# Patient Record
Sex: Female | Born: 1955 | Race: White | Hispanic: No | Smoking: Former smoker
Health system: Southern US, Community
[De-identification: ages and names within clinical notes are randomized; demographics above are authoritative.]

## PROBLEM LIST (undated history)

## (undated) DIAGNOSIS — Z8701 Personal history of pneumonia (recurrent): Secondary | ICD-10-CM

## (undated) DIAGNOSIS — I10 Essential (primary) hypertension: Secondary | ICD-10-CM

## (undated) DIAGNOSIS — K219 Gastro-esophageal reflux disease without esophagitis: Secondary | ICD-10-CM

## (undated) DIAGNOSIS — F419 Anxiety disorder, unspecified: Secondary | ICD-10-CM

## (undated) DIAGNOSIS — M1990 Unspecified osteoarthritis, unspecified site: Secondary | ICD-10-CM

## (undated) DIAGNOSIS — J449 Chronic obstructive pulmonary disease, unspecified: Secondary | ICD-10-CM

## (undated) DIAGNOSIS — R519 Headache, unspecified: Secondary | ICD-10-CM

## (undated) DIAGNOSIS — C3411 Malignant neoplasm of upper lobe, right bronchus or lung: Secondary | ICD-10-CM

## (undated) DIAGNOSIS — J42 Unspecified chronic bronchitis: Secondary | ICD-10-CM

## (undated) DIAGNOSIS — R51 Headache: Secondary | ICD-10-CM

## (undated) DIAGNOSIS — J189 Pneumonia, unspecified organism: Secondary | ICD-10-CM

## (undated) HISTORY — DX: Essential (primary) hypertension: I10

## (undated) HISTORY — PX: HERNIA REPAIR: SHX51

## (undated) HISTORY — DX: Personal history of pneumonia (recurrent): Z87.01

## (undated) HISTORY — DX: Malignant neoplasm of upper lobe, right bronchus or lung: C34.11

## (undated) HISTORY — PX: APPENDECTOMY: SHX54

## (undated) HISTORY — DX: Pneumonia, unspecified organism: J18.9

## (undated) HISTORY — PX: TUBAL LIGATION: SHX77

## (undated) HISTORY — PX: CARPAL TUNNEL RELEASE: SHX101

## (undated) HISTORY — PX: ABDOMINAL HYSTERECTOMY: SHX81

---

## 2016-07-26 ENCOUNTER — Ambulatory Visit (INDEPENDENT_AMBULATORY_CARE_PROVIDER_SITE_OTHER): Payer: Medicaid Other | Admitting: Pulmonary Disease

## 2016-07-26 ENCOUNTER — Other Ambulatory Visit: Payer: Self-pay

## 2016-07-26 ENCOUNTER — Encounter: Payer: Self-pay | Admitting: Pulmonary Disease

## 2016-07-26 ENCOUNTER — Encounter (INDEPENDENT_AMBULATORY_CARE_PROVIDER_SITE_OTHER): Payer: Self-pay

## 2016-07-26 VITALS — BP 112/72 | HR 91 | Ht 60.0 in | Wt 114.0 lb

## 2016-07-26 DIAGNOSIS — R222 Localized swelling, mass and lump, trunk: Secondary | ICD-10-CM

## 2016-07-26 DIAGNOSIS — R918 Other nonspecific abnormal finding of lung field: Secondary | ICD-10-CM

## 2016-07-26 DIAGNOSIS — J432 Centrilobular emphysema: Secondary | ICD-10-CM

## 2016-07-26 DIAGNOSIS — Z720 Tobacco use: Secondary | ICD-10-CM

## 2016-07-26 NOTE — Assessment & Plan Note (Signed)
Large 7cm hilar mass with 2 cm peritracheal node and 2 cm subcarinal node. Plan: Plan: It is good to meet you today  We will schedule you for a PET scan once you have completed your antibiotic treatment. Continue your Levaquin until gone. Continue using your Pro Air Inhaler for shortness of breath or wheezing as needed up to every 6 hours. Continue to work on quitting smoking. Follow up with Dr. Halford Chessman in  4 weeks with Dr. Halford Chessman or Aveleen Nevers Please contact office for sooner follow up if symptoms do not improve or worsen or seek emergency care

## 2016-07-26 NOTE — Patient Instructions (Addendum)
It is good to meet you today  We will schedule you for a PET scan once you have completed your antibiotic treatment. Continue your Levaquin until gone. Continue using your Pro Air Inhaler for shortness of breath or wheezing as needed up to every 6 hours. Continue to work on quitting smoking. Follow up with Dr. Halford Chessman in  4 weeks with Dr. Halford Chessman or Tamyrah Burbage Please contact office for sooner follow up if symptoms do not improve or worsen or seek emergency care

## 2016-07-26 NOTE — Progress Notes (Signed)
Subjective:    Patient ID: Emily Pope, female    DOB: 1956/02/26, 60 y.o.   MRN: [id number]   Pt. Is a 60 year old thin female current smoker with a 63 pack year history. She is currently smoking 1 PPD.   HPI: Pt. Presents to the office today for consultation for a large 7 cm Hilar pulmonary mass, a 2 cm peri tracheal node, and a 2 cm sub carinal node noted on CT scan 07/22/2016. She was also diagnosed 07/20/16 with post obstructive pneumonia.She was diagnosed with COPD by her PCP about 1.5 years ago.She stated that she had a spontaneous pneumothorax in her 62's. She has  had frequent bouts of bronchitis that have been  treated with steroids and antibiotics. She has been maintained on  Dynegy with occasional use of Breo or Advair during  acute flares.. She became ill last Tuesday 07/20/16, and went to see her PCP. She sent her for a CXR on Wednesday, which indicated pneumonia. She was also sent for a CT Chest which indicated the large 7 cm pulmonary mass. She was started on Levaquin 07/20/2016, and will continue to take this for 10 days. She has a cough that is productive for creamy yellow secretions. She states she has a low grade fever. She states she does have chest pain on inhalation. She can sleep on her left side only. She has not experienced hemoptysis. We will schedule her for a PET scan and refer her for EBUS with Dr. Lamonte Sakai.    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 She does not have insurance as of Wednesday and is very concerned about this in regard to her future  care needs.       Review of Systems  Constitutional: Positive for fever and unexpected weight change.  HENT: Positive for congestion, postnasal drip, rhinorrhea and sore throat. Negative for dental problem, ear pain, nosebleeds, sinus pressure, sneezing and trouble swallowing.   Eyes: Negative.  Negative for redness and itching.  Respiratory: Positive for cough, chest tightness, shortness of breath and  wheezing.   Cardiovascular: Negative.  Negative for palpitations and leg swelling.  Gastrointestinal: Negative.  Negative for nausea and vomiting.  Endocrine: Negative.   Genitourinary: Negative.  Negative for dysuria.  Musculoskeletal: Positive for arthralgias, back pain and neck pain. Negative for joint swelling.  Skin: Negative.  Negative for rash.  Allergic/Immunologic: Positive for environmental allergies.  Neurological: Negative.  Negative for headaches.  Hematological: Negative.  Does not bruise/bleed easily.  Psychiatric/Behavioral: Negative.  Negative for dysphoric mood. The patient is not nervous/anxious.    Family History of Brain cancer with metastasis to the lung. ( Mother) + Smoker Family History of Lung Cancer in  Brother + smoker. Family History of diabetes ( Grandparents)  Social History   Social History  . Marital status: Single    Spouse name: N/A  . Number of children: N/A  . Years of education: N/A   Social History Main Topics  . Smoking status: Former Smoker    Packs/day: 2.00    Years: 43.00    Quit date: 07/20/2016  . Smokeless tobacco: None  . Alcohol use None  . Drug use: Unknown  . Sexual activity: Not Asked   Other Topics Concern  . None   Social History Narrative  . None       Objective:   Physical Exam  General- No distress,  A&Ox3, thin, anxious ENT: No sinus tenderness, TM clear,  pale nasal mucosa, no oral exudate,no post nasal drip, no LAN Cardiac: S1, S2, regular rate and rhythm, no murmur Chest: No wheeze/ rales/ dullness; no accessory muscle use, no nasal flaring, no sternal retractions Abd.: Soft Non-tender Ext: No clubbing cyanosis, edema Neuro:  normal strength Skin: No rashes, warm and dry Psych: normal mood and behavior        Assessment & Plan:   Large 7cm hilar mass with 2 cm peritracheal node and 2 cm subcarinal node. Plan: Plan: It is good to meet you today  We will schedule you for a PET scan once you have  completed your antibiotic treatment. Continue your Levaquin until gone. Continue using your Pro Air Inhaler for shortness of breath or wheezing as needed up to every 6 hours. Continue to work on quitting smoking. Follow up with Dr. Halford Chessman in  4 weeks with Dr. Halford Chessman or Sarah Please contact office for sooner follow up if symptoms do not improve or worsen or seek emergency care     Attending note: 60 yo female smoker developed productive cough and right sided pleuritic chest pain.  She had CXR and CT chest which showed Rt upper lung mass and infiltrate with paratracheal and subcarinal LAN.  She has few more days of Abx left.    Thin.  Poor dentition.  MP 2.  No LAN.  HR regular.  Scattered crackles Rt upper lung region, no wheeze.  Abd soft.  No edema.  Normal strength.  CN intact.  Assessment/plan:  Rt upper lung mass with post-obstructive pneumonia and mediastinal LAN. - complete Abx - arrange for PET scan and then determine best approach for biopsy >> likely EBUS with ENB  Emphysema. - will need PFTs when pneumonia improved - continue prn albuterol for now  Tobacco abuse. - she quit smoking on 07/20/16 >> encouraged her to maintain smoking cessation  Chesley Mires, MD [place] 07/26/2016, 4:51 PM Pager:  [phone] After 3pm call: [phone]

## 2016-08-04 ENCOUNTER — Encounter (HOSPITAL_COMMUNITY)
Admission: RE | Admit: 2016-08-04 | Discharge: 2016-08-04 | Disposition: A | Discharge status: Home / Self Care | Payer: Medicaid Other | Source: Ambulatory Visit | Attending: Pulmonary Disease | Admitting: Pulmonary Disease

## 2016-08-04 ENCOUNTER — Encounter (HOSPITAL_COMMUNITY): Payer: Self-pay | Admitting: Radiology

## 2016-08-04 DIAGNOSIS — R222 Localized swelling, mass and lump, trunk: Secondary | ICD-10-CM | POA: Insufficient documentation

## 2016-08-04 DIAGNOSIS — R918 Other nonspecific abnormal finding of lung field: Secondary | ICD-10-CM

## 2016-08-04 LAB — GLUCOSE, CAPILLARY: GLUCOSE-CAPILLARY: 101 mg/dL — AB (ref 65–99)

## 2016-08-04 MED ORDER — FLUDEOXYGLUCOSE F - 18 (FDG) INJECTION
6.0500 | Freq: Once | INTRAVENOUS | Status: AC | PRN
  Administered 2016-08-04: 6.05 via INTRAVENOUS

## 2016-08-06 ENCOUNTER — Telehealth: Payer: Self-pay | Admitting: Pulmonary Disease

## 2016-08-06 DIAGNOSIS — R918 Other nonspecific abnormal finding of lung field: Secondary | ICD-10-CM

## 2016-08-06 DIAGNOSIS — R590 Localized enlarged lymph nodes: Secondary | ICD-10-CM

## 2016-08-06 NOTE — Telephone Encounter (Signed)
PET scan 02/12/97 >> hypermetabolic mass 7.9 cm RUL with narrowing of Rt upper lobe bronchus, several RLL nodules up to 7 mm, 5 mm LLL nodule, 1.2 cm Rt paratracheal LAN SUV 4.2, 1.2 cm subcarinal LAN SUV 5.2, Lt hilar LAN 0.8 cm SUV 4.8, moderate centrilobular emphysema, post-obstructive pneumonia   Attempted to contact pt at listed number >> busy signal.  Will try to call again later.

## 2016-08-09 NOTE — Telephone Encounter (Signed)
Need to set up bronchoscopy with fluoro + EBUS. Discussed case w Dr Halford Chessman.

## 2016-08-09 NOTE — Telephone Encounter (Signed)
Bronch with ebus and floro scheduled 08/23/16'@7'$ :30am '@wlh'$  is this ok?

## 2016-08-09 NOTE — Telephone Encounter (Signed)
Spoke with pt about PET scan results.   Case reviewed with Dr. Lamonte Sakai >> will route message to Dr. Lamonte Sakai to arrange for EBUS.

## 2016-08-10 NOTE — Telephone Encounter (Signed)
Pt is aware of appt and she will be called by short stay with further instructions Joellen Jersey

## 2016-08-10 NOTE — Telephone Encounter (Signed)
Yes this is OK. I have placed on my calendar. Please let pt know and close phone note. Thanks./

## 2016-08-12 ENCOUNTER — Encounter (HOSPITAL_COMMUNITY): Payer: Self-pay | Admitting: *Deleted

## 2016-08-23 ENCOUNTER — Ambulatory Visit (HOSPITAL_COMMUNITY)
Admission: RE | Admit: 2016-08-23 | Discharge: 2016-08-23 | Disposition: A | Discharge status: Home / Self Care | Payer: Medicaid Other | Source: Ambulatory Visit | Attending: Emergency Medicine | Admitting: Emergency Medicine

## 2016-08-23 ENCOUNTER — Ambulatory Visit (HOSPITAL_COMMUNITY): Payer: Medicaid Other | Admitting: Certified Registered Nurse Anesthetist

## 2016-08-23 ENCOUNTER — Encounter (HOSPITAL_COMMUNITY): Payer: Self-pay

## 2016-08-23 ENCOUNTER — Encounter (HOSPITAL_COMMUNITY)
Admission: RE | Disposition: A | Discharge status: Home / Self Care | Payer: Self-pay | Source: Ambulatory Visit | Attending: Emergency Medicine

## 2016-08-23 DIAGNOSIS — I2510 Atherosclerotic heart disease of native coronary artery without angina pectoris: Secondary | ICD-10-CM | POA: Insufficient documentation

## 2016-08-23 DIAGNOSIS — R599 Enlarged lymph nodes, unspecified: Secondary | ICD-10-CM

## 2016-08-23 DIAGNOSIS — Z87891 Personal history of nicotine dependence: Secondary | ICD-10-CM | POA: Insufficient documentation

## 2016-08-23 DIAGNOSIS — J189 Pneumonia, unspecified organism: Secondary | ICD-10-CM | POA: Insufficient documentation

## 2016-08-23 DIAGNOSIS — I10 Essential (primary) hypertension: Secondary | ICD-10-CM | POA: Insufficient documentation

## 2016-08-23 DIAGNOSIS — I700 Atherosclerosis of aorta: Secondary | ICD-10-CM | POA: Insufficient documentation

## 2016-08-23 DIAGNOSIS — J449 Chronic obstructive pulmonary disease, unspecified: Secondary | ICD-10-CM | POA: Diagnosis not present

## 2016-08-23 DIAGNOSIS — C3411 Malignant neoplasm of upper lobe, right bronchus or lung: Secondary | ICD-10-CM

## 2016-08-23 DIAGNOSIS — R590 Localized enlarged lymph nodes: Secondary | ICD-10-CM | POA: Insufficient documentation

## 2016-08-23 DIAGNOSIS — R918 Other nonspecific abnormal finding of lung field: Secondary | ICD-10-CM

## 2016-08-23 DIAGNOSIS — M1990 Unspecified osteoarthritis, unspecified site: Secondary | ICD-10-CM | POA: Insufficient documentation

## 2016-08-23 HISTORY — DX: Headache: R51

## 2016-08-23 HISTORY — DX: Headache, unspecified: R51.9

## 2016-08-23 HISTORY — DX: Chronic obstructive pulmonary disease, unspecified: J44.9

## 2016-08-23 HISTORY — DX: Gastro-esophageal reflux disease without esophagitis: K21.9

## 2016-08-23 HISTORY — DX: Unspecified chronic bronchitis: J42

## 2016-08-23 HISTORY — DX: Unspecified osteoarthritis, unspecified site: M19.90

## 2016-08-23 HISTORY — DX: Anxiety disorder, unspecified: F41.9

## 2016-08-23 HISTORY — DX: Malignant neoplasm of upper lobe, right bronchus or lung: C34.11

## 2016-08-23 HISTORY — PX: ENDOBRONCHIAL ULTRASOUND: SHX5096

## 2016-08-23 LAB — POCT I-STAT 4, (NA,K, GLUC, HGB,HCT)
Glucose, Bld: 82 mg/dL (ref 65–99)
HEMATOCRIT: 36 % (ref 36.0–46.0)
HEMOGLOBIN: 12.2 g/dL (ref 12.0–15.0)
POTASSIUM: 4.2 mmol/L (ref 3.5–5.1)
SODIUM: 143 mmol/L (ref 135–145)

## 2016-08-23 SURGERY — ENDOBRONCHIAL ULTRASOUND (EBUS)
Anesthesia: General | Laterality: Bilateral

## 2016-08-23 MED ORDER — PROPOFOL 10 MG/ML IV BOLUS
INTRAVENOUS | Status: AC
  Filled 2016-08-23: qty 20

## 2016-08-23 MED ORDER — FENTANYL CITRATE (PF) 100 MCG/2ML IJ SOLN
INTRAMUSCULAR | Status: DC | PRN
  Administered 2016-08-23: 25 ug via INTRAVENOUS
  Administered 2016-08-23: 50 ug via INTRAVENOUS
  Administered 2016-08-23: 25 ug via INTRAVENOUS

## 2016-08-23 MED ORDER — MIDAZOLAM HCL 2 MG/2ML IJ SOLN
INTRAMUSCULAR | Status: AC
  Filled 2016-08-23: qty 2

## 2016-08-23 MED ORDER — PHENYLEPHRINE HCL 10 MG/ML IJ SOLN
INTRAMUSCULAR | Status: DC | PRN
  Administered 2016-08-23 (×2): 80 ug via INTRAVENOUS
  Administered 2016-08-23: 40 ug via INTRAVENOUS
  Administered 2016-08-23 (×3): 80 ug via INTRAVENOUS
  Administered 2016-08-23: 120 ug via INTRAVENOUS
  Administered 2016-08-23: 80 ug via INTRAVENOUS

## 2016-08-23 MED ORDER — LIDOCAINE HCL (CARDIAC) 20 MG/ML IV SOLN
INTRAVENOUS | Status: DC | PRN
  Administered 2016-08-23: 50 mg via INTRAVENOUS

## 2016-08-23 MED ORDER — EPHEDRINE SULFATE 50 MG/ML IJ SOLN
INTRAMUSCULAR | Status: AC
  Filled 2016-08-23: qty 1

## 2016-08-23 MED ORDER — FENTANYL CITRATE (PF) 100 MCG/2ML IJ SOLN
INTRAMUSCULAR | Status: AC
  Filled 2016-08-23: qty 2

## 2016-08-23 MED ORDER — EPHEDRINE SULFATE 50 MG/ML IJ SOLN
INTRAMUSCULAR | Status: DC | PRN
  Administered 2016-08-23: 5 mg via INTRAVENOUS
  Administered 2016-08-23: 10 mg via INTRAVENOUS

## 2016-08-23 MED ORDER — LACTATED RINGERS IV SOLN
INTRAVENOUS | Status: DC | PRN
  Administered 2016-08-23 (×2): via INTRAVENOUS

## 2016-08-23 MED ORDER — SODIUM CHLORIDE 0.9 % IJ SOLN
INTRAMUSCULAR | Status: AC
  Filled 2016-08-23: qty 10

## 2016-08-23 MED ORDER — SUCCINYLCHOLINE CHLORIDE 20 MG/ML IJ SOLN
INTRAMUSCULAR | Status: DC | PRN
  Administered 2016-08-23: 100 mg via INTRAVENOUS

## 2016-08-23 MED ORDER — PROPOFOL 10 MG/ML IV BOLUS
INTRAVENOUS | Status: DC | PRN
  Administered 2016-08-23: 100 mg via INTRAVENOUS
  Administered 2016-08-23: 30 mg via INTRAVENOUS

## 2016-08-23 MED ORDER — LIDOCAINE HCL (CARDIAC) 20 MG/ML IV SOLN
INTRAVENOUS | Status: AC
  Filled 2016-08-23: qty 5

## 2016-08-23 MED ORDER — ONDANSETRON HCL 4 MG/2ML IJ SOLN
INTRAMUSCULAR | Status: AC
  Filled 2016-08-23: qty 2

## 2016-08-23 MED ORDER — DEXAMETHASONE SODIUM PHOSPHATE 10 MG/ML IJ SOLN
INTRAMUSCULAR | Status: DC | PRN
  Administered 2016-08-23: 10 mg via INTRAVENOUS

## 2016-08-23 MED ORDER — ONDANSETRON HCL 4 MG/2ML IJ SOLN
INTRAMUSCULAR | Status: DC | PRN
Start: 2016-08-23 — End: 2016-08-23
  Administered 2016-08-23: 4 mg via INTRAVENOUS

## 2016-08-23 MED ORDER — MIDAZOLAM HCL 5 MG/5ML IJ SOLN
INTRAMUSCULAR | Status: DC | PRN
  Administered 2016-08-23: 2 mg via INTRAVENOUS

## 2016-08-23 NOTE — Op Note (Signed)
[facility] Cardiopulmonary Patient Name: Emily Pope Procedure Date: 08/23/2016 MRN: [id number] Attending MD: Collene Gobble , MD Date of Birth: 1956/07/11 CSN: [id number] Age: 60 Admit Type: Outpatient Ethnicity: Not Hispanic or Latino Procedure:            Bronchoscopy Indications:          Right upper lobe mass, Bilateral hilar lymphadenopathy,                        Mediastinal adenopathy Providers:            Collene Gobble, MD, Cleda Daub, RN, Elspeth Cho                        Tech., Technician, Christell Faith, CRNA Referring MD:          Medicines:            General Anesthesia Complications:        No immediate complications Estimated Blood Loss: Estimated blood loss was minimal. Procedure:      Pre-Anesthesia Assessment:      - A History and Physical has been performed. Patient meds and allergies       have been reviewed. The risks and benefits of the procedure and the       sedation options and risks were discussed with the patient. All       questions were answered and informed consent was obtained. Patient       identification and proposed procedure were verified prior to the       procedure by the physician in the pre-procedure area. Mental Status       Examination: alert and oriented. Airway Examination: normal       oropharyngeal airway. Respiratory Examination: rhonchi. CV Examination:       RRR, no murmurs, no S3 or S4. ASA Grade Assessment: II - A patient with       mild systemic disease. After reviewing the risks and benefits, the       patient was deemed in satisfactory condition to undergo the procedure.       The anesthesia plan was to use deep sedation / analgesia. Immediately       prior to administration of medications, the patient was re-assessed for       adequacy to receive sedatives. The heart rate, respiratory rate, oxygen       saturations, blood pressure, adequacy of pulmonary ventilation, and       response to care were  monitored throughout the procedure. The physical       status of the patient was re-assessed after the procedure.      After obtaining informed consent, the bronchoscope was passed under       direct vision. Throughout the procedure, the patient's blood pressure,       pulse, and oxygen saturations were monitored continuously. the [id number]       ( [id number]) scope was introduced through the mouth, via the endotracheal       tube (the patient was intubated for the procedure) and advanced to the       tracheobronchial tree. The procedure was accomplished without       difficulty. The patient tolerated the procedure well. Findings:      The trachea is of normal caliber. The carina is sharp. The entire       tracheobronchial tree was examined to at least  the first subsegmental       level. Bronchial mucosa and anatomy are normal; there are no       endobronchial lesions and no secretions, except in the posterior segment       of the right upper lobe.      Right Lung Abnormalities: A partially obstructing (about 80% obstructed)       mass was found proximally, at the orifice in the posterior segment of       the right upper lobe (B2). The mass was medium-sized and exophytic and       friable. The lesion was not traversed.      Brushings of the mass were obtained in the posterior segment of the       right upper lobe with a cytology brush and sent for routine cytology.       Three samples were obtained.      Endobronchial biopsies of a mass were performed in the posterior segment       of the right upper lobe using a forceps and sent for histopathology       examination. Five samples were obtained.      Lymph Nodes: Endobronchial ultrasound was used to systematically examine       the right lower paratracheal region (level 4R), left lower paratracheal       region (level 4L), subcarinal mediastinum (level 7) and left interlobar       region (level 11L) in order to assist with fine needle  aspiration. An       endobronchial ultrasound-guided transbronchial needle aspiration was       performed using a Wang 21 gauge needle and sent for routine cytology.       Specimens were obtained from the 4R (lower paratracheal), 4L (lower       paratracheal), 7 (subcarinal) and 11L (interlobar) lymph nodes. Five       passes per node were performed. Rapid On-Site Evaluation (ROSE):       Preliminary cytology was suggestive of benign-appearing lymphoid tissue       (final results are pending) in the left lower paratracheal region (level       4L) and left interlobar region (level 11L). The patient's condition was       unchanged after the intervention. Estimated blood loss: minimal. Impression:      - Right upper lobe mass      - Bilateral hilar lymphadenopathy      - Mediastinal adenopathy      - An exophytic and friable mass was found in the posterior segment of       the right upper lobe (B2). 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      - Endobronchial ultrasound was performed.      - A transbronchial needle aspiration was performed.      - Rapid On-Site Evaluation (ROSE): Preliminary cytology was suggestive       of benign-appearing lymphoid tissue in node level 4L and node level 11L       (final results are pending).      - The patient's condition was unchanged after the intervention. Moderate Sedation:      General Anesthesia Recommendation:      - Await cytology results.      - Follow up in clinic as previously scheduled. Procedure Code(s):      --- Professional ---      [phone], Bronchoscopy, rigid or flexible, including fluoroscopic guidance,  when performed; with endobronchial ultrasound (EBUS) guided       transtracheal and/or transbronchial sampling (eg,       aspiration[s]/biopsy[ies]), 3 or more mediastinal and/or hilar lymph       node stations or structures      91916, Bronchoscopy, rigid or flexible, including fluoroscopic guidance,       when performed; with bronchial or  endobronchial biopsy(s), single or       multiple sites      31623, Bronchoscopy, rigid or flexible, including fluoroscopic guidance,       when performed; with brushing or protected brushings      31654, Bronchoscopy, rigid or flexible, including fluoroscopic guidance,       when performed; with transendoscopic endobronchial ultrasound (EBUS)       during bronchoscopic diagnostic or therapeutic intervention(s) for       peripheral lesion(s) (List separately in addition to code for primary       procedure[s]) Diagnosis Code(s):      --- Professional ---      R91.8, Other nonspecific abnormal finding of lung field      R59.9, Enlarged lymph nodes, unspecified      C34.11, Malignant neoplasm of upper lobe, right bronchus or lung CPT copyright 2016 American Medical Association. All rights reserved. The codes documented in this report are preliminary and upon coder review may  be revised to meet current compliance requirements. Collene Gobble, MD Collene Gobble, MD 08/23/2016 9:35:26 AM Number of Addenda: 0 Scope In: Scope Out:

## 2016-08-23 NOTE — Progress Notes (Signed)
Video Bronchoscopy done post EBUS procedure  Intervention Bronchial brushing done Intervention Bronchial  Biopsy done

## 2016-08-23 NOTE — Anesthesia Postprocedure Evaluation (Signed)
Anesthesia Post Note  Patient: Emily Pope  Procedure(s) Performed: Procedure(s) (LRB): ENDOBRONCHIAL ULTRASOUND (Bilateral)  Patient location during evaluation: PACU Anesthesia Type: General Pain management: pain level controlled Respiratory status: spontaneous breathing Cardiovascular status: stable Anesthetic complications: no    Last Vitals:  Vitals:   08/23/16 0950 08/23/16 1030  BP: 138/76 126/75  Pulse:    Resp:    Temp:      Last Pain:  Vitals:   08/23/16 0936  TempSrc: Oral                 EDWARDS,Aravind Chrismer

## 2016-08-23 NOTE — H&P (View-Only) (Signed)
Subjective:    Patient ID: Elspeth Blucher, female    DOB: December 12, 1956, 60 y.o.   MRN: [id number]   Pt. Is a 60 year old thin female current smoker with a 63 pack year history. She is currently smoking 1 PPD.   HPI: Pt. Presents to the office today for consultation for a large 7 cm Hilar pulmonary mass, a 2 cm peri tracheal node, and a 2 cm sub carinal node noted on CT scan 07/22/2016. She was also diagnosed 07/20/16 with post obstructive pneumonia.She was diagnosed with COPD by her PCP about 1.5 years ago.She stated that she had a spontaneous pneumothorax in her 60's. She has  had frequent bouts of bronchitis that have been  treated with steroids and antibiotics. She has been maintained on  Dynegy with occasional use of Breo or Advair during  acute flares.. She became ill last Tuesday 07/20/16, and went to see her PCP. She sent her for a CXR on Wednesday, which indicated pneumonia. She was also sent for a CT Chest which indicated the large 7 cm pulmonary mass. She was started on Levaquin 07/20/2016, and will continue to take this for 10 days. She has a cough that is productive for creamy yellow secretions. She states she has a low grade fever. She states she does have chest pain on inhalation. She can sleep on her left side only. She has not experienced hemoptysis. We will schedule her for a PET scan and refer her for EBUS with Dr. Lamonte Sakai.    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 She does not have insurance as of Wednesday and is very concerned about this in regard to her future  care needs.       Review of Systems  Constitutional: Positive for fever and unexpected weight change.  HENT: Positive for congestion, postnasal drip, rhinorrhea and sore throat. Negative for dental problem, ear pain, nosebleeds, sinus pressure, sneezing and trouble swallowing.   Eyes: Negative.  Negative for redness and itching.  Respiratory: Positive for cough, chest tightness, shortness of breath and  wheezing.   Cardiovascular: Negative.  Negative for palpitations and leg swelling.  Gastrointestinal: Negative.  Negative for nausea and vomiting.  Endocrine: Negative.   Genitourinary: Negative.  Negative for dysuria.  Musculoskeletal: Positive for arthralgias, back pain and neck pain. Negative for joint swelling.  Skin: Negative.  Negative for rash.  Allergic/Immunologic: Positive for environmental allergies.  Neurological: Negative.  Negative for headaches.  Hematological: Negative.  Does not bruise/bleed easily.  Psychiatric/Behavioral: Negative.  Negative for dysphoric mood. The patient is not nervous/anxious.    Family History of Brain cancer with metastasis to the lung. ( Mother) + Smoker Family History of Lung Cancer in  Brother + smoker. Family History of diabetes ( Grandparents)  Social History   Social History  . Marital status: Single    Spouse name: N/A  . Number of children: N/A  . Years of education: N/A   Social History Main Topics  . Smoking status: Former Smoker    Packs/day: 2.00    Years: 43.00    Quit date: 07/20/2016  . Smokeless tobacco: None  . Alcohol use None  . Drug use: Unknown  . Sexual activity: Not Asked   Other Topics Concern  . None   Social History Narrative  . None       Objective:   Physical Exam  General- No distress,  A&Ox3, thin, anxious ENT: No sinus tenderness, TM clear,  pale nasal mucosa, no oral exudate,no post nasal drip, no LAN Cardiac: S1, S2, regular rate and rhythm, no murmur Chest: No wheeze/ rales/ dullness; no accessory muscle use, no nasal flaring, no sternal retractions Abd.: Soft Non-tender Ext: No clubbing cyanosis, edema Neuro:  normal strength Skin: No rashes, warm and dry Psych: normal mood and behavior        Assessment & Plan:   Large 7cm hilar mass with 2 cm peritracheal node and 2 cm subcarinal node. Plan: Plan: It is good to meet you today  We will schedule you for a PET scan once you have  completed your antibiotic treatment. Continue your Levaquin until gone. Continue using your Pro Air Inhaler for shortness of breath or wheezing as needed up to every 6 hours. Continue to work on quitting smoking. Follow up with Dr. Halford Chessman in  4 weeks with Dr. Halford Chessman or Sarah Please contact office for sooner follow up if symptoms do not improve or worsen or seek emergency care     Attending note: 60 yo female smoker developed productive cough and right sided pleuritic chest pain.  She had CXR and CT chest which showed Rt upper lung mass and infiltrate with paratracheal and subcarinal LAN.  She has few more days of Abx left.    Thin.  Poor dentition.  MP 2.  No LAN.  HR regular.  Scattered crackles Rt upper lung region, no wheeze.  Abd soft.  No edema.  Normal strength.  CN intact.  Assessment/plan:  Rt upper lung mass with post-obstructive pneumonia and mediastinal LAN. - complete Abx - arrange for PET scan and then determine best approach for biopsy >> likely EBUS with ENB  Emphysema. - will need PFTs when pneumonia improved - continue prn albuterol for now  Tobacco abuse. - she quit smoking on 07/20/16 >> encouraged her to maintain smoking cessation  Chesley Mires, MD [place] 07/26/2016, 4:51 PM Pager:  [phone] After 3pm call: [phone]

## 2016-08-23 NOTE — Anesthesia Preprocedure Evaluation (Addendum)
Anesthesia Evaluation  Patient identified by MRN, date of birth, ID band Patient awake    Reviewed: Allergy & Precautions, NPO status , Patient's Chart, lab work & pertinent test results  Airway Mallampati: II  TM Distance: >3 FB     Dental   Pulmonary pneumonia, COPD, former smoker,  History noted. CE    breath sounds clear to auscultation       Cardiovascular hypertension,  Rhythm:Regular Rate:Normal     Neuro/Psych    GI/Hepatic Neg liver ROS, GERD  ,  Endo/Other  negative endocrine ROS  Renal/GU negative Renal ROS     Musculoskeletal  (+) Arthritis ,   Abdominal   Peds  Hematology   Anesthesia Other Findings   Reproductive/Obstetrics                            Anesthesia Physical Anesthesia Plan  ASA: III  Anesthesia Plan: General   Post-op Pain Management:    Induction: Intravenous  Airway Management Planned: Oral ETT  Additional Equipment:   Intra-op Plan:   Post-operative Plan: Possible Post-op intubation/ventilation  Informed Consent:   Dental advisory given  Plan Discussed with: Anesthesiologist and CRNA  Anesthesia Plan Comments:         Anesthesia Quick Evaluation

## 2016-08-23 NOTE — Interval H&P Note (Signed)
PCCM INterval Note  61 yo smoker with newly identified RUL apical-posterior mass with associated post-obstructive PNA, R and L hilar and mediastinal adenopathy. She presents today for tissue dx. Recommendation made to perform EBUS to allow nodal sampling and staging information. Pt understands the procedure benefits and risks. No new issues or meds since her OV w Dr Halford Chessman.   Vitals:   08/23/16 0644  BP: (!) 150/82  Pulse: 75  Resp: 15  Temp: 97.8 F (36.6 C)  TempSrc: Oral  SpO2: 100%  Weight: 51.7 kg (114 lb)  Height: 5' (1.524 m)   Gen: Pleasant, well-nourished, in no distress,  normal affect  ENT: No lesions,  mouth clear,  oropharynx clear, no postnasal drip, poor dentition  Neck: No JVD, no TMG, no carotid bruits, no stridor  Lungs: No use of accessory muscles, focal B upper lobe insp squeaks, no wheeze  Cardiovascular: RRR, heart sounds normal, no murmur or gallops, no peripheral edema  Musculoskeletal: No deformities, no cyanosis or clubbing  Neuro: alert, non focal  Skin: Warm, no lesions or rashes   PET scan 08/04/16:  IMPRESSION: 1. 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 Mass is confluent with the right superior hilum and right lower tracheal wall, suggestive of a T4 primary tumor. 2. Postobstructive pneumonia in the right upper lobe. 3. Hypermetabolic ipsilateral and contralateral mediastinal and contralateral hilar lymphadenopathy, suggestive of N3 nodal disease. 4. Bilateral lower lobe pulmonary nodules, including a 5 mm contralateral lower lobe pulmonary nodule, cannot exclude pulmonary metastases. 5. No osseous or extrathoracic metastatic disease. 6. Aortic atherosclerosis.  One vessel coronary atherosclerosis.  Plans:  Will proceed with FOB + EBUS for RUL and nodal bx's  Baltazar Apo, MD, PhD 08/23/2016,  7:42 AM [facility] [phone] or if no answer [phone]

## 2016-08-23 NOTE — Transfer of Care (Signed)
Immediate Anesthesia Transfer of Care Note  Patient: Emily Pope  Procedure(s) Performed: Procedure(s): ENDOBRONCHIAL ULTRASOUND (Bilateral)  Patient Location: PACU  Anesthesia Type:General  Level of Consciousness:  sedated, patient cooperative and responds to stimulation  Airway & Oxygen Therapy:Patient Spontanous Breathing and Patient connected to face mask oxgen  Post-op Assessment:  Report given to PACU RN and Post -op Vital signs reviewed and stable  Post vital signs:  Reviewed and stable  Last Vitals:  Vitals:   08/23/16 0644  BP: (!) 150/82  Pulse: 75  Resp: 15  Temp: 36.6 C    Complications: No apparent anesthesia complications

## 2016-08-23 NOTE — Anesthesia Procedure Notes (Signed)
Procedure Name: Intubation Date/Time: 08/23/2016 7:54 AM Performed by: West Pugh Pre-anesthesia Checklist: Patient identified, Emergency Drugs available, Suction available, Patient being monitored and Timeout performed Patient Re-evaluated:Patient Re-evaluated prior to inductionOxygen Delivery Method: Circle system utilized Preoxygenation: Pre-oxygenation with 100% oxygen Intubation Type: IV induction Ventilation: Mask ventilation without difficulty Laryngoscope Size: Mac and 4 Grade View: Grade II Tube type: Oral Tube size: 8.0 mm Number of attempts: 2 Airway Equipment and Method: Stylet Placement Confirmation: ETT inserted through vocal cords under direct vision,  positive ETCO2,  CO2 detector and breath sounds checked- equal and bilateral Secured at: 22 cm Tube secured with: Tape Dental Injury: Teeth and Oropharynx as per pre-operative assessment  Comments: Attempt x 2. 8.5 would not pass, thus switched to 8.0 and passed successfully.

## 2016-08-23 NOTE — Discharge Instructions (Signed)
Flexible Bronchoscopy, Care After °These instructions give you information on caring for yourself after your procedure. Your doctor may also give you more specific instructions. Call your doctor if you have any problems or questions after your procedure. °HOME CARE °· Do not eat or drink anything for 2 hours after your procedure. If you try to eat or drink before the medicine wears off, food or drink could go into your lungs. You could also burn yourself. °· After 2 hours have passed and when you can cough and gag normally, you may eat soft food and drink liquids slowly. °· The day after the test, you may eat your normal diet. °· You may do your normal activities. °· Keep all doctor visits. °GET HELP RIGHT AWAY IF: °· You get more and more short of breath. °· You get light-headed. °· You feel like you are going to pass out (faint). °· You have chest pain. °· You have new problems that worry you. °· You cough up more than a little blood. °· You cough up more blood than before. °MAKE SURE YOU: °· Understand these instructions. °· Will watch your condition. °· Will get help right away if you are not doing well or get worse. °  °This information is not intended to replace advice given to you by your health care provider. Make sure you discuss any questions you have with your health care provider. ° °Please call our office for any questions or concerns. [phone].  °  °Document Released: 10/10/2009 Document Revised: 12/18/2013 Document Reviewed: 08/17/2013 °Elsevier Interactive Patient Education ©2016 [employer] ° °

## 2016-08-25 ENCOUNTER — Encounter (HOSPITAL_COMMUNITY): Payer: Self-pay | Admitting: Emergency Medicine

## 2016-08-26 ENCOUNTER — Encounter: Payer: Self-pay | Admitting: Acute Care

## 2016-08-26 ENCOUNTER — Telehealth: Payer: Self-pay | Admitting: *Deleted

## 2016-08-26 ENCOUNTER — Encounter: Payer: Self-pay | Admitting: *Deleted

## 2016-08-26 ENCOUNTER — Ambulatory Visit (INDEPENDENT_AMBULATORY_CARE_PROVIDER_SITE_OTHER): Payer: Medicaid Other | Admitting: Acute Care

## 2016-08-26 VITALS — BP 122/70 | HR 83 | Ht 60.0 in | Wt 117.6 lb

## 2016-08-26 DIAGNOSIS — R918 Other nonspecific abnormal finding of lung field: Secondary | ICD-10-CM | POA: Diagnosis not present

## 2016-08-26 DIAGNOSIS — Z9889 Other specified postprocedural states: Secondary | ICD-10-CM | POA: Diagnosis not present

## 2016-08-26 DIAGNOSIS — F17210 Nicotine dependence, cigarettes, uncomplicated: Secondary | ICD-10-CM

## 2016-08-26 NOTE — Assessment & Plan Note (Signed)
Recovering from procedure well. No respiratory distress No hemoptysis VSS Plan: Instructed to go to ED if hemoptysis, chest pain, respiratory distress.] Follow up with Dr. Halford Chessman in 3 months. Please contact office for sooner follow up if symptoms do not improve or worsen or seek emergency care

## 2016-08-26 NOTE — Progress Notes (Signed)
History of Present Illness Emily Pope is a 60 y.o. female former 29 pack year smoker ( Quit 4 weeks ago)  with COPD and large 7 cm Hilar mass,  2 cm peri tracheal node, and 2 cm sub carinal node discovered upon diagnosis of post obstructive pneumonia and subsequent CT Chest. She is followed by Dr. Halford Chessman, and Dr. Lamonte Sakai ( [facility]).    08/26/2016 Follow up [facility]  for Pulmonary Mass/Bilateral hilar lymphadenopathy, Mediastinal adenopathy . Pt. Presents to the office today with her significant other for follow up of EBUS / biopsy of central right upper lobe lung mass. Monday 08/23/16. She is doing well. She is recovering well from her procedure. She has rare pale pink secretions, but they are clearing. She had some pain immediately after the procedure, but this is now better. She states that her breathing is fine. Rarely to never has to use her rescue inhaler, and does  not require  maintenance medication for her COPD. She denies fever, chest pain, orthopnea or hemoptysis. We did discuss if she develops bleeding in her lungs she needs to go to the emergency room. She is very anxious and has been since work up for her post obstructive pneumonia started. Both she and her significant other have quit smoking ( 4 weeks ago). Emily Pope is using nicotine replacement therapy ( mints) to help remain smoke free. 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 She is aware that EBUS sampling was negative, but that initial samplings were concerning for squamous cell malignancy.  Tests  PET Scan: 08/04/2016  IMPRESSION: 1. 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 Mass is confluent with the right superior hilum and  right lower tracheal wall, suggestive of a T4 primary tumor. 2. Postobstructive pneumonia in the right upper lobe. 3. Hypermetabolic ipsilateral and contralateral mediastinal and contralateral hilar lymphadenopathy, suggestive of N3 nodal disease. 4. Bilateral lower lobe pulmonary nodules, including a 5 mm contralateral lower lobe pulmonary nodule, cannot exclude pulmonary metastases. 5. No osseous or extrathoracic metastatic disease. 6. Aortic atherosclerosis.  One vessel coronary atherosclerosis.  Past medical hx Past Medical History:  Diagnosis Date  . Anxiety    uses it for menopause   . Arthritis   . Chronic bronchitis ([place])   . COPD (chronic obstructive pulmonary disease) ([place])   . GERD (gastroesophageal reflux disease)   . Headache    sinus  . Hypertension   . Pneumonia    07/22/16-had pneumonia in right lung and found mass in lung     Past surgical hx, Family hx, Social hx all reviewed.  Current Outpatient Prescriptions on File Prior to Visit  Medication Sig  . albuterol (PROVENTIL HFA;VENTOLIN HFA) 108 (90 Base) MCG/ACT inhaler Inhale 2 puffs into the lungs every 6 (six) hours as needed for wheezing or shortness of breath.  Marland Kitchen buPROPion (WELLBUTRIN XL) 150 MG 24 hr tablet Take 1 tablet (150 mg total) by mouth daily.  . diphenhydrAMINE (BENADRYL) 25 MG tablet Take 12.5 mg by mouth daily as needed for allergies.  Marland Kitchen lisinopril-hydrochlorothiazide (PRINZIDE,ZESTORETIC) 20-12.5 MG tablet Take 1 tablet by mouth daily.  Marland Kitchen loratadine (CLARITIN) 10 MG tablet Take 10 mg by mouth daily.  . naproxen (NAPROSYN) 500 MG tablet Take 1 tablet (500 mg total) by mouth 2 (  two) times daily with a meal.  . traMADol (ULTRAM) 50 MG tablet Take 50 mg by mouth every 6 (six) hours as needed for moderate pain.    No current facility-administered medications on file prior to visit.      Allergies  Allergen Reactions  . Codeine Anaphylaxis  . Penicillins Anaphylaxis  . Sulfa Antibiotics  Nausea Only  . Lincomycin Rash    Also Clindamycin as it has lincomycin in it    Review Of Systems:  Constitutional:   No  weight loss, night sweats,  Fevers, chills, fatigue, or  lassitude.  HEENT:   No headaches,  Difficulty swallowing,  Tooth/dental problems, or  Sore throat,                No sneezing, itching, ear ache, nasal congestion, post nasal drip,   CV:  No chest pain,  Orthopnea, PND, swelling in lower extremities, anasarca, dizziness, palpitations, syncope.   GI  No heartburn, indigestion, abdominal pain, nausea, vomiting, diarrhea, change in bowel habits, loss of appetite, bloody stools.   Resp: No shortness of breath with exertion or at rest.  No excess mucus, no productive cough,  No non-productive cough,  No coughing up of blood.  No change in color of mucus.  No wheezing.  No chest wall deformity  Skin: no rash or lesions.  GU: no dysuria, change in color of urine, no urgency or frequency.  No flank pain, no hematuria   MS:  No joint pain or swelling.  No decreased range of motion.  + back pain.  Psych:  No change in mood or affect. No depression + anxiety.  No memory loss.   Vital Signs BP 122/70 (BP Location: Left Arm, Cuff Size: Normal)   Pulse 83   Ht 5' (1.524 m)   Wt 117 lb 9.6 oz (53.3 kg)   LMP 08/04/1985 (Approximate) Comment: hysterectomy @ 60 years old  SpO2 97%   BMI 22.97 kg/m    Physical Exam:  General- No distress,  A&Ox3, thin and anxious. 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  Per Bronch may be stage 1 PET scan suspicious for stage 3  Plan: [place] Referral for further evaluation and treatment. Pt has transportation issues and treatment at [facility], if possible, would be helpful to patient. Follow up with Dr. Halford Chessman in 3 months or sooner if needed. Please contact office for sooner follow up if symptoms do not improve or worsen or seek emergency care    Status post bronchoscopy with biopsy Recovering from procedure well. No respiratory distress No hemoptysis VSS Plan: Instructed to go to ED if hemoptysis, chest pain, respiratory distress.] Follow up with Dr. Halford Chessman in 3 months. Please contact office for sooner follow up if symptoms do not improve or worsen or seek emergency care     Magdalen Spatz, NP 08/26/2016  11:24 AM

## 2016-08-26 NOTE — Progress Notes (Signed)
Reviewed and agree with assessment/plan.  Chesley Mires, MD [facility] Pulmonary/Critical Care 08/26/2016, 11:33 AM Pager:  [phone]

## 2016-08-26 NOTE — Assessment & Plan Note (Addendum)
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  Per Bronch may be stage 1 PET scan suspicious for stage 3  Plan: [place] Referral for further evaluation and treatment. Pt has transportation issues and treatment at [facility], if possible, would be helpful to patient. Follow up with Dr. Halford Chessman in 3 months or sooner if needed. Please contact office for sooner follow up if symptoms do not improve or worsen or seek emergency care

## 2016-08-26 NOTE — Telephone Encounter (Signed)
Oncology Nurse Navigator Documentation  Oncology Nurse Navigator Flowsheets 08/26/2016  Navigator Encounter Type Introductory phone call/I received referral on Ms. Sine today.  I called but was unable to reach.    Abnormal Finding Date 08/04/2016  Confirmed Diagnosis Date 08/23/2016  Treatment Phase Pre-Tx/Tx Discussion  Barriers/Navigation Needs Coordination of Care  Interventions Coordination of Care  Coordination of Care Appts  Acuity Level 1  Time Spent with Patient 15

## 2016-08-26 NOTE — Patient Instructions (Addendum)
It is good to see your today. Congratulations on quitting smoking. We will refer you to the [facility] for further evaluation and treatment  of your lung mass. Continue using you rescue inhaler as needed. Continue using your nicotine replacement therapy to help you remain smoke free. Continue using your Claritin 10 mg once daily. Generic is ok. Follow up with Dr. Lamonte Sakai in 3 months. Please contact office for sooner follow up if symptoms do not improve or worsen or seek emergency care

## 2016-08-26 NOTE — Telephone Encounter (Signed)
Oncology Nurse Navigator Documentation  Oncology Nurse Navigator Flowsheets 08/26/2016  Navigator Encounter Type Introductory phone call;Telephone/I called and spoke with patient.  I gave her an appt for [place] next week on 09/02/16 arrive at 2:30.  She verbalized understanding of appt time and place.   Telephone Outgoing Call  Treatment Phase Pre-Tx/Tx Discussion  Barriers/Navigation Needs Coordination of Care  Interventions Coordination of Care  Coordination of Care Appts  Acuity Level 1  Acuity Level 1 Initial guidance, education and coordination as needed  Time Spent with Patient 15

## 2016-08-27 ENCOUNTER — Telehealth: Payer: Self-pay | Admitting: *Deleted

## 2016-08-27 NOTE — Telephone Encounter (Signed)
Oncology Nurse Navigator Documentation  Oncology Nurse Navigator Flowsheets 08/27/2016  Navigator Encounter Type Telephone/I called Ms. Uppal and asked if she could come to clinic on 09/02/16 30 minutes earlier than her scheduled appt.  She was ok with that.    Telephone Outgoing Call  Treatment Phase Pre-Tx/Tx Discussion  Barriers/Navigation Needs Coordination of Care  Interventions Coordination of Care  Coordination of Care Appts  Acuity Level 1  Acuity Level 1 Minimal follow up required  Time Spent with Patient 15

## 2016-08-31 ENCOUNTER — Telehealth: Payer: Self-pay | Admitting: *Deleted

## 2016-08-31 NOTE — Telephone Encounter (Signed)
Mailed clinic letter to pt.  

## 2016-09-01 ENCOUNTER — Telehealth: Payer: Self-pay | Admitting: *Deleted

## 2016-09-01 NOTE — Telephone Encounter (Signed)
Called and confirmed 09/02/16 clinic appt.

## 2016-09-02 ENCOUNTER — Ambulatory Visit
Admission: RE | Admit: 2016-09-02 | Discharge: 2016-09-02 | Disposition: A | Discharge status: Home / Self Care | Payer: Self-pay | Source: Ambulatory Visit | Attending: Radiation Oncology | Admitting: Radiation Oncology

## 2016-09-02 ENCOUNTER — Encounter: Payer: Self-pay | Admitting: *Deleted

## 2016-09-02 ENCOUNTER — Ambulatory Visit (HOSPITAL_BASED_OUTPATIENT_CLINIC_OR_DEPARTMENT_OTHER): Payer: Medicaid Other | Admitting: Internal Medicine

## 2016-09-02 ENCOUNTER — Ambulatory Visit: Payer: Self-pay | Admitting: Physical Therapy

## 2016-09-02 ENCOUNTER — Other Ambulatory Visit (HOSPITAL_BASED_OUTPATIENT_CLINIC_OR_DEPARTMENT_OTHER): Payer: Medicaid Other

## 2016-09-02 ENCOUNTER — Other Ambulatory Visit: Payer: Self-pay

## 2016-09-02 VITALS — BP 106/73 | HR 89 | Temp 97.5°F | Resp 16 | Ht 60.0 in | Wt 115.7 lb

## 2016-09-02 DIAGNOSIS — C3411 Malignant neoplasm of upper lobe, right bronchus or lung: Secondary | ICD-10-CM

## 2016-09-02 DIAGNOSIS — R918 Other nonspecific abnormal finding of lung field: Secondary | ICD-10-CM

## 2016-09-02 LAB — CBC WITH DIFFERENTIAL/PLATELET
BASO%: 1 % (ref 0.0–2.0)
Basophils Absolute: 0.1 10*3/uL (ref 0.0–0.1)
EOS ABS: 0.7 10*3/uL — AB (ref 0.0–0.5)
EOS%: 7.6 % — ABNORMAL HIGH (ref 0.0–7.0)
HEMATOCRIT: 38.4 % (ref 34.8–46.6)
HEMOGLOBIN: 12.9 g/dL (ref 11.6–15.9)
LYMPH#: 2.5 10*3/uL (ref 0.9–3.3)
LYMPH%: 29 % (ref 14.0–49.7)
MCH: 31.8 pg (ref 25.1–34.0)
MCHC: 33.7 g/dL (ref 31.5–36.0)
MCV: 94.6 fL (ref 79.5–101.0)
MONO#: 0.7 10*3/uL (ref 0.1–0.9)
MONO%: 7.7 % (ref 0.0–14.0)
NEUT%: 54.7 % (ref 38.4–76.8)
NEUTROS ABS: 4.7 10*3/uL (ref 1.5–6.5)
PLATELETS: 476 10*3/uL — AB (ref 145–400)
RBC: 4.06 10*6/uL (ref 3.70–5.45)
RDW: 13.6 % (ref 11.2–14.5)
WBC: 8.6 10*3/uL (ref 3.9–10.3)

## 2016-09-02 LAB — COMPREHENSIVE METABOLIC PANEL
ALBUMIN: 3.6 g/dL (ref 3.5–5.0)
ALK PHOS: 159 U/L — AB (ref 40–150)
ANION GAP: 10 meq/L (ref 3–11)
AST: 12 U/L (ref 5–34)
BUN: 15.6 mg/dL (ref 7.0–26.0)
CALCIUM: 9.6 mg/dL (ref 8.4–10.4)
CO2: 27 meq/L (ref 22–29)
CREATININE: 0.8 mg/dL (ref 0.6–1.1)
Chloride: 103 mEq/L (ref 98–109)
EGFR: 79 mL/min/{1.73_m2} — AB (ref >90)
Glucose: 92 mg/dl (ref 70–140)
Potassium: 4.2 mEq/L (ref 3.5–5.1)
Sodium: 140 mEq/L (ref 136–145)
TOTAL PROTEIN: 8 g/dL (ref 6.4–8.3)

## 2016-09-02 NOTE — Progress Notes (Signed)
Radiation Oncology         [phone] ________________________________ Thoracic Oncology Multidisciplinary Clinic Initial outpatient Consultation  Name: Emily Pope MRN: [id number]  Date: 09/02/2016  DOB: 1956-12-02  ZW:CHENI Barbette Reichmann, MD  Chesley Mires, MD   REFERRING PHYSICIAN: Chesley Mires, MD  DIAGNOSIS: The encounter diagnosis was Primary cancer of right upper lobe of lung ([place]).    ICD-9-CM ICD-10-CM   1. Primary cancer of right upper lobe of lung (HCC) 162.3 C34.11    60 y.o. 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  HISTORY OF PRESENT ILLNESS: Emily Pope is a 60 y.o. female seen at the request of Dr. Lamonte Sakai for a new lung cancer. The patient presented with symptoms concerning for pneumonia and was found on CXR to have a CXR concerning for a right lung opacity. A PET scan on 8/9 revealed a 7.9 x 5.6 cm central right upper lobe mass with SUV Of 18.4 which encases the right upper lobe bronchus and abuts the right mediastinal pleura continuous within the hilum and right lower tracheal wall. At least 5 pulmonary nodules in the RLL measured up to 7 mm but were below PET resolution. A LLL nodule measuring 5 mm was also below resolution. Hypermetabolic change in the right paratracheal node measuring 1.2 cm with an SUV of 4.2, a 1.2 cm subcarianal node with an SUV of 5.2, an 39m AP window node with SUV of 4.6, and left hilar 858mnode with SUV of 4.8 were noted. A endobronchial biopsy on 08/23/16 revealed poorly differentiated carcinoma consistent with adenocarcinoma was found. Her cytology was negative. She comes today to discuss the role of radiotherapy with Dr. MaTammi Klippeln the course of her treatment.      PREVIOUS RADIATION THERAPY: No  PAST MEDICAL HISTORY:  Past Medical History:  Diagnosis Date  . Anxiety    uses it for menopause   . Arthritis   . Chronic bronchitis ([place]  . COPD (chronic obstructive pulmonary disease)  ([place]  . GERD (gastroesophageal reflux disease)   . Headache    sinus  . Hypertension   . Pneumonia    07/22/16-had pneumonia in right lung and found mass in lung      PAST SURGICAL HISTORY: Past Surgical History:  Procedure Laterality Date  . ABDOMINAL HYSTERECTOMY    . APPENDECTOMY     when had hysterectomy  . ENDOBRONCHIAL ULTRASOUND Bilateral 08/23/2016   Procedure: ENDOBRONCHIAL ULTRASOUND;  Surgeon: RoCollene GobbleMD;  Location: [facility];  Service: Cardiopulmonary;  Laterality: Bilateral;  . HERNIA REPAIR     left inguinal hernia age [age]. TUBAL LIGATION      FAMILY HISTORY: No family history on file.  SOCIAL HISTORY:  Social History   Social History  . Marital status: Divorced    Spouse name: N/A  . Number of children: N/A  . Years of education: N/A   Occupational History  . Not on file.   Social History Main Topics  . Smoking status: Former Smoker    Packs/day: 2.00    Years: 43.00    Quit date: 07/20/2016  . Smokeless tobacco: Never Used  . Alcohol use No  . Drug use: No  . Sexual activity: Not on file   Other Topics Concern  . Not on file   Social History Narrative  . No narrative on file  [place]  [place]. She is accompanied by Jeneen Rinks, her long term boyfriend.  ALLERGIES: Codeine; Penicillins; Sulfa antibiotics; and Lincomycin  MEDICATIONS:  Current Outpatient Prescriptions  Medication Sig Dispense Refill  . albuterol (PROVENTIL HFA;VENTOLIN HFA) 108 (90 Base) MCG/ACT inhaler Inhale 2 puffs into the lungs every 6 (six) hours as needed for wheezing or shortness of breath. 1 Inhaler 6  . buPROPion (WELLBUTRIN XL) 150 MG 24 hr tablet Take 1 tablet (150 mg total) by mouth daily. 30 tablet 0  . diphenhydrAMINE (BENADRYL) 25 MG tablet Take 12.5 mg by mouth daily as needed for allergies.    Marland Kitchen lisinopril-hydrochlorothiazide (PRINZIDE,ZESTORETIC) 20-12.5 MG tablet Take 1 tablet by mouth  daily. 30 tablet 0  . loratadine (CLARITIN) 10 MG tablet Take 10 mg by mouth daily.    . naproxen (NAPROSYN) 500 MG tablet Take 1 tablet (500 mg total) by mouth 2 (two) times daily with a meal. 60 tablet 0  . traMADol (ULTRAM) 50 MG tablet Take 50 mg by mouth every 6 (six) hours as needed for moderate pain.  40 tablet 0   No current facility-administered medications for this encounter.     REVIEW OF SYSTEMS:  On review of systems, the patient reports that she is doing well overall. She denies any chest pain, shortness of breath, cough, fevers, chills, night sweats, unintended weight changes. She denies any bowel or bladder disturbances, and denies abdominal pain, nausea or vomiting. She denies any new musculoskeletal or joint aches or pains. A complete review of systems is obtained and is otherwise negative.     PHYSICAL EXAM:  Vitals with BMI 09/02/2016  Height '5\' 0"'$   Weight 115 lbs 11 oz  BMI 53.6  Systolic 644  Diastolic 73  Pulse 89  Respirations 16    Pain Scale 0/10 In general this is a well appearing Caucasian female in no acute distress. She's alert and oriented x4 and appropriate throughout the examination. Cardiopulmonary assessment is negative for acute distress and she exhibits normal effort.    KPS = 90  100 - Normal; no complaints; no evidence of disease. 90   - Able to carry on normal activity; minor signs or symptoms of disease. 80   - Normal activity with effort; some signs or symptoms of disease. 85   - Cares for self; unable to carry on normal activity or to do active work. 60   - Requires occasional assistance, but is able to care for most of his personal needs. 50   - Requires considerable assistance and frequent medical care. 49   - Disabled; requires special care and assistance. 17   - Severely disabled; hospital admission is indicated although death not imminent. 22   - Very sick; hospital admission necessary; active supportive treatment necessary. 10   -  Moribund; fatal processes progressing rapidly. 0     - Dead  Karnofsky DA, Abelmann [place], Craver LS and Burchenal [facility] [phone]) The use of the nitrogen mustards in the palliative treatment of carcinoma: with particular reference to bronchogenic carcinoma Cancer 1 634-56  LABORATORY DATA:  Lab Results  Component Value Date   HGB 12.2 08/23/2016   HCT 36.0 08/23/2016   Lab Results  Component Value Date   NA 143 08/23/2016   K 4.2 08/23/2016   No results found for: ALT, AST, GGT, ALKPHOS, BILITOT   RADIOGRAPHY: Nm Pet Image Initial (pi) Skull Base To Thigh  Result Date: 08/04/2016 CLINICAL DATA:  Initial treatment strategy for right hilar mass. A right upper  lobe opacity with volume loss was seen on recent chest radiograph. EXAM: NUCLEAR MEDICINE PET SKULL BASE TO THIGH TECHNIQUE: 6.1 mCi F-18 FDG was injected intravenously. Full-ring PET imaging was performed from the skull base to thigh after the radiotracer. CT data was obtained and used for attenuation correction and anatomic localization. FASTING BLOOD GLUCOSE:  Value: 103 mg/dl COMPARISON:  07/21/2016 chest radiograph. No prior chest CT is available at the time this dictation. FINDINGS: NECK No hypermetabolic lymph nodes in the neck. Symmetric hypermetabolism in the glottis without CT abnormality, probably physiologic. Symmetric hypermetabolism within the parotid glands and submandibular glands without appreciable mass on CT images, likely physiologic or inflammatory. CHEST There is an intensely hypermetabolic 7.9 x 5.6 cm central right upper lobe lung mass with prominent amorphous internal calcifications and max SUV 18.4 (series 8/image 22), which encases and prominently narrows the right upper lobe bronchus, and which abuts the right major and minor fissures. The mass abuts the right mediastinal pleura and appears continuous with the right superior hilum and right lower tracheal wall. There is volume loss in the right upper lobe. Patchy  consolidation with associated hypermetabolism throughout the periphery of the right upper lobe is most consistent with postobstructive pneumonia. At least 5 scattered pulmonary nodules in the right lower lobe measuring up to 7 mm (series 8/image 51), below PET resolution. Left lower lobe 5 mm pulmonary nodule (series 8/image 40), below PET resolution. Moderate centrilobular emphysema. Hypermetabolic right paratracheal enlarged 1.2 cm node with max SUV 4.2 (series 4/image 51). Enlarged hypermetabolic 1.2 cm subcarinal node with max SUV 5.2 (series 4/image 58). Hypermetabolic 0.8 cm AP window node with max SUV 4.6 (series 4/image 53). Hypermetabolic left hilar 0.8 cm node with max SUV 4.8 (series 4/image 59). No hypermetabolic axillary nodes. Left anterior descending coronary atherosclerosis. Atherosclerotic nonaneurysmal thoracic aorta. No pleural effusions. ABDOMEN/PELVIS No abnormal hypermetabolic activity within the liver, pancreas, adrenal glands, or spleen. No hypermetabolic lymph nodes in the abdomen or pelvis. Atherosclerotic nonaneurysmal abdominal aorta. Small gastric diverticulum at the posterior gastric fundus. Hysterectomy. SKELETON No focal hypermetabolic activity to suggest skeletal metastasis. IMPRESSION: 1. 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 Mass is confluent with the right superior hilum and right lower tracheal wall, suggestive of a T4 primary tumor. 2. Postobstructive pneumonia in the right upper lobe. 3. Hypermetabolic ipsilateral and contralateral mediastinal and contralateral hilar lymphadenopathy, suggestive of N3 nodal disease. 4. Bilateral lower lobe pulmonary nodules, including a 5 mm contralateral lower lobe pulmonary nodule, cannot exclude pulmonary metastases. 5. No osseous or extrathoracic metastatic disease. 6. Aortic atherosclerosis.   One vessel coronary atherosclerosis. Electronically Signed   By: Ilona Sorrel M.D.   On: 08/04/2016 16:20      IMPRESSION/PLAN: 1. 60 y.o. female with a Stage IIIB, T4, N3, M0 NSCLC, adenocarcinoma of the right upper lobe. Dr. Tammi Klippel spoke with the patient and family about the findings and work-up thus far.  We discussed the natural history of Stage IIIB adenocarcnoma of the right upper lobe and general treatment, highlighting the role of radiotherapy in the management.  We discussed the available radiation techniques, and focused on the details of logistics and delivery. He outlines 33 fractions of treatment over 6 1/2 weeks. Dr. Tammi Klippel recommends concurrent chemotherapy and the patient is interested in being treated closer to home with Dr. Whitney Muse at [facility]. We reviewed the anticipated acute and late sequelae associated  with radiation in this setting.  The patient was encouraged to ask questions that I answered to the best of my ability.  The patient would like to proceed with radiation and will be scheduled for CT simulation in [place] on Wednesday, 09/08/2016.   The above documentation reflects my direct findings during this shared patient visit. Please see the separate note by Dr. Tammi Klippel on this date for the remainder of the patient's plan of care.   Carola Rhine, PAC  This document serves as a record of services personally performed by Tyler Pita, MD and Shona Simpson, PAC. It was created on his behalf by Arlyce Harman, a trained medical scribe. The creation of this record is based on the scribe's personal observations and the provider's statements to them. This document has been checked and approved by the attending provider.

## 2016-09-02 NOTE — Progress Notes (Signed)
Oncology Nurse Navigator Documentation  Oncology Nurse Navigator Flowsheets 09/02/2016  Navigator Encounter Type Clinic/MDC/spoke with patient and friend at [facility].  Gave and explained information on next steps.   Abnormal Finding Date 06/18/2016  Patient Visit Type MedOnc  Treatment Phase Pre-Tx/Tx Discussion  Barriers/Navigation Needs Education  Education Other  Interventions Education Method  Education Method Verbal;Written  Acuity Level 2  Acuity Level 2 Educational needs  Time Spent with Patient 30

## 2016-09-02 NOTE — Progress Notes (Signed)
[facility] Clinical Social Work  Clinical Social Work met with patient/family at [employer] appointment to offer support and assess for psychosocial needs.  Patient was accompanied by her spouse.  Patient shared she has decided to be treated at [facility] for chemoradiation treatment due to transportation concerns.    Clinical Social Work briefly discussed Clinical Social Work role and encouraged patient to reach out to [facility] LCSW, [employer] with any questions or concerns.    Polo Riley, MSW, LCSW, OSW-C Clinical Social Worker [facility] [phone]

## 2016-09-02 NOTE — Progress Notes (Signed)
[facility] [phone]   [phone] Multidisciplinary thoracic oncology clinic  CONSULT NOTE  REFERRING PHYSICIAN: Dr. Baltazar Apo.  REASON FOR CONSULTATION:  60 years old white female recently diagnosed with lung cancer.  HPI Emily Pope is a 60 y.o. female was past medical history significant for COPD, hypertension as well as seasonal allergy and long history of smoking. The patient has been complaining of frequent episodes of bronchitis. She was seen by her primary care physician in July 2017 and treated with steroids and antibiotics with no improvement. Chest x-ray on 07/21/2016 showed opacity within the right upper lobe with volume loss questionable for endobronchial lesion. The patient had CT scan of the chest with contrast on 07/22/2016 and it showed partially calcified 7.0 x 5.2 x 5.3 cm right hilar mass encases the right mainstem bronchus with associated mediastinal and mild right hilar adenopathy with an index paratracheal mediastinal lymph node measuring 2.0 x 1.4 cm and subcarinal node measuring 1.9 x 1.3 cm. There was also right upper lobe consolidation questionable for post obstructive pneumonitis. The scan also showed 0.8 cm pleural-based right lower lobe nodule. She was referred to Dr. Halford Chessman and on 08/04/2016 she had a PET scan which showed the intensely hypermetabolic 7.9 x 5.6 cm central right upper lobe lung mass with prominent amorphous internal calcification and maximum SUV of 18.4. The mass encases and prominently now with the right upper lobe bronchus and abuts the right major and minor fissures in addition to the right mediastinal pleura and continuous with the right superior hilum and right lower tracheal wall. There are at least 5 scattered pulmonary nodules in the right lower lobe measuring up to 7 mm but below the PET resolution. There was also left lower lobe 5 mm pulmonary nodule below the PET resolution. The PET scan also  showed hypermetabolic right paratracheal enlarging 1.2 cm node with maximum SUV of 4.2 as well as enlargement hypermetabolic 1.2 cm subcarinal lymph node and hypermetabolic 0.8 cm AP window node as well as hypermetabolic left hilar 0.8 cm node. There was no osseous or extrathoracic metastatic disease. The patient underwent bronchoscopy under the care of Dr. Lamonte Sakai with biopsy of the right upper lobe lung mass. The final pathology (Accession: [id number]) showed poorly differentiated non-small cell carcinoma which morphologically favored squamous cell carcinoma but the immunohistochemical stains were positive for TTF-1 and negative for cytokeratin 5/6 consistent with poorly differentiated adenocarcinoma. There was insufficient tissue material for additional studies. Dr. Lamonte Sakai kindly referred the patient to the multidisciplinary thoracic oncology clinic today for further evaluation and recommendation regarding treatment of her condition. When seen today she is very anxious about her diagnosis and feeling tired. She denied having any significant chest pain but she continues to have shortness breath with exertion and cough productive of clear sputum. She denied having any hemoptysis. She complains of sinus headache but no significant nausea, vomiting, diarrhea or constipation. She denied having any significant weight loss or night sweats. She has no visual changes. Family history significant for mother and brother with lung cancer. Father had hypertension and diabetes mellitus. The patient is single and was accompanied by her boyfriend Leane Para. She has one son. 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 She lives in [place]. She has a history of smoking more than one pack per day for around 43 years and quit 10 months ago. She drinks alcohol occasionally and no history of drug abuse.  HPI  Past Medical History:  Diagnosis Date  . Anxiety    uses it for menopause   . Arthritis   . Chronic bronchitis  ([place])   . COPD (chronic obstructive pulmonary disease) ([place])   . GERD (gastroesophageal reflux disease)   . Headache    sinus  . Hypertension   . Pneumonia    07/22/16-had pneumonia in right lung and found mass in lung    Past Surgical History:  Procedure Laterality Date  . ABDOMINAL HYSTERECTOMY    . APPENDECTOMY     when had hysterectomy  . ENDOBRONCHIAL ULTRASOUND Bilateral 08/23/2016   Procedure: ENDOBRONCHIAL ULTRASOUND;  Surgeon: Collene Gobble, MD;  Location: [facility];  Service: Cardiopulmonary;  Laterality: Bilateral;  . HERNIA REPAIR     left inguinal hernia age 69  . TUBAL LIGATION      No family history on file.  Social History Social History  Substance Use Topics  . Smoking status: Former Smoker    Packs/day: 2.00    Years: 43.00    Quit date: 07/20/2016  . Smokeless tobacco: Never Used  . Alcohol use No    Allergies  Allergen Reactions  . Codeine Anaphylaxis  . Penicillins Anaphylaxis  . Sulfa Antibiotics Nausea Only  . Lincomycin Rash    Also Clindamycin as it has lincomycin in it    Current Outpatient Prescriptions  Medication Sig Dispense Refill  . albuterol (PROVENTIL HFA;VENTOLIN HFA) 108 (90 Base) MCG/ACT inhaler Inhale 2 puffs into the lungs every 6 (six) hours as needed for wheezing or shortness of breath. 1 Inhaler 6  . buPROPion (WELLBUTRIN XL) 150 MG 24 hr tablet Take 1 tablet (150 mg total) by mouth daily. 30 tablet 0  . diphenhydrAMINE (BENADRYL) 25 MG tablet Take 12.5 mg by mouth daily as needed for allergies.    Marland Kitchen lisinopril-hydrochlorothiazide (PRINZIDE,ZESTORETIC) 20-12.5 MG tablet Take 1 tablet by mouth daily. 30 tablet 0  . loratadine (CLARITIN) 10 MG tablet Take 10 mg by mouth daily.    . naproxen (NAPROSYN) 500 MG tablet Take 1 tablet (500 mg total) by mouth 2 (two) times daily with a meal. 60 tablet 0  . traMADol (ULTRAM) 50 MG tablet Take 50 mg by mouth every 6 (six) hours as needed for moderate pain.  40 tablet 0   No current  facility-administered medications for this visit.     Review of Systems  Constitutional: positive for fatigue Eyes: negative Ears, nose, mouth, throat, and face: negative Respiratory: positive for cough, dyspnea on exertion and sputum Cardiovascular: negative Gastrointestinal: negative Genitourinary:negative Integument/breast: negative Hematologic/lymphatic: negative Musculoskeletal:negative Neurological: negative Behavioral/Psych: positive for anxiety Endocrine: negative Allergic/Immunologic: negative  Physical Exam  WEX:HBZJI, healthy, no distress, well nourished, well developed and anxious SKIN: skin color, texture, turgor are normal, no rashes or significant lesions HEAD: Normocephalic, No masses, lesions, tenderness or abnormalities EYES: normal, PERRLA, Conjunctiva are pink and non-injected EARS: External ears normal OROPHARYNX:no exudate, no erythema and lips, buccal mucosa, and tongue normal  NECK: supple, no adenopathy, no JVD LYMPH:  no palpable lymphadenopathy, no hepatosplenomegaly BREAST:not examined LUNGS: clear to auscultation , and palpation HEART: regular rate & rhythm, no murmurs and no gallops ABDOMEN:abdomen soft, non-tender, normal bowel sounds and no masses or organomegaly BACK: Back symmetric, no curvature., No CVA tenderness EXTREMITIES:no joint deformities, effusion, or inflammation, no edema, no skin discoloration  NEURO: alert & oriented x 3 with fluent speech, no focal motor/sensory deficits  PERFORMANCE STATUS: ECOG 1  LABORATORY DATA: Lab Results  Component Value Date  WBC 8.6 09/02/2016   HGB 12.9 09/02/2016   HCT 38.4 09/02/2016   MCV 94.6 09/02/2016   PLT 476 (H) 09/02/2016      Chemistry      Component Value Date/Time   NA 140 09/02/2016 1403   K 4.2 09/02/2016 1403   CO2 27 09/02/2016 1403   BUN 15.6 09/02/2016 1403   CREATININE 0.8 09/02/2016 1403      Component Value Date/Time   CALCIUM 9.6 09/02/2016 1403   ALKPHOS  159 (H) 09/02/2016 1403   AST 12 09/02/2016 1403   ALT <9 09/02/2016 1403   BILITOT <0.30 09/02/2016 1403       RADIOGRAPHIC STUDIES: Nm Pet Image Initial (pi) Skull Base To Thigh  Result Date: 08/04/2016 CLINICAL DATA:  Initial treatment strategy for right hilar mass. A right upper lobe opacity with volume loss was seen on recent chest radiograph. EXAM: NUCLEAR MEDICINE PET SKULL BASE TO THIGH TECHNIQUE: 6.1 mCi F-18 FDG was injected intravenously. Full-ring PET imaging was performed from the skull base to thigh after the radiotracer. CT data was obtained and used for attenuation correction and anatomic localization. FASTING BLOOD GLUCOSE:  Value: 103 mg/dl COMPARISON:  07/21/2016 chest radiograph. No prior chest CT is available at the time this dictation. FINDINGS: NECK No hypermetabolic lymph nodes in the neck. Symmetric hypermetabolism in the glottis without CT abnormality, probably physiologic. Symmetric hypermetabolism within the parotid glands and submandibular glands without appreciable mass on CT images, likely physiologic or inflammatory. CHEST There is an intensely hypermetabolic 7.9 x 5.6 cm central right upper lobe lung mass with prominent amorphous internal calcifications and max SUV 18.4 (series 8/image 22), which encases and prominently narrows the right upper lobe bronchus, and which abuts the right major and minor fissures. The mass abuts the right mediastinal pleura and appears continuous with the right superior hilum and right lower tracheal wall. There is volume loss in the right upper lobe. Patchy consolidation with associated hypermetabolism throughout the periphery of the right upper lobe is most consistent with postobstructive pneumonia. At least 5 scattered pulmonary nodules in the right lower lobe measuring up to 7 mm (series 8/image 51), below PET resolution. Left lower lobe 5 mm pulmonary nodule (series 8/image 40), below PET resolution. Moderate centrilobular emphysema.  Hypermetabolic right paratracheal enlarged 1.2 cm node with max SUV 4.2 (series 4/image 51). Enlarged hypermetabolic 1.2 cm subcarinal node with max SUV 5.2 (series 4/image 58). Hypermetabolic 0.8 cm AP window node with max SUV 4.6 (series 4/image 53). Hypermetabolic left hilar 0.8 cm node with max SUV 4.8 (series 4/image 59). No hypermetabolic axillary nodes. Left anterior descending coronary atherosclerosis. Atherosclerotic nonaneurysmal thoracic aorta. No pleural effusions. ABDOMEN/PELVIS No abnormal hypermetabolic activity within the liver, pancreas, adrenal glands, or spleen. No hypermetabolic lymph nodes in the abdomen or pelvis. Atherosclerotic nonaneurysmal abdominal aorta. Small gastric diverticulum at the posterior gastric fundus. Hysterectomy. SKELETON No focal hypermetabolic activity to suggest skeletal metastasis. IMPRESSION: 1. 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 Mass is confluent with the right superior hilum and right lower tracheal wall, suggestive of a T4 primary tumor. 2. Postobstructive pneumonia in the right upper lobe. 3. Hypermetabolic ipsilateral and contralateral mediastinal and contralateral hilar lymphadenopathy, suggestive of N3 nodal disease. 4. Bilateral lower lobe pulmonary nodules, including a 5 mm contralateral lower lobe pulmonary nodule, cannot exclude pulmonary metastases. 5. No osseous or extrathoracic metastatic disease. 6. Aortic atherosclerosis.  One vessel coronary atherosclerosis. Electronically Signed   By: Ilona Sorrel M.D.   On: 08/04/2016 16:20    ASSESSMENT: This is a very pleasant 60 years old white female with stage IIIB (T3, N3, M0) non-small cell lung cancer, poorly differentiated adenocarcinoma diagnosed in August 2017 and presented with right upper lobe lung mass in addition to mediastinal and bilateral hilar  lymphadenopathy.   PLAN: I had a lengthy discussion with the patient and her boyfriend today about her current disease stage, prognosis and treatment options. I recommended for the patient to complete the staging workup by ordering a MRI of the brain to rule out brain metastasis and this will be done at [facility] closer to home. There is insufficient material for molecular studies but the patient may benefit from spending blood test for molecular studies if needed by Dr. Whitney Muse. I also discussed with the patient her treatment options. I recommended for the patient a course of concurrent chemoradiation with weekly carboplatin for AUC of 2 and paclitaxel 45 MG/M2. I discussed with the patient adverse effect of the chemotherapy including but not limited to alopecia, myelosuppression, nausea and vomiting, peripheral neuropathy, liver or renal dysfunction. She is interested in treatment but she would like to take it close to home. I will arrange for the patient to see Dr. Whitney Muse at [facility] for consideration of the chemotherapy closer to home. The patient will be seen later today by Dr. Tammi Klippel for discussion of the radiotherapy option. She was seen during the multidisciplinary thoracic oncology clinic today by medical oncology, radiation oncology, thoracic navigator, social worker and physical therapist. She was advised to call immediately if she has any concerning symptoms until she sees Dr. Whitney Muse. The patient voices understanding of current disease status and treatment options and is in agreement with the current care plan.  All questions were answered. The patient knows to call the clinic with any problems, questions or concerns. We can certainly see the patient much sooner if necessary.  Thank you so much for allowing me to participate in the care of Emily Pope. I will continue to follow up the patient with you and assist in her care.  I spent 55 minutes counseling  the patient face to face. The total time spent in the appointment was 80 minutes.  Disclaimer: This note was dictated with voice recognition software. Similar sounding words can inadvertently be transcribed and may not be corrected upon review.   Treyvone Chelf K. September 02, 2016, 3:02 PM

## 2016-09-03 ENCOUNTER — Telehealth: Payer: Self-pay | Admitting: *Deleted

## 2016-09-03 ENCOUNTER — Encounter: Payer: Self-pay | Admitting: *Deleted

## 2016-09-03 NOTE — Telephone Encounter (Signed)
Oncology Nurse Navigator Documentation  Oncology Nurse Navigator Flowsheets 09/03/2016  Navigator Encounter Type Telephone/I called central scheduling to schedule MRI Brain.  I then called patient with appt time and place.  She verbalized understanding of appt.   Telephone Outgoing Call  Treatment Phase Pre-Tx/Tx Discussion  Barriers/Navigation Needs Coordination of Care  Interventions Coordination of Care  Acuity Level 2  Acuity Level 2 Assistance expediting appointments  Time Spent with Patient 30

## 2016-09-04 ENCOUNTER — Encounter: Payer: Self-pay | Admitting: Internal Medicine

## 2016-09-07 ENCOUNTER — Encounter: Payer: Self-pay | Admitting: *Deleted

## 2016-09-07 NOTE — Progress Notes (Signed)
Oncology Nurse Navigator Documentation  Oncology Nurse Navigator Flowsheets 09/07/2016  Navigator Encounter Type Other/started oncology hx documentation.   Treatment Phase Pre-Tx/Tx Discussion  Barriers/Navigation Needs Coordination of Care  Interventions Coordination of Care  Acuity Level 2  Acuity Level 2 Other  Time Spent with Patient 30

## 2016-09-08 ENCOUNTER — Encounter (HOSPITAL_COMMUNITY): Payer: Medicaid Other | Attending: Hematology & Oncology | Admitting: Hematology

## 2016-09-08 ENCOUNTER — Encounter (HOSPITAL_COMMUNITY): Payer: Self-pay | Admitting: Hematology & Oncology

## 2016-09-08 VITALS — BP 143/75 | HR 85 | Temp 98.1°F | Resp 16 | Ht 60.25 in | Wt 118.2 lb

## 2016-09-08 DIAGNOSIS — Z9221 Personal history of antineoplastic chemotherapy: Secondary | ICD-10-CM | POA: Insufficient documentation

## 2016-09-08 DIAGNOSIS — J42 Unspecified chronic bronchitis: Secondary | ICD-10-CM | POA: Diagnosis not present

## 2016-09-08 DIAGNOSIS — C3411 Malignant neoplasm of upper lobe, right bronchus or lung: Secondary | ICD-10-CM | POA: Insufficient documentation

## 2016-09-08 DIAGNOSIS — F419 Anxiety disorder, unspecified: Secondary | ICD-10-CM | POA: Diagnosis not present

## 2016-09-08 NOTE — Patient Instructions (Addendum)
[place] at [facility] Discharge Instructions  RECOMMENDATIONS MADE BY THE CONSULTANT AND ANY TEST RESULTS WILL BE SENT TO YOUR REFERRING PHYSICIAN.  You saw Dr. Whitney Muse today. Anderson Malta, Nurse Navigator, will call you tomorrow. You have brain MRI on Friday. You will be scheduled for PICC line after MRI. Weekly carbo/taxol for [redacted] weeks along with radiation in [place]. You will be scheduled for chemo teaching.  Thank you for choosing [place] at [facility] to provide your oncology and hematology care.  To afford each patient quality time with our provider, please arrive at least 15 minutes before your scheduled appointment time.   Beginning January 23rd 2017 lab work for the [facility] will be done in the  Main lab at [facility] on 1st floor. If you have a lab appointment with the [facility] please come in thru the  Main Entrance and check in at the main information desk  You need to re-schedule your appointment should you arrive 10 or more minutes late.  We strive to give you quality time with our providers, and arriving late affects you and other patients whose appointments are after yours.  Also, if you no show three or more times for appointments you may be dismissed from the clinic at the providers discretion.     Again, thank you for choosing [facility].  Our hope is that these requests will decrease the amount of time that you wait before being seen by our physicians.       _____________________________________________________________  Should you have questions after your visit to [facility], please contact our office at [phone] between the hours of 8:30 a.m. and 4:30 p.m.  Voicemails left after 4:30 p.m. will not be returned until the following business day.  For prescription refill requests, have your pharmacy contact our office.         Resources For Cancer Patients and their  Caregivers ? American Cancer Society: Can assist with transportation, wigs, general needs, runs Look Good Feel Better.        4753712028 ? Cancer Care: Provides financial assistance, online support groups, medication/co-pay assistance.  1-800-813-HOPE 930 288 8383) ? [place] Assists [place] cancer patients and their families through emotional , educational and financial support.  9037463894 ? [place] DSS Where to apply for food stamps, Medicaid and utility assistance. 475-689-5272 ? [facility]: Transportation to medical appointments. (657) 599-6394 ? Social Security Administration: May apply for disability if have a Stage IV cancer. 3046315192 918-169-7244 ? [employer], Disability and Transit Services: Assists with nutrition, care and transit needs. [place] Support Programs: '@10RELATIVEDAYS'$ @ > Cancer Support Group  2nd Tuesday of the month 1pm-2pm, Journey Room  > Creative Journey  3rd Tuesday of the month 1130am-1pm, Journey Room  > Look Good Feel Better  1st Wednesday of the month 10am-12 noon, Journey Room (Call [place] to register [phone])

## 2016-09-08 NOTE — Progress Notes (Signed)
HEMATOLOGY/ONCOLOGY CONSULTATION NOTE  Date of Service: 09/08/2016  Patient Care Team: [name], MD as PCP - General (Internal Medicine)  CHIEF COMPLAINTS/PURPOSE OF CONSULTATION:  Lung cancer  HISTORY OF PRESENTING ILLNESS:   Emily Pope is a wonderful 60 y.o. female who has been referred to [place] by Dr .[name], MD/Dr [facility]  for evaluation and management of newly diagnosed lung cancer.  Patient has a history of COPD, hypertension, anxiety presented with symptoms of chest discomfort which she thought was because of frequent episodes of bronchitis. She was seen by [facility] care physician in July 2017 and was empirically treated with steroids and antibiotics with no improvement in her symptoms. She subsequently had a chest x-ray on 07/21/2016 to rule out pneumonia and was noted to have an opacity in her right upper lobe with volume loss concerning for a mass.  Patient had a CT scan of the chest with contrast on 07/22/2016 that showed a large right hilar mass encasing the right mainstem bronchus with associated mediastinal and hilar lymphadenopathy. There was also some concerns of postobstructive pneumonitis and the patient notes that she was treated with antibiotics for 10 days. Patient had a PET/CT scan on 08/05/19 which showed a large 7.9 x 5.6 cm central right upper lobe lung mass with an SUV of 18.4. It was noted to be a T4 mass based on imaging findings and N3 nodal status based on involvement of contralateral mediastinal and hilar lymph nodes (Stage IIIB disease).  Patient had a bronchoscopic biopsy with Dr. Lamonte Sakai on 08/23/2016 which showed poorly differentiated non-small cell lung cancer. TTF-1 positive and cytokeratin 5/6 negative consistent with poorly differentiated adenocarcinoma. Inadequate tissue for additional molecular studies and PDL 1 testing.  Patient has been ordered for an MRI of the brain which is scheduled for Friday.  She has been seen by Dr. Tammi Klippel from  radiation oncology to consider concurrent chemoradiation in the absence of brain metastases.  Patient notes some anxiety but notes otherwise that her breathing has been stable.  No weight loss. Currently no fevers no chills no night sweats. No hemoptysis. Has been trying to stay physically active.  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 She reports that she has applied for Medicaid and Social Security disability.   Had a mammogram 2 years ago which showed a "fatty tumor". Colonoscopy more than 10 years ago .  Patient was a heavy smoker smoked 2 packs per day quit in July 2017.    MEDICAL HISTORY :  Past Medical History:  Diagnosis Date  . Anxiety    uses it for menopause   . Arthritis   . Chronic bronchitis ([place])   . COPD (chronic obstructive pulmonary disease) ([place])   . GERD (gastroesophageal reflux disease)   . Headache    sinus  . Hypertension   . Pneumonia    07/22/16-had pneumonia in right lung and found mass in lung    SURGICAL HISTORY: Past Surgical History:  Procedure Laterality Date  . ABDOMINAL HYSTERECTOMY    . APPENDECTOMY     when had hysterectomy  . ENDOBRONCHIAL ULTRASOUND Bilateral 08/23/2016   Procedure: ENDOBRONCHIAL ULTRASOUND;  Surgeon: Collene Gobble, MD;  Location: [facility] ENDOSCOPY;  Service: Cardiopulmonary;  Laterality: Bilateral;  . HERNIA REPAIR     left inguinal hernia age 38  . TUBAL LIGATION      SOCIAL HISTORY: Social History   Social History  . Marital  status: Divorced    Spouse name: N/A  . Number of children: N/A  . Years of education: N/A   Occupational History  . Not on file.   Social History Main Topics  . Smoking status: Former Smoker    Packs/day: 2.00    Years: 43.00    Quit date: 07/20/2016  . Smokeless tobacco: Never Used  . Alcohol use No  . Drug use: No  . Sexual activity: Not on file   Other Topics Concern  . Not on file   Social History Narrative  . No  narrative on file  Patient lives with her significant other Earney Mallet [facility]) -  they have been together for 24 hours . Son lives in [place] works as an Therapist, sports at [employer]. Other family lives in [place].  FAMILY HISTORY:  Family History  Problem Relation Age of Onset  . Cancer Mother     lung  . Hypertension Mother   . Hypertension Father   . Diabetes Father   . Hypertension Sister   . Cancer Brother     lung  . Hypertension Sister     ALLERGIES:  is allergic to codeine; penicillins; sulfa antibiotics; and lincomycin.  MEDICATIONS:  Current Outpatient Prescriptions  Medication Sig Dispense Refill  . albuterol (PROVENTIL HFA;VENTOLIN HFA) 108 (90 Base) MCG/ACT inhaler Inhale 2 puffs into the lungs every 6 (six) hours as needed for wheezing or shortness of breath. 1 Inhaler 6  . buPROPion (WELLBUTRIN XL) 150 MG 24 hr tablet Take 150 mg by mouth daily.    . diphenhydrAMINE (BENADRYL) 25 MG tablet Take 12.5 mg by mouth daily as needed for allergies.    Marland Kitchen lisinopril-hydrochlorothiazide (PRINZIDE,ZESTORETIC) 20-12.5 MG tablet Take 1 tablet by mouth daily. 30 tablet 0  . loratadine (CLARITIN) 10 MG tablet Take 10 mg by mouth daily.    . naproxen (NAPROSYN) 500 MG tablet Take 1 tablet (500 mg total) by mouth 2 (two) times daily with a meal. 60 tablet 0  . traMADol (ULTRAM) 50 MG tablet Take 50 mg by mouth every 6 (six) hours as needed for moderate pain.  40 tablet 0   No current facility-administered medications for this visit.     REVIEW OF SYSTEMS:    10 Point review of Systems was done is negative except as noted above.  PHYSICAL EXAMINATION: ECOG PERFORMANCE STATUS: 1 - Symptomatic but completely ambulatory  . Vitals:   09/08/16 1635  BP: (!) 143/75  Pulse: 85  Resp: 16  Temp: 98.1 F (36.7 C)   Filed Weights   09/08/16 1635  Weight: 118 lb 3.2 oz (53.6 kg)   .Body mass index is 22.89 kg/m.  GENERAL:alert, in no acute  distress and comfortable SKIN: skin color, texture, turgor are normal, no rashes or significant lesions EYES: normal, conjunctiva are pink and non-injected, sclera clear OROPHARYNX:no exudate, no erythema and lips, buccal mucosa, and tongue normal  NECK: supple, no JVD, thyroid normal size, non-tender, without nodularity LYMPH:  no palpable lymphadenopathy in the cervical, axillary or inguinal LUNGS: clear to auscultation with normal respiratory effort HEART: regular rate & rhythm,  no murmurs and no lower extremity edema ABDOMEN: abdomen soft, non-tender, normoactive bowel sounds , no palpable hepatosplenomegaly  Musculoskeletal: no cyanosis of digits and no clubbing  PSYCH: alert & oriented x 3 with fluent speech NEURO: no focal motor/sensory deficits  LABORATORY DATA:  I have reviewed the data as listed   CBC Latest Ref Rng &  Units 09/02/2016 08/23/2016  WBC 3.9 - 10.3 10e3/uL 8.6 -  Hemoglobin 11.6 - 15.9 g/dL 12.9 12.2  Hematocrit 34.8 - 46.6 % 38.4 36.0  Platelets 145 - 400 10e3/uL 476(H) -    CMP Latest Ref Rng & Units 09/02/2016 08/23/2016  Glucose 70 - 140 mg/dl 92 82  BUN 7.0 - 26.0 mg/dL 15.6 -  Creatinine 0.6 - 1.1 mg/dL 0.8 -  Sodium 136 - 145 mEq/L 140 143  Potassium 3.5 - 5.1 mEq/L 4.2 4.2  CO2 22 - 29 mEq/L 27 -  Calcium 8.4 - 10.4 mg/dL 9.6 -  Total Protein 6.4 - 8.3 g/dL 8.0 -  Total Bilirubin 0.20 - 1.20 mg/dL <0.30 -  Alkaline Phos 40 - 150 U/L 159(H) -  AST 5 - 34 U/L 12 -  ALT 0 - 55 U/L <9 -      RADIOGRAPHIC STUDIES: I have personally reviewed the radiological images as listed and agreed with the findings in the report.  PET/CT 08/04/2016: IMPRESSION: 1. 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 Mass is confluent with the right superior hilum and right lower tracheal wall, suggestive of a T4 primary tumor. 2.  Postobstructive pneumonia in the right upper lobe. 3. Hypermetabolic ipsilateral and contralateral mediastinal and contralateral hilar lymphadenopathy, suggestive of N3 nodal disease. 4. Bilateral lower lobe pulmonary nodules, including a 5 mm contralateral lower lobe pulmonary nodule, cannot exclude pulmonary metastases. 5. No osseous or extrathoracic metastatic disease. 6. Aortic atherosclerosis.  One vessel coronary atherosclerosis.   Electronically Signed   By: Ilona Sorrel M.D.   On: 08/04/2016 16:20   ASSESSMENT & PLAN:   60 year old Caucasian female   #1 newly diagnosed poorly differentiated lung adenocarcinoma. At least Stage IIIB (cT4,cN3,Mx).  ECOG performance status 1  #2 history of COPD not oxygen dependent. Was minimally symptomatic from this with no significant activity restriction at baseline. #3 ex-smoker 2 packs per day for 40 years Plan -MRI brain scheduled for Friday to complete staging workup. -if MRI brain shows no evidence of metastatic disease would treat this with concurrent chemoradiation as stage IIIB disease with weekly carboplatin/taxol while on  Radiation therapy (chemotherapy orders placed to start on 09/15/2016) -Patient has been seen by Dr. Ledon Snare for radiation oncology evaluation and has had CT simulation done already. - would consider 2 cycle of q3weekly carbo-taxol with G-CSF after chemoradiation completed. -If the patient is noted to have metastatic disease in the brain would likely need to be addressed first.  -if the patient has metastatic disease in the brain or subsequently elsewhere would need to consider repeat biopsy to get adequate tissue for EGFR mutation, ALK gene rearrangement, Ros 1 mutation and Braf mutation testing as well as PDL1 to determine candidacy for targeted therapies in the metastatic setting. -Alternatively in metastatic setting could consider Guardant 360 testing but would not be able to get PDL1 data. -Dr Whitney Muse will  be determine port vs picc access based on MRI brain results -patient given contact information to SW to get help with SS disability and medicaid applications.  #4 Anxiety -Patient reports that she is doing okay with her bupropion currently and does not want any other new medications at this time.  #5 neoplasm related pain- patient notes the pain is reasonably controlled at this time and does not require any other pain medications.  #6 COPD -Uses albuterol inhaler as needed -Continue follow-up  with primary care physician for management of other medical comorbidities.  Case was discussed with Dr Whitney Muse and patient met her as well. Dr Whitney Muse will continue to follow the patient at this time.   All of the patients questions were answered with apparent satisfaction. The patient knows to call the clinic with any problems, questions or concerns.  I spent 60 minutes counseling the patient face to face. The total time spent in the appointment was 60 minutes and more than 50% was on counseling and direct patient cares.    Sullivan Lone MD [place] AAHIVMS [facility] Hematology/Oncology [facility]  (Office):       [phone] (Work cell):  [phone] (Fax):           [phone]  09/08/2016 4:47 PM

## 2016-09-08 NOTE — Progress Notes (Signed)
START ON PATHWAY REGIMEN - Non-Small Cell Lung  HAF790: Carboplatin AUC=2 + Paclitaxel 45 mg/m2 Weekly During Radiation   Administer weekly:     Paclitaxel (Taxol(R)) 45 mg/m2 in 250 mL NS IV over 1 hour followed by Dose Mod: None     Carboplatin (Paraplatin(R)) AUC=2 in 100 mL NS IV over 30 minutes Dose Mod: None Additional Orders: * All AUC calculations intended to be used in [employer] formula  **Always confirm dose/schedule in your pharmacy ordering system**    Patient Characteristics: Stage III - Unresectable, PS = 0, 1 AJCC M Stage: X AJCC N Stage: X AJCC T Stage: X Current Disease Status: No Distant Metastases or Local Recurrence AJCC Stage Grouping: IIIB Performance Status: PS = 0, 1  Intent of Therapy: Curative Intent, Discussed with Patient

## 2016-09-09 ENCOUNTER — Telehealth (HOSPITAL_COMMUNITY): Payer: Self-pay | Admitting: Emergency Medicine

## 2016-09-09 NOTE — Telephone Encounter (Signed)
Called pt with appt for Dr Arnoldo Morale for her port, 06/14/2016 at 10 am.  Then we need her to call Caryl Pina who is the financial person for the hospital and she needs to help her with some paperwork since she will be self-pay until her medicare goes through.  I give her [facility] number, [phone].  Pt verbalized understanding.

## 2016-09-09 NOTE — Telephone Encounter (Signed)
Called to introduce myself to the pt.  Answered any questions she had.  Chemotherapy teaching 09-15-2016 at 11:30 am.  Pt told to come to the cancer center and we would take her over.  Pt would like to start chemo/radiation on a Monday if she could.  We will do this if at all possible for her.

## 2016-09-10 ENCOUNTER — Other Ambulatory Visit (HOSPITAL_COMMUNITY): Payer: Self-pay | Admitting: Hematology & Oncology

## 2016-09-10 ENCOUNTER — Ambulatory Visit (HOSPITAL_COMMUNITY)
Admission: RE | Admit: 2016-09-10 | Discharge: 2016-09-10 | Disposition: A | Discharge status: Home / Self Care | Payer: Medicaid Other | Source: Ambulatory Visit | Attending: Internal Medicine | Admitting: Internal Medicine

## 2016-09-10 ENCOUNTER — Encounter (HOSPITAL_COMMUNITY): Payer: Self-pay | Admitting: Emergency Medicine

## 2016-09-10 ENCOUNTER — Telehealth (HOSPITAL_COMMUNITY): Payer: Self-pay | Admitting: Emergency Medicine

## 2016-09-10 ENCOUNTER — Other Ambulatory Visit (HOSPITAL_COMMUNITY): Payer: Self-pay | Admitting: Emergency Medicine

## 2016-09-10 DIAGNOSIS — J329 Chronic sinusitis, unspecified: Secondary | ICD-10-CM | POA: Insufficient documentation

## 2016-09-10 DIAGNOSIS — I739 Peripheral vascular disease, unspecified: Secondary | ICD-10-CM | POA: Diagnosis not present

## 2016-09-10 DIAGNOSIS — C3411 Malignant neoplasm of upper lobe, right bronchus or lung: Secondary | ICD-10-CM | POA: Diagnosis present

## 2016-09-10 MED ORDER — SODIUM CHLORIDE 0.9% FLUSH
INTRAVENOUS | Status: AC
  Filled 2016-09-10: qty 200

## 2016-09-10 MED ORDER — PROCHLORPERAZINE MALEATE 10 MG PO TABS: 10.0000 mg | ORAL_TABLET | Freq: Four times a day (QID) | ORAL | 1 refills | Status: DC | PRN

## 2016-09-10 MED ORDER — ONDANSETRON HCL 8 MG PO TABS: 8.0000 mg | ORAL_TABLET | Freq: Two times a day (BID) | ORAL | 1 refills | Status: DC | PRN

## 2016-09-10 MED ORDER — LIDOCAINE-PRILOCAINE 2.5-2.5 % EX CREA: 1.0000 "application " | TOPICAL_CREAM | CUTANEOUS | 3 refills | Status: DC | PRN

## 2016-09-10 MED ORDER — SODIUM CHLORIDE 0.9% FLUSH
INTRAVENOUS | Status: AC
  Filled 2016-09-10: qty 30

## 2016-09-10 MED ORDER — DEXAMETHASONE 4 MG PO TABS: 8.0000 mg | ORAL_TABLET | Freq: Every day | ORAL | 1 refills | Status: DC

## 2016-09-10 MED ORDER — GADOBENATE DIMEGLUMINE 529 MG/ML IV SOLN: 20.0000 mL | Freq: Once | INTRAVENOUS | Status: DC | PRN

## 2016-09-10 MED ORDER — GADOBENATE DIMEGLUMINE 529 MG/ML IV SOLN
10.0000 mL | Freq: Once | INTRAVENOUS | Status: AC | PRN
  Administered 2016-09-10: 10 mL via INTRAVENOUS

## 2016-09-10 NOTE — Telephone Encounter (Signed)
Called pt to tell her that her MRI was negative

## 2016-09-10 NOTE — Patient Instructions (Addendum)
[place]   CHEMOTHERAPY INSTRUCTIONS  Premeds: Aloxi - high powered nausea/vomiting prevention medication used for chemotherapy patients. Benadryl: help prevent any reactions to the chemotherapy.  Pepcid: premed antihistamine.  Dexamethasone - steroid - given to reduce the risk of you having an allergic type reaction to the chemotherapy. Dex can cause you to feel energized, nervous/anxious/jittery, make you have trouble sleeping, and/or make you feel hot/flushed in the face/neck and/or look pink/red in the face/neck. These side effects will pass as the Dex wears off. (takes 20 minutes to infuse)    SELF IMAGE NEEDS AND REFERRALS MADE: Information on look good feel better given   EDUCATIONAL MATERIALS GIVEN AND REVIEWED: Chemotherapy and you book, nutrition book given, information on taxol and carboplatin   SELF CARE ACTIVITIES WHILE ON CHEMOTHERAPY: Increase your fluid intake 48 hours prior to treatment and drink at least 2 quarts (64 oz of water/decaff beverages) per day after treatment. No alcohol intake. No aspirin or other medications unless approved by your oncologist. Eat foods that are light and easy to digest. Eat foods at cold or room temperature (as long as you aren't on the drug Oxaliplatin). No fried, fatty, or spicy foods immediately before or after treatment. Have teeth cleaned professionally before starting treatment. Keep dentures and partial plates clean. Use soft toothbrush and do not use mouthwashes that contain alcohol. Biotene is a good mouthwash that is available at most pharmacies or may be ordered by calling [phone]. Use warm salt water gargles (1 teaspoon salt per 1 quart warm water) before and after meals and at bedtime. Or you may rinse with 2 tablespoons of three-percent hydrogen peroxide mixed in eight ounces of water. Always use sunscreen that has not expired and with SPF (Sun Protection Factor) of 50 or higher. Wear hats to protect your head  from the sun. Remember to use sunscreen on your hands, ears, face, & feet. Use your nausea medication as directed to prevent nausea. Use your stool softener or laxative as directed to prevent constipation. Use your anti-diarrheal medication as directed to stop diarrhea.   Please wash your hands for at least 30 seconds using warm soapy water. Handwashing is the #1 way to prevent the spread of germs. Stay away from sick people or people who are getting over a cold. If you develop respiratory systems such as green/yellow mucus production or productive cough or persistent cough let us know and we will see if you need an antibiotic. It is a good idea to keep a pair of gloves on when going into grocery stores/[employer] to decrease your risk of coming into contact with germs on the carts, etc. Carry alcohol hand gel with you at all times and use it frequently if out in public. All foods need to be cooked thoroughly. No raw foods. No medium or undercooked meats, eggs. If your food is cooked medium well, it does not need to be hot pink or saturated with bloody liquid at all. Vegetables and fruits need to be washed/rinsed under the faucet with a dish detergent before being consumed. You can eat raw fruits and vegetables unless we tell you otherwise but it would be best if you cooked them or bought frozen. Do not eat off of salad bars or hot bars unless you really trust the cleanliness of the restaurant. If you need dental work, please let Dr. Whitney Muse know before you go for your appointment so that we can coordinate the best possible time for you in regards  to your chemo regimen. You need to also let your dentist know that you are actively taking chemo. We may need to do labs prior to your dental appointment. We also want your bowels moving at least every other day. If this is not happening, we need to know so that we can get you on a bowel regimen to help you go. If you are going to have sex, a condom must be used to protect  the person that isn't taking chemotherapy. Chemo can decrease your libido (sex drive).   MEDICATIONS:   Zofran/Ondansetron '8mg'$  tablet. Take 1 tablet every 8 hours as needed for nausea/vomiting. (#1 nausea med to take, this can constipate)  Compazine/Prochlorperazine '10mg'$  tablet. Take 1 tablet every 6 hours as needed for nausea/vomiting. (#2 nausea med to take, this can make you sleepy)   EMLA cream. Apply a quarter size amount to port site 1 hour prior to chemo. Do not rub in. Cover with plastic wrap.   Over-the-Counter Meds:  Miralax 1 capful in 8 oz of fluid daily. May increase to two times a day if needed. This is a stool softener. If this doesn't work proceed you can add:  Senokot S  - start with 1 tablet two times a day and increase to 4 tablets two times a day if needed. (total of 8 tablets in a 24 hour period). This is a stimulant laxative.   Call us if this does not help your bowels move.   Imodium '2mg'$  capsule. Take 2 capsules after the 1st loose stool and then 1 capsule every 2 hours until you go a total of 12 hours without having a loose stool. Call the [facility] if loose stools continue. If diarrhea occurs @ bedtime, take 2 capsules @ bedtime. Then take 2 capsules every 4 hours until morning. Call [facility].     SYMPTOMS TO REPORT AS SOON AS POSSIBLE AFTER TREATMENT:  FEVER GREATER THAN 100.5 F  CHILLS WITH OR WITHOUT FEVER  NAUSEA AND VOMITING THAT IS NOT CONTROLLED WITH YOUR NAUSEA MEDICATION  UNUSUAL SHORTNESS OF BREATH  UNUSUAL BRUISING OR BLEEDING  TENDERNESS IN MOUTH AND THROAT WITH OR WITHOUT PRESENCE OF ULCERS  URINARY PROBLEMS  BOWEL PROBLEMS  UNUSUAL RASH    Wear comfortable clothing and clothing appropriate for easy access to any Portacath or PICC line. Let us know if there is anything that we can do to make your therapy better!      I have been informed and understand all of the instructions given to me and have received a copy. I  have been instructed to call the clinic [phone]  or my family physician as soon as possible for continued medical care, if indicated. I do not have any more questions at this time but understand that I may call the [facility] at [phone] during office hours should I have questions or need assistance in obtaining follow-up care.      Carboplatin injection What is this medicine? CARBOPLATIN (KAR boe pla tin) is a chemotherapy drug. It targets fast dividing cells, like cancer cells, and causes these cells to die. This medicine is used to treat ovarian cancer and many other cancers. This medicine may be used for other purposes; ask your health care provider or pharmacist if you have questions. What should I tell my health care provider before I take this medicine? They need to know if you have any of these conditions: -blood disorders -hearing problems -kidney disease -recent or ongoing radiation therapy -  an unusual or allergic reaction to carboplatin, cisplatin, other chemotherapy, other medicines, foods, dyes, or preservatives -pregnant or trying to get pregnant -breast-feeding How should I use this medicine? This drug is usually given as an infusion into a vein. It is administered in a hospital or clinic by a specially trained health care professional. Talk to your pediatrician regarding the use of this medicine in children. Special care may be needed. Overdosage: If you think you have taken too much of this medicine contact a poison control center or emergency room at once. NOTE: This medicine is only for you. Do not share this medicine with others. What if I miss a dose? It is important not to miss a dose. Call your doctor or health care professional if you are unable to keep an appointment. What may interact with this medicine? -medicines for seizures -medicines to increase blood counts like filgrastim, pegfilgrastim, sargramostim -some antibiotics like amikacin, gentamicin, neomycin,  streptomycin, tobramycin -vaccines Talk to your doctor or health care professional before taking any of these medicines: -acetaminophen -aspirin -ibuprofen -ketoprofen -naproxen This list may not describe all possible interactions. Give your health care provider a list of all the medicines, herbs, non-prescription drugs, or dietary supplements you use. Also tell them if you smoke, drink alcohol, or use illegal drugs. Some items may interact with your medicine. What should I watch for while using this medicine? Your condition will be monitored carefully while you are receiving this medicine. You will need important blood work done while you are taking this medicine. This drug may make you feel generally unwell. This is not uncommon, as chemotherapy can affect healthy cells as well as cancer cells. Report any side effects. Continue your course of treatment even though you feel ill unless your doctor tells you to stop. In some cases, you may be given additional medicines to help with side effects. Follow all directions for their use. Call your doctor or health care professional for advice if you get a fever, chills or sore throat, or other symptoms of a cold or flu. Do not treat yourself. This drug decreases your body's ability to fight infections. Try to avoid being around people who are sick. This medicine may increase your risk to bruise or bleed. Call your doctor or health care professional if you notice any unusual bleeding. Be careful brushing and flossing your teeth or using a toothpick because you may get an infection or bleed more easily. If you have any dental work done, tell your dentist you are receiving this medicine. Avoid taking products that contain aspirin, acetaminophen, ibuprofen, naproxen, or ketoprofen unless instructed by your doctor. These medicines may hide a fever. Do not become pregnant while taking this medicine. Women should inform their doctor if they wish to become  pregnant or think they might be pregnant. There is a potential for serious side effects to an unborn child. Talk to your health care professional or pharmacist for more information. Do not breast-feed an infant while taking this medicine. What side effects may I notice from receiving this medicine? Side effects that you should report to your doctor or health care professional as soon as possible: -allergic reactions like skin rash, itching or hives, swelling of the face, lips, or tongue -signs of infection - fever or chills, cough, sore throat, pain or difficulty passing urine -signs of decreased platelets or bleeding - bruising, pinpoint red spots on the skin, black, tarry stools, nosebleeds -signs of decreased red blood cells - unusually weak or  tired, fainting spells, lightheadedness -breathing problems -changes in hearing -changes in vision -chest pain -high blood pressure -low blood counts - This drug may decrease the number of white blood cells, red blood cells and platelets. You may be at increased risk for infections and bleeding. -nausea and vomiting -pain, swelling, redness or irritation at the injection site -pain, tingling, numbness in the hands or feet -problems with balance, talking, walking -trouble passing urine or change in the amount of urine Side effects that usually do not require medical attention (report to your doctor or health care professional if they continue or are bothersome): -hair loss -loss of appetite -metallic taste in the mouth or changes in taste This list may not describe all possible side effects. Call your doctor for medical advice about side effects. You may report side effects to FDA at 1-800-FDA-1088. Where should I keep my medicine? This drug is given in a hospital or clinic and will not be stored at home. NOTE: This sheet is a summary. It may not cover all possible information. If you have questions about this medicine, talk to your doctor,  pharmacist, or health care provider.    2016, Elsevier/Gold Standard. (2008-03-19 14:38:05)       Paclitaxel injection What is this medicine? PACLITAXEL (PAK li TAX el) is a chemotherapy drug. It targets fast dividing cells, like cancer cells, and causes these cells to die. This medicine is used to treat ovarian cancer, breast cancer, and other cancers. This medicine may be used for other purposes; ask your health care provider or pharmacist if you have questions. What should I tell my health care provider before I take this medicine? They need to know if you have any of these conditions: -blood disorders -irregular heartbeat -infection (especially a virus infection such as chickenpox, cold sores, or herpes) -liver disease -previous or ongoing radiation therapy -an unusual or allergic reaction to paclitaxel, alcohol, polyoxyethylated castor oil, other chemotherapy agents, other medicines, foods, dyes, or preservatives -pregnant or trying to get pregnant -breast-feeding How should I use this medicine? This drug is given as an infusion into a vein. It is administered in a hospital or clinic by a specially trained health care professional. Talk to your pediatrician regarding the use of this medicine in children. Special care may be needed. Overdosage: If you think you have taken too much of this medicine contact a poison control center or emergency room at once. NOTE: This medicine is only for you. Do not share this medicine with others. What if I miss a dose? It is important not to miss your dose. Call your doctor or health care professional if you are unable to keep an appointment. What may interact with this medicine? Do not take this medicine with any of the following medications: -disulfiram -metronidazole This medicine may also interact with the following medications: -cyclosporine -diazepam -ketoconazole -medicines to increase blood counts like filgrastim, pegfilgrastim,  sargramostim -other chemotherapy drugs like cisplatin, doxorubicin, epirubicin, etoposide, teniposide, vincristine -quinidine -testosterone -vaccines -verapamil Talk to your doctor or health care professional before taking any of these medicines: -acetaminophen -aspirin -ibuprofen -ketoprofen -naproxen This list may not describe all possible interactions. Give your health care provider a list of all the medicines, herbs, non-prescription drugs, or dietary supplements you use. Also tell them if you smoke, drink alcohol, or use illegal drugs. Some items may interact with your medicine. What should I watch for while using this medicine? Your condition will be monitored carefully while you are receiving this medicine.  You will need important blood work done while you are taking this medicine. This drug may make you feel generally unwell. This is not uncommon, as chemotherapy can affect healthy cells as well as cancer cells. Report any side effects. Continue your course of treatment even though you feel ill unless your doctor tells you to stop. This medicine can cause serious allergic reactions. To reduce your risk you will need to take other medicine(s) before treatment with this medicine. In some cases, you may be given additional medicines to help with side effects. Follow all directions for their use. Call your doctor or health care professional for advice if you get a fever, chills or sore throat, or other symptoms of a cold or flu. Do not treat yourself. This drug decreases your body's ability to fight infections. Try to avoid being around people who are sick. This medicine may increase your risk to bruise or bleed. Call your doctor or health care professional if you notice any unusual bleeding. Be careful brushing and flossing your teeth or using a toothpick because you may get an infection or bleed more easily. If you have any dental work done, tell your dentist you are receiving this  medicine. Avoid taking products that contain aspirin, acetaminophen, ibuprofen, naproxen, or ketoprofen unless instructed by your doctor. These medicines may hide a fever. Do not become pregnant while taking this medicine. Women should inform their doctor if they wish to become pregnant or think they might be pregnant. There is a potential for serious side effects to an unborn child. Talk to your health care professional or pharmacist for more information. Do not breast-feed an infant while taking this medicine. Men are advised not to father a child while receiving this medicine. This product may contain alcohol. Ask your pharmacist or healthcare provider if this medicine contains alcohol. Be sure to tell all healthcare providers you are taking this medicine. Certain medicines, like metronidazole and disulfiram, can cause an unpleasant reaction when taken with alcohol. The reaction includes flushing, headache, nausea, vomiting, sweating, and increased thirst. The reaction can last from 30 minutes to several hours. What side effects may I notice from receiving this medicine? Side effects that you should report to your doctor or health care professional as soon as possible: -allergic reactions like skin rash, itching or hives, swelling of the face, lips, or tongue -low blood counts - This drug may decrease the number of white blood cells, red blood cells and platelets. You may be at increased risk for infections and bleeding. -signs of infection - fever or chills, cough, sore throat, pain or difficulty passing urine -signs of decreased platelets or bleeding - bruising, pinpoint red spots on the skin, black, tarry stools, nosebleeds -signs of decreased red blood cells - unusually weak or tired, fainting spells, lightheadedness -breathing problems -chest pain -high or low blood pressure -mouth sores -nausea and vomiting -pain, swelling, redness or irritation at the injection site -pain, tingling,  numbness in the hands or feet -slow or irregular heartbeat -swelling of the ankle, feet, hands Side effects that usually do not require medical attention (report to your doctor or health care professional if they continue or are bothersome): -bone pain -complete hair loss including hair on your head, underarms, pubic hair, eyebrows, and eyelashes -changes in the color of fingernails -diarrhea -loosening of the fingernails -loss of appetite -muscle or joint pain -red flush to skin -sweating This list may not describe all possible side effects. Call your doctor for medical advice  about side effects. You may report side effects to FDA at 1-800-FDA-1088. Where should I keep my medicine? This drug is given in a hospital or clinic and will not be stored at home. NOTE: This sheet is a summary. It may not cover all possible information. If you have questions about this medicine, talk to your doctor, pharmacist, or health care provider.    2016, [employer] Standard. (2015-07-31 13:02:56)

## 2016-09-10 NOTE — Progress Notes (Signed)
Took pt to meet with Caryl Pina our hospital financial counselor.  She has some paperwork to fill out with her.  I also sent Abby Potash a message she has some questions about social security and disability.

## 2016-09-13 ENCOUNTER — Encounter (HOSPITAL_COMMUNITY): Payer: Self-pay | Admitting: Emergency Medicine

## 2016-09-13 NOTE — Progress Notes (Unsigned)
09/10/2016- teaching pulled together on carbo/taxol, pre-meds sent into [facility] in [place], labs entered, chemo/doctors schedule completed.

## 2016-09-13 NOTE — Patient Instructions (Signed)
Emily Pope  09/13/2016     '@PREFPERIOPPHARMACY'$ @   Your procedure is scheduled on 09/17/2016.  Report to [facility] at 6:15 A.M.  Call this number if you have problems the morning of surgery:  [phone]   Remember:  Do not eat food or drink liquids after midnight.  Take these medicines the morning of surgery with A SIP OF WATER Albuterol inhaler and bring with you to hospital, Wellbutrin, Claritin,  Zofran or compazine if needed, Tramadol if needed   Do not wear jewelry, make-up or nail polish.  Do not wear lotions, powders, or perfumes, or deoderant.  Do not shave 48 hours prior to surgery.  Men may shave face and neck.  Do not bring valuables to the hospital.  [facility] is not responsible for any belongings or valuables.  Contacts, dentures or bridgework may not be worn into surgery.  Leave your suitcase in the car.  After surgery it may be brought to your room.  For patients admitted to the hospital, discharge time will be determined by your treatment team.  Patients discharged the day of surgery will not be allowed to drive home.    Please read over the following fact sheets that you were given. Surgical Site Infection Prevention and Anesthesia Post-op Instructions     PATIENT INSTRUCTIONS POST-ANESTHESIA  IMMEDIATELY FOLLOWING SURGERY:  Do not drive or operate machinery for the first twenty four hours after surgery.  Do not make any important decisions for twenty four hours after surgery or while taking narcotic pain medications or sedatives.  If you develop intractable nausea and vomiting or a severe headache please notify your doctor immediately.  FOLLOW-UP:  Please make an appointment with your surgeon as instructed. You do not need to follow up with anesthesia unless specifically instructed to do so.  WOUND CARE INSTRUCTIONS (if applicable):  Keep a dry clean dressing on the anesthesia/puncture wound site if there is drainage.  Once the wound has quit  draining you may leave it open to air.  Generally you should leave the bandage intact for twenty four hours unless there is drainage.  If the epidural site drains for more than 36-48 hours please call the anesthesia department.  QUESTIONS?:  Please feel free to call your physician or the hospital operator if you have any questions, and they will be happy to assist you.      Implanted Port Insertion An implanted port is a central line that has a round shape and is placed under the skin. It is used as a long-term IV access for:   Medicines, such as chemotherapy.   Fluids.   Liquid nutrition, such as total parenteral nutrition (TPN).   Blood samples.  LET [facility] CARE PROVIDER KNOW ABOUT:  Allergies to food or medicine.   Medicines taken, including vitamins, herbs, eye drops, creams, and over-the-counter medicines.   Any allergies to heparin.  Use of steroids (by mouth or creams).   Previous problems with anesthetics or numbing medicines.   History of bleeding problems or blood clots.   Previous surgery.   Other health problems, including diabetes and kidney problems.   Possibility of pregnancy, if this applies. RISKS AND COMPLICATIONS Generally, this is a safe procedure. However, as with any procedure, problems can occur. Possible problems include:  Damage to the blood vessel, bruising, or bleeding at the puncture site.   Infection.  Blood clot in the vessel that the port is in.  Breakdown of the skin over your  port.  Very rarely a person may develop a condition called a pneumothorax, a collection of air in the chest that may cause one of the lungs to collapse. The placement of these catheters with the appropriate imaging guidance significantly decreases the risk of a pneumothorax.  BEFORE THE PROCEDURE   Your health care provider may want you to have blood tests. These tests can help tell how well your kidneys and liver are working. They can also show  how well your blood clots.   If you take blood thinners (anticoagulant medicines), ask your health care provider when you should stop taking them.   Make arrangements for someone to drive you home. This is necessary if you have been sedated for your procedure.  PROCEDURE  Port insertion usually takes about 30-45 minutes.   An IV needle will be inserted in your arm. Medicine for pain and medicine to help relax you (sedative) will flow directly into your body through this needle.   You will lie on an exam table, and you will be connected to monitors to keep track of your heart rate, blood pressure, and breathing throughout the procedure.  An oxygen monitoring device may be attached to your finger. Oxygen will be given.   Everything will be kept as germ free (sterile) as possible during the procedure. The skin near the point of the incision will be cleansed with antiseptic, and the area will be draped with sterile towels. The skin and deeper tissues over the port area will be made numb with a local anesthetic.  Two small cuts (incisions) will be made in the skin to insert the port. One will be made in the neck to obtain access to the vein where the catheter will lie.   Because the port reservoir will be placed under the skin, a small skin incision will be made in the upper chest, and a small pocket for the port will be made under the skin. The catheter that will be connected to the port tunnels to a large central vein in the chest. A small, raised area will remain on your body at the site of the reservoir when the procedure is complete.  The port placement will be done under imaging guidance to ensure the proper placement.  The reservoir has a silicone covering that can be punctured with a special needle.   The port will be flushed with normal saline, and blood will be drawn to make sure it is working properly.  There will be nothing remaining outside the skin when the procedure is  finished.   Incisions will be held together by stitches, surgical glue, or a special tape. AFTER THE PROCEDURE  You will stay in a recovery area until the anesthesia has worn off. Your blood pressure and pulse will be checked.  A final chest X-ray will be taken to check the placement of the port and to ensure that there is no injury to your lung.   This information is not intended to replace advice given to you by your health care provider. Make sure you discuss any questions you have with your health care provider.   Document Released: 10/03/2013 Document Revised: 01/03/2015 Document Reviewed: 10/03/2013 Elsevier Interactive Patient Education [employer].

## 2016-09-14 ENCOUNTER — Encounter (HOSPITAL_COMMUNITY)
Admission: RE | Admit: 2016-09-14 | Discharge: 2016-09-14 | Disposition: A | Discharge status: Home / Self Care | Payer: Medicaid Other | Source: Ambulatory Visit | Attending: General Surgery | Admitting: General Surgery

## 2016-09-14 ENCOUNTER — Encounter (HOSPITAL_COMMUNITY): Payer: Self-pay

## 2016-09-14 DIAGNOSIS — C3411 Malignant neoplasm of upper lobe, right bronchus or lung: Secondary | ICD-10-CM | POA: Diagnosis not present

## 2016-09-14 DIAGNOSIS — Z01818 Encounter for other preprocedural examination: Secondary | ICD-10-CM | POA: Diagnosis not present

## 2016-09-14 LAB — CBC
HEMATOCRIT: 36.2 % (ref 36.0–46.0)
HEMOGLOBIN: 12.3 g/dL (ref 12.0–15.0)
MCH: 32.1 pg (ref 26.0–34.0)
MCHC: 34 g/dL (ref 30.0–36.0)
MCV: 94.5 fL (ref 78.0–100.0)
Platelets: 458 10*3/uL — ABNORMAL HIGH (ref 150–400)
RBC: 3.83 MIL/uL — ABNORMAL LOW (ref 3.87–5.11)
RDW: 12.9 % (ref 11.5–15.5)
WBC: 7.9 10*3/uL (ref 4.0–10.5)

## 2016-09-14 LAB — BASIC METABOLIC PANEL
ANION GAP: 9 (ref 5–15)
BUN: 16 mg/dL (ref 6–20)
CALCIUM: 9.2 mg/dL (ref 8.9–10.3)
CHLORIDE: 101 mmol/L (ref 101–111)
CO2: 27 mmol/L (ref 22–32)
Creatinine, Ser: 0.81 mg/dL (ref 0.44–1.00)
GFR calc non Af Amer: >60 mL/min (ref >60)
Glucose, Bld: 98 mg/dL (ref 65–99)
Potassium: 4.1 mmol/L (ref 3.5–5.1)
Sodium: 137 mmol/L (ref 135–145)

## 2016-09-14 MED ORDER — FENTANYL CITRATE (PF) 100 MCG/2ML IJ SOLN: 25.0000 ug | INTRAMUSCULAR | Status: DC | PRN

## 2016-09-14 NOTE — H&P (Signed)
  NTS SOAP Note  Vital Signs:  Vitals as of: 08/13/4036: Systolic 543: Diastolic 72: Heart Rate 83: Temp 98.27F (Temporal): Height 47f 0in: Weight 117Lbs 0 Ounces: BMI 22.85   BMI : 22.85 kg/m2  Subjective: This 60year old female presents for of need for portacath placement.  Is about to undergo treatment for right lung cancer.  Referred by Dr. PWhitney Muse  Review of Symptoms:  Constitutional:negative Head:negative Eyes:negative Nose/Mouth/Throat:negative Cardiovascular:negative Respiratory:dyspnea Gastrointestinnegative Genitourinary:negative joint pain Skin:negative Hematolgic/Lymphatic:negative Allergic/Immunologic:negative   Past Medical History:Reviewed  Past Medical History  Surgical History: TAH, appendectomy, hernia repair, lung biopsy Medical Problems: COPD, right lung cancer, HTN, anxiety Allergies: codeine, PCN, sulfa, lincomycin Medications: proventil, prinizide, ultram, claritin   Social History:Reviewed  Social History  Preferred Language: English Race:  White Ethnicity: Not Hispanic / Latino Age: [age]year Marital Status:  S Alcohol: no   Smoking Status: Former smoker reviewed on 09/14/2016 Started Date:  Stopped Date: 06/26/2016 Functional Status reviewed on 09/14/2016 ------------------------------------------------ Bathing: Normal Cooking: Normal Dressing: Normal Driving: Normal Eating: Normal Managing Meds: Normal Oral Care: Normal Shopping: Normal Toileting: Normal Transferring: Normal Walking: Normal Cognitive Status reviewed on 09/14/2016 ------------------------------------------------ Attention: Normal Decision Making: Normal Language: Normal Memory: Normal Motor: Normal Perception: Normal Problem Solving: Normal Visual and Spatial: Normal   Family History:Reviewed  Family Health History Mother, Deceased; Lung cancer;  Father, Unknown; Hypertension (high blood pressure);     Objective  Information: General:Well appearing, well nourished in no distress. Skin:no rash or prominent lesions Head:Atraumatic; no masses; no abnormalities Neck:Supple without lymphadenopathy.  Heart:RRR, no murmur or gallop.  Normal S1, S2.  No S3, S4.  Lungs:CTA bilaterally, no wheezes, rhonchi, rales.  Breathing unlabored. Dr. PWhitney Museand Cancer Board notes reviewed. Assessment:Lung cancer, need for central venous access  Diagnoses: 162.3  C34.11 Primary malignant neoplasm of upper lobe, bronchus or lung (Malignant neoplasm of upper lobe, right bronchus or lung)  Procedures: [id number]- OFFICE OUTPATIENT NEW 30 MINUTES    Plan:  Scheduled for portacath insertion on 09/17/16.   Patient Education:Alternative treatments to surgery were discussed with patient (and family).Risks and benefits  of procedure bleeding, infection, and pneumothorax were fully explained to the patient (and family) who gave informed consent. Patient/family questions were addressed.  Follow-up:Pending Surgery

## 2016-09-15 ENCOUNTER — Encounter (HOSPITAL_COMMUNITY): Payer: Self-pay | Admitting: Emergency Medicine

## 2016-09-15 ENCOUNTER — Encounter (HOSPITAL_COMMUNITY): Payer: Medicaid Other

## 2016-09-15 DIAGNOSIS — C3411 Malignant neoplasm of upper lobe, right bronchus or lung: Secondary | ICD-10-CM

## 2016-09-15 MED ORDER — PROCHLORPERAZINE MALEATE 10 MG PO TABS: 10.0000 mg | ORAL_TABLET | Freq: Four times a day (QID) | ORAL | 1 refills | Status: DC | PRN

## 2016-09-15 MED ORDER — LIDOCAINE-PRILOCAINE 2.5-2.5 % EX CREA: 1.0000 "application " | TOPICAL_CREAM | CUTANEOUS | 3 refills | Status: DC | PRN

## 2016-09-15 MED ORDER — ONDANSETRON HCL 8 MG PO TABS: 8.0000 mg | ORAL_TABLET | Freq: Two times a day (BID) | ORAL | 1 refills | Status: DC | PRN

## 2016-09-15 NOTE — Progress Notes (Signed)
Walked with pt over to the journey room to look at wigs.  Also spoke with Angie about getting assistance with pre-meds that she cant afford until her medicaid kicks in.

## 2016-09-15 NOTE — Progress Notes (Signed)
Chemotherapy Schedule faxed to [facility]

## 2016-09-16 MED FILL — ONDANSETRON HCL 8 MG TABLET: 8 | 15 days supply | Qty: 30 | Fill #0

## 2016-09-16 MED FILL — PROCHLORPERAZINE 10 MG TAB: 10 | 7 days supply | Qty: 30 | Fill #0

## 2016-09-16 MED FILL — LIDOCAINE-PRILOCAINE CREAM: 2.5-2.5 | 10 days supply | Qty: 30 | Fill #0

## 2016-09-17 ENCOUNTER — Ambulatory Visit (HOSPITAL_COMMUNITY): Payer: Medicaid Other

## 2016-09-17 ENCOUNTER — Ambulatory Visit (HOSPITAL_COMMUNITY): Payer: Medicaid Other | Admitting: Anesthesiology

## 2016-09-17 ENCOUNTER — Ambulatory Visit (HOSPITAL_COMMUNITY)
Admission: RE | Admit: 2016-09-17 | Discharge: 2016-09-17 | Disposition: A | Discharge status: Home / Self Care | Payer: Medicaid Other | Source: Ambulatory Visit | Attending: General Surgery | Admitting: General Surgery

## 2016-09-17 ENCOUNTER — Encounter (HOSPITAL_COMMUNITY)
Admission: RE | Disposition: A | Discharge status: Home / Self Care | Payer: Self-pay | Source: Ambulatory Visit | Attending: General Surgery

## 2016-09-17 ENCOUNTER — Encounter (HOSPITAL_COMMUNITY): Payer: Self-pay | Admitting: Anesthesiology

## 2016-09-17 DIAGNOSIS — K219 Gastro-esophageal reflux disease without esophagitis: Secondary | ICD-10-CM | POA: Insufficient documentation

## 2016-09-17 DIAGNOSIS — C3491 Malignant neoplasm of unspecified part of right bronchus or lung: Secondary | ICD-10-CM | POA: Diagnosis present

## 2016-09-17 DIAGNOSIS — Z79899 Other long term (current) drug therapy: Secondary | ICD-10-CM | POA: Insufficient documentation

## 2016-09-17 DIAGNOSIS — J449 Chronic obstructive pulmonary disease, unspecified: Secondary | ICD-10-CM | POA: Diagnosis not present

## 2016-09-17 DIAGNOSIS — I10 Essential (primary) hypertension: Secondary | ICD-10-CM | POA: Diagnosis not present

## 2016-09-17 DIAGNOSIS — Z801 Family history of malignant neoplasm of trachea, bronchus and lung: Secondary | ICD-10-CM | POA: Insufficient documentation

## 2016-09-17 DIAGNOSIS — M1990 Unspecified osteoarthritis, unspecified site: Secondary | ICD-10-CM | POA: Diagnosis not present

## 2016-09-17 DIAGNOSIS — C3411 Malignant neoplasm of upper lobe, right bronchus or lung: Secondary | ICD-10-CM | POA: Insufficient documentation

## 2016-09-17 DIAGNOSIS — Z95828 Presence of other vascular implants and grafts: Secondary | ICD-10-CM

## 2016-09-17 DIAGNOSIS — Z87891 Personal history of nicotine dependence: Secondary | ICD-10-CM | POA: Diagnosis not present

## 2016-09-17 HISTORY — PX: PORTACATH PLACEMENT: SHX2246

## 2016-09-17 SURGERY — INSERTION, TUNNELED CENTRAL VENOUS DEVICE, WITH PORT
Anesthesia: Monitor Anesthesia Care | Site: Chest | Laterality: Left

## 2016-09-17 MED ORDER — HEPARIN SOD (PORK) LOCK FLUSH 100 UNIT/ML IV SOLN
INTRAVENOUS | Status: AC
  Filled 2016-09-17: qty 5

## 2016-09-17 MED ORDER — ONDANSETRON HCL 4 MG/2ML IJ SOLN: 4.0000 mg | Freq: Once | INTRAMUSCULAR | Status: DC

## 2016-09-17 MED ORDER — HYDROMORPHONE HCL 1 MG/ML IJ SOLN
0.2500 mg | INTRAMUSCULAR | Status: DC | PRN
  Filled 2016-09-17: qty 1

## 2016-09-17 MED ORDER — LIDOCAINE HCL (PF) 1 % IJ SOLN
INTRAMUSCULAR | Status: AC
  Filled 2016-09-17: qty 30

## 2016-09-17 MED ORDER — DIPHENHYDRAMINE HCL 50 MG/ML IJ SOLN
INTRAMUSCULAR | Status: AC
  Filled 2016-09-17: qty 1

## 2016-09-17 MED ORDER — PROPOFOL 10 MG/ML IV BOLUS
INTRAVENOUS | Status: DC | PRN
  Administered 2016-09-17: 10 mg via INTRAVENOUS

## 2016-09-17 MED ORDER — KETOROLAC TROMETHAMINE 30 MG/ML IJ SOLN
INTRAMUSCULAR | Status: AC
  Filled 2016-09-17: qty 1

## 2016-09-17 MED ORDER — DIPHENHYDRAMINE HCL 50 MG/ML IJ SOLN
25.0000 mg | Freq: Once | INTRAMUSCULAR | Status: AC
  Administered 2016-09-17: 25 mg via INTRAVENOUS

## 2016-09-17 MED ORDER — FENTANYL CITRATE (PF) 100 MCG/2ML IJ SOLN
INTRAMUSCULAR | Status: AC
  Filled 2016-09-17: qty 2

## 2016-09-17 MED ORDER — HEPARIN SOD (PORK) LOCK FLUSH 100 UNIT/ML IV SOLN
INTRAVENOUS | Status: DC | PRN
  Administered 2016-09-17: 500 [IU] via INTRAVENOUS

## 2016-09-17 MED ORDER — LIDOCAINE HCL (PF) 1 % IJ SOLN
INTRAMUSCULAR | Status: DC | PRN
  Administered 2016-09-17: 6 mL

## 2016-09-17 MED ORDER — CHLORHEXIDINE GLUCONATE CLOTH 2 % EX PADS: 6.0000 | MEDICATED_PAD | Freq: Once | CUTANEOUS | Status: DC

## 2016-09-17 MED ORDER — LACTATED RINGERS IV SOLN
INTRAVENOUS | Status: DC
Start: 2016-09-17 — End: 2016-09-17
  Administered 2016-09-17: 07:00:00 via INTRAVENOUS

## 2016-09-17 MED ORDER — KETOROLAC TROMETHAMINE 30 MG/ML IJ SOLN
30.0000 mg | Freq: Once | INTRAMUSCULAR | Status: AC
  Administered 2016-09-17: 30 mg via INTRAVENOUS

## 2016-09-17 MED ORDER — FENTANYL CITRATE (PF) 100 MCG/2ML IJ SOLN
25.0000 ug | INTRAMUSCULAR | Status: DC | PRN
  Administered 2016-09-17: 25 ug via INTRAVENOUS

## 2016-09-17 MED ORDER — MIDAZOLAM HCL 2 MG/2ML IJ SOLN
INTRAMUSCULAR | Status: AC
  Filled 2016-09-17: qty 2

## 2016-09-17 MED ORDER — MIDAZOLAM HCL 5 MG/5ML IJ SOLN
INTRAMUSCULAR | Status: DC | PRN
  Administered 2016-09-17: 1 mg via INTRAVENOUS

## 2016-09-17 MED ORDER — TRAMADOL HCL 50 MG PO TABS: 50.0000 mg | ORAL_TABLET | Freq: Four times a day (QID) | ORAL | 0 refills | Status: DC | PRN

## 2016-09-17 MED ORDER — PROPOFOL 10 MG/ML IV BOLUS
INTRAVENOUS | Status: AC
  Filled 2016-09-17: qty 20

## 2016-09-17 MED ORDER — PROPOFOL 500 MG/50ML IV EMUL
INTRAVENOUS | Status: DC | PRN
  Administered 2016-09-17: 75 ug/kg/min via INTRAVENOUS

## 2016-09-17 MED ORDER — VANCOMYCIN HCL IN DEXTROSE 1-5 GM/200ML-% IV SOLN
INTRAVENOUS | Status: AC
  Filled 2016-09-17: qty 200

## 2016-09-17 MED ORDER — SODIUM CHLORIDE 0.9 % IJ SOLN
INTRAMUSCULAR | Status: DC | PRN
  Administered 2016-09-17: 500 mL

## 2016-09-17 MED ORDER — MIDAZOLAM HCL 2 MG/2ML IJ SOLN
1.0000 mg | INTRAMUSCULAR | Status: DC | PRN
  Administered 2016-09-17: 2 mg via INTRAVENOUS

## 2016-09-17 MED ORDER — VANCOMYCIN HCL IN DEXTROSE 1-5 GM/200ML-% IV SOLN: 1000.0000 mg | INTRAVENOUS | Status: DC

## 2016-09-17 SURGICAL SUPPLY — 30 items
BAG DECANTER FOR FLEXI CONT (MISCELLANEOUS) ×2 IMPLANT
BAG HAMPER (MISCELLANEOUS) ×2 IMPLANT
CHLORAPREP W/TINT 10.5 ML (MISCELLANEOUS) ×2 IMPLANT
CLOTH BEACON ORANGE TIMEOUT ST (SAFETY) ×2 IMPLANT
COVER LIGHT HANDLE STERIS (MISCELLANEOUS) ×4 IMPLANT
DECANTER SPIKE VIAL GLASS SM (MISCELLANEOUS) ×2 IMPLANT
DERMABOND ADVANCED (GAUZE/BANDAGES/DRESSINGS) ×1
DERMABOND ADVANCED .7 DNX12 (GAUZE/BANDAGES/DRESSINGS) ×1 IMPLANT
DRAPE C-ARM FOLDED MOBILE STRL (DRAPES) ×2 IMPLANT
ELECT REM PT RETURN 9FT ADLT (ELECTROSURGICAL) ×2
ELECTRODE REM PT RTRN 9FT ADLT (ELECTROSURGICAL) ×1 IMPLANT
GLOVE BIOGEL PI IND STRL 7.0 (GLOVE) ×2 IMPLANT
GLOVE BIOGEL PI INDICATOR 7.0 (GLOVE) ×2
GLOVE EXAM NITRILE MD LF STRL (GLOVE) ×2 IMPLANT
GLOVE SURG SS PI 7.5 STRL IVOR (GLOVE) ×2 IMPLANT
GOWN STRL REUS W/TWL LRG LVL3 (GOWN DISPOSABLE) ×6 IMPLANT
IV NS 500ML (IV SOLUTION) ×1
IV NS 500ML BAXH (IV SOLUTION) ×1 IMPLANT
KIT PORT POWER 8FR ISP MRI (Port) ×2 IMPLANT
KIT ROOM TURNOVER APOR (KITS) ×2 IMPLANT
MANIFOLD NEPTUNE II (INSTRUMENTS) ×2 IMPLANT
NEEDLE HYPO 25X1 1.5 SAFETY (NEEDLE) ×2 IMPLANT
PACK MINOR (CUSTOM PROCEDURE TRAY) ×2 IMPLANT
PAD ARMBOARD 7.5X6 YLW CONV (MISCELLANEOUS) ×2 IMPLANT
SET BASIN LINEN APH (SET/KITS/TRAYS/PACK) ×2 IMPLANT
SUT VIC AB 3-0 SH 27 (SUTURE) ×1
SUT VIC AB 3-0 SH 27X BRD (SUTURE) ×1 IMPLANT
SUT VIC AB 4-0 PS2 27 (SUTURE) ×2 IMPLANT
SYR 20CC LL (SYRINGE) ×2 IMPLANT
SYR CONTROL 10ML LL (SYRINGE) ×2 IMPLANT

## 2016-09-17 NOTE — Transfer of Care (Signed)
Immediate Anesthesia Transfer of Care Note  Patient: Emily Pope  Procedure(s) Performed: Procedure(s): INSERTION PORT-A-CATH LEFT SUBCLAVIAN (Left)  Patient Location: PACU  Anesthesia Type:MAC  Level of Consciousness: awake and alert   Airway & Oxygen Therapy: Patient Spontanous Breathing  Post-op Assessment: Report given to RN  Post vital signs: Reviewed and stable  Last Vitals:  Vitals:   09/17/16 0715 09/17/16 0730  BP: 124/79   Pulse:    Resp:  (!) 0  Temp:      Last Pain:  Vitals:   09/17/16 0627  TempSrc: Oral  PainSc: 3       Patients Stated Pain Goal: 6 (04/88/89 1694)  Complications: No apparent anesthesia complications

## 2016-09-17 NOTE — Progress Notes (Signed)
CXR done

## 2016-09-17 NOTE — Progress Notes (Signed)
C/O scalp itching "bad". Dr Patsey Berthold notified. Order given for benadryl.

## 2016-09-17 NOTE — Anesthesia Preprocedure Evaluation (Signed)
Anesthesia Evaluation  Patient identified by MRN, date of birth, ID band Patient awake    Reviewed: Allergy & Precautions, NPO status , Patient's Chart, lab work & pertinent test results  Airway Mallampati: II  TM Distance: >3 FB     Dental  (+) Poor Dentition   Pulmonary pneumonia, COPD, former smoker,  R Lung Cancer, started RT    breath sounds clear to auscultation       Cardiovascular hypertension, Pt. on medications  Rhythm:Regular Rate:Normal     Neuro/Psych  Headaches, Anxiety    GI/Hepatic Neg liver ROS, GERD  Controlled and Medicated,  Endo/Other  negative endocrine ROS  Renal/GU negative Renal ROS     Musculoskeletal  (+) Arthritis ,   Abdominal   Peds  Hematology   Anesthesia Other Findings   Reproductive/Obstetrics                             Anesthesia Physical Anesthesia Plan  ASA: III  Anesthesia Plan: MAC   Post-op Pain Management:    Induction: Intravenous  Airway Management Planned: Simple Face Mask  Additional Equipment:   Intra-op Plan:   Post-operative Plan:   Informed Consent: I have reviewed the patients History and Physical, chart, labs and discussed the procedure including the risks, benefits and alternatives for the proposed anesthesia with the patient or authorized representative who has indicated his/her understanding and acceptance.     Plan Discussed with:   Anesthesia Plan Comments:         Anesthesia Quick Evaluation

## 2016-09-17 NOTE — Anesthesia Postprocedure Evaluation (Signed)
Anesthesia Post Note  Patient: Emily Pope  Procedure(s) Performed: Procedure(s) (LRB): INSERTION PORT-A-CATH LEFT SUBCLAVIAN (Left)  Patient location during evaluation: PACU Anesthesia Type: MAC Level of consciousness: awake and alert and oriented Pain management: pain level controlled Vital Signs Assessment: post-procedure vital signs reviewed and stable Respiratory status: spontaneous breathing Cardiovascular status: blood pressure returned to baseline Postop Assessment: no signs of nausea or vomiting Anesthetic complications: no    Last Vitals:  Vitals:   09/17/16 0715 09/17/16 0730  BP: 124/79   Pulse:    Resp:  (!) 0  Temp:      Last Pain:  Vitals:   09/17/16 0627  TempSrc: Oral  PainSc: 3                  Kendi Defalco

## 2016-09-17 NOTE — Op Note (Signed)
Patient:  Emily Pope  DOB:  Feb 11, 1956  MRN:  [id number]   Preop Diagnosis:  Right lung carcinoma  Postop Diagnosis:  Same  Procedure:  Port-A-Cath insertion  Surgeon:  Aviva Signs, M.D.  Anes:  Mac  Indications:  Patient is a 60 year old white female who is about to undergo chemotherapy for right lung carcinoma. The risks and benefits of the procedure including bleeding, infection, and pneumothorax were fully explained to the patient, who gave informed consent.  Procedure note:  The patient was placed in Trendelenburg position after the left upper chest was prepped and draped using the usual sterile technique with DuraPrep. Surgical site confirmation was performed. 1% Xylocaine was used for local anesthesia.  An incision was made below the left clavicle. Subcutaneous pocket was then formed. A needle is advanced into the left subclavian vein using the Seldinger technique without difficulty. A guidewire was then advanced into the right atrium under fluoroscopic guidance. An introducer and peel-away sheath were placed over the guidewire. The catheter was then inserted through the peel-away sheath the peel-away sheath was removed. The catheter was then attached to the port and the port placed in subcutaneous pocket. Adequate positioning was confirmed by fluoroscopy. Good backflow blood was noted in the port. A power port was inserted. The port was flushed with heparin flush. The subcutaneous layer was reapproximated using a 3-0 Vicryl interrupted suture. The skin was closed using a 4-0 Vicryl subcuticular suture. Dermabond was then applied.  All tape and needle counts were correct at the end of the procedure. Patient was transferred to PACU in stable condition. A chest x-ray will be performed at that time.  Complications:  None  EBL:  Minimal  Specimen:  None

## 2016-09-17 NOTE — Interval H&P Note (Signed)
History and Physical Interval Note:  09/17/2016 7:22 AM  Emily Pope  has presented today for surgery, with the diagnosis of non small cell lung cancer  The various methods of treatment have been discussed with the patient and family. After consideration of risks, benefits and other options for treatment, the patient has consented to  Procedure(s): INSERTION PORT-A-CATH (N/A) as a surgical intervention .  The patient's history has been reviewed, patient examined, no change in status, stable for surgery.  I have reviewed the patient's chart and labs.  Questions were answered to the patient's satisfaction.     Aviva Signs A

## 2016-09-17 NOTE — Discharge Instructions (Signed)
Implanted Port Insertion, Care After °Refer to this sheet in the next few weeks. These instructions provide you with information on caring for yourself after your procedure. Your health care provider may also give you more specific instructions. Your treatment has been planned according to current medical practices, but problems sometimes occur. Call your health care provider if you have any problems or questions after your procedure. °WHAT TO EXPECT AFTER THE PROCEDURE °After your procedure, it is typical to have the following:  °· Discomfort at the port insertion site. Ice packs to the area will help. °· Bruising on the skin over the port. This will subside in 3-4 days. °HOME CARE INSTRUCTIONS °· After your port is placed, you will get a manufacturer's information card. The card has information about your port. Keep this card with you at all times.   °· Know what kind of port you have. There are many types of ports available.   °· Wear a medical alert bracelet in case of an emergency. This can help alert health care workers that you have a port.   °· The port can stay in for as long as your health care provider believes it is necessary.   °· A home health care nurse may give medicines and take care of the port.   °· You or a family member can get special training and directions for giving medicine and taking care of the port at home.   °SEEK MEDICAL CARE IF:  °· Your port does not flush or you are unable to get a blood return.   °· You have a fever or chills. °SEEK IMMEDIATE MEDICAL CARE IF: °· You have new fluid or pus coming from your incision.   °· You notice a bad smell coming from your incision site.   °· You have swelling, pain, or more redness at the incision or port site.   °· You have chest pain or shortness of breath. °  °This information is not intended to replace advice given to you by your health care provider. Make sure you discuss any questions you have with your health care provider. °  °Document  Released: 10/03/2013 Document Revised: 12/18/2013 Document Reviewed: 10/03/2013 °Elsevier Interactive Patient Education ©2016 Elsevier Inc[employer] °Implanted Port Home Guide °An implanted port is a type of central line that is placed under the skin. Central lines are used to provide IV access when treatment or nutrition needs to be given through a person's veins. Implanted ports are used for long-term IV access. An implanted port may be placed because:  °· You need IV medicine that would be irritating to the small veins in your hands or arms.   °· You need long-term IV medicines, such as antibiotics.   °· You need IV nutrition for a long period.   °· You need frequent blood draws for lab tests.   °· You need dialysis.   °Implanted ports are usually placed in the chest area, but they can also be placed in the upper arm, the abdomen, or the leg. An implanted port has two main parts:  °· Reservoir. The reservoir is round and will appear as a small, raised area under your skin. The reservoir is the part where a needle is inserted to give medicines or draw blood.   °· Catheter. The catheter is a thin, flexible tube that extends from the reservoir. The catheter is placed into a large vein. Medicine that is inserted into the reservoir goes into the catheter and then into the vein.   °HOW WILL I CARE FOR MY INCISION SITE? °Do not get the   incision site wet. Bathe or shower as directed by your health care provider.  °HOW IS MY PORT ACCESSED? °Special steps must be taken to access the port:  °· Before the port is accessed, a numbing cream can be placed on the skin. This helps numb the skin over the port site.   °· Your health care provider uses a sterile technique to access the port. °· Your health care provider must put on a mask and sterile gloves. °· The skin over your port is cleaned carefully with an antiseptic and allowed to dry. °· The port is gently pinched between sterile gloves, and a needle is inserted into the  port. °· Only "non-coring" port needles should be used to access the port. Once the port is accessed, a blood return should be checked. This helps ensure that the port is in the vein and is not clogged.   °· If your port needs to remain accessed for a constant infusion, a clear (transparent) bandage will be placed over the needle site. The bandage and needle will need to be changed every week, or as directed by your health care provider.   °· Keep the bandage covering the needle clean and dry. Do not get it wet. Follow your health care provider's instructions on how to take a shower or bath while the port is accessed.   °· If your port does not need to stay accessed, no bandage is needed over the port.   °WHAT IS FLUSHING? °Flushing helps keep the port from getting clogged. Follow your health care provider's instructions on how and when to flush the port. Ports are usually flushed with saline solution or a medicine called heparin. The need for flushing will depend on how the port is used.  °· If the port is used for intermittent medicines or blood draws, the port will need to be flushed:   °· After medicines have been given.   °· After blood has been drawn.   °· As part of routine maintenance.   °· If a constant infusion is running, the port may not need to be flushed.   °HOW LONG WILL MY PORT STAY IMPLANTED? °The port can stay in for as long as your health care provider thinks it is needed. When it is time for the port to come out, surgery will be done to remove it. The procedure is similar to the one performed when the port was put in.  °WHEN SHOULD I SEEK IMMEDIATE MEDICAL CARE? °When you have an implanted port, you should seek immediate medical care if:  °· You notice a bad smell coming from the incision site.   °· You have swelling, redness, or drainage at the incision site.   °· You have more swelling or pain at the port site or the surrounding area.   °· You have a fever that is not controlled with  medicine. °  °This information is not intended to replace advice given to you by your health care provider. Make sure you discuss any questions you have with your health care provider. °  °Document Released: 12/13/2005 Document Revised: 10/03/2013 Document Reviewed: 08/20/2013 °Elsevier Interactive Patient Education ©2016 Elsevier Inc[employer] ° °

## 2016-09-20 ENCOUNTER — Ambulatory Visit (HOSPITAL_COMMUNITY): Payer: Self-pay

## 2016-09-20 ENCOUNTER — Encounter (HOSPITAL_COMMUNITY): Payer: Self-pay | Admitting: Emergency Medicine

## 2016-09-20 ENCOUNTER — Encounter (HOSPITAL_BASED_OUTPATIENT_CLINIC_OR_DEPARTMENT_OTHER): Payer: Medicaid Other

## 2016-09-20 VITALS — BP 130/72 | HR 87 | Temp 98.1°F | Resp 18 | Wt 118.8 lb

## 2016-09-20 DIAGNOSIS — C3411 Malignant neoplasm of upper lobe, right bronchus or lung: Secondary | ICD-10-CM | POA: Diagnosis present

## 2016-09-20 DIAGNOSIS — Z9221 Personal history of antineoplastic chemotherapy: Secondary | ICD-10-CM | POA: Diagnosis not present

## 2016-09-20 DIAGNOSIS — Z5111 Encounter for antineoplastic chemotherapy: Secondary | ICD-10-CM | POA: Diagnosis present

## 2016-09-20 LAB — COMPREHENSIVE METABOLIC PANEL
ALBUMIN: 3.6 g/dL (ref 3.5–5.0)
ALK PHOS: 129 U/L — AB (ref 38–126)
ALT: 10 U/L — AB (ref 14–54)
AST: 13 U/L — ABNORMAL LOW (ref 15–41)
Anion gap: 7 (ref 5–15)
BUN: 15 mg/dL (ref 6–20)
CALCIUM: 9 mg/dL (ref 8.9–10.3)
CO2: 28 mmol/L (ref 22–32)
CREATININE: 0.63 mg/dL (ref 0.44–1.00)
Chloride: 103 mmol/L (ref 101–111)
GFR calc Af Amer: >60 mL/min (ref >60)
GFR calc non Af Amer: >60 mL/min (ref >60)
GLUCOSE: 78 mg/dL (ref 65–99)
Potassium: 3.8 mmol/L (ref 3.5–5.1)
SODIUM: 138 mmol/L (ref 135–145)
Total Bilirubin: 0.4 mg/dL (ref 0.3–1.2)
Total Protein: 7.2 g/dL (ref 6.5–8.1)

## 2016-09-20 LAB — CBC WITH DIFFERENTIAL/PLATELET
BASOS PCT: 1 %
Basophils Absolute: 0.1 10*3/uL (ref 0.0–0.1)
EOS ABS: 0.5 10*3/uL (ref 0.0–0.7)
Eosinophils Relative: 7 %
HCT: 33 % — ABNORMAL LOW (ref 36.0–46.0)
HEMOGLOBIN: 11.1 g/dL — AB (ref 12.0–15.0)
Lymphocytes Relative: 22 %
Lymphs Abs: 1.6 10*3/uL (ref 0.7–4.0)
MCH: 31.4 pg (ref 26.0–34.0)
MCHC: 33.6 g/dL (ref 30.0–36.0)
MCV: 93.5 fL (ref 78.0–100.0)
Monocytes Absolute: 0.4 10*3/uL (ref 0.1–1.0)
Monocytes Relative: 5 %
NEUTROS PCT: 65 %
Neutro Abs: 4.5 10*3/uL (ref 1.7–7.7)
Platelets: 330 10*3/uL (ref 150–400)
RBC: 3.53 MIL/uL — AB (ref 3.87–5.11)
RDW: 12.9 % (ref 11.5–15.5)
WBC: 6.9 10*3/uL (ref 4.0–10.5)

## 2016-09-20 MED ORDER — PALONOSETRON HCL INJECTION 0.25 MG/5ML
0.2500 mg | Freq: Once | INTRAVENOUS | Status: AC
  Administered 2016-09-20: 0.25 mg via INTRAVENOUS
  Filled 2016-09-20: qty 5

## 2016-09-20 MED ORDER — DIPHENHYDRAMINE HCL 50 MG/ML IJ SOLN
50.0000 mg | Freq: Once | INTRAMUSCULAR | Status: AC
  Administered 2016-09-20: 50 mg via INTRAVENOUS
  Filled 2016-09-20: qty 1

## 2016-09-20 MED ORDER — PACLITAXEL CHEMO INJECTION 300 MG/50ML
45.0000 mg/m2 | Freq: Once | INTRAVENOUS | Status: AC
  Administered 2016-09-20: 66 mg via INTRAVENOUS
  Filled 2016-09-20: qty 11

## 2016-09-20 MED ORDER — SODIUM CHLORIDE 0.9% FLUSH: 10.0000 mL | INTRAVENOUS | Status: DC | PRN

## 2016-09-20 MED ORDER — SODIUM CHLORIDE 0.9 % IV SOLN
20.0000 mg | Freq: Once | INTRAVENOUS | Status: AC
  Administered 2016-09-20: 20 mg via INTRAVENOUS
  Filled 2016-09-20: qty 2

## 2016-09-20 MED ORDER — SODIUM CHLORIDE 0.9 % IV SOLN
Freq: Once | INTRAVENOUS | Status: AC
  Administered 2016-09-20: 11:00:00 via INTRAVENOUS

## 2016-09-20 MED ORDER — SODIUM CHLORIDE 0.9 % IV SOLN
176.6000 mg | Freq: Once | INTRAVENOUS | Status: AC
  Administered 2016-09-20: 180 mg via INTRAVENOUS
  Filled 2016-09-20: qty 18

## 2016-09-20 MED ORDER — FAMOTIDINE IN NACL 20-0.9 MG/50ML-% IV SOLN
20.0000 mg | Freq: Once | INTRAVENOUS | Status: AC
  Administered 2016-09-20: 20 mg via INTRAVENOUS
  Filled 2016-09-20: qty 50

## 2016-09-20 MED ORDER — HEPARIN SOD (PORK) LOCK FLUSH 100 UNIT/ML IV SOLN
500.0000 [IU] | Freq: Once | INTRAVENOUS | Status: AC | PRN
  Administered 2016-09-20: 500 [IU]
  Filled 2016-09-20: qty 5

## 2016-09-20 NOTE — Progress Notes (Signed)
Patient tolerated infusion well.  VSS throughout.  Patient ambulatory and stable upon discharge from clinic.

## 2016-09-20 NOTE — Progress Notes (Signed)
Went to check on pt during first chemotherapy treatment.  Also picked up her medications that we had ordered and helped her pay for.  Pt is nervous but seems to be doing well.  No questions right now.

## 2016-09-20 NOTE — Patient Instructions (Signed)
[facility] Discharge Instructions for Patients Receiving Chemotherapy   Beginning January 23rd 2017 lab work for the [facility] will be done in the  Main lab at [facility] on 1st floor. If you have a lab appointment with the [facility] please come in thru the  Main Entrance and check in at the main information desk   Today you received the following chemotherapy agents: Carboplatin and Taxol.   Please call us with any questions or concerns or any symptoms that you can't resolve on your own.     If you develop nausea and vomiting, or diarrhea that is not controlled by your medication, call the clinic.  The clinic phone number is [phone]. Office hours are Monday-Friday 8:30am-5:00pm.  BELOW ARE SYMPTOMS THAT SHOULD BE REPORTED IMMEDIATELY:  *FEVER GREATER THAN 101.0 F  *CHILLS WITH OR WITHOUT FEVER  NAUSEA AND VOMITING THAT IS NOT CONTROLLED WITH YOUR NAUSEA MEDICATION  *UNUSUAL SHORTNESS OF BREATH  *UNUSUAL BRUISING OR BLEEDING  TENDERNESS IN MOUTH AND THROAT WITH OR WITHOUT PRESENCE OF ULCERS  *URINARY PROBLEMS  *BOWEL PROBLEMS  UNUSUAL RASH Items with * indicate a potential emergency and should be followed up as soon as possible. If you have an emergency after office hours please contact your primary care physician or go to the nearest emergency department.  Please call the clinic during office hours if you have any questions or concerns.   You may also contact the Patient Navigator at [phone] should you have any questions or need assistance in obtaining follow up care.      Resources For Cancer Patients and their Caregivers ? American Cancer Society: Can assist with transportation, wigs, general needs, runs Look Good Feel Better.        321-335-0289 ? Cancer Care: Provides financial assistance, online support groups, medication/co-pay assistance.  1-800-813-HOPE 9367729950) ? [place] Assists [place] cancer patients and their families through emotional , educational and financial support.  631-087-4251 ? [place] DSS Where to apply for food stamps, Medicaid and utility assistance. 780-222-1716 ? RCATS: Transportation to medical appointments. 6360787187 ? Social Security Administration: May apply for disability if have a Stage IV cancer. [phone] ? [employer], Disability and Transit Services: Assists with nutrition, care and transit needs. [phone]

## 2016-09-21 ENCOUNTER — Telehealth (HOSPITAL_COMMUNITY): Payer: Self-pay | Admitting: *Deleted

## 2016-09-21 NOTE — Telephone Encounter (Signed)
Spoke with patient. Reports her face and chest were red and flushed feeling today. Explained to patient it is an effect of the IV dexamethasone from yesterday. Denies any side effects from chemo other than slightly fatigued today. Encouraged to report any issues/concerns to clinic as needed prior to next appointment.

## 2016-09-22 ENCOUNTER — Encounter (HOSPITAL_COMMUNITY): Payer: Self-pay | Admitting: General Surgery

## 2016-09-27 ENCOUNTER — Encounter (HOSPITAL_COMMUNITY): Payer: Medicaid Other | Attending: Hematology & Oncology

## 2016-09-27 ENCOUNTER — Encounter (HOSPITAL_COMMUNITY): Payer: Self-pay | Admitting: Oncology

## 2016-09-27 ENCOUNTER — Encounter (HOSPITAL_BASED_OUTPATIENT_CLINIC_OR_DEPARTMENT_OTHER): Payer: Medicaid Other | Admitting: Oncology

## 2016-09-27 VITALS — BP 120/73 | HR 91 | Temp 97.5°F | Resp 18 | Wt 119.4 lb

## 2016-09-27 DIAGNOSIS — Z9221 Personal history of antineoplastic chemotherapy: Secondary | ICD-10-CM | POA: Insufficient documentation

## 2016-09-27 DIAGNOSIS — C3411 Malignant neoplasm of upper lobe, right bronchus or lung: Secondary | ICD-10-CM | POA: Diagnosis not present

## 2016-09-27 DIAGNOSIS — Z5111 Encounter for antineoplastic chemotherapy: Secondary | ICD-10-CM | POA: Diagnosis not present

## 2016-09-27 LAB — CBC WITH DIFFERENTIAL/PLATELET
Basophils Absolute: 0.1 10*3/uL (ref 0.0–0.1)
Basophils Relative: 1 %
EOS PCT: 9 %
Eosinophils Absolute: 0.5 10*3/uL (ref 0.0–0.7)
HCT: 34.3 % — ABNORMAL LOW (ref 36.0–46.0)
Hemoglobin: 11.6 g/dL — ABNORMAL LOW (ref 12.0–15.0)
LYMPHS ABS: 0.9 10*3/uL (ref 0.7–4.0)
LYMPHS PCT: 15 %
MCH: 31.4 pg (ref 26.0–34.0)
MCHC: 33.8 g/dL (ref 30.0–36.0)
MCV: 93 fL (ref 78.0–100.0)
MONO ABS: 0.3 10*3/uL (ref 0.1–1.0)
Monocytes Relative: 6 %
Neutro Abs: 4.1 10*3/uL (ref 1.7–7.7)
Neutrophils Relative %: 69 %
PLATELETS: 340 10*3/uL (ref 150–400)
RBC: 3.69 MIL/uL — ABNORMAL LOW (ref 3.87–5.11)
RDW: 12.6 % (ref 11.5–15.5)
WBC: 5.9 10*3/uL (ref 4.0–10.5)

## 2016-09-27 LAB — COMPREHENSIVE METABOLIC PANEL
ALT: 11 U/L — ABNORMAL LOW (ref 14–54)
AST: 16 U/L (ref 15–41)
Albumin: 3.8 g/dL (ref 3.5–5.0)
Alkaline Phosphatase: 132 U/L — ABNORMAL HIGH (ref 38–126)
Anion gap: 3 — ABNORMAL LOW (ref 5–15)
BUN: 14 mg/dL (ref 6–20)
CHLORIDE: 106 mmol/L (ref 101–111)
CO2: 28 mmol/L (ref 22–32)
CREATININE: 0.73 mg/dL (ref 0.44–1.00)
Calcium: 8.5 mg/dL — ABNORMAL LOW (ref 8.9–10.3)
Glucose, Bld: 106 mg/dL — ABNORMAL HIGH (ref 65–99)
POTASSIUM: 3.9 mmol/L (ref 3.5–5.1)
Sodium: 137 mmol/L (ref 135–145)
Total Bilirubin: 0.2 mg/dL — ABNORMAL LOW (ref 0.3–1.2)
Total Protein: 7.3 g/dL (ref 6.5–8.1)

## 2016-09-27 MED ORDER — SODIUM CHLORIDE 0.9 % IV SOLN
178.2000 mg | Freq: Once | INTRAVENOUS | Status: AC
  Administered 2016-09-27: 180 mg via INTRAVENOUS
  Filled 2016-09-27: qty 18

## 2016-09-27 MED ORDER — FAMOTIDINE IN NACL 20-0.9 MG/50ML-% IV SOLN
20.0000 mg | Freq: Once | INTRAVENOUS | Status: AC
  Administered 2016-09-27: 20 mg via INTRAVENOUS

## 2016-09-27 MED ORDER — DIPHENHYDRAMINE HCL 50 MG/ML IJ SOLN
INTRAMUSCULAR | Status: AC
  Filled 2016-09-27: qty 1

## 2016-09-27 MED ORDER — PACLITAXEL CHEMO INJECTION 300 MG/50ML
45.0000 mg/m2 | Freq: Once | INTRAVENOUS | Status: AC
  Administered 2016-09-27: 66 mg via INTRAVENOUS
  Filled 2016-09-27: qty 11

## 2016-09-27 MED ORDER — PALONOSETRON HCL INJECTION 0.25 MG/5ML
INTRAVENOUS | Status: AC
  Filled 2016-09-27: qty 5

## 2016-09-27 MED ORDER — DIPHENHYDRAMINE HCL 50 MG/ML IJ SOLN
50.0000 mg | Freq: Once | INTRAMUSCULAR | Status: AC
  Administered 2016-09-27: 50 mg via INTRAVENOUS

## 2016-09-27 MED ORDER — FAMOTIDINE IN NACL 20-0.9 MG/50ML-% IV SOLN
INTRAVENOUS | Status: AC
  Filled 2016-09-27: qty 50

## 2016-09-27 MED ORDER — SODIUM CHLORIDE 0.9 % IV SOLN
Freq: Once | INTRAVENOUS | Status: AC
  Administered 2016-09-27: 11:00:00 via INTRAVENOUS

## 2016-09-27 MED ORDER — PALONOSETRON HCL INJECTION 0.25 MG/5ML
0.2500 mg | Freq: Once | INTRAVENOUS | Status: AC
  Administered 2016-09-27: 0.25 mg via INTRAVENOUS

## 2016-09-27 MED ORDER — HEPARIN SOD (PORK) LOCK FLUSH 100 UNIT/ML IV SOLN
INTRAVENOUS | Status: AC
  Filled 2016-09-27: qty 5

## 2016-09-27 MED ORDER — SODIUM CHLORIDE 0.9% FLUSH: 10.0000 mL | INTRAVENOUS | Status: DC | PRN

## 2016-09-27 MED ORDER — SODIUM CHLORIDE 0.9 % IV SOLN
20.0000 mg | Freq: Once | INTRAVENOUS | Status: AC
  Administered 2016-09-27: 20 mg via INTRAVENOUS
  Filled 2016-09-27: qty 2

## 2016-09-27 MED ORDER — HEPARIN SOD (PORK) LOCK FLUSH 100 UNIT/ML IV SOLN
500.0000 [IU] | Freq: Once | INTRAVENOUS | Status: AC | PRN
  Administered 2016-09-27: 500 [IU]

## 2016-09-27 NOTE — Progress Notes (Signed)
Emily Chroman, MD [place] [address]  Primary cancer of right upper lobe of lung [facility])  CURRENT THERAPY: Concurrent chemoXRT with Carboplatin/Paclitaxel.  INTERVAL HISTORY: Emily Pope 60 y.o. 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    Primary cancer of right upper lobe of lung ([place])   08/04/2016 PET scan    PET IMPRESSION: 1. 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 Mass is confluent with the right superior hilum and right lower tracheal wall, suggestive of a T4 primary tumor. 2. Postobstructive pneumonia in the right upper lobe. 3. Hypermetabolic ipsilateral and contralateral mediastinal and contralateral hilar lymphadenopathy, suggestive of N3 nodal disease.      08/24/2016 Procedure    Bronchoscopy      08/25/2016 Pathology Results    Diagnosis Endobronchial biopsy, RUL - POORLY DIFFERENTIATED NON-SMALL CELL CARCINOMA. The immunophenotype is consistent with poorly differentiated adenocarcinoma.      09/10/2016 Imaging    MRI brain- No metastatic disease or acute intracranial abnormality.      09/20/2016 -  Chemotherapy    The patient had palonosetron (ALOXI) injection 0.25 mg, 0.25 mg, Intravenous,  Once, 2 of 6 cycles  CARBOplatin (PARAPLATIN) 180 mg in sodium chloride 0.9 % 100 mL chemo infusion, 180 mg (99.1 % of original dose 178.2 mg), Intravenous,  Once, 2 of 6 cycles Dose modification:   (original dose 178.2 mg, Cycle 1)  PACLitaxel (TAXOL) 66 mg in dextrose 5 % 250 mL chemo infusion ( for chemotherapy treatment.         Tolerating treatment well.  Weight is stable and actually up.  She denies any nausea or vomiting.  She notes right  shoulder pain, well controlled with current regimen consisting of tramadol.   Review of Systems  Constitutional: Negative.  Negative for chills, fever and weight loss.  HENT: Negative.   Eyes: Negative.  Negative for blurred vision.  Respiratory: Negative.  Negative for cough.   Cardiovascular: Negative.  Negative for chest pain.  Gastrointestinal: Negative.  Negative for constipation, diarrhea, nausea and vomiting.  Genitourinary: Negative.   Musculoskeletal: Positive for joint pain (right shoulder).  Skin: Negative.   Neurological: Negative.  Negative for weakness.  Endo/Heme/Allergies: Negative.   Psychiatric/Behavioral: Negative.     Past Medical History:  Diagnosis Date  . Anxiety    uses it for menopause   . Arthritis   . Cancer ([place])    Right Lung  . Chronic bronchitis ([place])   . COPD (chronic obstructive pulmonary disease) ([place])   . GERD (gastroesophageal reflux disease)   . Headache    sinus  . Hypertension   . Pneumonia    07/22/16-had pneumonia in right lung and found mass in lung  . Primary cancer of right upper lobe of lung ([place]) 08/23/2016    Past Surgical History:  Procedure Laterality Date  . ABDOMINAL HYSTERECTOMY    . APPENDECTOMY     when had hysterectomy  . ENDOBRONCHIAL ULTRASOUND Bilateral 08/23/2016   Procedure: ENDOBRONCHIAL ULTRASOUND;  Surgeon: Collene Gobble, MD;  Location: [facility];  Service: Cardiopulmonary;  Laterality: Bilateral;  . HERNIA REPAIR     left inguinal hernia age 32  . PORTACATH PLACEMENT Left 09/17/2016   Procedure: INSERTION PORT-A-CATH LEFT SUBCLAVIAN;  Surgeon: Aviva Signs,  MD;  Location: [facility];  Service: General;  Laterality: Left;  . TUBAL LIGATION      Family History  Problem Relation Age of Onset  . Cancer Mother     lung  . Hypertension Mother   . Hypertension Father   . Diabetes Father   . Hypertension Sister   . Cancer Brother     lung  . Hypertension Sister     Social History   Social History  .  Marital status: Divorced    Spouse name: N/A  . Number of children: N/A  . Years of education: N/A   Social History Main Topics  . Smoking status: Former Smoker    Packs/day: 2.00    Years: 43.00    Quit date: 07/20/2016  . Smokeless tobacco: Never Used  . Alcohol use No  . Drug use: No  . Sexual activity: Not on file   Other Topics Concern  . Not on file   Social History Narrative  . No narrative on file     PHYSICAL EXAMINATION  ECOG PERFORMANCE STATUS: 1 - Symptomatic but completely ambulatory  There were no vitals filed for this visit.  Vitals - 1 value per visit 31/03/9701  SYSTOLIC 637  DIASTOLIC 75  Pulse 858  Temperature 97.7  Respirations 18    GENERAL:alert, no distress, comfortable, cooperative, smiling and unaccompanied, in chemo-recliner. SKIN: skin color, texture, turgor are normal, no rashes or significant lesions HEAD: Normocephalic, No masses, lesions, tenderness or abnormalities EYES: normal, EOMI, Conjunctiva are pink and non-injected EARS: External ears normal OROPHARYNX:lips, buccal mucosa, and tongue normal and mucous membranes are moist  NECK: supple, trachea midline LYMPH:  no palpable lymphadenopathy BREAST:not examined LUNGS: clear to auscultation and percussion HEART: regular rate & rhythm, no murmurs, no gallops, S1 normal and S2 normal ABDOMEN:abdomen soft and normal bowel sounds BACK: Back symmetric, no curvature. EXTREMITIES:less then 2 second capillary refill, no joint deformities, effusion, or inflammation, no skin discoloration, no cyanosis  NEURO: alert & oriented x 3 with fluent speech, no focal motor/sensory deficits, gait normal   LABORATORY DATA: CBC    Component Value Date/Time   WBC 5.9 09/27/2016 1033   RBC 3.69 (L) 09/27/2016 1033   HGB 11.6 (L) 09/27/2016 1033   HGB 12.9 09/02/2016 1403   HCT 34.3 (L) 09/27/2016 1033   HCT 38.4 09/02/2016 1403   PLT 340 09/27/2016 1033   PLT 476 (H) 09/02/2016 1403   MCV 93.0  09/27/2016 1033   MCV 94.6 09/02/2016 1403   MCH 31.4 09/27/2016 1033   MCHC 33.8 09/27/2016 1033   RDW 12.6 09/27/2016 1033   RDW 13.6 09/02/2016 1403   LYMPHSABS 0.9 09/27/2016 1033   LYMPHSABS 2.5 09/02/2016 1403   MONOABS 0.3 09/27/2016 1033   MONOABS 0.7 09/02/2016 1403   EOSABS 0.5 09/27/2016 1033   EOSABS 0.7 (H) 09/02/2016 1403   BASOSABS 0.1 09/27/2016 1033   BASOSABS 0.1 09/02/2016 1403      Chemistry      Component Value Date/Time   NA 137 09/27/2016 1033   NA 140 09/02/2016 1403   K 3.9 09/27/2016 1033   K 4.2 09/02/2016 1403   CL 106 09/27/2016 1033   CO2 28 09/27/2016 1033   CO2 27 09/02/2016 1403   BUN 14 09/27/2016 1033   BUN 15.6 09/02/2016 1403   CREATININE 0.73 09/27/2016 1033   CREATININE 0.8 09/02/2016 1403      Component Value Date/Time   CALCIUM 8.5 (L) 09/27/2016 1033  CALCIUM 9.6 09/02/2016 1403   ALKPHOS 132 (H) 09/27/2016 1033   ALKPHOS 159 (H) 09/02/2016 1403   AST 16 09/27/2016 1033   AST 12 09/02/2016 1403   ALT 11 (L) 09/27/2016 1033   ALT <9 09/02/2016 1403   BILITOT 0.2 (L) 09/27/2016 1033   BILITOT <0.30 09/02/2016 1403         PENDING LABS:   RADIOGRAPHIC STUDIES:  Mr Jeri Cos EG Contrast  Result Date: 09/10/2016 CLINICAL DATA:  60 year old female diagnosed with right side lung cancer in July. Staging. Subsequent encounter. EXAM: MRI HEAD WITHOUT AND WITH CONTRAST TECHNIQUE: Multiplanar, multiecho pulse sequences of the brain and surrounding structures were obtained without and with intravenous contrast. CONTRAST:  106m MULTIHANCE GADOBENATE DIMEGLUMINE 529 MG/ML IV SOLN [facility]  [facility] FINDINGS: Brain: Mild motion artifact on some postcontrast imaging. No abnormal enhancement identified. No midline shift, mass effect, or evidence of intracranial mass lesion. No dural thickening. No restricted diffusion to suggest acute  infarction. No ventriculomegaly, extra-axial collection or acute intracranial hemorrhage. Cervicomedullary junction and pituitary are within normal limits. Scattered small mostly subcortical cerebral white matter T2 and FLAIR hyperintense foci in a nonspecific configuration. Extent is moderate for age. No cortical encephalomalacia. No chronic cerebral blood products. Small chronic lacunar infarct in the right basal ganglia. Otherwise negative deep gray matter nuclei, brainstem, and cerebellum. Vascular: Major intracranial vascular flow voids are preserved. Mild intracranial artery dolichoectasia. Skull and upper cervical spine: Negative visualized cervical spine. No destructive osseous lesion identified. Sinuses/Orbits: Negative orbits soft tissues. Scattered mild to moderate paranasal sinus mucosal thickening. Other: Visible internal auditory structures appear normal. Mastoids are clear. Negative scalp soft tissues. IMPRESSION: 1.  No metastatic disease or acute intracranial abnormality. 2. Mild to moderate for age nonspecific signal changes in the brain likely due to chronic small vessel disease. 3. Mild to moderate paranasal sinus inflammation. Electronically Signed   By: HGenevie AnnM.D.   On: 09/10/2016 10:18   Dg Chest Port 1 View  Result Date: 09/17/2016 CLINICAL DATA:  Power port placement. EXAM: PORTABLE CHEST 1 VIEW COMPARISON:  CT 09/08/2016.  Chest x-ray 07/21/2016. FINDINGS: Power port catheter noted with lead tip projected over superior vena cava. Heart size normal. Right upper lobe mass with atelectasis again noted. No pleural effusion or pneumothorax. No acute bony abnormality . IMPRESSION: 1. PowerPort catheter noted with tip projected over superior vena cava. No complicating features. 2. Right upper lobe mass with atelectasis again noted. Electronically Signed   By: TMarcello Moores Register   On: 09/17/2016 08:54   Dg C-arm 1-60 Min-no Report  Result Date: 09/17/2016 CLINICAL DATA: port a cath  insertion C-ARM 1-60 MINUTES Fluoroscopy was utilized by the requesting physician.  No radiographic interpretation.     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  Oncology history updated.  Pre-treatment labs: CBC diff, CMET.  I personally reviewed and went over laboratory results with the patient.  The results are noted within this dictation.  Labs satisfy treatment parameters.  Return as scheduled next week for follow-up and next cycle of chemotherapy.    ORDERS PLACED FOR THIS ENCOUNTER: No orders of the defined types were placed in this encounter.   MEDICATIONS PRESCRIBED THIS ENCOUNTER: No orders of the defined types were placed  in this encounter.   THERAPY PLAN:  Continue concurrent chemoradiation with weekly carboplatin/taxol while on Radiation therapy.  This will be followed by 2 cycles of q3weekly carbo-taxol with G-CSF after chemoradiation completed.  All questions were answered. The patient knows to call the clinic with any problems, questions or concerns. We can certainly see the patient much sooner if necessary.  Patient and plan discussed with Dr. Ancil Linsey and she is in agreement with the aforementioned.   This note is electronically signed by: Doy Mince 09/27/2016 5:29 PM

## 2016-09-27 NOTE — Assessment & Plan Note (Addendum)
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  Oncology history updated.  Pre-treatment labs: CBC diff, CMET.  I personally reviewed and went over laboratory results with the patient.  The results are noted within this dictation.  Labs satisfy treatment parameters.  Return as scheduled next week for follow-up and next cycle of chemotherapy.

## 2016-09-27 NOTE — Patient Instructions (Signed)
Emily Pope at [facility] Discharge Instructions  RECOMMENDATIONS MADE BY THE CONSULTANT AND ANY TEST RESULTS WILL BE SENT TO YOUR REFERRING PHYSICIAN.  You were seen today by Kirby Crigler PA-C. Follow up next week as scheduled.   Thank you for choosing [place] at [facility] to provide your oncology and hematology care.  To afford each patient quality time with our provider, please arrive at least 15 minutes before your scheduled appointment time.   Beginning January 23rd 2017 lab work for the [facility] will be done in the  Main lab at [facility] on 1st floor. If you have a lab appointment with the [facility] please come in thru the  Main Entrance and check in at the main information desk  You need to re-schedule your appointment should you arrive 10 or more minutes late.  We strive to give you quality time with our providers, and arriving late affects you and other patients whose appointments are after yours.  Also, if you no show three or more times for appointments you may be dismissed from the clinic at the providers discretion.     Again, thank you for choosing [facility].  Our hope is that these requests will decrease the amount of time that you wait before being seen by our physicians.       _____________________________________________________________  Should you have questions after your visit to [facility], please contact our office at [phone] between the hours of 8:30 a.m. and 4:30 p.m.  Voicemails left after 4:30 p.m. will not be returned until the following business day.  For prescription refill requests, have your pharmacy contact our office.         Resources For Cancer Patients and their Caregivers ? American Cancer Society: Can assist with transportation, wigs, general needs, runs Look Good Feel Better.        630-801-2567 ? Cancer Care: Provides financial assistance, online  support groups, medication/co-pay assistance.  1-800-813-HOPE (801)518-6156) ? [place] Assists [place] cancer patients and their families through emotional , educational and financial support.  872-620-8032 ? [place] DSS Where to apply for food stamps, Medicaid and utility assistance. [phone] ? RC[employer]: Transportation to medical appointments. 858-642-0424 ? Social Security Administration: May apply for disability if have a Stage IV cancer. [phone] 279-561-1810 ? [employer], Disability and Transit Services: Assists with nutrition, care and transit needs. [place] Support Programs: '@10RELATIVEDAYS'$ @ > Cancer Support Group  2nd Tuesday of the month 1pm-2pm, Journey Room  > Creative Journey  3rd Tuesday of the month 1130am-1pm, Journey Room  > Look Good Feel Better  1st Wednesday of the month 10am-12 noon, Journey Room (Call [place] to register [phone])

## 2016-09-27 NOTE — Patient Instructions (Signed)
[facility] Discharge Instructions for Patients Receiving Chemotherapy   Beginning January 23rd 2017 lab work for the [facility] will be done in the  Main lab at [facility] on 1st floor. If you have a lab appointment with the [facility] please come in thru the  Main Entrance and check in at the main information desk   Today you received the following chemotherapy agents: Carboplatin and Taxol.     If you develop nausea and vomiting, or diarrhea that is not controlled by your medication, call the clinic.  The clinic phone number is [phone]. Office hours are Monday-Friday 8:30am-5:00pm.  BELOW ARE SYMPTOMS THAT SHOULD BE REPORTED IMMEDIATELY:  *FEVER GREATER THAN 101.0 F  *CHILLS WITH OR WITHOUT FEVER  NAUSEA AND VOMITING THAT IS NOT CONTROLLED WITH YOUR NAUSEA MEDICATION  *UNUSUAL SHORTNESS OF BREATH  *UNUSUAL BRUISING OR BLEEDING  TENDERNESS IN MOUTH AND THROAT WITH OR WITHOUT PRESENCE OF ULCERS  *URINARY PROBLEMS  *BOWEL PROBLEMS  UNUSUAL RASH Items with * indicate a potential emergency and should be followed up as soon as possible. If you have an emergency after office hours please contact your primary care physician or go to the nearest emergency department.  Please call the clinic during office hours if you have any questions or concerns.   You may also contact the Patient Navigator at [phone] should you have any questions or need assistance in obtaining follow up care.      Resources For Cancer Patients and their Caregivers ? American Cancer Society: Can assist with transportation, wigs, general needs, runs Look Good Feel Better.        321-231-8339 ? Cancer Care: Provides financial assistance, online support groups, medication/co-pay assistance.  1-800-813-HOPE (731)723-1841) ? [place] Assists [place] cancer patients and their families through emotional , educational and financial support.   539-139-5031 ? [place] DSS Where to apply for food stamps, Medicaid and utility assistance. 365-851-1721 ? RCATS: Transportation to medical appointments. 7738062387 ? Social Security Administration: May apply for disability if have a Stage IV cancer. 413-665-5061 [phone] ? [employer], Disability and Transit Services: Assists with nutrition, care and transit needs. [phone]

## 2016-09-27 NOTE — Progress Notes (Signed)
Patient tolerated chemotherapy well.  VSS.  Patient ambulatory and stable upon discharge from clinic.

## 2016-10-01 ENCOUNTER — Telehealth: Payer: Self-pay | Admitting: *Deleted

## 2016-10-01 NOTE — Progress Notes (Signed)
Emily Chroman, MD [place] [id number]  Primary cancer of right upper lobe of lung ([place]) - Plan: HYDROcodone-acetaminophen (HYCET) 7.5-325 mg/15 ml solution, magic mouthwash w/lidocaine SOLN, CBC with Differential, Comprehensive metabolic panel  Cancer related pain - Plan: HYDROcodone-acetaminophen (HYCET) 7.5-325 mg/15 ml solution  CURRENT THERAPY: Concurrent chemoXRT with Carboplatin/Paclitaxel.  INTERVAL HISTORY: Emily Pope 60 y.o. 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    Primary cancer of right upper lobe of lung ([place])   08/04/2016 PET scan    PET IMPRESSION: 1. 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 Mass is confluent with the right superior hilum and right lower tracheal wall, suggestive of a T4 primary tumor. 2. Postobstructive pneumonia in the right upper lobe. 3. Hypermetabolic ipsilateral and contralateral mediastinal and contralateral hilar lymphadenopathy, suggestive of N3 nodal disease.      08/24/2016 Procedure    Bronchoscopy      08/25/2016 Pathology Results    Diagnosis Endobronchial biopsy, RUL - POORLY DIFFERENTIATED NON-SMALL CELL CARCINOMA. The immunophenotype is consistent with poorly differentiated adenocarcinoma.      09/10/2016 Imaging    MRI brain- No metastatic disease or acute intracranial abnormality.      09/20/2016 -  Chemotherapy    The patient had palonosetron (ALOXI) injection 0.25 mg, 0.25 mg, Intravenous,  Once, 2 of 6 cycles  CARBOplatin (PARAPLATIN) 180 mg in sodium chloride 0.9 % 100 mL chemo infusion, 180 mg (99.1 % of original dose 178.2 mg), Intravenous,  Once, 2 of 6 cycles Dose modification:   (original  dose 178.2 mg, Cycle 1)  PACLitaxel (TAXOL) 66 mg in dextrose 5 % 250 mL chemo infusion ( for chemotherapy treatment.         [name] is accompanied by her husband. She is here for Cycle #3 Carboplatin / Paclitaxel.   She reports nausea and fatigue with chemotherapy treatment. She becomes weak, shaky, and tired then she has to lay down.   The radiation treatment "is what's tearing me up". She has been experiencing difficulty swallowing secondary to sensitive throat which started this week. She has tramadol, naprosyn, and something that is supposed to coat her throat but isn't helping (sucralfate). "I know I'm not getting enough nourishment and I'm hardly getting enough fluids". She is concerned about not getting enough nutrition or fluids. She has not met with our nutritionist. She has not tried Boost or Ensure. Notes she was eating fairly well until the last couple of days. She has been eating pudding, oatmeal, and greek yogurt which are easier on her throat.  She has had "some congestion and stuff with radiation" but not terrible.  She reports her teeth feel sore in the sockets. She has an instance of gum bleeding and has been using oral peroxide for that.   Her husband reports she has constipation as well where she has to strain to use the bathroom. When asked during her physical examination, the patient denies bowel issues.   While speaking about future imaging, the patient speaks about transportation concerns and would prefer all imaging to be performed locally.  No fever or chills. No vomiting. Still reports an appetite.   Review of Systems  Constitutional: Positive for malaise/fatigue. Negative for chills, fever and weight loss.  HENT: Positive  for congestion and sore throat.        Difficulty swallowing Teeth pain and gum bleeding  Eyes: Negative.  Negative for blurred vision.  Respiratory: Negative.  Negative for cough.   Cardiovascular: Negative.  Negative for chest pain.   Gastrointestinal: Positive for constipation and nausea. Negative for diarrhea and vomiting.  Genitourinary: Negative.   Musculoskeletal: Positive for joint pain (right shoulder).  Skin: Negative.   Neurological: Positive for weakness.  Endo/Heme/Allergies: Negative.   Psychiatric/Behavioral: Negative.   14 point review of systems was performed and is negative except as detailed under history of present illness and above   Past Medical History:  Diagnosis Date  . Anxiety    uses it for menopause   . Arthritis   . Cancer ([place])    Right Lung  . Chronic bronchitis ([place])   . COPD (chronic obstructive pulmonary disease) ([place])   . GERD (gastroesophageal reflux disease)   . Headache    sinus  . Hypertension   . Pneumonia    07/22/16-had pneumonia in right lung and found mass in lung  . Primary cancer of right upper lobe of lung ([place]) 08/23/2016    Past Surgical History:  Procedure Laterality Date  . ABDOMINAL HYSTERECTOMY    . APPENDECTOMY     when had hysterectomy  . ENDOBRONCHIAL ULTRASOUND Bilateral 08/23/2016   Procedure: ENDOBRONCHIAL ULTRASOUND;  Surgeon: Collene Gobble, MD;  Location: [facility];  Service: Cardiopulmonary;  Laterality: Bilateral;  . HERNIA REPAIR     left inguinal hernia age 27  . PORTACATH PLACEMENT Left 09/17/2016   Procedure: INSERTION PORT-A-CATH LEFT SUBCLAVIAN;  Surgeon: Aviva Signs, MD;  Location: [facility];  Service: General;  Laterality: Left;  . TUBAL LIGATION      Family History  Problem Relation Age of Onset  . Cancer Mother     lung  . Hypertension Mother   . Hypertension Father   . Diabetes Father   . Hypertension Sister   . Cancer Brother     lung  . Hypertension Sister     Social History   Social History  . Marital status: Divorced    Spouse name: N/A  . Number of children: N/A  . Years of education: N/A   Social History Main Topics  . Smoking status: Former Smoker    Packs/day: 2.00    Years: 43.00    Quit date:  07/20/2016  . Smokeless tobacco: Never Used  . Alcohol use No  . Drug use: No  . Sexual activity: Not Asked   Other Topics Concern  . None   Social History Narrative  . None     PHYSICAL EXAMINATION  ECOG PERFORMANCE STATUS: 1 - Symptomatic but completely ambulatory  Vitals:   10/04/16 1000  BP: 107/81  Pulse: 94  Resp: 18  Temp: 98 F (36.7 C)    Vitals with BMI 10/04/2016  Height   Weight 117 lbs 6 oz  BMI   Systolic 778  Diastolic 81  Pulse 94  Respirations 18   Physical Exam  Constitutional: She is oriented to person, place, and time and well-developed, well-nourished, and in no distress.  HENT:  Head: Normocephalic and atraumatic.  Nose: Nose normal.  Mouth/Throat: Oropharynx is clear and moist. No oropharyngeal exudate.  Eyes: Conjunctivae and EOM are normal. Pupils are equal, round, and reactive to light. Right eye exhibits no discharge. Left eye exhibits no discharge. No scleral icterus.  Neck: Normal range of motion. Neck supple. No tracheal  deviation present. No thyromegaly present.  Cardiovascular: Normal rate, regular rhythm and normal heart sounds.  Exam reveals no gallop and no friction rub.   No murmur heard. Pulmonary/Chest: Effort normal and breath sounds normal. She has no wheezes. She has no rales.  Abdominal: Soft. Bowel sounds are normal. She exhibits no distension and no mass. There is no tenderness. There is no rebound and no guarding.  Musculoskeletal: Normal range of motion. She exhibits no edema.  Lymphadenopathy:    She has no cervical adenopathy.  Neurological: She is alert and oriented to person, place, and time. She has normal reflexes. No cranial nerve deficit. Gait normal. Coordination normal.  Skin: Skin is warm and dry. No rash noted.  Psychiatric: Mood, memory, affect and judgment normal.  Nursing note and vitals reviewed.   LABORATORY DATA: I have reviewed the data as listed. CBC    Component Value Date/Time   WBC 5.1  10/04/2016 1058   RBC 3.39 (L) 10/04/2016 1058   HGB 10.8 (L) 10/04/2016 1058   HGB 12.9 09/02/2016 1403   HCT 31.8 (L) 10/04/2016 1058   HCT 38.4 09/02/2016 1403   PLT 323 10/04/2016 1058   PLT 476 (H) 09/02/2016 1403   MCV 93.8 10/04/2016 1058   MCV 94.6 09/02/2016 1403   MCH 31.9 10/04/2016 1058   MCHC 34.0 10/04/2016 1058   RDW 12.4 10/04/2016 1058   RDW 13.6 09/02/2016 1403   LYMPHSABS 0.7 10/04/2016 1058   LYMPHSABS 2.5 09/02/2016 1403   MONOABS 0.5 10/04/2016 1058   MONOABS 0.7 09/02/2016 1403   EOSABS 0.3 10/04/2016 1058   EOSABS 0.7 (H) 09/02/2016 1403   BASOSABS 0.0 10/04/2016 1058   BASOSABS 0.1 09/02/2016 1403      Chemistry      Component Value Date/Time   NA 134 (L) 10/04/2016 1058   NA 140 09/02/2016 1403   K 4.1 10/04/2016 1058   K 4.2 09/02/2016 1403   CL 100 (L) 10/04/2016 1058   CO2 28 10/04/2016 1058   CO2 27 09/02/2016 1403   BUN 15 10/04/2016 1058   BUN 15.6 09/02/2016 1403   CREATININE 0.80 10/04/2016 1058   CREATININE 0.8 09/02/2016 1403      Component Value Date/Time   CALCIUM 9.2 10/04/2016 1058   CALCIUM 9.6 09/02/2016 1403   ALKPHOS 121 10/04/2016 1058   ALKPHOS 159 (H) 09/02/2016 1403   AST 14 (L) 10/04/2016 1058   AST 12 09/02/2016 1403   ALT 12 (L) 10/04/2016 1058   ALT <9 09/02/2016 1403   BILITOT 0.3 10/04/2016 1058   BILITOT <0.30 09/02/2016 1403        PENDING LABS:   RADIOGRAPHIC STUDIES:  Mr Jeri Cos LK Contrast  Result Date: 09/10/2016 CLINICAL DATA:  60 year old female diagnosed with right side lung cancer in July. Staging. Subsequent encounter. EXAM: MRI HEAD WITHOUT AND WITH CONTRAST TECHNIQUE: Multiplanar, multiecho pulse sequences of the brain and surrounding structures were obtained without and with intravenous contrast. CONTRAST:  70m MULTIHANCE GADOBENATE DIMEGLUMINE 529 MG/ML IV SOLN [facility]  [facility].  FINDINGS: Brain: Mild motion artifact on some postcontrast imaging. No abnormal enhancement identified. No midline shift, mass effect, or evidence of intracranial mass lesion. No dural thickening. No restricted diffusion to suggest acute infarction. No ventriculomegaly, extra-axial collection or acute intracranial hemorrhage. Cervicomedullary junction and pituitary are within normal limits. Scattered small mostly subcortical cerebral white matter T2 and FLAIR hyperintense foci in a  nonspecific configuration. Extent is moderate for age. No cortical encephalomalacia. No chronic cerebral blood products. Small chronic lacunar infarct in the right basal ganglia. Otherwise negative deep gray matter nuclei, brainstem, and cerebellum. Vascular: Major intracranial vascular flow voids are preserved. Mild intracranial artery dolichoectasia. Skull and upper cervical spine: Negative visualized cervical spine. No destructive osseous lesion identified. Sinuses/Orbits: Negative orbits soft tissues. Scattered mild to moderate paranasal sinus mucosal thickening. Other: Visible internal auditory structures appear normal. Mastoids are clear. Negative scalp soft tissues. IMPRESSION: 1.  No metastatic disease or acute intracranial abnormality. 2. Mild to moderate for age nonspecific signal changes in the brain likely due to chronic small vessel disease. 3. Mild to moderate paranasal sinus inflammation. Electronically Signed   By: Genevie Ann M.D.   On: 09/10/2016 10:18   Dg Chest Port 1 View  Result Date: 09/17/2016 CLINICAL DATA:  Power port placement. EXAM: PORTABLE CHEST 1 VIEW COMPARISON:  CT 09/08/2016.  Chest x-ray 07/21/2016. FINDINGS: Power port catheter noted with lead tip projected over superior vena cava. Heart size normal. Right upper lobe mass with atelectasis again noted. No pleural effusion or pneumothorax. No acute bony abnormality . IMPRESSION: 1. PowerPort catheter noted with tip projected over superior vena cava. No  complicating features. 2. Right upper lobe mass with atelectasis again noted. Electronically Signed   By: Marcello Moores  Register   On: 09/17/2016 08:54   Dg C-arm 1-60 Min-no Report  Result Date: 09/17/2016 CLINICAL DATA: port a cath insertion C-ARM 1-60 MINUTES Fluoroscopy was utilized by the requesting physician.  No radiographic interpretation.    PATHOLOGY:    ASSESSMENT AND PLAN:  Stage IIIB NSCLC, poorly differentiated, favors squamous cell Concurrent ChemoXRT Radiation induced Esophagitis Anxiety Cancer related pain Chemotherapy induced anemia constipation  She is here for Cycle #3 Carboplatin / Paclitaxel. Labs are reviewed with the patient and her husband, they are noted above.   She is receiving concurrent XRT, has begun to develop esophagitis. We discussed this today. I have written her a prescription for Magic Mouthwash and liquid Lortab to help with difficulty swallowing and pain management. She is concerned about getting adequate nutrition and fluids with her pain. She will meet with our dietician Ovid Curd. She will be given Boost and Ensure samples.  She complains of nausea, fatigue, constipation, and difficulty swallowing.  She will be education of constipation management and received a printed constipation care sheet. She was instructed again on how to use her nausea medications. We may need to arrange for hydration in the near future.   While speaking about future imaging, the patient speaks about transportation concerns and would prefer all imaging to be performed locally.   She is scheduled for follow up on 10/11/2016.  ORDERS PLACED FOR THIS ENCOUNTER: Orders Placed This Encounter  Procedures  . CBC with Differential  . Comprehensive metabolic panel    MEDICATIONS PRESCRIBED THIS ENCOUNTER: Meds ordered this encounter  Medications  . HYDROcodone-acetaminophen (HYCET) 7.5-325 mg/15 ml solution    Sig: Take 10 mLs by mouth every 4 (four) hours as needed for  moderate pain.    Dispense:  240 mL    Refill:  0  . magic mouthwash w/lidocaine SOLN    Sig: 1 part of each: Benadryl 12.68m/5ml, Viscous lidocaine 2% and Maalox. Swish and swallow 5 ml QID    Dispense:  360 mL    Refill:  1    THERAPY PLAN:  Continue concurrent chemoradiation with weekly carboplatin/taxol while on Radiation therapy.  This will  be followed by 2 cycles of q3weekly carbo-taxol with G-CSF after chemoradiation completed.  We will discuss further at future follow-up.   All questions were answered. The patient knows to call the clinic with any problems, questions or concerns. We can certainly see the patient much sooner if necessary.  This document serves as a record of services personally performed by Ancil Linsey, MD. It was created on her behalf by Arlyce Harman, a trained medical scribe. The creation of this record is based on the scribe's personal observations and the provider's statements to them. This document has been checked and approved by the attending provider.  I have reviewed the above documentation for accuracy and completeness and I agree with the above.  This note is electronically signed by: Molli Hazard, MD 10/09/2016 6:07 PM

## 2016-10-01 NOTE — Telephone Encounter (Signed)
Oncology Nurse Navigator Documentation  Oncology Nurse Navigator Flowsheets 10/01/2016  Navigator Location [facility]  Navigator Encounter Type Treatment/I called to check on Ms. Pressey.  I was able to speak with her. I listened as she explained how she was doing with treatment.  No barriers identified just offered support.    Treatment Phase Treatment  Barriers/Navigation Needs (No Data)  Acuity Level 1  Acuity Level 1 Minimal follow up required  Time Spent with Patient 15

## 2016-10-04 ENCOUNTER — Encounter (HOSPITAL_BASED_OUTPATIENT_CLINIC_OR_DEPARTMENT_OTHER): Payer: Medicaid Other

## 2016-10-04 ENCOUNTER — Encounter (HOSPITAL_BASED_OUTPATIENT_CLINIC_OR_DEPARTMENT_OTHER): Payer: Medicaid Other | Admitting: Hematology & Oncology

## 2016-10-04 ENCOUNTER — Encounter: Payer: Self-pay | Admitting: *Deleted

## 2016-10-04 ENCOUNTER — Encounter (HOSPITAL_COMMUNITY): Payer: Self-pay | Admitting: Hematology & Oncology

## 2016-10-04 VITALS — BP 107/81 | HR 94 | Temp 98.0°F | Resp 18 | Wt 117.4 lb

## 2016-10-04 VITALS — BP 112/62 | HR 93 | Temp 97.6°F | Resp 18

## 2016-10-04 DIAGNOSIS — T66XXXA Radiation sickness, unspecified, initial encounter: Secondary | ICD-10-CM

## 2016-10-04 DIAGNOSIS — C3411 Malignant neoplasm of upper lobe, right bronchus or lung: Secondary | ICD-10-CM

## 2016-10-04 DIAGNOSIS — K208 Other esophagitis without bleeding: Secondary | ICD-10-CM

## 2016-10-04 DIAGNOSIS — G893 Neoplasm related pain (acute) (chronic): Secondary | ICD-10-CM | POA: Diagnosis not present

## 2016-10-04 DIAGNOSIS — Z5111 Encounter for antineoplastic chemotherapy: Secondary | ICD-10-CM | POA: Diagnosis present

## 2016-10-04 DIAGNOSIS — F419 Anxiety disorder, unspecified: Secondary | ICD-10-CM

## 2016-10-04 DIAGNOSIS — K5903 Drug induced constipation: Secondary | ICD-10-CM | POA: Diagnosis not present

## 2016-10-04 LAB — COMPREHENSIVE METABOLIC PANEL
ALBUMIN: 3.9 g/dL (ref 3.5–5.0)
ALK PHOS: 121 U/L (ref 38–126)
ALT: 12 U/L — AB (ref 14–54)
AST: 14 U/L — AB (ref 15–41)
Anion gap: 6 (ref 5–15)
BILIRUBIN TOTAL: 0.3 mg/dL (ref 0.3–1.2)
BUN: 15 mg/dL (ref 6–20)
CALCIUM: 9.2 mg/dL (ref 8.9–10.3)
CO2: 28 mmol/L (ref 22–32)
Chloride: 100 mmol/L — ABNORMAL LOW (ref 101–111)
Creatinine, Ser: 0.8 mg/dL (ref 0.44–1.00)
GFR calc Af Amer: >60 mL/min (ref >60)
GFR calc non Af Amer: >60 mL/min (ref >60)
GLUCOSE: 90 mg/dL (ref 65–99)
Potassium: 4.1 mmol/L (ref 3.5–5.1)
Sodium: 134 mmol/L — ABNORMAL LOW (ref 135–145)
TOTAL PROTEIN: 7.3 g/dL (ref 6.5–8.1)

## 2016-10-04 LAB — CBC WITH DIFFERENTIAL/PLATELET
BASOS ABS: 0 10*3/uL (ref 0.0–0.1)
BASOS PCT: 1 %
Eosinophils Absolute: 0.3 10*3/uL (ref 0.0–0.7)
Eosinophils Relative: 6 %
HEMATOCRIT: 31.8 % — AB (ref 36.0–46.0)
HEMOGLOBIN: 10.8 g/dL — AB (ref 12.0–15.0)
Lymphocytes Relative: 14 %
Lymphs Abs: 0.7 10*3/uL (ref 0.7–4.0)
MCH: 31.9 pg (ref 26.0–34.0)
MCHC: 34 g/dL (ref 30.0–36.0)
MCV: 93.8 fL (ref 78.0–100.0)
MONOS PCT: 10 %
Monocytes Absolute: 0.5 10*3/uL (ref 0.1–1.0)
NEUTROS ABS: 3.5 10*3/uL (ref 1.7–7.7)
NEUTROS PCT: 69 %
Platelets: 323 10*3/uL (ref 150–400)
RBC: 3.39 MIL/uL — AB (ref 3.87–5.11)
RDW: 12.4 % (ref 11.5–15.5)
WBC: 5.1 10*3/uL (ref 4.0–10.5)

## 2016-10-04 MED ORDER — FAMOTIDINE IN NACL 20-0.9 MG/50ML-% IV SOLN
20.0000 mg | Freq: Once | INTRAVENOUS | Status: AC
  Administered 2016-10-04: 20 mg via INTRAVENOUS

## 2016-10-04 MED ORDER — HYDROCODONE-ACETAMINOPHEN 7.5-325 MG/15ML PO SOLN: 10.0000 mL | ORAL | 0 refills | Status: DC | PRN

## 2016-10-04 MED ORDER — SODIUM CHLORIDE 0.9 % IV SOLN
178.2000 mg | Freq: Once | INTRAVENOUS | Status: AC
  Administered 2016-10-04: 180 mg via INTRAVENOUS
  Filled 2016-10-04: qty 18

## 2016-10-04 MED ORDER — PALONOSETRON HCL INJECTION 0.25 MG/5ML
0.2500 mg | Freq: Once | INTRAVENOUS | Status: AC
  Administered 2016-10-04: 0.25 mg via INTRAVENOUS

## 2016-10-04 MED ORDER — SODIUM CHLORIDE 0.9% FLUSH: 10.0000 mL | INTRAVENOUS | Status: DC | PRN

## 2016-10-04 MED ORDER — DIPHENHYDRAMINE HCL 50 MG/ML IJ SOLN
50.0000 mg | Freq: Once | INTRAMUSCULAR | Status: AC
  Administered 2016-10-04: 50 mg via INTRAVENOUS

## 2016-10-04 MED ORDER — SODIUM CHLORIDE 0.9 % IV SOLN
20.0000 mg | Freq: Once | INTRAVENOUS | Status: AC
  Administered 2016-10-04: 20 mg via INTRAVENOUS
  Filled 2016-10-04: qty 2

## 2016-10-04 MED ORDER — FAMOTIDINE IN NACL 20-0.9 MG/50ML-% IV SOLN
INTRAVENOUS | Status: AC
  Filled 2016-10-04: qty 50

## 2016-10-04 MED ORDER — PALONOSETRON HCL INJECTION 0.25 MG/5ML
INTRAVENOUS | Status: AC
  Filled 2016-10-04: qty 5

## 2016-10-04 MED ORDER — PACLITAXEL CHEMO INJECTION 300 MG/50ML
45.0000 mg/m2 | Freq: Once | INTRAVENOUS | Status: AC
  Administered 2016-10-04: 66 mg via INTRAVENOUS
  Filled 2016-10-04: qty 11

## 2016-10-04 MED ORDER — DIPHENHYDRAMINE HCL 50 MG/ML IJ SOLN
INTRAMUSCULAR | Status: AC
  Filled 2016-10-04: qty 1

## 2016-10-04 MED ORDER — HEPARIN SOD (PORK) LOCK FLUSH 100 UNIT/ML IV SOLN
500.0000 [IU] | Freq: Once | INTRAVENOUS | Status: AC | PRN
  Administered 2016-10-04: 500 [IU]

## 2016-10-04 MED ORDER — MAGIC MOUTHWASH W/LIDOCAINE: ORAL | 1 refills | Status: DC

## 2016-10-04 MED ORDER — SODIUM CHLORIDE 0.9 % IV SOLN
Freq: Once | INTRAVENOUS | Status: AC
  Administered 2016-10-04: 12:00:00 via INTRAVENOUS

## 2016-10-04 NOTE — Progress Notes (Signed)
Oncology Nurse Navigator Documentation  Oncology Nurse Navigator Flowsheets 10/04/2016  Navigator Location [facility]  Navigator Encounter Type Treatment/I received notification from foundation one that testing could not be completed due to insufficient material.  I will update [facility] navigator  Treatment Phase Treatment  Barriers/Navigation Needs Coordination of Care  Education Other  Interventions Coordination of Care  Coordination of Care Other  Acuity Level 2  Acuity Level 2 Other  Time Spent with Patient 30

## 2016-10-04 NOTE — Patient Instructions (Signed)
[place] at [facility] Discharge Instructions  RECOMMENDATIONS MADE BY THE CONSULTANT AND ANY TEST RESULTS WILL BE SENT TO YOUR REFERRING PHYSICIAN.  You saw Dr. Whitney Muse today. Follow up in 1 week with Dr. Whitney Muse with chemo and lab work. Ovid Curd will call you or see you next week at appointment.  Thank you for choosing [place] at [facility] to provide your oncology and hematology care.  To afford each patient quality time with our provider, please arrive at least 15 minutes before your scheduled appointment time.   Beginning January 23rd 2017 lab work for the [facility] will be done in the  Main lab at [facility] on 1st floor. If you have a lab appointment with the [facility] please come in thru the  Main Entrance and check in at the main information desk  You need to re-schedule your appointment should you arrive 10 or more minutes late.  We strive to give you quality time with our providers, and arriving late affects you and other patients whose appointments are after yours.  Also, if you no show three or more times for appointments you may be dismissed from the clinic at the providers discretion.     Again, thank you for choosing [facility].  Our hope is that these requests will decrease the amount of time that you wait before being seen by our physicians.       _____________________________________________________________  Should you have questions after your visit to [facility], please contact our office at [phone] between the hours of 8:30 a.m. and 4:30 p.m.  Voicemails left after 4:30 p.m. will not be returned until the following business day.  For prescription refill requests, have your pharmacy contact our office.         Resources For Cancer Patients and their Caregivers ? American Cancer Society: Can assist with transportation, wigs, general needs, runs Look Good Feel Better.         9257251342 ? Cancer Care: Provides financial assistance, online support groups, medication/co-pay assistance.  1-800-813-HOPE (613)878-3669) ? [place] Assists [place] cancer patients and their families through emotional , educational and financial support.  5023355498 ? [place] DSS Where to apply for food stamps, Medicaid and utility assistance. (413) 609-8075 ? [facility]: Transportation to medical appointments. 206-689-5588 ? Social Security Administration: May apply for disability if have a Stage IV cancer. (534)553-0518 732-213-2298 ? [employer], Disability and Transit Services: Assists with nutrition, care and transit needs. [place] Support Programs: '@10RELATIVEDAYS'$ @ > Cancer Support Group  2nd Tuesday of the month 1pm-2pm, Journey Room  > Creative Journey  3rd Tuesday of the month 1130am-1pm, Journey Room  > Look Good Feel Better  1st Wednesday of the month 10am-12 noon, Journey Room (Call [place] to register [phone])   Constipation, Adult Constipation is when a person has fewer than three bowel movements a week, has difficulty having a bowel movement, or has stools that are dry, hard, or larger than normal. As people grow older, constipation is more common. A low-fiber diet, not taking in enough fluids, and taking certain medicines may make constipation worse.  CAUSES   Certain medicines, such as antidepressants, pain medicine, iron supplements, antacids, and water pills.   Certain diseases, such as diabetes, irritable bowel syndrome (IBS), thyroid disease, or depression.   Not drinking enough water.   Not eating enough fiber-rich foods.  Stress or travel.   Lack of physical activity or exercise.   Ignoring the urge to have a bowel movement.   Using laxatives too much.  SIGNS AND SYMPTOMS   Having fewer than three bowel movements a week.   Straining to have a bowel  movement.   Having stools that are hard, dry, or larger than normal.   Feeling full or bloated.   Pain in the lower abdomen.   Not feeling relief after having a bowel movement.  DIAGNOSIS  Your health care provider will take a medical history and perform a physical exam. Further testing may be done for severe constipation. Some tests may include:  A barium enema X-ray to examine your rectum, colon, and, sometimes, your small intestine.   A sigmoidoscopy to examine your lower colon.   A colonoscopy to examine your entire colon. TREATMENT  Treatment will depend on the severity of your constipation and what is causing it. Some dietary treatments include drinking more fluids and eating more fiber-rich foods. Lifestyle treatments may include regular exercise. If these diet and lifestyle recommendations do not help, your health care provider may recommend taking over-the-counter laxative medicines to help you have bowel movements. Prescription medicines may be prescribed if over-the-counter medicines do not work.  HOME CARE INSTRUCTIONS   Eat foods that have a lot of fiber, such as fruits, vegetables, whole grains, and beans.  Limit foods high in fat and processed sugars, such as french fries, hamburgers, cookies, candies, and soda.   A fiber supplement may be added to your diet if you cannot get enough fiber from foods.   Drink enough fluids to keep your urine clear or pale yellow.   Exercise regularly or as directed by your health care provider.   Go to the restroom when you have the urge to go. Do not hold it.   Only take over-the-counter or prescription medicines as directed by your health care provider. Do not take other medicines for constipation without talking to your health care provider first.  [place] IF:   You have bright red blood in your stool.   Your constipation lasts for more than 4 days or gets worse.   You have abdominal or  rectal pain.   You have thin, pencil-like stools.   You have unexplained weight loss. MAKE SURE YOU:   Understand these instructions.  Will watch your condition.  Will get help right away if you are not doing well or get worse.   This information is not intended to replace advice given to you by your health care provider. Make sure you discuss any questions you have with your health care provider.   Document Released: 09/10/2004 Document Revised: 01/03/2015 Document Reviewed: 09/24/2013 Elsevier Interactive Patient Education [employer].

## 2016-10-04 NOTE — Patient Instructions (Signed)
[facility] Discharge Instructions for Patients Receiving Chemotherapy   Beginning January 23rd 2017 lab work for the Cancer Center will be done in the  Main lab at [facility] on 1st floor. If you have a lab appointment with the Cancer Center please come in thru the  Main Entrance and check in at the main information desk   Today you received the following chemotherapy agents Carbo/Taxol  To help prevent nausea and vomiting after your treatment, we encourage you to take your nausea medication   If you develop nausea and vomiting, or diarrhea that is not controlled by your medication, call the clinic.  The clinic phone number is [phone]. Office hours are Monday-Friday 8:30am-5:00pm.  BELOW ARE SYMPTOMS THAT SHOULD BE REPORTED IMMEDIATELY:  *FEVER GREATER THAN 101.0 F  *CHILLS WITH OR WITHOUT FEVER  NAUSEA AND VOMITING THAT IS NOT CONTROLLED WITH YOUR NAUSEA MEDICATION  *UNUSUAL SHORTNESS OF BREATH  *UNUSUAL BRUISING OR BLEEDING  TENDERNESS IN MOUTH AND THROAT WITH OR WITHOUT PRESENCE OF ULCERS  *URINARY PROBLEMS  *BOWEL PROBLEMS  UNUSUAL RASH Items with * indicate a potential emergency and should be followed up as soon as possible. If you have an emergency after office hours please contact your primary care physician or go to the nearest emergency department.  Please call the clinic during office hours if you have any questions or concerns.   You may also contact the Patient Navigator at [phone] should you have any questions or need assistance in obtaining follow up care.      Resources For Cancer Patients and their Caregivers ? American Cancer Society: Can assist with transportation, wigs, general needs, runs Look Good Feel Better.        1-888-227-6333 ? Cancer Care: Provides financial assistance, online support groups, medication/co-pay assistance.  1-800-813-HOPE (4673) ? [facility] Assists [place]  cancer patients and their families through emotional , educational and financial support.  336-427-4357 ? [place] DSS Where to apply for food stamps, Medicaid and utility assistance. 336-342-1394 ? [facility]: Transportation to medical appointments. 336-347-2287 ? Social Security Administration: May apply for disability if have a Stage IV cancer. 336-342-7796 1-800-772-1213 ? [place] Aging, Disability and Transit Services: Assists with nutrition, care and transit needs. 336-349-2343          

## 2016-10-04 NOTE — Progress Notes (Signed)
Chemotherapy given today per orders. Patient tolerated it well, no problems. Vitals stable and discharged home ambulatory with husband at side.   Follow up as scheduled.

## 2016-10-09 ENCOUNTER — Encounter (HOSPITAL_COMMUNITY): Payer: Self-pay | Admitting: Hematology & Oncology

## 2016-10-11 ENCOUNTER — Encounter (HOSPITAL_BASED_OUTPATIENT_CLINIC_OR_DEPARTMENT_OTHER): Payer: Medicaid Other

## 2016-10-11 ENCOUNTER — Encounter (HOSPITAL_BASED_OUTPATIENT_CLINIC_OR_DEPARTMENT_OTHER): Payer: Medicaid Other | Admitting: Hematology & Oncology

## 2016-10-11 ENCOUNTER — Encounter (HOSPITAL_COMMUNITY): Payer: Self-pay | Admitting: Hematology & Oncology

## 2016-10-11 VITALS — BP 108/54 | HR 90 | Temp 98.0°F | Resp 16

## 2016-10-11 VITALS — BP 115/74 | HR 92 | Temp 98.1°F | Resp 16 | Wt 119.4 lb

## 2016-10-11 DIAGNOSIS — K219 Gastro-esophageal reflux disease without esophagitis: Secondary | ICD-10-CM | POA: Diagnosis not present

## 2016-10-11 DIAGNOSIS — T66XXXA Radiation sickness, unspecified, initial encounter: Secondary | ICD-10-CM

## 2016-10-11 DIAGNOSIS — Z0000 Encounter for general adult medical examination without abnormal findings: Secondary | ICD-10-CM

## 2016-10-11 DIAGNOSIS — C3411 Malignant neoplasm of upper lobe, right bronchus or lung: Secondary | ICD-10-CM

## 2016-10-11 DIAGNOSIS — G893 Neoplasm related pain (acute) (chronic): Secondary | ICD-10-CM

## 2016-10-11 DIAGNOSIS — K208 Other esophagitis without bleeding: Secondary | ICD-10-CM | POA: Insufficient documentation

## 2016-10-11 DIAGNOSIS — Z5111 Encounter for antineoplastic chemotherapy: Secondary | ICD-10-CM | POA: Diagnosis not present

## 2016-10-11 DIAGNOSIS — Z23 Encounter for immunization: Secondary | ICD-10-CM | POA: Diagnosis not present

## 2016-10-11 LAB — COMPREHENSIVE METABOLIC PANEL
ALBUMIN: 3.7 g/dL (ref 3.5–5.0)
ALT: 13 U/L — AB (ref 14–54)
AST: 16 U/L (ref 15–41)
Alkaline Phosphatase: 112 U/L (ref 38–126)
Anion gap: 7 (ref 5–15)
BILIRUBIN TOTAL: 0.3 mg/dL (ref 0.3–1.2)
BUN: 18 mg/dL (ref 6–20)
CO2: 27 mmol/L (ref 22–32)
CREATININE: 0.73 mg/dL (ref 0.44–1.00)
Calcium: 8.9 mg/dL (ref 8.9–10.3)
Chloride: 100 mmol/L — ABNORMAL LOW (ref 101–111)
GFR calc Af Amer: >60 mL/min (ref >60)
GLUCOSE: 103 mg/dL — AB (ref 65–99)
POTASSIUM: 4.1 mmol/L (ref 3.5–5.1)
Sodium: 134 mmol/L — ABNORMAL LOW (ref 135–145)
TOTAL PROTEIN: 6.9 g/dL (ref 6.5–8.1)

## 2016-10-11 LAB — CBC WITH DIFFERENTIAL/PLATELET
BASOS ABS: 0 10*3/uL (ref 0.0–0.1)
BASOS PCT: 1 %
Eosinophils Absolute: 0.2 10*3/uL (ref 0.0–0.7)
Eosinophils Relative: 4 %
HEMATOCRIT: 31.8 % — AB (ref 36.0–46.0)
HEMOGLOBIN: 10.7 g/dL — AB (ref 12.0–15.0)
LYMPHS PCT: 8 %
Lymphs Abs: 0.4 10*3/uL — ABNORMAL LOW (ref 0.7–4.0)
MCH: 31.3 pg (ref 26.0–34.0)
MCHC: 33.6 g/dL (ref 30.0–36.0)
MCV: 93 fL (ref 78.0–100.0)
MONO ABS: 0.6 10*3/uL (ref 0.1–1.0)
Monocytes Relative: 13 %
NEUTROS ABS: 3.5 10*3/uL (ref 1.7–7.7)
NEUTROS PCT: 74 %
Platelets: 277 10*3/uL (ref 150–400)
RBC: 3.42 MIL/uL — AB (ref 3.87–5.11)
RDW: 13.2 % (ref 11.5–15.5)
WBC: 4.7 10*3/uL (ref 4.0–10.5)

## 2016-10-11 MED ORDER — SODIUM CHLORIDE 0.9 % IV SOLN
20.0000 mg | Freq: Once | INTRAVENOUS | Status: AC
  Administered 2016-10-11: 20 mg via INTRAVENOUS
  Filled 2016-10-11: qty 2

## 2016-10-11 MED ORDER — DIPHENHYDRAMINE HCL 50 MG/ML IJ SOLN
INTRAMUSCULAR | Status: AC
  Filled 2016-10-11: qty 1

## 2016-10-11 MED ORDER — DIPHENHYDRAMINE HCL 50 MG/ML IJ SOLN
50.0000 mg | Freq: Once | INTRAMUSCULAR | Status: AC
  Administered 2016-10-11: 50 mg via INTRAVENOUS

## 2016-10-11 MED ORDER — HEPARIN SOD (PORK) LOCK FLUSH 100 UNIT/ML IV SOLN
500.0000 [IU] | Freq: Once | INTRAVENOUS | Status: AC | PRN
  Administered 2016-10-11: 500 [IU]
  Filled 2016-10-11: qty 5

## 2016-10-11 MED ORDER — INFLUENZA VAC SPLIT QUAD 0.5 ML IM SUSY
0.5000 mL | PREFILLED_SYRINGE | Freq: Once | INTRAMUSCULAR | Status: AC
  Administered 2016-10-11: 0.5 mL via INTRAMUSCULAR

## 2016-10-11 MED ORDER — PALONOSETRON HCL INJECTION 0.25 MG/5ML
0.2500 mg | Freq: Once | INTRAVENOUS | Status: AC
  Administered 2016-10-11: 0.25 mg via INTRAVENOUS

## 2016-10-11 MED ORDER — SODIUM CHLORIDE 0.9% FLUSH: 10.0000 mL | INTRAVENOUS | Status: DC | PRN

## 2016-10-11 MED ORDER — PACLITAXEL CHEMO INJECTION 300 MG/50ML
45.0000 mg/m2 | Freq: Once | INTRAVENOUS | Status: AC
  Administered 2016-10-11: 66 mg via INTRAVENOUS
  Filled 2016-10-11: qty 11

## 2016-10-11 MED ORDER — FAMOTIDINE IN NACL 20-0.9 MG/50ML-% IV SOLN
INTRAVENOUS | Status: AC
  Filled 2016-10-11: qty 50

## 2016-10-11 MED ORDER — PALONOSETRON HCL INJECTION 0.25 MG/5ML
INTRAVENOUS | Status: AC
  Filled 2016-10-11: qty 5

## 2016-10-11 MED ORDER — SODIUM CHLORIDE 0.9 % IV SOLN
178.2000 mg | Freq: Once | INTRAVENOUS | Status: AC
  Administered 2016-10-11: 180 mg via INTRAVENOUS
  Filled 2016-10-11: qty 18

## 2016-10-11 MED ORDER — INFLUENZA VAC SPLIT QUAD 0.5 ML IM SUSY
PREFILLED_SYRINGE | INTRAMUSCULAR | Status: AC
  Filled 2016-10-11: qty 0.5

## 2016-10-11 MED ORDER — SODIUM CHLORIDE 0.9 % IV SOLN
Freq: Once | INTRAVENOUS | Status: AC
  Administered 2016-10-11: 10:00:00 via INTRAVENOUS

## 2016-10-11 MED ORDER — DEXAMETHASONE SODIUM PHOSPHATE 10 MG/ML IJ SOLN
INTRAMUSCULAR | Status: AC
  Filled 2016-10-11: qty 2

## 2016-10-11 MED ORDER — FAMOTIDINE IN NACL 20-0.9 MG/50ML-% IV SOLN
20.0000 mg | Freq: Once | INTRAVENOUS | Status: AC
  Administered 2016-10-11: 20 mg via INTRAVENOUS

## 2016-10-11 NOTE — Assessment & Plan Note (Addendum)
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   Pre-treatment labs: CBC diff, CMET.  I personally reviewed and went over laboratory results with the patient.  The results are noted within this dictation.  Labs satisfy treatment parameters.  Return as scheduled next week for follow-up and next cycle of chemotherapy. We again address 2 additional cycles of carbo/taxol at the completion of concurrent therapy. She is very open to this.   She does not need refills today.

## 2016-10-11 NOTE — Assessment & Plan Note (Signed)
Currently using tagamet OTC with good control.

## 2016-10-11 NOTE — Progress Notes (Signed)
Carlyne Keehan presents today for injection per MD orders. Flu Vaccine administered SQ in left Upper Arm. Administration without incident. Patient tolerated well.

## 2016-10-11 NOTE — Patient Instructions (Addendum)
[place] at [facility] Discharge Instructions  RECOMMENDATIONS MADE BY THE CONSULTANT AND ANY TEST RESULTS WILL BE SENT TO YOUR REFERRING PHYSICIAN.  You saw Dr. Whitney Muse today. You had Flu shot today. Follow up in 1 week with Tom with labs and chemo.  Thank you for choosing [place] at [facility] to provide your oncology and hematology care.  To afford each patient quality time with our provider, please arrive at least 15 minutes before your scheduled appointment time.   Beginning January 23rd 2017 lab work for the [facility] will be done in the  Main lab at [facility] on 1st floor. If you have a lab appointment with the [facility] please come in thru the  Main Entrance and check in at the main information desk  You need to re-schedule your appointment should you arrive 10 or more minutes late.  We strive to give you quality time with our providers, and arriving late affects you and other patients whose appointments are after yours.  Also, if you no show three or more times for appointments you may be dismissed from the clinic at the providers discretion.     Again, thank you for choosing [facility].  Our hope is that these requests will decrease the amount of time that you wait before being seen by our physicians.       _____________________________________________________________  Should you have questions after your visit to [facility], please contact our office at [phone] between the hours of 8:30 a.m. and 4:30 p.m.  Voicemails left after 4:30 p.m. will not be returned until the following business day.  For prescription refill requests, have your pharmacy contact our office.         Resources For Cancer Patients and their Caregivers ? American Cancer Society: Can assist with transportation, wigs, general needs, runs Look Good Feel Better.        930 001 7736 ? Cancer Care: Provides  financial assistance, online support groups, medication/co-pay assistance.  1-800-813-HOPE 228-128-0502) ? [facility] Assists [place] cancer patients and their families through emotional , educational and financial support.  617-220-4646 ? [place] DSS Where to apply for food stamps, Medicaid and utility assistance. 212-786-6901 ? RCATS: Transportation to medical appointments. (939) 307-9474 ? Social Security Administration: May apply for disability if have a Stage IV cancer. 7138857573 610-350-0573 ? [employer], Disability and Transit Services: Assists with nutrition, care and transit needs. [place] Support Programs: '@10RELATIVEDAYS'$ @ > Cancer Support Group  2nd Tuesday of the month 1pm-2pm, Journey Room  > Creative Journey  3rd Tuesday of the month 1130am-1pm, Journey Room  > Look Good Feel Better  1st Wednesday of the month 10am-12 noon, Journey Room (Call American Cancer Society to register [phone])   Influenza Virus Vaccine injection (Fluarix) What is this medicine? INFLUENZA VIRUS VACCINE (in floo EN zuh VAHY ruhs vak SEEN) helps to reduce the risk of getting influenza also known as the flu. This medicine may be used for other purposes; ask your health care provider or pharmacist if you have questions. What should I tell my health care provider before I take this medicine? They need to know if you have any of these conditions: -bleeding disorder like hemophilia -fever or infection -Guillain-Barre syndrome or other neurological problems -immune system problems -infection with the human immunodeficiency virus (HIV) or AIDS -low blood platelet counts -multiple sclerosis -an unusual or allergic  reaction to influenza virus vaccine, eggs, chicken proteins, latex, gentamicin, other medicines, foods, dyes or preservatives -pregnant or trying to get pregnant -breast-feeding How should I use this medicine? This  vaccine is for injection into a muscle. It is given by a health care professional. A copy of Vaccine Information Statements will be given before each vaccination. Read this sheet carefully each time. The sheet may change frequently. Talk to your pediatrician regarding the use of this medicine in children. Special care may be needed. Overdosage: If you think you have taken too much of this medicine contact a poison control center or emergency room at once. NOTE: This medicine is only for you. Do not share this medicine with others. What if I miss a dose? This does not apply. What may interact with this medicine? -chemotherapy or radiation therapy -medicines that lower your immune system like etanercept, anakinra, infliximab, and adalimumab -medicines that treat or prevent blood clots like warfarin -phenytoin -steroid medicines like prednisone or cortisone -theophylline -vaccines This list may not describe all possible interactions. Give your health care provider a list of all the medicines, herbs, non-prescription drugs, or dietary supplements you use. Also tell them if you smoke, drink alcohol, or use illegal drugs. Some items may interact with your medicine. What should I watch for while using this medicine? Report any side effects that do not go away within 3 days to your doctor or health care professional. Call your health care provider if any unusual symptoms occur within 6 weeks of receiving this vaccine. You may still catch the flu, but the illness is not usually as bad. You cannot get the flu from the vaccine. The vaccine will not protect against colds or other illnesses that may cause fever. The vaccine is needed every year. What side effects may I notice from receiving this medicine? Side effects that you should report to your doctor or health care professional as soon as possible: -allergic reactions like skin rash, itching or hives, swelling of the face, lips, or tongue Side effects  that usually do not require medical attention (report to your doctor or health care professional if they continue or are bothersome): -fever -headache -muscle aches and pains -pain, tenderness, redness, or swelling at site where injected -weak or tired This list may not describe all possible side effects. Call your doctor for medical advice about side effects. You may report side effects to FDA at 1-800-FDA-1088. Where should I keep my medicine? This vaccine is only given in a clinic, pharmacy, doctor's office, or other health care setting and will not be stored at home. NOTE: This sheet is a summary. It may not cover all possible information. If you have questions about this medicine, talk to your doctor, pharmacist, or health care provider.    2016, Elsevier/Gold Standard. (2008-07-10 09:30:40)

## 2016-10-11 NOTE — Progress Notes (Signed)
Emily Chroman, MD [place] [address]  No diagnosis found.  CURRENT THERAPY: Concurrent chemoXRT with Carboplatin/Paclitaxel.    Primary cancer of right upper lobe of lung ([place])   08/04/2016 PET scan    PET IMPRESSION: 1. 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 Mass is confluent with the right superior hilum and right lower tracheal wall, suggestive of a T4 primary tumor. 2. Postobstructive pneumonia in the right upper lobe. 3. Hypermetabolic ipsilateral and contralateral mediastinal and contralateral hilar lymphadenopathy, suggestive of N3 nodal disease.      08/24/2016 Procedure    Bronchoscopy      08/25/2016 Pathology Results    Diagnosis Endobronchial biopsy, RUL - POORLY DIFFERENTIATED NON-SMALL CELL CARCINOMA. The immunophenotype is consistent with poorly differentiated adenocarcinoma.      09/10/2016 Imaging    MRI brain- No metastatic disease or acute intracranial abnormality.      09/20/2016 -  Chemotherapy    The patient had palonosetron (ALOXI) injection 0.25 mg, 0.25 mg, Intravenous,  Once, 2 of 6 cycles  CARBOplatin (PARAPLATIN) 180 mg in sodium chloride 0.9 % 100 mL chemo infusion, 180 mg (99.1 % of original dose 178.2 mg), Intravenous,  Once, 2 of 6 cycles Dose modification:   (original dose 178.2 mg, Cycle 1)  PACLitaxel (TAXOL) 66 mg in dextrose 5 % 250 mL chemo infusion ( for chemotherapy treatment.          INTERVAL HISTORY: Emily Pope 60 y.o. 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  Emily Pope is accompanied by her husband. She is here for Cycle #4 Carboplatin / Paclitaxel.   She has been taking naproxen for pain and  if her pain continues she takes tramadol. She has not been taking the hydrocodone prescribed at our last visit for pain. States she is not in pain all the time, just sometimes after the radiation she experiences 8/10 in her shoulder blade region.  She sleeps well most of the time though she has bad nights too. Some nights she can't relax and she'll watch television. She goes to bed most nights at 9 to 10 pm.   She has been using Magic Mouthwash. States water tastes really nasty so she has been drinking a lot of lemonade. Swallowing is easier when using a straw. She has been staying away from acidic or spicy foods. She has Boost/Ensure.   She takes tagamet for heartburn which "seems to work".   She has noticed very little nausea or hair loss. She denies swelling or skin irritation.  She has never had a flu shot or the flu.   She believes she is tolerating treatment very well.   Review of Systems  Constitutional: Positive for malaise/fatigue. Negative for chills, fever and weight loss.  HENT: Positive for congestion and sore throat.        Difficulty swallowing  Eyes: Negative.  Negative for blurred vision.  Respiratory: Negative.  Negative for cough.   Cardiovascular: Negative.  Negative for chest pain.  Gastrointestinal: Negative for diarrhea and vomiting.  Genitourinary: Negative.   Musculoskeletal: Positive for joint pain (right shoulder).  Skin: Negative.   Neurological: Positive for weakness.  Endo/Heme/Allergies: Negative.   Psychiatric/Behavioral: Negative.   14 point review of systems was performed and is negative  except as detailed under history of present illness and above   Past Medical History:  Diagnosis Date  . Anxiety    uses it for menopause   . Arthritis   . Cancer ([place])    Right Lung  . Chronic bronchitis ([place])   . COPD (chronic obstructive pulmonary disease) ([place])   . GERD (gastroesophageal reflux disease)   . Headache    sinus  . Hypertension   .  Pneumonia    07/22/16-had pneumonia in right lung and found mass in lung  . Primary cancer of right upper lobe of lung ([place]) 08/23/2016    Past Surgical History:  Procedure Laterality Date  . ABDOMINAL HYSTERECTOMY    . APPENDECTOMY     when had hysterectomy  . ENDOBRONCHIAL ULTRASOUND Bilateral 08/23/2016   Procedure: ENDOBRONCHIAL ULTRASOUND;  Surgeon: Collene Gobble, MD;  Location: [facility] ENDOSCOPY;  Service: Cardiopulmonary;  Laterality: Bilateral;  . HERNIA REPAIR     left inguinal hernia age 60  . PORTACATH PLACEMENT Left 09/17/2016   Procedure: INSERTION PORT-A-CATH LEFT SUBCLAVIAN;  Surgeon: Aviva Signs, MD;  Location: [facility] ORS;  Service: General;  Laterality: Left;  . TUBAL LIGATION      Family History  Problem Relation Age of Onset  . Cancer Mother     lung  . Hypertension Mother   . Hypertension Father   . Diabetes Father   . Hypertension Sister   . Cancer Brother     lung  . Hypertension Sister     Social History   Social History  . Marital status: Divorced    Spouse name: N/A  . Number of children: N/A  . Years of education: N/A   Social History Main Topics  . Smoking status: Former Smoker    Packs/day: 2.00    Years: 43.00    Quit date: 07/20/2016  . Smokeless tobacco: Never Used  . Alcohol use No  . Drug use: No  . Sexual activity: Not on file   Other Topics Concern  . Not on file   Social History Narrative  . No narrative on file     PHYSICAL EXAMINATION  ECOG PERFORMANCE STATUS: 1 - Symptomatic but completely ambulatory   Vitals with BMI 10/11/2016  Height   Weight 119 lbs 6 oz  BMI   Systolic 259  Diastolic 74  Pulse 92  Respirations 16    Physical Exam  Constitutional: She is oriented to person, place, and time and well-developed, well-nourished, and in no distress.  HENT:  Head: Normocephalic and atraumatic.  Nose: Nose normal.  Mouth/Throat: Oropharynx is clear and moist. No oropharyngeal exudate.  Eyes: Conjunctivae and EOM are  normal. Pupils are equal, round, and reactive to light. Right eye exhibits no discharge. Left eye exhibits no discharge. No scleral icterus.  Neck: Normal range of motion. Neck supple. No tracheal deviation present. No thyromegaly present.  Cardiovascular: Normal rate, regular rhythm and normal heart sounds.  Exam reveals no gallop and no friction rub.   No murmur heard. Pulmonary/Chest: Effort normal and breath sounds normal. She has no wheezes. She has no rales.  Abdominal: Soft. Bowel sounds are normal. She exhibits no distension and no mass. There is no tenderness. There is no rebound and no guarding.  Musculoskeletal: Normal range of motion. She exhibits no edema.  Lymphadenopathy:    She has no cervical adenopathy.  Neurological: She is alert and oriented to person, place, and time. She has normal reflexes. No cranial nerve deficit. Gait  normal. Coordination normal.  Skin: Skin is warm and dry. No rash noted.  Psychiatric: Mood, memory, affect and judgment normal.  Nursing note and vitals reviewed.   LABORATORY DATA: I have reviewed the data as listed. CBC    Component Value Date/Time   WBC 5.1 10/04/2016 1058   RBC 3.39 (L) 10/04/2016 1058   HGB 10.8 (L) 10/04/2016 1058   HGB 12.9 09/02/2016 1403   HCT 31.8 (L) 10/04/2016 1058   HCT 38.4 09/02/2016 1403   PLT 323 10/04/2016 1058   PLT 476 (H) 09/02/2016 1403   MCV 93.8 10/04/2016 1058   MCV 94.6 09/02/2016 1403   MCH 31.9 10/04/2016 1058   MCHC 34.0 10/04/2016 1058   RDW 12.4 10/04/2016 1058   RDW 13.6 09/02/2016 1403   LYMPHSABS 0.7 10/04/2016 1058   LYMPHSABS 2.5 09/02/2016 1403   MONOABS 0.5 10/04/2016 1058   MONOABS 0.7 09/02/2016 1403   EOSABS 0.3 10/04/2016 1058   EOSABS 0.7 (H) 09/02/2016 1403   BASOSABS 0.0 10/04/2016 1058   BASOSABS 0.1 09/02/2016 1403      Chemistry      Component Value Date/Time   NA 134 (L) 10/04/2016 1058   NA 140 09/02/2016 1403   K 4.1 10/04/2016 1058   K 4.2 09/02/2016 1403    CL 100 (L) 10/04/2016 1058   CO2 28 10/04/2016 1058   CO2 27 09/02/2016 1403   BUN 15 10/04/2016 1058   BUN 15.6 09/02/2016 1403   CREATININE 0.80 10/04/2016 1058   CREATININE 0.8 09/02/2016 1403      Component Value Date/Time   CALCIUM 9.2 10/04/2016 1058   CALCIUM 9.6 09/02/2016 1403   ALKPHOS 121 10/04/2016 1058   ALKPHOS 159 (H) 09/02/2016 1403   AST 14 (L) 10/04/2016 1058   AST 12 09/02/2016 1403   ALT 12 (L) 10/04/2016 1058   ALT <9 09/02/2016 1403   BILITOT 0.3 10/04/2016 1058   BILITOT <0.30 09/02/2016 1403        PENDING LABS:   RADIOGRAPHIC STUDIES: I have personally reviewed the radiological images as listed and agreed with the findings in the report.  Dg Chest Port 1 View  Result Date: 09/17/2016 CLINICAL DATA:  Power port placement. EXAM: PORTABLE CHEST 1 VIEW COMPARISON:  CT 09/08/2016.  Chest x-ray 07/21/2016. FINDINGS: Power port catheter noted with lead tip projected over superior vena cava. Heart size normal. Right upper lobe mass with atelectasis again noted. No pleural effusion or pneumothorax. No acute bony abnormality . IMPRESSION: 1. PowerPort catheter noted with tip projected over superior vena cava. No complicating features. 2. Right upper lobe mass with atelectasis again noted. Electronically Signed   By: Marcello Moores  Register   On: 09/17/2016 08:54   Dg C-arm 1-60 Min-no Report  Result Date: 09/17/2016 CLINICAL DATA: port a cath insertion C-ARM 1-60 MINUTES Fluoroscopy was utilized by the requesting physician.  No radiographic interpretation.    PATHOLOGY:    ASSESSMENT AND PLAN:  Stage IIIB NSCLC, poorly differentiated, favors squamous cell Concurrent ChemoXRT Radiation induced Esophagitis Anxiety Cancer related pain Chemotherapy induced anemia constipation  Radiation-induced esophagitis Continues to use aleve and is using magic mouthwash which is helping. Does not want anything stronger for pain relief currently. Weight is stable. She was  given a prescription for liquid lortab.  GERD (gastroesophageal reflux disease) Currently using tagamet OTC with good control.   Primary cancer of right upper lobe of lung ([place]) Stage IIIB (T4N3M0) poorly differentiated carcinoma of lung with immunophenotyping consistent with  adenocarcinoma, on concurrent chemoXRT with carboplatin/paclitaxel.   Pre-treatment labs: CBC diff, CMET.  I personally reviewed and went over laboratory results with the patient.  The results are noted within this dictation.  Labs satisfy treatment parameters.  Return as scheduled next week for follow-up and next cycle of chemotherapy. We again address 2 additional cycles of carbo/taxol at the completion of concurrent therapy. She is very open to this.   She does not need refills today.   ORDERS PLACED FOR THIS ENCOUNTER: Orders Placed This Encounter  Procedures  . CBC with Differential    Standing Status:   Future    Standing Expiration Date:   10/11/2017  . Comprehensive metabolic panel    Standing Status:   Future    Standing Expiration Date:   10/11/2017    Flu vaccination will be given today.  THERAPY PLAN:  Continue concurrent chemoradiation with weekly carboplatin/taxol while on Radiation therapy.  This will be followed by 2 cycles of q3weekly carbo-taxol with G-CSF after chemoradiation completed.  We will discuss further at future follow-up.   All questions were answered. The patient knows to call the clinic with any problems, questions or concerns. We can certainly see the patient much sooner if necessary.  This document serves as a record of services personally performed by Ancil Linsey, MD. It was created on her behalf by Arlyce Harman, a trained medical scribe. The creation of this record is based on the scribe's personal observations and the provider's statements to them. This document has been checked and approved by the attending provider.  I have reviewed the above documentation for  accuracy and completeness and I agree with the above.  This note is electronically signed PT:[email or web address] Cyril Mourning, MD  10/11/2016 9:20 AM

## 2016-10-11 NOTE — Patient Instructions (Signed)
[facility] Discharge Instructions for Patients Receiving Chemotherapy   Beginning January 23rd 2017 lab work for the [facility] will be done in the  Main lab at [facility] on 1st floor. If you have a lab appointment with the [facility] please come in thru the  Main Entrance and check in at the main information desk   Today you received the following chemotherapy agents: Taxol and Carboplatin.     If you develop nausea and vomiting, or diarrhea that is not controlled by your medication, call the clinic.  The clinic phone number is [phone]. Office hours are Monday-Friday 8:30am-5:00pm.  BELOW ARE SYMPTOMS THAT SHOULD BE REPORTED IMMEDIATELY:  *FEVER GREATER THAN 101.0 F  *CHILLS WITH OR WITHOUT FEVER  NAUSEA AND VOMITING THAT IS NOT CONTROLLED WITH YOUR NAUSEA MEDICATION  *UNUSUAL SHORTNESS OF BREATH  *UNUSUAL BRUISING OR BLEEDING  TENDERNESS IN MOUTH AND THROAT WITH OR WITHOUT PRESENCE OF ULCERS  *URINARY PROBLEMS  *BOWEL PROBLEMS  UNUSUAL RASH Items with * indicate a potential emergency and should be followed up as soon as possible. If you have an emergency after office hours please contact your primary care physician or go to the nearest emergency department.  Please call the clinic during office hours if you have any questions or concerns.   You may also contact the Patient Navigator at [phone] should you have any questions or need assistance in obtaining follow up care.      Resources For Cancer Patients and their Caregivers ? American Cancer Society: Can assist with transportation, wigs, general needs, runs Look Good Feel Better.        2120492453 ? Cancer Care: Provides financial assistance, online support groups, medication/co-pay assistance.  1-800-813-HOPE 2538703976) ? [place] Assists [place] cancer patients and their families through emotional , educational and financial support.   475-199-3439 ? [place] DSS Where to apply for food stamps, Medicaid and utility assistance. 604-101-0827 ? RCATS: Transportation to medical appointments. 989-658-5223 ? Social Security Administration: May apply for disability if have a Stage IV cancer. [phone] [phone] ? [employer], Disability and Transit Services: Assists with nutrition, care and transit needs. [phone]

## 2016-10-11 NOTE — Progress Notes (Signed)
Patient tolerated infusion well.  VSS.  Patient ambulatory and stable upon discharge from clinic.   

## 2016-10-11 NOTE — Assessment & Plan Note (Addendum)
Continues to use aleve and is using magic mouthwash which is helping. Does not want anything stronger for pain relief currently. Weight is stable. She was given a prescription for liquid lortab.

## 2016-10-13 ENCOUNTER — Encounter: Payer: Self-pay | Admitting: Dietician

## 2016-10-13 NOTE — Progress Notes (Signed)
RD consulted due to painful swallowing as result of radiotherapy  Contacted Pt by Phone   Wt Readings from Last 10 Encounters:  10/11/16 119 lb 6.4 oz (54.2 kg)  10/04/16 117 lb 6.4 oz (53.3 kg)  09/27/16 119 lb 6.4 oz (54.2 kg)  09/20/16 118 lb 12.8 oz (53.9 kg)  09/17/16 117 lb (53.1 kg)  09/14/16 117 lb (53.1 kg)  09/08/16 118 lb 3.2 oz (53.6 kg)  09/02/16 115 lb 11.2 oz (52.5 kg)  08/26/16 117 lb 9.6 oz (53.3 kg)  08/23/16 114 lb (51.7 kg)   Patient weight has Remained stable vs increased by a few lbs.   Patient reports oral intake as fair, thought she is suffering from significant odynophagia.  She states that the pain is not like a sore throat, rather a "muscle pain" and it is the physical motion of swallowing that is painful. Because of this, she says the degree of her pain is essentially not influenced at all by temperature, consistency, seasonings, or PH; anything she eats will bring about pain.  She notes that her pain is better in the morning after her throat "has rested". She has been making her breakfast her biggest meal of the day. She also has been drinking through a straw to "reduce the amount of air she swallows". She says this has slightly helped.   Sherecently went out and purchased Ensure. Though liquids, still hurt, they are "slightly better" then solids because she doesn't need to swallow as much; "Wet food goes down faster". She notes they are expensive given her financial situation.   Pt appears to have been able to compensate well for her throat pain as evidenced by her weight gain. She says "I must be doing something right because im not losing weight". She has a strong support system with her significant other who "force feeds" her and is trying to "fatten her up".   RD explained the Ensure Assistance Program. She was receptive to a case of Ensure.   Mailed my contact info, coupons, and handouts titled "Sore or Irritated throat"   Burtis Junes RD, LDN,  [facility] [facility] Pager: [phone] 10/13/2016 10:19 AM

## 2016-10-18 ENCOUNTER — Encounter (HOSPITAL_COMMUNITY): Payer: Self-pay | Admitting: Oncology

## 2016-10-18 ENCOUNTER — Encounter (HOSPITAL_BASED_OUTPATIENT_CLINIC_OR_DEPARTMENT_OTHER): Payer: Medicaid Other

## 2016-10-18 ENCOUNTER — Encounter (HOSPITAL_BASED_OUTPATIENT_CLINIC_OR_DEPARTMENT_OTHER): Payer: Medicaid Other | Admitting: Oncology

## 2016-10-18 VITALS — BP 111/70 | HR 96 | Temp 98.2°F | Resp 18 | Wt 114.3 lb

## 2016-10-18 DIAGNOSIS — Z5111 Encounter for antineoplastic chemotherapy: Secondary | ICD-10-CM | POA: Diagnosis not present

## 2016-10-18 DIAGNOSIS — C3411 Malignant neoplasm of upper lobe, right bronchus or lung: Secondary | ICD-10-CM | POA: Diagnosis not present

## 2016-10-18 LAB — CBC WITH DIFFERENTIAL/PLATELET
BASOS ABS: 0 10*3/uL (ref 0.0–0.1)
Basophils Relative: 1 %
EOS ABS: 0.1 10*3/uL (ref 0.0–0.7)
Eosinophils Relative: 2 %
HCT: 30.1 % — ABNORMAL LOW (ref 36.0–46.0)
Hemoglobin: 10.3 g/dL — ABNORMAL LOW (ref 12.0–15.0)
LYMPHS PCT: 14 %
Lymphs Abs: 0.5 10*3/uL — ABNORMAL LOW (ref 0.7–4.0)
MCH: 32.2 pg (ref 26.0–34.0)
MCHC: 34.2 g/dL (ref 30.0–36.0)
MCV: 94.1 fL (ref 78.0–100.0)
Monocytes Absolute: 0.4 10*3/uL (ref 0.1–1.0)
Monocytes Relative: 10 %
NEUTROS ABS: 2.7 10*3/uL (ref 1.7–7.7)
NEUTROS PCT: 73 %
PLATELETS: 211 10*3/uL (ref 150–400)
RBC: 3.2 MIL/uL — AB (ref 3.87–5.11)
RDW: 13.6 % (ref 11.5–15.5)
WBC: 3.7 10*3/uL — AB (ref 4.0–10.5)

## 2016-10-18 LAB — COMPREHENSIVE METABOLIC PANEL
ALT: 15 U/L (ref 14–54)
AST: 15 U/L (ref 15–41)
Albumin: 3.7 g/dL (ref 3.5–5.0)
Alkaline Phosphatase: 97 U/L (ref 38–126)
Anion gap: 6 (ref 5–15)
BUN: 19 mg/dL (ref 6–20)
CHLORIDE: 103 mmol/L (ref 101–111)
CO2: 26 mmol/L (ref 22–32)
CREATININE: 0.9 mg/dL (ref 0.44–1.00)
Calcium: 8.7 mg/dL — ABNORMAL LOW (ref 8.9–10.3)
GFR calc non Af Amer: >60 mL/min (ref >60)
Glucose, Bld: 87 mg/dL (ref 65–99)
Potassium: 4.3 mmol/L (ref 3.5–5.1)
SODIUM: 135 mmol/L (ref 135–145)
Total Bilirubin: 0.5 mg/dL (ref 0.3–1.2)
Total Protein: 6.8 g/dL (ref 6.5–8.1)

## 2016-10-18 MED ORDER — SODIUM CHLORIDE 0.9% FLUSH
10.0000 mL | INTRAVENOUS | Status: DC | PRN
  Administered 2016-10-18: 10 mL
  Filled 2016-10-18: qty 10

## 2016-10-18 MED ORDER — FAMOTIDINE IN NACL 20-0.9 MG/50ML-% IV SOLN
INTRAVENOUS | Status: AC
  Filled 2016-10-18: qty 50

## 2016-10-18 MED ORDER — FAMOTIDINE IN NACL 20-0.9 MG/50ML-% IV SOLN
20.0000 mg | Freq: Once | INTRAVENOUS | Status: AC
  Administered 2016-10-18: 20 mg via INTRAVENOUS

## 2016-10-18 MED ORDER — DIPHENHYDRAMINE HCL 50 MG/ML IJ SOLN
50.0000 mg | Freq: Once | INTRAMUSCULAR | Status: AC
  Administered 2016-10-18: 50 mg via INTRAVENOUS

## 2016-10-18 MED ORDER — PALONOSETRON HCL INJECTION 0.25 MG/5ML
0.2500 mg | Freq: Once | INTRAVENOUS | Status: AC
  Administered 2016-10-18: 0.25 mg via INTRAVENOUS

## 2016-10-18 MED ORDER — SODIUM CHLORIDE 0.9 % IV SOLN
INTRAVENOUS | Status: DC
  Administered 2016-10-18: 11:00:00 via INTRAVENOUS

## 2016-10-18 MED ORDER — HEPARIN SOD (PORK) LOCK FLUSH 100 UNIT/ML IV SOLN
500.0000 [IU] | Freq: Once | INTRAVENOUS | Status: AC | PRN
  Administered 2016-10-18: 500 [IU]

## 2016-10-18 MED ORDER — PALONOSETRON HCL INJECTION 0.25 MG/5ML
INTRAVENOUS | Status: AC
  Filled 2016-10-18: qty 5

## 2016-10-18 MED ORDER — PACLITAXEL CHEMO INJECTION 300 MG/50ML
45.0000 mg/m2 | Freq: Once | INTRAVENOUS | Status: AC
  Administered 2016-10-18: 66 mg via INTRAVENOUS
  Filled 2016-10-18: qty 11

## 2016-10-18 MED ORDER — DIPHENHYDRAMINE HCL 50 MG/ML IJ SOLN
INTRAMUSCULAR | Status: AC
  Filled 2016-10-18: qty 1

## 2016-10-18 MED ORDER — SODIUM CHLORIDE 0.9 % IV SOLN
164.0000 mg | Freq: Once | INTRAVENOUS | Status: AC
  Administered 2016-10-18: 160 mg via INTRAVENOUS
  Filled 2016-10-18: qty 16

## 2016-10-18 MED ORDER — SODIUM CHLORIDE 0.9 % IV SOLN
Freq: Once | INTRAVENOUS | Status: AC
  Administered 2016-10-18: 12:00:00 via INTRAVENOUS

## 2016-10-18 MED ORDER — SODIUM CHLORIDE 0.9 % IV SOLN
20.0000 mg | Freq: Once | INTRAVENOUS | Status: AC
  Administered 2016-10-18: 20 mg via INTRAVENOUS
  Filled 2016-10-18: qty 2

## 2016-10-18 NOTE — Progress Notes (Signed)
Emily Chroman, MD [place] [address]  Primary cancer of right upper lobe of lung ([place]) - Plan: DISCONTINUED: 0.9 %  sodium chloride infusion  CURRENT THERAPY: concurrent chemoXRT with Carboplatin/Paclitaxel  INTERVAL HISTORY: Emily Pope 60 y.o. 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    Primary cancer of right upper lobe of lung ([place])   08/04/2016 PET scan    PET IMPRESSION: 1. 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 Mass is confluent with the right superior hilum and right lower tracheal wall, suggestive of a T4 primary tumor. 2. Postobstructive pneumonia in the right upper lobe. 3. Hypermetabolic ipsilateral and contralateral mediastinal and contralateral hilar lymphadenopathy, suggestive of N3 nodal disease.      08/24/2016 Procedure    Bronchoscopy      08/25/2016 Pathology Results    Diagnosis Endobronchial biopsy, RUL - POORLY DIFFERENTIATED NON-SMALL CELL CARCINOMA. The immunophenotype is consistent with poorly differentiated adenocarcinoma.      09/10/2016 Imaging    MRI brain- No metastatic disease or acute intracranial abnormality.      09/20/2016 -  Chemotherapy    The patient had palonosetron (ALOXI) injection 0.25 mg, 0.25 mg, Intravenous,  Once, 2 of 6 cycles  CARBOplatin (PARAPLATIN) 180 mg in sodium chloride 0.9 % 100 mL chemo infusion, 180 mg (99.1 % of original dose 178.2 mg), Intravenous,  Once, 2 of 6 cycles Dose modification:   (original dose 178.2 mg, Cycle 1)  PACLitaxel (TAXOL) 66 mg in dextrose 5 % 250 mL chemo infusion ( for chemotherapy treatment.         She is tolerating treatment well thus far.  She notes a  soreness in her throat, but notes "it is not a sore throat."  She reports that this discomfort hinders her ability to swallow some foods.  She denies dysphagia, but instead notes that it is simply uncomfortable.  She is using Ensure to supplement her intake.  She also is concerned that she is not getting her H2O in adequate quantities.  As a result, I will order additional IV fluids today.  She reports a right upper back "rash" that is pruritic and sore.  She is using moisturizing lotion.  Review of Systems  Constitutional: Positive for weight loss. Negative for chills and fever.  HENT: Negative.   Eyes: Negative.  Negative for blurred vision.  Respiratory: Negative.   Cardiovascular: Negative.  Negative for chest pain.  Gastrointestinal: Positive for heartburn. Negative for constipation, diarrhea, nausea and vomiting.  Genitourinary: Negative.   Musculoskeletal: Negative.   Skin: Negative.   Neurological: Negative.  Negative for weakness and headaches.  Endo/Heme/Allergies: Negative.   Psychiatric/Behavioral: Negative.     Past Medical History:  Diagnosis Date  . Anxiety    uses it for menopause   . Arthritis   . Cancer ([place])    Right Lung  . Chronic bronchitis ([place])   . COPD (chronic obstructive pulmonary disease) ([place])   . GERD (gastroesophageal reflux disease)   . Headache    sinus  . Hypertension   . Pneumonia    07/22/16-had pneumonia in right lung and found mass in lung  . Primary cancer of right upper lobe of lung ([place]) 08/23/2016    Past Surgical History:  Procedure Laterality  Date  . ABDOMINAL HYSTERECTOMY    . APPENDECTOMY     when had hysterectomy  . ENDOBRONCHIAL ULTRASOUND Bilateral 08/23/2016   Procedure: ENDOBRONCHIAL ULTRASOUND;  Surgeon: Collene Gobble, MD;  Location: [facility];  Service: Cardiopulmonary;  Laterality: Bilateral;  . HERNIA REPAIR     left inguinal hernia age 17  . PORTACATH PLACEMENT Left 09/17/2016   Procedure: INSERTION PORT-A-CATH  LEFT SUBCLAVIAN;  Surgeon: Aviva Signs, MD;  Location: [facility];  Service: General;  Laterality: Left;  . TUBAL LIGATION      Family History  Problem Relation Age of Onset  . Cancer Mother     lung  . Hypertension Mother   . Hypertension Father   . Diabetes Father   . Hypertension Sister   . Cancer Brother     lung  . Hypertension Sister     Social History   Social History  . Marital status: Divorced    Spouse name: N/A  . Number of children: N/A  . Years of education: N/A   Social History Main Topics  . Smoking status: Former Smoker    Packs/day: 2.00    Years: 43.00    Quit date: 07/20/2016  . Smokeless tobacco: Never Used  . Alcohol use No  . Drug use: No  . Sexual activity: Not Asked   Other Topics Concern  . None   Social History Narrative  . None     PHYSICAL EXAMINATION  ECOG PERFORMANCE STATUS: 1 - Symptomatic but completely ambulatory  There were no vitals filed for this visit.  Vitals - 1 value per visit 35/57/3220  SYSTOLIC 254  DIASTOLIC 69  Pulse 88  Temperature 98.1  Respirations 18  Weight (lb) 114.3   GENERAL:alert, no distress, comfortable, cooperative, smiling and accompanied by husband SKIN: skin color, texture, turgor are normal, no rashes or significant lesions HEAD: Normocephalic, No masses, lesions, tenderness or abnormalities EYES: normal, EOMI, Conjunctiva are pink and non-injected EARS: External ears normal OROPHARYNX:lips, buccal mucosa, and tongue normal and mucous membranes are moist  NECK: supple, trachea midline LYMPH:  no palpable lymphadenopathy BREAST:not examined LUNGS: clear to auscultation , decreased breath sounds bilaterally. HEART: regular rate & rhythm ABDOMEN:abdomen soft BACK: mild erythema of right upper back with excoriations present. EXTREMITIES:less then 2 second capillary refill, no joint deformities, effusion, or inflammation, no skin discoloration, no cyanosis  NEURO: alert & oriented x 3 with fluent  speech, no focal motor/sensory deficits, gait normal   LABORATORY DATA: CBC    Component Value Date/Time   WBC 3.7 (L) 10/18/2016 1034   RBC 3.20 (L) 10/18/2016 1034   HGB 10.3 (L) 10/18/2016 1034   HGB 12.9 09/02/2016 1403   HCT 30.1 (L) 10/18/2016 1034   HCT 38.4 09/02/2016 1403   PLT 211 10/18/2016 1034   PLT 476 (H) 09/02/2016 1403   MCV 94.1 10/18/2016 1034   MCV 94.6 09/02/2016 1403   MCH 32.2 10/18/2016 1034   MCHC 34.2 10/18/2016 1034   RDW 13.6 10/18/2016 1034   RDW 13.6 09/02/2016 1403   LYMPHSABS 0.5 (L) 10/18/2016 1034   LYMPHSABS 2.5 09/02/2016 1403   MONOABS 0.4 10/18/2016 1034   MONOABS 0.7 09/02/2016 1403   EOSABS 0.1 10/18/2016 1034   EOSABS 0.7 (H) 09/02/2016 1403   BASOSABS 0.0 10/18/2016 1034   BASOSABS 0.1 09/02/2016 1403      Chemistry      Component Value Date/Time   NA 135 10/18/2016 1034   NA 140 09/02/2016 1403  K 4.3 10/18/2016 1034   K 4.2 09/02/2016 1403   CL 103 10/18/2016 1034   CO2 26 10/18/2016 1034   CO2 27 09/02/2016 1403   BUN 19 10/18/2016 1034   BUN 15.6 09/02/2016 1403   CREATININE 0.90 10/18/2016 1034   CREATININE 0.8 09/02/2016 1403      Component Value Date/Time   CALCIUM 8.7 (L) 10/18/2016 1034   CALCIUM 9.6 09/02/2016 1403   ALKPHOS 97 10/18/2016 1034   ALKPHOS 159 (H) 09/02/2016 1403   AST 15 10/18/2016 1034   AST 12 09/02/2016 1403   ALT 15 10/18/2016 1034   ALT <9 09/02/2016 1403   BILITOT 0.5 10/18/2016 1034   BILITOT <0.30 09/02/2016 1403        PENDING LABS:   RADIOGRAPHIC STUDIES:  No results found.   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  Oncology history updated.  Pre-treatment labs: CBC diff, CMET.  I personally reviewed and went over laboratory results with the patient.   The results are noted within this dictation.  Labs satisfy treatment parameters.  Weight loss is noted.  Burtis Junes, RD is following the patient.  She has a free case of Ensure ready for her today.  She is eating foods that are palatable and she notes that her appetite is good.  She has a difficult time swallowing foods secondary to discomfort.  We discussed food choices and options moving forward.  She does have liquid pain medication which helps.  She is concerned with her PO H2O intake and therefore, we will administer extra IV fluids today.  Return as scheduled next week for follow-up and next cycle of chemotherapy.    ORDERS PLACED FOR THIS ENCOUNTER: No orders of the defined types were placed in this encounter.   MEDICATIONS PRESCRIBED THIS ENCOUNTER: No orders of the defined types were placed in this encounter.   THERAPY PLAN:  Continue concurrent chemoradiation with weekly carboplatin/taxol while on Radiation therapy.  This will be followed by 2 cycles of q3weekly carbo-taxol with G-CSF after chemoradiation completed.   All questions were answered. The patient knows to call the clinic with any problems, questions or concerns. We can certainly see the patient much sooner if necessary.  Patient and plan discussed with Dr. Ancil Linsey and she is in agreement with the aforementioned.   This note is electronically signed by: Doy Mince 10/18/2016 11:23 AM

## 2016-10-18 NOTE — Patient Instructions (Signed)
[place] at [facility] Discharge Instructions  RECOMMENDATIONS MADE BY THE CONSULTANT AND ANY TEST RESULTS WILL BE SENT TO YOUR REFERRING PHYSICIAN.   You were seen today by Kirby Crigler PA-C. Follow up in 1 week.   Thank you for choosing [place] at [facility] to provide your oncology and hematology care.  To afford each patient quality time with our provider, please arrive at least 15 minutes before your scheduled appointment time.   Beginning January 23rd 2017 lab work for the [facility] will be done in the  Main lab at [facility] on 1st floor. If you have a lab appointment with the [facility] please come in thru the  Main Entrance and check in at the main information desk  You need to re-schedule your appointment should you arrive 10 or more minutes late.  We strive to give you quality time with our providers, and arriving late affects you and other patients whose appointments are after yours.  Also, if you no show three or more times for appointments you may be dismissed from the clinic at the providers discretion.     Again, thank you for choosing [facility].  Our hope is that these requests will decrease the amount of time that you wait before being seen by our physicians.       _____________________________________________________________  Should you have questions after your visit to [facility], please contact our office at [phone] between the hours of 8:30 a.m. and 4:30 p.m.  Voicemails left after 4:30 p.m. will not be returned until the following business day.  For prescription refill requests, have your pharmacy contact our office.         Resources For Cancer Patients and their Caregivers ? American Cancer Society: Can assist with transportation, wigs, general needs, runs Look Good Feel Better.        [phone] ? Cancer Care: Provides financial assistance, online support  groups, medication/co-pay assistance.  1-800-813-HOPE 808-485-3791) ? [place] Assists [place] cancer patients and their families through emotional , educational and financial support.  737-768-6765 ? [place] DSS Where to apply for food stamps, Medicaid and utility assistance. [phone] ? RCATS: Transportation to medical appointments. 574-034-0587 ? Social Security Administration: May apply for disability if have a Stage IV cancer. [phone] ? [employer], Disability and Transit Services: Assists with nutrition, care and transit needs. [place] Support Programs: '@10RELATIVEDAYS'$ @ > Cancer Support Group  2nd Tuesday of the month 1pm-2pm, Journey Room  > Creative Journey  3rd Tuesday of the month 1130am-1pm, Journey Room  > Look Good Feel Better  1st Wednesday of the month 10am-12 noon, Journey Room (Call [place] to register [phone])

## 2016-10-18 NOTE — Assessment & Plan Note (Addendum)
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  Oncology history updated.  Pre-treatment labs: CBC diff, CMET.  I personally reviewed and went over laboratory results with the patient.  The results are noted within this dictation.  Labs satisfy treatment parameters.  Weight loss is noted.  Burtis Junes, RD is following the patient.  She has a free case of Ensure ready for her today.  She is eating foods that are palatable and she notes that her appetite is good.  She has a difficult time swallowing foods secondary to discomfort.  We discussed food choices and options moving forward.  She does have liquid pain medication which helps.  She is concerned with her PO H2O intake and therefore, we will administer extra IV fluids today.  Return as scheduled next week for follow-up and next cycle of chemotherapy.

## 2016-10-18 NOTE — Patient Instructions (Signed)
[facility] Discharge Instructions for Patients Receiving Chemotherapy   Beginning January 23rd 2017 lab work for the [facility] will be done in the  Main lab at [facility] on 1st floor. If you have a lab appointment with the [facility] please come in thru the  Main Entrance and check in at the main information desk   Today you received the following chemotherapy agents Carboplatin, Taxol  To help prevent nausea and vomiting after your treatment, we encourage you to take your nausea medication     If you develop nausea and vomiting, or diarrhea that is not controlled by your medication, call the clinic.  The clinic phone number is [phone]. Office hours are Monday-Friday 8:30am-5:00pm.  BELOW ARE SYMPTOMS THAT SHOULD BE REPORTED IMMEDIATELY:  *FEVER GREATER THAN 101.0 F  *CHILLS WITH OR WITHOUT FEVER  NAUSEA AND VOMITING THAT IS NOT CONTROLLED WITH YOUR NAUSEA MEDICATION  *UNUSUAL SHORTNESS OF BREATH  *UNUSUAL BRUISING OR BLEEDING  TENDERNESS IN MOUTH AND THROAT WITH OR WITHOUT PRESENCE OF ULCERS  *URINARY PROBLEMS  *BOWEL PROBLEMS  UNUSUAL RASH Items with * indicate a potential emergency and should be followed up as soon as possible. If you have an emergency after office hours please contact your primary care physician or go to the nearest emergency department.  Please call the clinic during office hours if you have any questions or concerns.   You may also contact the Patient Navigator at [phone] should you have any questions or need assistance in obtaining follow up care.      Resources For Cancer Patients and their Caregivers ? American Cancer Society: Can assist with transportation, wigs, general needs, runs Look Good Feel Better.        727-094-2840 ? Cancer Care: Provides financial assistance, online support groups, medication/co-pay assistance.  1-800-813-HOPE (435) 461-6645) ? [place] Assists  [place] cancer patients and their families through emotional , educational and financial support.  856-180-2242 ? [place] DSS Where to apply for food stamps, Medicaid and utility assistance. 6398542391 ? RCATS: Transportation to medical appointments. 732-313-9296 ? Social Security Administration: May apply for disability if have a Stage IV cancer. [phone] ? [employer], Disability and Transit Services: Assists with nutrition, care and transit needs. [phone]

## 2016-10-18 NOTE — Progress Notes (Signed)
Chemotherapy given today. Patient tolerated well without problems. Vitals stable and discharged home with family member. Follow up as scheduled.

## 2016-10-25 ENCOUNTER — Encounter (HOSPITAL_BASED_OUTPATIENT_CLINIC_OR_DEPARTMENT_OTHER): Payer: Medicaid Other | Admitting: Hematology & Oncology

## 2016-10-25 ENCOUNTER — Encounter (HOSPITAL_COMMUNITY): Payer: Self-pay | Admitting: Hematology & Oncology

## 2016-10-25 ENCOUNTER — Encounter (HOSPITAL_BASED_OUTPATIENT_CLINIC_OR_DEPARTMENT_OTHER): Payer: Medicaid Other

## 2016-10-25 VITALS — BP 115/77 | HR 100 | Temp 97.5°F | Resp 20 | Ht 60.0 in | Wt 116.0 lb

## 2016-10-25 VITALS — BP 97/63 | HR 86 | Temp 98.2°F | Resp 18

## 2016-10-25 DIAGNOSIS — Z5111 Encounter for antineoplastic chemotherapy: Secondary | ICD-10-CM

## 2016-10-25 DIAGNOSIS — C3411 Malignant neoplasm of upper lobe, right bronchus or lung: Secondary | ICD-10-CM

## 2016-10-25 DIAGNOSIS — K208 Other esophagitis without bleeding: Secondary | ICD-10-CM

## 2016-10-25 DIAGNOSIS — G893 Neoplasm related pain (acute) (chronic): Secondary | ICD-10-CM | POA: Diagnosis not present

## 2016-10-25 DIAGNOSIS — T66XXXA Radiation sickness, unspecified, initial encounter: Secondary | ICD-10-CM

## 2016-10-25 DIAGNOSIS — D6481 Anemia due to antineoplastic chemotherapy: Secondary | ICD-10-CM | POA: Diagnosis not present

## 2016-10-25 DIAGNOSIS — F419 Anxiety disorder, unspecified: Secondary | ICD-10-CM

## 2016-10-25 LAB — CBC WITH DIFFERENTIAL/PLATELET
Basophils Absolute: 0 10*3/uL (ref 0.0–0.1)
Basophils Relative: 1 %
Eosinophils Absolute: 0.1 10*3/uL (ref 0.0–0.7)
Eosinophils Relative: 4 %
HEMATOCRIT: 29.2 % — AB (ref 36.0–46.0)
Hemoglobin: 10 g/dL — ABNORMAL LOW (ref 12.0–15.0)
LYMPHS PCT: 12 %
Lymphs Abs: 0.3 10*3/uL — ABNORMAL LOW (ref 0.7–4.0)
MCH: 32.3 pg (ref 26.0–34.0)
MCHC: 34.2 g/dL (ref 30.0–36.0)
MCV: 94.2 fL (ref 78.0–100.0)
MONO ABS: 0.3 10*3/uL (ref 0.1–1.0)
MONOS PCT: 12 %
NEUTROS ABS: 1.9 10*3/uL (ref 1.7–7.7)
Neutrophils Relative %: 71 %
Platelets: 234 10*3/uL (ref 150–400)
RBC: 3.1 MIL/uL — ABNORMAL LOW (ref 3.87–5.11)
RDW: 14.2 % (ref 11.5–15.5)
WBC: 2.7 10*3/uL — ABNORMAL LOW (ref 4.0–10.5)

## 2016-10-25 LAB — COMPREHENSIVE METABOLIC PANEL
ALK PHOS: 96 U/L (ref 38–126)
ALT: 12 U/L — ABNORMAL LOW (ref 14–54)
ANION GAP: 8 (ref 5–15)
AST: 15 U/L (ref 15–41)
Albumin: 3.7 g/dL (ref 3.5–5.0)
BILIRUBIN TOTAL: 0.5 mg/dL (ref 0.3–1.2)
BUN: 14 mg/dL (ref 6–20)
CALCIUM: 8.9 mg/dL (ref 8.9–10.3)
CO2: 26 mmol/L (ref 22–32)
Chloride: 102 mmol/L (ref 101–111)
Creatinine, Ser: 0.79 mg/dL (ref 0.44–1.00)
GFR calc non Af Amer: >60 mL/min (ref >60)
GLUCOSE: 88 mg/dL (ref 65–99)
Potassium: 4.1 mmol/L (ref 3.5–5.1)
Sodium: 136 mmol/L (ref 135–145)
TOTAL PROTEIN: 7 g/dL (ref 6.5–8.1)

## 2016-10-25 MED ORDER — HEPARIN SOD (PORK) LOCK FLUSH 100 UNIT/ML IV SOLN
500.0000 [IU] | Freq: Once | INTRAVENOUS | Status: AC | PRN
  Administered 2016-10-25: 500 [IU]
  Filled 2016-10-25: qty 5

## 2016-10-25 MED ORDER — FAMOTIDINE IN NACL 20-0.9 MG/50ML-% IV SOLN
INTRAVENOUS | Status: AC
Start: 2016-10-25 — End: 2016-10-25
  Filled 2016-10-25: qty 50

## 2016-10-25 MED ORDER — DEXTROSE 5 % IV SOLN
45.0000 mg/m2 | Freq: Once | INTRAVENOUS | Status: AC
  Administered 2016-10-25: 66 mg via INTRAVENOUS
  Filled 2016-10-25: qty 11

## 2016-10-25 MED ORDER — LIDOCAINE-PRILOCAINE 2.5-2.5 % EX CREA: 1.0000 "application " | TOPICAL_CREAM | CUTANEOUS | 3 refills | Status: DC | PRN

## 2016-10-25 MED ORDER — DIPHENHYDRAMINE HCL 50 MG/ML IJ SOLN
50.0000 mg | Freq: Once | INTRAMUSCULAR | Status: AC
  Administered 2016-10-25: 50 mg via INTRAVENOUS

## 2016-10-25 MED ORDER — FAMOTIDINE IN NACL 20-0.9 MG/50ML-% IV SOLN
20.0000 mg | Freq: Once | INTRAVENOUS | Status: AC
  Administered 2016-10-25: 20 mg via INTRAVENOUS

## 2016-10-25 MED ORDER — DIPHENHYDRAMINE HCL 50 MG/ML IJ SOLN
INTRAMUSCULAR | Status: AC
  Filled 2016-10-25: qty 1

## 2016-10-25 MED ORDER — SODIUM CHLORIDE 0.9 % IV SOLN
Freq: Once | INTRAVENOUS | Status: AC
  Administered 2016-10-25: 11:00:00 via INTRAVENOUS

## 2016-10-25 MED ORDER — PALONOSETRON HCL INJECTION 0.25 MG/5ML
INTRAVENOUS | Status: AC
Start: 2016-10-25 — End: 2016-10-25
  Filled 2016-10-25: qty 5

## 2016-10-25 MED ORDER — SODIUM CHLORIDE 0.9 % IV SOLN
178.2000 mg | Freq: Once | INTRAVENOUS | Status: AC
  Administered 2016-10-25: 180 mg via INTRAVENOUS
  Filled 2016-10-25: qty 18

## 2016-10-25 MED ORDER — SODIUM CHLORIDE 0.9% FLUSH
10.0000 mL | INTRAVENOUS | Status: DC | PRN
  Administered 2016-10-25: 10 mL
  Filled 2016-10-25: qty 10

## 2016-10-25 MED ORDER — PALONOSETRON HCL INJECTION 0.25 MG/5ML
0.2500 mg | Freq: Once | INTRAVENOUS | Status: AC
  Administered 2016-10-25: 0.25 mg via INTRAVENOUS

## 2016-10-25 MED ORDER — PROCHLORPERAZINE MALEATE 10 MG PO TABS: 10.0000 mg | ORAL_TABLET | Freq: Four times a day (QID) | ORAL | 1 refills | Status: DC | PRN

## 2016-10-25 MED ORDER — ONDANSETRON HCL 8 MG PO TABS: 8.0000 mg | ORAL_TABLET | Freq: Two times a day (BID) | ORAL | 1 refills | Status: DC | PRN

## 2016-10-25 MED ORDER — DEXAMETHASONE SODIUM PHOSPHATE 100 MG/10ML IJ SOLN
20.0000 mg | Freq: Once | INTRAMUSCULAR | Status: AC
  Administered 2016-10-25: 20 mg via INTRAVENOUS
  Filled 2016-10-25: qty 2

## 2016-10-25 MED ORDER — CIPROFLOXACIN HCL 500 MG PO TABS: 500.0000 mg | ORAL_TABLET | Freq: Two times a day (BID) | ORAL | 0 refills | Status: DC

## 2016-10-25 NOTE — Patient Instructions (Signed)
[place] at [facility] Discharge Instructions  RECOMMENDATIONS MADE BY THE CONSULTANT AND ANY TEST RESULTS WILL BE SENT TO YOUR REFERRING PHYSICIAN.  You saw Dr.Penland today. Follow up in 2 weeks. Next chemo in 3 weeks. See Amy at checkout for appointments.  Thank you for choosing [place] at [facility] to provide your oncology and hematology care.  To afford each patient quality time with our provider, please arrive at least 15 minutes before your scheduled appointment time.   Beginning January 23rd 2017 lab work for the [facility] will be done in the  Main lab at [facility] on 1st floor. If you have a lab appointment with the [facility] please come in thru the  Main Entrance and check in at the main information desk  You need to re-schedule your appointment should you arrive 10 or more minutes late.  We strive to give you quality time with our providers, and arriving late affects you and other patients whose appointments are after yours.  Also, if you no show three or more times for appointments you may be dismissed from the clinic at the providers discretion.     Again, thank you for choosing [facility].  Our hope is that these requests will decrease the amount of time that you wait before being seen by our physicians.       _____________________________________________________________  Should you have questions after your visit to [facility], please contact our office at [phone] between the hours of 8:30 a.m. and 4:30 p.m.  Voicemails left after 4:30 p.m. will not be returned until the following business day.  For prescription refill requests, have your pharmacy contact our office.         Resources For Cancer Patients and their Caregivers ? American Cancer Society: Can assist with transportation, wigs, general needs, runs Look Good Feel Better.        ([phone] ? Cancer  Care: Provides financial assistance, online support groups, medication/co-pay assistance.  1-800-813-HOPE 517-214-3105) ? [place] Assists [place] cancer patients and their families through emotional , educational and financial support.  709-185-0246 ? [place] DSS Where to apply for food stamps, Medicaid and utility assistance. 843-789-4641 ? RCATS: Transportation to medical appointments. ([phone] ? Social Security Administration: May apply for disability if have a Stage IV cancer. [phone] ? [employer], Disability and Transit Services: Assists with nutrition, care and transit needs. [place] Support Programs: '@10RELATIVEDAYS'$ @ > Cancer Support Group  2nd Tuesday of the month 1pm-2pm, Journey Room  > Creative Journey  3rd Tuesday of the month 1130am-1pm, Journey Room  > Look Good Feel Better  1st Wednesday of the month 10am-12 noon, Journey Room (Call [place] to register [phone])

## 2016-10-25 NOTE — Progress Notes (Signed)
Tolerated chemo well. Stable and ambulatory on discharge home with significant other.

## 2016-10-25 NOTE — Patient Instructions (Signed)
[facility] Discharge Instructions for Patients Receiving Chemotherapy   Beginning January 23rd 2017 lab work for the Cancer Center will be done in the  Main lab at [facility] on 1st floor. If you have a lab appointment with the Cancer Center please come in thru the  Main Entrance and check in at the main information desk   Today you received the following chemotherapy agents Taxol and Carbo.  If you develop nausea and vomiting, or diarrhea that is not controlled by your medication, call the clinic.  The clinic phone number is [phone]. Office hours are Monday-Friday 8:30am-5:00pm.  BELOW ARE SYMPTOMS THAT SHOULD BE REPORTED IMMEDIATELY:  *FEVER GREATER THAN 101.0 F  *CHILLS WITH OR WITHOUT FEVER  NAUSEA AND VOMITING THAT IS NOT CONTROLLED WITH YOUR NAUSEA MEDICATION  *UNUSUAL SHORTNESS OF BREATH  *UNUSUAL BRUISING OR BLEEDING  TENDERNESS IN MOUTH AND THROAT WITH OR WITHOUT PRESENCE OF ULCERS  *URINARY PROBLEMS  *BOWEL PROBLEMS  UNUSUAL RASH Items with * indicate a potential emergency and should be followed up as soon as possible. If you have an emergency after office hours please contact your primary care physician or go to the nearest emergency department.  Please call the clinic during office hours if you have any questions or concerns.   You may also contact the Patient Navigator at [phone] should you have any questions or need assistance in obtaining follow up care.      Resources For Cancer Patients and their Caregivers ? American Cancer Society: Can assist with transportation, wigs, general needs, runs Look Good Feel Better.        1-888-227-6333 ? Cancer Care: Provides financial assistance, online support groups, medication/co-pay assistance.  1-800-813-HOPE (4673) ? [facility] Assists [place] cancer patients and their families through emotional , educational and financial support.   336-427-4357 ? [place] DSS Where to apply for food stamps, Medicaid and utility assistance. 336-342-1394 ? [facility]: Transportation to medical appointments. 336-347-2287 ? Social Security Administration: May apply for disability if have a Stage IV cancer. 336-342-7796 1-800-772-1213 ? [place] [facility]ging, Disability and Transit Services: Assists with nutrition, care and transit needs. 336-349-2343         

## 2016-10-25 NOTE — Progress Notes (Signed)
Emily Chroman, MD [place] [id number]  No diagnosis found.  CURRENT THERAPY: Concurrent chemoXRT with Carboplatin/Paclitaxel.    Primary cancer of right upper lobe of lung ([place])   08/04/2016 PET scan    PET IMPRESSION: 1. 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 Mass is confluent with the right superior hilum and right lower tracheal wall, suggestive of a T4 primary tumor. 2. Postobstructive pneumonia in the right upper lobe. 3. Hypermetabolic ipsilateral and contralateral mediastinal and contralateral hilar lymphadenopathy, suggestive of N3 nodal disease.      08/24/2016 Procedure    Bronchoscopy      08/25/2016 Pathology Results    Diagnosis Endobronchial biopsy, RUL - POORLY DIFFERENTIATED NON-SMALL CELL CARCINOMA. The immunophenotype is consistent with poorly differentiated adenocarcinoma.      09/10/2016 Imaging    MRI brain- No metastatic disease or acute intracranial abnormality.      09/20/2016 -  Chemotherapy    The patient had palonosetron (ALOXI) injection 0.25 mg, 0.25 mg, Intravenous,  Once, 2 of 6 cycles  CARBOplatin (PARAPLATIN) 180 mg in sodium chloride 0.9 % 100 mL chemo infusion, 180 mg (99.1 % of original dose 178.2 mg), Intravenous,  Once, 2 of 6 cycles Dose modification:   (original dose 178.2 mg, Cycle 1)  PACLitaxel (TAXOL) 66 mg in dextrose 5 % 250 mL chemo infusion ( for chemotherapy treatment.          INTERVAL HISTORY: Emily Pope 60 y.o. 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  Emily Pope is accompanied by her husband. She states that she feels great and that she still drives to her doctors appointments.   Patient  complains of a "burning" throat that is causing difficulty eating. She describes it as "sore, but not like a sore throat". She has a good appetite, but she feels as if there is a "muscle spasm" in her throat. Warm food is easier to swallow than cold food and straws also help alleviate the discomfort.   She also reports the "beginning of a right dental abscess" which she uses hot salt water every 30 minutes to help alleviate this pain.   The patient complains of some nausea, but denies any vomiting. She takes the 12 hour anti-nausea pill initially, and if that doesn't resolve it, she takes the 6 hour anti-nausea pill.    She is also experiencing fatigue, but that is pretty normal for her.  Her O2 levels are good, with only the occasional use of an inhaler.   She requested a refill on her nausea and numbing medication as a printed copy. She has no other complaints today. This is her last week of XRT.    Review of Systems  Constitutional: Positive for malaise/fatigue.  HENT:       Esophageal burning  Eyes: Negative.   Respiratory: Negative.   Cardiovascular: Negative.   Gastrointestinal: Positive for nausea. Negative for vomiting.       Slight nausea  Genitourinary: Negative.   Skin: Negative.   Endo/Heme/Allergies: Negative.   Psychiatric/Behavioral: Negative.   14 point review of systems was performed and is negative except as detailed under history of present illness and above   Past Medical History:  Diagnosis Date  . Anxiety    uses it  for menopause   . Arthritis   . Cancer ([place])    Right Lung  . Chronic bronchitis ([place])   . COPD (chronic obstructive pulmonary disease) ([place])   . GERD (gastroesophageal reflux disease)   . Headache    sinus  . Hypertension   . Pneumonia    07/22/16-had pneumonia in right lung and found mass in lung  . Primary cancer of right upper lobe of lung ([place]) 08/23/2016    Past Surgical History:  Procedure Laterality Date  . ABDOMINAL  HYSTERECTOMY    . APPENDECTOMY     when had hysterectomy  . ENDOBRONCHIAL ULTRASOUND Bilateral 08/23/2016   Procedure: ENDOBRONCHIAL ULTRASOUND;  Surgeon: Collene Gobble, MD;  Location: [facility];  Service: Cardiopulmonary;  Laterality: Bilateral;  . HERNIA REPAIR     left inguinal hernia age 56  . PORTACATH PLACEMENT Left 09/17/2016   Procedure: INSERTION PORT-A-CATH LEFT SUBCLAVIAN;  Surgeon: Aviva Signs, MD;  Location: [facility];  Service: General;  Laterality: Left;  . TUBAL LIGATION      Family History  Problem Relation Age of Onset  . Cancer Mother     lung  . Hypertension Mother   . Hypertension Father   . Diabetes Father   . Hypertension Sister   . Cancer Brother     lung  . Hypertension Sister     Social History   Social History  . Marital status: Divorced    Spouse name: N/A  . Number of children: N/A  . Years of education: N/A   Social History Main Topics  . Smoking status: Former Smoker    Packs/day: 2.00    Years: 43.00    Quit date: 07/20/2016  . Smokeless tobacco: Never Used  . Alcohol use No  . Drug use: No  . Sexual activity: Not on file   Other Topics Concern  . Not on file   Social History Narrative  . No narrative on file     PHYSICAL EXAMINATION  ECOG PERFORMANCE STATUS: 1 - Symptomatic but completely ambulatory  Vitals with BMI 10/25/2016  Height '5\' 0"'$   Weight 116 lbs  BMI 56.2  Systolic 130  Diastolic 77  Pulse 865  Respirations 20   Physical Exam  Constitutional: She is oriented to person, place, and time and well-developed, well-nourished, and in no distress.  HENT:  Head: Normocephalic and atraumatic.  Nose: Nose normal.  Mouth/Throat: Oropharynx is clear and moist. No oropharyngeal exudate.  Eyes: Conjunctivae and EOM are normal. Pupils are equal, round, and reactive to light. Right eye exhibits no discharge. Left eye exhibits no discharge. No scleral icterus.  Neck: Normal range of motion. Neck supple. No tracheal  deviation present. No thyromegaly present.  Cardiovascular: Normal rate, regular rhythm and normal heart sounds.  Exam reveals no gallop and no friction rub.   No murmur heard. Pulmonary/Chest: Effort normal and breath sounds normal. She has no wheezes. She has no rales.  Abdominal: Soft. Bowel sounds are normal. She exhibits no distension and no mass. There is no tenderness. There is no rebound and no guarding.  Musculoskeletal: Normal range of motion. She exhibits no edema.  Lymphadenopathy:    She has no cervical adenopathy.  Neurological: She is alert and oriented to person, place, and time. She has normal reflexes. No cranial nerve deficit. Gait normal. Coordination normal.  Skin: Skin is warm and dry. No rash noted.  Psychiatric: Mood, memory, affect and judgment normal.  Nursing note and vitals reviewed.  LABORATORY DATA: I have reviewed the data as listed. CBC    Component Value Date/Time   WBC 3.7 (L) 10/18/2016 1034   RBC 3.20 (L) 10/18/2016 1034   HGB 10.3 (L) 10/18/2016 1034   HGB 12.9 09/02/2016 1403   HCT 30.1 (L) 10/18/2016 1034   HCT 38.4 09/02/2016 1403   PLT 211 10/18/2016 1034   PLT 476 (H) 09/02/2016 1403   MCV 94.1 10/18/2016 1034   MCV 94.6 09/02/2016 1403   MCH 32.2 10/18/2016 1034   MCHC 34.2 10/18/2016 1034   RDW 13.6 10/18/2016 1034   RDW 13.6 09/02/2016 1403   LYMPHSABS 0.5 (L) 10/18/2016 1034   LYMPHSABS 2.5 09/02/2016 1403   MONOABS 0.4 10/18/2016 1034   MONOABS 0.7 09/02/2016 1403   EOSABS 0.1 10/18/2016 1034   EOSABS 0.7 (H) 09/02/2016 1403   BASOSABS 0.0 10/18/2016 1034   BASOSABS 0.1 09/02/2016 1403      Chemistry      Component Value Date/Time   NA 135 10/18/2016 1034   NA 140 09/02/2016 1403   K 4.3 10/18/2016 1034   K 4.2 09/02/2016 1403   CL 103 10/18/2016 1034   CO2 26 10/18/2016 1034   CO2 27 09/02/2016 1403   BUN 19 10/18/2016 1034   BUN 15.6 09/02/2016 1403   CREATININE 0.90 10/18/2016 1034   CREATININE 0.8 09/02/2016  1403      Component Value Date/Time   CALCIUM 8.7 (L) 10/18/2016 1034   CALCIUM 9.6 09/02/2016 1403   ALKPHOS 97 10/18/2016 1034   ALKPHOS 159 (H) 09/02/2016 1403   AST 15 10/18/2016 1034   AST 12 09/02/2016 1403   ALT 15 10/18/2016 1034   ALT <9 09/02/2016 1403   BILITOT 0.5 10/18/2016 1034   BILITOT <0.30 09/02/2016 1403        PENDING LABS:   RADIOGRAPHIC STUDIES: I have personally reviewed the radiological images as listed and agreed with the findings in the report.  CLINICAL DATA:  Power port placement.  EXAM: PORTABLE CHEST 1 VIEW  COMPARISON:  CT 09/08/2016.  Chest x-ray 07/21/2016.  FINDINGS: Power port catheter noted with lead tip projected over superior vena cava. Heart size normal. Right upper lobe mass with atelectasis again noted. No pleural effusion or pneumothorax. No acute bony abnormality .  IMPRESSION: 1. PowerPort catheter noted with tip projected over superior vena cava. No complicating features.  2. Right upper lobe mass with atelectasis again noted.   Electronically Signed   By: Marcello Moores  Register   On: 09/17/2016 08:54  PATHOLOGY:    ASSESSMENT AND PLAN:  Stage IIIB NSCLC, poorly differentiated, favors squamous cell Concurrent ChemoXRT Radiation induced Esophagitis Anxiety Cancer related pain Chemotherapy induced anemia constipation   I have refilled her anti-nausea medications and [place] and given her these as a printed prescription as per the patient's request.  Labs are reviewed with the patient today, results are noted above.  I have sent a prescription for Cipro to the patient's pharmacy for dental abscess per the patient's request. She notes she cannot tolerate clindamycin or pcn. We have discussed consolidation with 2 cycles of carboplatin/taxol at the completion of concurrent therapy. We discussed this again today. She has magic mouthwash for her esophagitis. Overall she is doing well, weight is maintained. She is  scheduled for a follow up with Kirby Crigler on 11/08/2016.  THERAPY PLAN:  Continue concurrent chemoradiation with weekly carboplatin/taxol while on Radiation therapy.  This will be followed by 2 cycles of q3weekly carbo-taxol with  G-CSF after chemoradiation completed.  We will discuss further at future follow-up.   All questions were answered. The patient knows to call the clinic with any problems, questions or concerns. We can certainly see the patient much sooner if necessary.  This document serves as a record of services personally performed by Ancil Linsey, MD. It was created on her behalf by Martinique Casey, a trained medical scribe. The creation of this record is based on the scribe's personal observations and the provider's statements to them. This document has been checked and approved by the attending provider.  I have reviewed the above documentation for accuracy and completeness and I agree with the above.  This note is electronically signed [id number],VBTYOMA Cyril Mourning, MD  10/25/2016 8:25 AM

## 2016-10-27 ENCOUNTER — Other Ambulatory Visit (HOSPITAL_COMMUNITY): Payer: Self-pay | Admitting: Emergency Medicine

## 2016-10-27 NOTE — Progress Notes (Signed)
Pt has finished concurrent chemo/radiation.  Made new appt for 2 cycles of carbo/taxol every 3 weeks to finish her treatment.

## 2016-10-28 ENCOUNTER — Other Ambulatory Visit (HOSPITAL_COMMUNITY): Payer: Self-pay | Admitting: Emergency Medicine

## 2016-10-29 ENCOUNTER — Other Ambulatory Visit (HOSPITAL_COMMUNITY): Payer: Self-pay | Admitting: Hematology & Oncology

## 2016-10-29 NOTE — Progress Notes (Signed)
DISCONTINUE ON PATHWAY REGIMEN - Non-Small Cell Lung  [id number]: Carboplatin AUC=2 + Paclitaxel 45 mg/m2 Weekly During Radiation   Administer weekly:     Paclitaxel (Taxol(R)) 45 mg/m2 in 250 mL NS IV over 1 hour followed by Dose Mod: None     Carboplatin (Paraplatin(R)) AUC=2 in 100 mL NS IV over 30 minutes Dose Mod: None Additional Orders: * All AUC calculations intended to be used in [employer] formula  **Always confirm dose/schedule in your pharmacy ordering system**    REASON: Continuation Of Treatment PRIOR TREATMENT: [id number]: Carboplatin AUC=2 + Paclitaxel 45 mg/m2 Weekly During Radiation TREATMENT RESPONSE: Unable to Evaluate  START ON PATHWAY REGIMEN - Non-Small Cell Lung  [id number]: Carboplatin AUC=6 + Paclitaxel 200 mg/m2 q21 Days x 2-3 Cycles   A cycle is every 21 days:     Paclitaxel (Taxol(R)) 200 mg/m2 in 500 mL NS IV over 3 hours followed by Dose Mod: None     Carboplatin (Paraplatin(R)) AUC=6 in 250 mL NS IV over 1 hour Dose Mod: None Additional Orders: * All AUC calculations intended to be used in [employer] formula  **Always confirm dose/schedule in your pharmacy ordering system**    Patient Characteristics: Stage III - Unresectable, PS = 0, 1 AJCC M Stage: X AJCC N Stage: X AJCC T Stage: X Current Disease Status: No Distant Metastases or Local Recurrence AJCC Stage Grouping: IIIB Performance Status: PS = 0, 1  Intent of Therapy: Curative Intent, Discussed with Patient

## 2016-11-01 ENCOUNTER — Other Ambulatory Visit (HOSPITAL_COMMUNITY): Payer: Self-pay | Admitting: Oncology

## 2016-11-08 ENCOUNTER — Encounter (HOSPITAL_COMMUNITY): Payer: Medicaid Other | Attending: Hematology & Oncology | Admitting: Oncology

## 2016-11-08 ENCOUNTER — Encounter (HOSPITAL_COMMUNITY): Payer: Self-pay | Admitting: Oncology

## 2016-11-08 DIAGNOSIS — C3411 Malignant neoplasm of upper lobe, right bronchus or lung: Secondary | ICD-10-CM | POA: Insufficient documentation

## 2016-11-08 DIAGNOSIS — Z9221 Personal history of antineoplastic chemotherapy: Secondary | ICD-10-CM | POA: Insufficient documentation

## 2016-11-08 DIAGNOSIS — R634 Abnormal weight loss: Secondary | ICD-10-CM | POA: Diagnosis not present

## 2016-11-08 NOTE — Progress Notes (Signed)
Emily Chroman, MD [name] [id number]  Primary cancer of right upper lobe of lung ([place])  CURRENT THERAPY: concurrent chemoXRT with Carboplatin/Paclitaxel  INTERVAL HISTORY: Emily Pope 60 y.o. 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    Primary cancer of right upper lobe of lung ([place])   08/04/2016 PET scan    PET IMPRESSION: 1. 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 Mass is confluent with the right superior hilum and right lower tracheal wall, suggestive of a T4 primary tumor. 2. Postobstructive pneumonia in the right upper lobe. 3. Hypermetabolic ipsilateral and contralateral mediastinal and contralateral hilar lymphadenopathy, suggestive of N3 nodal disease.      08/24/2016 Procedure    Bronchoscopy      08/25/2016 Pathology Results    Diagnosis Endobronchial biopsy, RUL - POORLY DIFFERENTIATED NON-SMALL CELL CARCINOMA. The immunophenotype is consistent with poorly differentiated adenocarcinoma.      09/10/2016 Imaging    MRI brain- No metastatic disease or acute intracranial abnormality.      09/20/2016 - 10/25/2016 Chemotherapy    The patient had palonosetron (ALOXI) injection 0.25 mg, 0.25 mg, Intravenous,  Once, 6 of 6 cycles  CARBOplatin (PARAPLATIN) 180 mg in sodium chloride 0.9 % 100 mL chemo infusion, 180 mg (99.1 % of original dose 178.2 mg), Intravenous,  Once, 6 of 6 cycles Dose modification:   (original dose 178.2 mg, Cycle 1)  PACLitaxel (TAXOL) 66 mg in dextrose 5 % 250 mL chemo infusion ( palonosetron (ALOXI) injection 0.25 mg, 0.25 mg, Intravenous,  Once, 0 of 2 cycles  pegfilgrastim (NEULASTA) injection 6 mg, 6 mg, Subcutaneous,   Once, 0 of 2 cycles  pegfilgrastim (NEULASTA ONPRO KIT) injection 6 mg, 6 mg, Subcutaneous, Once, 0 of 2 cycles  CARBOplatin (PARAPLATIN) in sodium chloride 0.9 % 100 mL chemo infusion, , Intravenous,  Once, 0 of 2 cycles  PACLitaxel (TAXOL) 300 mg in dextrose 5 % 250 mL chemo infusion (> 14m/m2), 200 mg/m2 = 300 mg, Intravenous,  Once, 0 of 2 cycles  for chemotherapy treatment.         Chemotherapy    The patient had palonosetron (ALOXI) injection 0.25 mg, 0.25 mg, Intravenous,  Once, 6 of 6 cycles  CARBOplatin (PARAPLATIN) 180 mg in sodium chloride 0.9 % 100 mL chemo infusion, 180 mg (99.1 % of original dose 178.2 mg), Intravenous,  Once, 6 of 6 cycles Dose modification:   (original dose 178.2 mg, Cycle 1)  PACLitaxel (TAXOL) 66 mg in dextrose 5 % 250 mL chemo infusion ( palonosetron (ALOXI) injection 0.25 mg, 0.25 mg, Intravenous,  Once, 0 of 2 cycles  pegfilgrastim (NEULASTA) injection 6 mg, 6 mg, Subcutaneous,  Once, 0 of 2 cycles  pegfilgrastim (NEULASTA ONPRO KIT) injection 6 mg, 6 mg, Subcutaneous, Once, 0 of 2 cycles  CARBOplatin (PARAPLATIN) in sodium chloride 0.9 % 100 mL chemo infusion, , Intravenous,  Once, 0 of 2 cycles  PACLitaxel (TAXOL) 300 mg in dextrose 5 % 250 mL chemo infusion (> 867mm2), 200 mg/m2 = 300 mg, Intravenous,  Once, 0 of 2 cycles  for chemotherapy treatment.        She notes difficulty swallowing that is improving.  She denies food getting stuck or choked.  She denies any cough.  She notes  a strong appetite.  Her husband reports that all of this is improving as she ate 2 pieces of pizza over the weekend which is a significant improvement compared to last week.  Weight loss is noted.  She is trying ENSURE at home, but the metallic taste makes this unpalatable.  Burtis Junes, RD is following.  She is concerned about not having imaging since her time of diagnosis.  She knows the reason for waiting 4-8 weeks post XRT prior to imaging.  She just  mentions this is a concern.  Review of Systems  Constitutional: Positive for weight loss. Negative for chills and fever.  HENT: Negative.   Eyes: Negative.  Negative for double vision.  Respiratory: Negative.  Negative for cough, hemoptysis and sputum production.   Cardiovascular: Negative.  Negative for chest pain.  Gastrointestinal: Negative.  Negative for constipation, diarrhea, nausea and vomiting.  Genitourinary: Negative.   Musculoskeletal: Negative.   Skin: Negative.   Neurological: Negative.  Negative for weakness.  Endo/Heme/Allergies: Negative.   Psychiatric/Behavioral: Negative.     Past Medical History:  Diagnosis Date  . Anxiety    uses it for menopause   . Arthritis   . Cancer ([place])    Right Lung  . Chronic bronchitis ([place])   . COPD (chronic obstructive pulmonary disease) ([place])   . GERD (gastroesophageal reflux disease)   . Headache    sinus  . Hypertension   . Pneumonia    07/22/16-had pneumonia in right lung and found mass in lung  . Primary cancer of right upper lobe of lung ([place]) 08/23/2016    Past Surgical History:  Procedure Laterality Date  . ABDOMINAL HYSTERECTOMY    . APPENDECTOMY     when had hysterectomy  . ENDOBRONCHIAL ULTRASOUND Bilateral 08/23/2016   Procedure: ENDOBRONCHIAL ULTRASOUND;  Surgeon: Collene Gobble, MD;  Location: [facility];  Service: Cardiopulmonary;  Laterality: Bilateral;  . HERNIA REPAIR     left inguinal hernia age 89  . PORTACATH PLACEMENT Left 09/17/2016   Procedure: INSERTION PORT-A-CATH LEFT SUBCLAVIAN;  Surgeon: Aviva Signs, MD;  Location: [facility];  Service: General;  Laterality: Left;  . TUBAL LIGATION      Family History  Problem Relation Age of Onset  . Cancer Mother     lung  . Hypertension Mother   . Hypertension Father   . Diabetes Father   . Hypertension Sister   . Cancer Brother     lung  . Hypertension Sister     Social History   Social History  . Marital status: Divorced    Spouse name: N/A    . Number of children: N/A  . Years of education: N/A   Social History Main Topics  . Smoking status: Former Smoker    Packs/day: 2.00    Years: 43.00    Quit date: 07/20/2016  . Smokeless tobacco: Never Used  . Alcohol use No  . Drug use: No  . Sexual activity: Not Asked   Other Topics Concern  . None   Social History Narrative  . None     PHYSICAL EXAMINATION  ECOG PERFORMANCE STATUS: 1 - Symptomatic but completely ambulatory  Vitals:   11/08/16 0822  BP: 123/79  Pulse: (!) 108  Resp: 18  Temp: 97.9 F (36.6 C)     GENERAL:alert, no distress, comfortable, cooperative, smiling and accompanied by husband SKIN: skin color, texture, turgor are normal, no rashes or significant lesions HEAD: Normocephalic, No masses, lesions, tenderness or abnormalities EYES: normal,  EOMI, Conjunctiva are pink and non-injected EARS: External ears normal OROPHARYNX:lips, buccal mucosa, and tongue normal and mucous membranes are moist  NECK: supple, trachea midline LYMPH:  no palpable lymphadenopathy BREAST:not examined LUNGS: clear to auscultation , decreased breath sounds bilaterally. HEART: regular rate & rhythm ABDOMEN:abdomen soft BACK: mild erythema of right upper back EXTREMITIES:less then 2 second capillary refill, no joint deformities, effusion, or inflammation, no skin discoloration, no cyanosis  NEURO: alert & oriented x 3 with fluent speech, no focal motor/sensory deficits, gait normal   LABORATORY DATA: CBC    Component Value Date/Time   WBC 2.7 (L) 10/25/2016 0951   RBC 3.10 (L) 10/25/2016 0951   HGB 10.0 (L) 10/25/2016 0951   HGB 12.9 09/02/2016 1403   HCT 29.2 (L) 10/25/2016 0951   HCT 38.4 09/02/2016 1403   PLT 234 10/25/2016 0951   PLT 476 (H) 09/02/2016 1403   MCV 94.2 10/25/2016 0951   MCV 94.6 09/02/2016 1403   MCH 32.3 10/25/2016 0951   MCHC 34.2 10/25/2016 0951   RDW 14.2 10/25/2016 0951   RDW 13.6 09/02/2016 1403   LYMPHSABS 0.3 (L) 10/25/2016 0951    LYMPHSABS 2.5 09/02/2016 1403   MONOABS 0.3 10/25/2016 0951   MONOABS 0.7 09/02/2016 1403   EOSABS 0.1 10/25/2016 0951   EOSABS 0.7 (H) 09/02/2016 1403   BASOSABS 0.0 10/25/2016 0951   BASOSABS 0.1 09/02/2016 1403      Chemistry      Component Value Date/Time   NA 136 10/25/2016 0951   NA 140 09/02/2016 1403   K 4.1 10/25/2016 0951   K 4.2 09/02/2016 1403   CL 102 10/25/2016 0951   CO2 26 10/25/2016 0951   CO2 27 09/02/2016 1403   BUN 14 10/25/2016 0951   BUN 15.6 09/02/2016 1403   CREATININE 0.79 10/25/2016 0951   CREATININE 0.8 09/02/2016 1403      Component Value Date/Time   CALCIUM 8.9 10/25/2016 0951   CALCIUM 9.6 09/02/2016 1403   ALKPHOS 96 10/25/2016 0951   ALKPHOS 159 (H) 09/02/2016 1403   AST 15 10/25/2016 0951   AST 12 09/02/2016 1403   ALT 12 (L) 10/25/2016 0951   ALT <9 09/02/2016 1403   BILITOT 0.5 10/25/2016 0951   BILITOT <0.30 09/02/2016 1403        PENDING LABS:   RADIOGRAPHIC STUDIES:  No results found.   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  Oncology history updated.  Pre-treatment labs next week: CBC diff, CMET.  I personally reviewed and went over laboratory results with the patient.  The results are noted within this dictation.    Weight loss is noted.  Burtis Junes, RD is following the patient.  She has received free case of Ensure CANS, but she does not tolerate this as it has a metallic taste.  I have provided her with three samples of Ensure/Boost in plastic bottles to see if that is more palatable.  She reports a strong appetite, but just difficulty with swallowing secondary to XRT that has improved over the past week.   Continued PO H2O intake is encouraged.  Return next week for cycle #1 of consolidative  chemotherapy.  Return in 2 weeks for NADIR/tolerance check and again in 4 weeks for follow-up and cycle #2 of consolidative chemotherapy.    ORDERS PLACED FOR THIS  ENCOUNTER: No orders of the defined types were placed in this encounter.   MEDICATIONS PRESCRIBED THIS ENCOUNTER: No orders of the defined types were placed in this encounter.   THERAPY PLAN:  Completed concurrent chemoradiation and now beginning 2 cycles of consolidative q3weekly carbo-taxol with G-CSF next week.   All questions were answered. The patient knows to call the clinic with any problems, questions or concerns. We can certainly see the patient much sooner if necessary.  Patient and plan discussed with Dr. Ancil Linsey and she is in agreement with the aforementioned.   This note is electronically signed by: Robynn Pane, PA-C 11/08/2016 9:08 AM

## 2016-11-08 NOTE — Patient Instructions (Addendum)
[place] at [facility] Discharge Instructions  RECOMMENDATIONS MADE BY THE CONSULTANT AND ANY TEST RESULTS WILL BE SENT TO YOUR REFERRING PHYSICIAN.  You saw Kirby Crigler, PA-C, today. Treatment and labs as planned next week. Follow up in 4 weeks with Cycle 2 of treatment. See Amy at checkout for appointments.  Thank you for choosing [place] at [facility] to provide your oncology and hematology care.  To afford each patient quality time with our provider, please arrive at least 15 minutes before your scheduled appointment time.   Beginning January 23rd 2017 lab work for the [facility] will be done in the  Main lab at [facility] on 1st floor. If you have a lab appointment with the [facility] please come in thru the  Main Entrance and check in at the main information desk  You need to re-schedule your appointment should you arrive 10 or more minutes late.  We strive to give you quality time with our providers, and arriving late affects you and other patients whose appointments are after yours.  Also, if you no show three or more times for appointments you may be dismissed from the clinic at the providers discretion.     Again, thank you for choosing [facility].  Our hope is that these requests will decrease the amount of time that you wait before being seen by our physicians.       _____________________________________________________________  Should you have questions after your visit to [facility], please contact our office at [phone] between the hours of 8:30 a.m. and 4:30 p.m.  Voicemails left after 4:30 p.m. will not be returned until the following business day.  For prescription refill requests, have your pharmacy contact our office.         Resources For Cancer Patients and their Caregivers ? American Cancer Society: Can assist with transportation, wigs, general needs, runs Look Good Feel  Better.        9066848952 ? Cancer Care: Provides financial assistance, online support groups, medication/co-pay assistance.  1-800-813-HOPE 416-103-1960) ? [facility] [place] Assists [place] cancer patients and their families through emotional , educational and financial support.  516-435-8700 ? [place] DSS Where to apply for food stamps, Medicaid and utility assistance. 657-324-3153 ? [facility]: Transportation to medical appointments. 585-291-4776 ? Social Security Administration: May apply for disability if have a Stage IV cancer. 276-102-6085 (478)231-9333 ? [employer], Disability and Transit Services: Assists with nutrition, care and transit needs. [place] Support Programs: '@10RELATIVEDAYS'$ @ > Cancer Support Group  2nd Tuesday of the month 1pm-2pm, Journey Room  > Creative Journey  3rd Tuesday of the month 1130am-1pm, Journey Room  > Look Good Feel Better  1st Wednesday of the month 10am-12 noon, Journey Room (Call [place] to register [phone])

## 2016-11-08 NOTE — Assessment & Plan Note (Addendum)
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  Oncology history updated.  Pre-treatment labs next week: CBC diff, CMET.  I personally reviewed and went over laboratory results with the patient.  The results are noted within this dictation.    Weight loss is noted.  Burtis Junes, RD is following the patient.  She has received free case of Ensure CANS, but she does not tolerate this as it has a metallic taste.  I have provided her with three samples of Ensure/Boost in plastic bottles to see if that is more palatable.  She reports a strong appetite, but just difficulty with swallowing secondary to XRT that has improved over the past week.   Continued PO H2O intake is encouraged.  Return next week for cycle #1 of consolidative chemotherapy.  Return in 2 weeks for NADIR/tolerance check and again in 4 weeks for follow-up and cycle #2 of consolidative chemotherapy.

## 2016-11-15 ENCOUNTER — Encounter (HOSPITAL_BASED_OUTPATIENT_CLINIC_OR_DEPARTMENT_OTHER): Payer: Medicaid Other

## 2016-11-15 ENCOUNTER — Encounter (HOSPITAL_COMMUNITY): Payer: Self-pay | Admitting: Emergency Medicine

## 2016-11-15 VITALS — BP 130/76 | HR 92 | Temp 98.1°F | Resp 18 | Wt 111.4 lb

## 2016-11-15 DIAGNOSIS — Z5111 Encounter for antineoplastic chemotherapy: Secondary | ICD-10-CM

## 2016-11-15 DIAGNOSIS — C3411 Malignant neoplasm of upper lobe, right bronchus or lung: Secondary | ICD-10-CM

## 2016-11-15 DIAGNOSIS — Z5189 Encounter for other specified aftercare: Secondary | ICD-10-CM

## 2016-11-15 DIAGNOSIS — Z9221 Personal history of antineoplastic chemotherapy: Secondary | ICD-10-CM | POA: Diagnosis not present

## 2016-11-15 LAB — COMPREHENSIVE METABOLIC PANEL
ALBUMIN: 3.9 g/dL (ref 3.5–5.0)
ALK PHOS: 96 U/L (ref 38–126)
ALT: 10 U/L — AB (ref 14–54)
AST: 13 U/L — ABNORMAL LOW (ref 15–41)
Anion gap: 8 (ref 5–15)
BUN: 12 mg/dL (ref 6–20)
CALCIUM: 9.4 mg/dL (ref 8.9–10.3)
CO2: 27 mmol/L (ref 22–32)
CREATININE: 0.83 mg/dL (ref 0.44–1.00)
Chloride: 102 mmol/L (ref 101–111)
GFR calc non Af Amer: >60 mL/min (ref >60)
GLUCOSE: 88 mg/dL (ref 65–99)
Potassium: 4 mmol/L (ref 3.5–5.1)
SODIUM: 137 mmol/L (ref 135–145)
Total Bilirubin: 0.3 mg/dL (ref 0.3–1.2)
Total Protein: 7.5 g/dL (ref 6.5–8.1)

## 2016-11-15 LAB — CBC WITH DIFFERENTIAL/PLATELET
Basophils Absolute: 0 K/uL (ref 0.0–0.1)
Basophils Relative: 1 %
Eosinophils Absolute: 0.1 K/uL (ref 0.0–0.7)
Eosinophils Relative: 2 %
HCT: 33.5 % — ABNORMAL LOW (ref 36.0–46.0)
Hemoglobin: 11.4 g/dL — ABNORMAL LOW (ref 12.0–15.0)
Lymphocytes Relative: 15 %
Lymphs Abs: 0.7 K/uL (ref 0.7–4.0)
MCH: 33.3 pg (ref 26.0–34.0)
MCHC: 34 g/dL (ref 30.0–36.0)
MCV: 98 fL (ref 78.0–100.0)
Monocytes Absolute: 0.6 K/uL (ref 0.1–1.0)
Monocytes Relative: 12 %
Neutro Abs: 3.1 K/uL (ref 1.7–7.7)
Neutrophils Relative %: 70 %
Platelets: 345 K/uL (ref 150–400)
RBC: 3.42 MIL/uL — ABNORMAL LOW (ref 3.87–5.11)
RDW: 17.1 % — ABNORMAL HIGH (ref 11.5–15.5)
WBC: 4.5 K/uL (ref 4.0–10.5)

## 2016-11-15 MED ORDER — SODIUM CHLORIDE 0.9 % IV SOLN
Freq: Once | INTRAVENOUS | Status: AC
  Administered 2016-11-15: 11:00:00 via INTRAVENOUS

## 2016-11-15 MED ORDER — FAMOTIDINE IN NACL 20-0.9 MG/50ML-% IV SOLN
20.0000 mg | Freq: Once | INTRAVENOUS | Status: AC
  Administered 2016-11-15: 20 mg via INTRAVENOUS
  Filled 2016-11-15: qty 50

## 2016-11-15 MED ORDER — DIPHENHYDRAMINE HCL 50 MG/ML IJ SOLN
50.0000 mg | Freq: Once | INTRAMUSCULAR | Status: AC
  Administered 2016-11-15: 50 mg via INTRAVENOUS
  Filled 2016-11-15: qty 1

## 2016-11-15 MED ORDER — PALONOSETRON HCL INJECTION 0.25 MG/5ML
0.2500 mg | Freq: Once | INTRAVENOUS | Status: AC
  Administered 2016-11-15: 0.25 mg via INTRAVENOUS
  Filled 2016-11-15: qty 5

## 2016-11-15 MED ORDER — HEPARIN SOD (PORK) LOCK FLUSH 100 UNIT/ML IV SOLN
500.0000 [IU] | Freq: Once | INTRAVENOUS | Status: AC | PRN
  Administered 2016-11-15: 500 [IU]
  Filled 2016-11-15: qty 5

## 2016-11-15 MED ORDER — SODIUM CHLORIDE 0.9% FLUSH
10.0000 mL | INTRAVENOUS | Status: DC | PRN
  Administered 2016-11-15: 10 mL
  Filled 2016-11-15: qty 10

## 2016-11-15 MED ORDER — SODIUM CHLORIDE 0.9 % IV SOLN
20.0000 mg | Freq: Once | INTRAVENOUS | Status: AC
  Administered 2016-11-15: 20 mg via INTRAVENOUS
  Filled 2016-11-15: qty 2

## 2016-11-15 MED ORDER — PACLITAXEL CHEMO INJECTION 300 MG/50ML
200.0000 mg/m2 | Freq: Once | INTRAVENOUS | Status: AC
  Administered 2016-11-15: 300 mg via INTRAVENOUS
  Filled 2016-11-15: qty 50

## 2016-11-15 MED ORDER — CARBOPLATIN CHEMO INJECTION 600 MG/60ML
513.6000 mg | Freq: Once | INTRAVENOUS | Status: AC
  Administered 2016-11-15: 510 mg via INTRAVENOUS
  Filled 2016-11-15: qty 51

## 2016-11-15 MED ORDER — PEGFILGRASTIM 6 MG/0.6ML ~~LOC~~ PSKT
6.0000 mg | PREFILLED_SYRINGE | Freq: Once | SUBCUTANEOUS | Status: AC
  Administered 2016-11-15: 6 mg via SUBCUTANEOUS
  Filled 2016-11-15: qty 0.6

## 2016-11-15 NOTE — Patient Instructions (Signed)
[facility] Discharge Instructions for Patients Receiving Chemotherapy   Beginning January 23rd 2017 lab work for the Cancer Center will be done in the  Main lab at [facility] on 1st floor. If you have a lab appointment with the Cancer Center please come in thru the  Main Entrance and check in at the main information desk   Today you received the following chemotherapy agents Taxol and Carboplatin. Follow-up as scheduled. Call clinic for any questions or concerns  To help prevent nausea and vomiting after your treatment, we encourage you to take your nausea medication.   If you develop nausea and vomiting, or diarrhea that is not controlled by your medication, call the clinic.  The clinic phone number is [phone]. Office hours are Monday-Friday 8:30am-5:00pm.  BELOW ARE SYMPTOMS THAT SHOULD BE REPORTED IMMEDIATELY:  *FEVER GREATER THAN 101.0 F  *CHILLS WITH OR WITHOUT FEVER  NAUSEA AND VOMITING THAT IS NOT CONTROLLED WITH YOUR NAUSEA MEDICATION  *UNUSUAL SHORTNESS OF BREATH  *UNUSUAL BRUISING OR BLEEDING  TENDERNESS IN MOUTH AND THROAT WITH OR WITHOUT PRESENCE OF ULCERS  *URINARY PROBLEMS  *BOWEL PROBLEMS  UNUSUAL RASH Items with * indicate a potential emergency and should be followed up as soon as possible. If you have an emergency after office hours please contact your primary care physician or go to the nearest emergency department.  Please call the clinic during office hours if you have any questions or concerns.   You may also contact the Patient Navigator at [phone] should you have any questions or need assistance in obtaining follow up care.      Resources For Cancer Patients and their Caregivers ? American Cancer Society: Can assist with transportation, wigs, general needs, runs Look Good Feel Better.        1-888-227-6333 ? Cancer Care: Provides financial assistance, online support groups, medication/co-pay assistance.   1-800-813-HOPE (4673) ? [facility] Assists [place] cancer patients and their families through emotional , educational and financial support.  336-427-4357 ? [place] DSS Where to apply for food stamps, Medicaid and utility assistance. 336-342-1394 ? [employer]: Transportation to medical appointments. 336-347-2287 ? Social Security Administration: May apply for disability if have a Stage IV cancer. 336-342-7796 1-800-772-1213 ? [place] Aging, Disability and Transit Services: Assists with nutrition, care and transit needs. 336-349-2343         

## 2016-11-15 NOTE — Progress Notes (Signed)
Emily Pope tolerated chemo tx well without complaints. Labs reviewed with MD prior to administering chemotherapy.Pt instructed in purpose,side effects and instructions for Neulasta on-pro and verbalized understanding. Neulasta applied to left arm and green light indicator flashing. VSS upon discharge. Pt discharged self ambulatory in satisfactory condition with friend

## 2016-11-15 NOTE — Progress Notes (Signed)
Went to see pt during treatment today.  States that she is doing well.  That she is so thankful for everyone here.  I also took her to the journey room after her treatment was completed and let her pick out a wig with her husband.  They were both very thankful.

## 2016-11-23 ENCOUNTER — Encounter (HOSPITAL_BASED_OUTPATIENT_CLINIC_OR_DEPARTMENT_OTHER): Payer: Medicaid Other | Admitting: Oncology

## 2016-11-23 ENCOUNTER — Other Ambulatory Visit (HOSPITAL_COMMUNITY): Payer: Self-pay

## 2016-11-23 ENCOUNTER — Encounter (HOSPITAL_COMMUNITY): Payer: Self-pay | Admitting: Oncology

## 2016-11-23 ENCOUNTER — Encounter (HOSPITAL_COMMUNITY): Payer: Medicaid Other

## 2016-11-23 DIAGNOSIS — G893 Neoplasm related pain (acute) (chronic): Secondary | ICD-10-CM

## 2016-11-23 DIAGNOSIS — R530 Neoplastic (malignant) related fatigue: Secondary | ICD-10-CM

## 2016-11-23 DIAGNOSIS — C3411 Malignant neoplasm of upper lobe, right bronchus or lung: Secondary | ICD-10-CM

## 2016-11-23 LAB — CBC WITH DIFFERENTIAL/PLATELET
Basophils Absolute: 0 10*3/uL (ref 0.0–0.1)
Basophils Relative: 0 %
EOS ABS: 0.2 10*3/uL (ref 0.0–0.7)
Eosinophils Relative: 1 %
HCT: 27.9 % — ABNORMAL LOW (ref 36.0–46.0)
Hemoglobin: 9.6 g/dL — ABNORMAL LOW (ref 12.0–15.0)
Lymphocytes Relative: 17 %
Lymphs Abs: 3.3 10*3/uL (ref 0.7–4.0)
MCH: 33.6 pg (ref 26.0–34.0)
MCHC: 34.4 g/dL (ref 30.0–36.0)
MCV: 97.6 fL (ref 78.0–100.0)
MONO ABS: 1.6 10*3/uL — AB (ref 0.1–1.0)
Monocytes Relative: 8 %
NEUTROS PCT: 74 %
Neutro Abs: 14.4 10*3/uL — ABNORMAL HIGH (ref 1.7–7.7)
PLATELETS: 228 10*3/uL (ref 150–400)
RBC: 2.86 MIL/uL — AB (ref 3.87–5.11)
RDW: 16.4 % — ABNORMAL HIGH (ref 11.5–15.5)
WBC MORPHOLOGY: INCREASED
WBC: 19.5 10*3/uL — AB (ref 4.0–10.5)

## 2016-11-23 LAB — COMPREHENSIVE METABOLIC PANEL
ALT: 12 U/L — AB (ref 14–54)
AST: 19 U/L (ref 15–41)
Albumin: 3.8 g/dL (ref 3.5–5.0)
Alkaline Phosphatase: 116 U/L (ref 38–126)
Anion gap: 8 (ref 5–15)
BUN: 10 mg/dL (ref 6–20)
CHLORIDE: 100 mmol/L — AB (ref 101–111)
CO2: 27 mmol/L (ref 22–32)
CREATININE: 0.71 mg/dL (ref 0.44–1.00)
Calcium: 9 mg/dL (ref 8.9–10.3)
GFR calc non Af Amer: >60 mL/min (ref >60)
Glucose, Bld: 91 mg/dL (ref 65–99)
Potassium: 3.6 mmol/L (ref 3.5–5.1)
SODIUM: 135 mmol/L (ref 135–145)
Total Bilirubin: <0.1 mg/dL — ABNORMAL LOW (ref 0.3–1.2)
Total Protein: 6.6 g/dL (ref 6.5–8.1)

## 2016-11-23 NOTE — Progress Notes (Signed)
Emily Chroman, MD [place] [address]  Primary cancer of right upper lobe of lung [facility])  CURRENT THERAPY: Consolidative chemotherapy with Carboplatin/Paclitaxel following concurrent chemoXRT.  INTERVAL HISTORY: Emily Pope 60 y.o. 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  Now on consolidative chemotherapy with Carboplatin/Paclitaxel following concurrent chemoXRT.    Primary cancer of right upper lobe of lung ([place])   08/04/2016 PET scan    PET IMPRESSION: 1. 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 Mass is confluent with the right superior hilum and right lower tracheal wall, suggestive of a T4 primary tumor. 2. Postobstructive pneumonia in the right upper lobe. 3. Hypermetabolic ipsilateral and contralateral mediastinal and contralateral hilar lymphadenopathy, suggestive of N3 nodal disease.      08/24/2016 Procedure    Bronchoscopy      08/25/2016 Pathology Results    Diagnosis Endobronchial biopsy, RUL - POORLY DIFFERENTIATED NON-SMALL CELL CARCINOMA. The immunophenotype is consistent with poorly differentiated adenocarcinoma.      09/10/2016 Imaging    MRI brain- No metastatic disease or acute intracranial abnormality.      09/20/2016 - 10/25/2016 Chemotherapy    The patient had palonosetron (ALOXI) injection 0.25 mg, 0.25 mg, Intravenous,  Once, 6 of 6 cycles  CARBOplatin (PARAPLATIN) 180 mg in sodium chloride 0.9 % 100 mL chemo infusion, 180 mg (99.1 % of original dose 178.2 mg), Intravenous,  Once, 6 of 6 cycles Dose modification:   (original dose 178.2 mg, Cycle 1)  PACLitaxel (TAXOL) 66 mg in dextrose 5 % 250 mL chemo infusion ( palonosetron (ALOXI) injection 0.25 mg, 0.25 mg, Intravenous,   Once, 0 of 2 cycles  pegfilgrastim (NEULASTA) injection 6 mg, 6 mg, Subcutaneous,  Once, 0 of 2 cycles  pegfilgrastim (NEULASTA ONPRO KIT) injection 6 mg, 6 mg, Subcutaneous, Once, 0 of 2 cycles  CARBOplatin (PARAPLATIN) in sodium chloride 0.9 % 100 mL chemo infusion, , Intravenous,  Once, 0 of 2 cycles  PACLitaxel (TAXOL) 300 mg in dextrose 5 % 250 mL chemo infusion (> 52m/m2), 200 mg/m2 = 300 mg, Intravenous,  Once, 0 of 2 cycles  for chemotherapy treatment.        11/15/2016 -  Chemotherapy    The patient had palonosetron (ALOXI) injection 0.25 mg, 0.25 mg, Intravenous,  Once, 6 of 6 cycles  CARBOplatin (PARAPLATIN) 180 mg in sodium chloride 0.9 % 100 mL chemo infusion, 180 mg (99.1 % of original dose 178.2 mg), Intravenous,  Once, 6 of 6 cycles Dose modification:   (original dose 178.2 mg, Cycle 1)  PACLitaxel (TAXOL) 66 mg in dextrose 5 % 250 mL chemo infusion ( palonosetron (ALOXI) injection 0.25 mg, 0.25 mg, Intravenous,  Once, 1 of 2 cycles  pegfilgrastim (NEULASTA ONPRO KIT) injection 6 mg, 6 mg, Subcutaneous, Once, 1 of 2 cycles  CARBOplatin (PARAPLATIN) 510 mg in sodium chloride 0.9 % 250 mL chemo infusion, 510 mg (100 % of original dose 513.6 mg), Intravenous,  Once, 1 of 2 cycles Dose modification:   (original dose 513.6 mg, Cycle 1)  PACLitaxel (TAXOL) 300 mg in dextrose 5 % 250 mL chemo infusion (> 874mm2), 200 mg/m2 = 300 mg, Intravenous,  Once, 1 of 2 cycles  for chemotherapy treatment.        She is here today for a NADIR check.  She notes muscle aches post Neulasta treatment.  She reports an increase in back pain as a result of Neulasta.  She reports a 4 day history of spending most of the time in bed.  She reports a poor oral intake during these 4 days.  She presented to [facility] ED on [facility]/21/[facility].  She was diagnosed with a UTI and hypotension.  She was discharged the same day with Cipro after giving her IV fluids and pain medication.  Her weight is  down another 1 lbs since treatment.  NOW, she notes an improvement in her nausea.  She denies any emesis.  Review of Systems  Constitutional: Positive for malaise/fatigue and weight loss. Negative for chills and fever.  HENT: Negative.   Eyes: Negative.  Negative for double vision.  Respiratory: Negative.  Negative for cough.   Cardiovascular: Negative.  Negative for chest pain.  Gastrointestinal: Positive for nausea. Negative for constipation, diarrhea and vomiting.  Genitourinary: Negative.   Musculoskeletal: Negative.   Skin: Negative.  Negative for rash.  Neurological: Negative.  Negative for weakness.  Endo/Heme/Allergies: Negative.   Psychiatric/Behavioral: Negative.     Past Medical History:  Diagnosis Date  . Anxiety    uses it for menopause   . Arthritis   . Cancer ([place])    Right Lung  . Chronic bronchitis ([place])   . COPD (chronic obstructive pulmonary disease) ([place])   . GERD (gastroesophageal reflux disease)   . Headache    sinus  . Hypertension   . Pneumonia    07/22/16-had pneumonia in right lung and found mass in lung  . Primary cancer of right upper lobe of lung ([place]) 08/23/2016    Past Surgical History:  Procedure Laterality Date  . ABDOMINAL HYSTERECTOMY    . APPENDECTOMY     when had hysterectomy  . ENDOBRONCHIAL ULTRASOUND Bilateral 08/23/2016   Procedure: ENDOBRONCHIAL ULTRASOUND;  Surgeon: Collene Gobble, MD;  Location: [facility];  Service: Cardiopulmonary;  Laterality: Bilateral;  . HERNIA REPAIR     left inguinal hernia age 47  . PORTACATH PLACEMENT Left 09/17/2016   Procedure: INSERTION PORT-A-CATH LEFT SUBCLAVIAN;  Surgeon: Aviva Signs, MD;  Location: [facility];  Service: General;  Laterality: Left;  . TUBAL LIGATION      Family History  Problem Relation Age of Onset  . Cancer Mother     lung  . Hypertension Mother   . Hypertension Father   . Diabetes Father   . Hypertension Sister   . Cancer Brother     lung  . Hypertension Sister      Social History   Social History  . Marital status: Divorced    Spouse name: N/A  . Number of children: N/A  . Years of education: N/A   Social History Main Topics  . Smoking status: Former Smoker    Packs/day: 2.00    Years: 43.00    Quit date: 07/20/2016  . Smokeless tobacco: Never Used  . Alcohol use No  . Drug use: No  . Sexual activity: Not Asked   Other Topics Concern  . None   Social History Narrative  . None     PHYSICAL EXAMINATION  ECOG PERFORMANCE STATUS: 1 - Symptomatic but completely ambulatory  Vitals:   11/23/16 1000  BP: 135/79  Pulse: 94  Resp: 16  Temp: 97.9 F (36.6 C)    Vitals - 1 value per visit 96/75/9163  SYSTOLIC 846  DIASTOLIC 76  Pulse 92  Temperature 98.1  Respirations  18  Weight (lb) 111.4    GENERAL:alert, no distress, comfortable, cooperative, smiling and accompanied by husband who wants to talk about watches rather than the patient's cancer care. SKIN: skin color, texture, turgor are normal, no rashes or significant lesions HEAD: Normocephalic, No masses, lesions, tenderness or abnormalities EYES: normal, EOMI, Conjunctiva are pink and non-injected EARS: External ears normal OROPHARYNX:lips, buccal mucosa, and tongue normal and mucous membranes are moist  NECK: supple, trachea midline LYMPH:  no palpable lymphadenopathy BREAST:not examined LUNGS: clear to auscultation , decreased breath sounds bilaterally. HEART: regular rate & rhythm ABDOMEN:abdomen soft BACK: normal ROM EXTREMITIES:less then 2 second capillary refill, no joint deformities, effusion, or inflammation, no skin discoloration, no cyanosis  NEURO: alert & oriented x 3 with fluent speech, no focal motor/sensory deficits, gait normal   LABORATORY DATA: CBC    Component Value Date/Time   WBC 19.5 (H) 11/23/2016 0818   RBC 2.86 (L) 11/23/2016 0818   HGB 9.6 (L) 11/23/2016 0818   HGB 12.9 09/02/2016 1403   HCT 27.9 (L) 11/23/2016 0818   HCT 38.4  09/02/2016 1403   PLT 228 11/23/2016 0818   PLT 476 (H) 09/02/2016 1403   MCV 97.6 11/23/2016 0818   MCV 94.6 09/02/2016 1403   MCH 33.6 11/23/2016 0818   MCHC 34.4 11/23/2016 0818   RDW 16.4 (H) 11/23/2016 0818   RDW 13.6 09/02/2016 1403   LYMPHSABS 3.3 11/23/2016 0818   LYMPHSABS 2.5 09/02/2016 1403   MONOABS 1.6 (H) 11/23/2016 0818   MONOABS 0.7 09/02/2016 1403   EOSABS 0.2 11/23/2016 0818   EOSABS 0.7 (H) 09/02/2016 1403   BASOSABS 0.0 11/23/2016 0818   BASOSABS 0.1 09/02/2016 1403      Chemistry      Component Value Date/Time   NA 135 11/23/2016 0818   NA 140 09/02/2016 1403   K 3.6 11/23/2016 0818   K 4.2 09/02/2016 1403   CL 100 (L) 11/23/2016 0818   CO2 27 11/23/2016 0818   CO2 27 09/02/2016 1403   BUN 10 11/23/2016 0818   BUN 15.6 09/02/2016 1403   CREATININE 0.71 11/23/2016 0818   CREATININE 0.8 09/02/2016 1403      Component Value Date/Time   CALCIUM 9.0 11/23/2016 0818   CALCIUM 9.6 09/02/2016 1403   ALKPHOS 116 11/23/2016 0818   ALKPHOS 159 (H) 09/02/2016 1403   AST 19 11/23/2016 0818   AST 12 09/02/2016 1403   ALT 12 (L) 11/23/2016 0818   ALT <9 09/02/2016 1403   BILITOT <0.1 (L) 11/23/2016 0818   BILITOT <0.30 09/02/2016 1403        PENDING LABS:   RADIOGRAPHIC STUDIES:  No results found.   PATHOLOGY:    ASSESSMENT AND PLAN:  Primary cancer of right upper lobe of lung ([place]) Stage IIIB (T4N3M0) poorly differentiated carcinoma of lung with immunophenotyping consistent with adenocarcinoma.  Now on consolidative chemotherapy with Carboplatin/Paclitaxel following concurrent chemoXRT.  Oncology history updated.  [place] labs today: CBC diff, CMET.  I personally reviewed and went over laboratory results with the patient.  The results are noted within this dictation.    She reports an ED visit on 11/21/2016 at [facility] with dehydration and UTI.  She was treated with Cipro and IV fluids.  She notes a significant improvement following  IV fluids.  She reports nausea that is well controlled with Compazine.  She denies any emesis.  She is using Compazine instead of Zofran due to cost of Zofran.    She notes  significant fatigue/tiredness/bone pain for 3-4 days post treatment leading to spending a majority of time in bed and decreased oral intake.  This, for sure, contributed to her ED visit.    We may need to consider Neupogen with her next cycle daily, however, the patient's husband reports possible transportation issues.  Return as scheduled for follow-up on 12/112017 with cycle #2.  I have added a scheduled appointment for IV fluids on 12/10/2016 following treatment.  At this appointment, we will consider Neulasta versus Neupogen.   ORDERS PLACED FOR THIS ENCOUNTER: No orders of the defined types were placed in this encounter.   MEDICATIONS PRESCRIBED THIS ENCOUNTER: No orders of the defined types were placed in this encounter.   THERAPY PLAN:  Completed concurrent chemoradiation and now on consolidative chemotherapy consisting of 2 cycles of q3weekly carbo-taxol with G-CSF support.  All questions were answered. The patient knows to call the clinic with any problems, questions or concerns. We can certainly see the patient much sooner if necessary.  Patient and plan discussed with Dr. Ancil Linsey and she is in agreement with the aforementioned.   This note is electronically signed by: Doy Mince 11/23/2016 4:11 PM

## 2016-11-23 NOTE — Assessment & Plan Note (Addendum)
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  Now on consolidative chemotherapy with Carboplatin/Paclitaxel following concurrent chemoXRT.  Oncology history updated.  [place] labs today: CBC diff, CMET.  I personally reviewed and went over laboratory results with the patient.  The results are noted within this dictation.    She reports an ED visit on 11/21/2016 at [facility] with dehydration and UTI.  She was treated with Cipro and IV fluids.  She notes a significant improvement following IV fluids.  She reports nausea that is well controlled with Compazine.  She denies any emesis.  She is using Compazine instead of Zofran due to cost of Zofran.    She notes significant fatigue/tiredness/bone pain for 3-4 days post treatment leading to spending a majority of time in bed and decreased oral intake.  This, for sure, contributed to her ED visit.    We may need to consider Neupogen with her next cycle daily, however, the patient's husband reports possible transportation issues.  Return as scheduled for follow-up on 12/112017 with cycle #2.  I have added a scheduled appointment for IV fluids on 12/10/2016 following treatment.  At this appointment, we will consider Neulasta versus Neupogen.

## 2016-11-23 NOTE — Patient Instructions (Signed)
[place] at [facility] Discharge Instructions  RECOMMENDATIONS MADE BY THE CONSULTANT AND ANY TEST RESULTS WILL BE SENT TO YOUR REFERRING PHYSICIAN.  You were seen today by Kirby Crigler PA-C.  Return as scheduled.    Thank you for choosing [place] at [facility] to provide your oncology and hematology care.  To afford each patient quality time with our provider, please arrive at least 15 minutes before your scheduled appointment time.   Beginning January 23rd 2017 lab work for the [facility] will be done in the  Main lab at [facility] on 1st floor. If you have a lab appointment with the [facility] please come in thru the  Main Entrance and check in at the main information desk  You need to re-schedule your appointment should you arrive 10 or more minutes late.  We strive to give you quality time with our providers, and arriving late affects you and other patients whose appointments are after yours.  Also, if you no show three or more times for appointments you may be dismissed from the clinic at the providers discretion.     Again, thank you for choosing [facility].  Our hope is that these requests will decrease the amount of time that you wait before being seen by our physicians.       _____________________________________________________________  Should you have questions after your visit to [facility], please contact our office at [phone] between the hours of 8:30 a.m. and 4:30 p.m.  Voicemails left after 4:30 p.m. will not be returned until the following business day.  For prescription refill requests, have your pharmacy contact our office.         Resources For Cancer Patients and their Caregivers ? American Cancer Society: Can assist with transportation, wigs, general needs, runs Look Good Feel Better.        ([phone] ? Cancer Care: Provides financial assistance, online support  groups, medication/co-pay assistance.  1-800-813-HOPE (906)454-9342) ? [place] Assists [place] cancer patients and their families through emotional , educational and financial support.  (320) 168-2017 ? [place] DSS Where to apply for food stamps, Medicaid and utility assistance. 325-466-1881 ? RCATS: Transportation to medical appointments. (567) 178-0432 ? Social Security Administration: May apply for disability if have a Stage IV cancer. 630-028-0634 708-380-4241 ? [employer], Disability and Transit Services: Assists with nutrition, care and transit needs. [place] Support Programs: '@10RELATIVEDAYS'$ @ > Cancer Support Group  2nd Tuesday of the month 1pm-2pm, Journey Room  > Creative Journey  3rd Tuesday of the month 1130am-1pm, Journey Room  > Look Good Feel Better  1st Wednesday of the month 10am-12 noon, Journey Room (Call [place] to register [phone])

## 2016-11-25 ENCOUNTER — Ambulatory Visit: Payer: Self-pay | Admitting: Emergency Medicine

## 2016-11-26 ENCOUNTER — Ambulatory Visit (HOSPITAL_COMMUNITY): Payer: Medicaid Other

## 2016-12-06 ENCOUNTER — Ambulatory Visit (HOSPITAL_COMMUNITY): Payer: Self-pay | Admitting: Hematology & Oncology

## 2016-12-06 ENCOUNTER — Encounter (HOSPITAL_COMMUNITY): Payer: Medicaid Other | Attending: Hematology & Oncology

## 2016-12-06 ENCOUNTER — Other Ambulatory Visit (HOSPITAL_COMMUNITY): Payer: Self-pay | Admitting: Oncology

## 2016-12-06 ENCOUNTER — Encounter (HOSPITAL_COMMUNITY): Payer: Self-pay

## 2016-12-06 VITALS — BP 109/74 | HR 98 | Temp 97.9°F | Resp 18

## 2016-12-06 DIAGNOSIS — Z5189 Encounter for other specified aftercare: Secondary | ICD-10-CM

## 2016-12-06 DIAGNOSIS — R309 Painful micturition, unspecified: Secondary | ICD-10-CM

## 2016-12-06 DIAGNOSIS — J00 Acute nasopharyngitis [common cold]: Secondary | ICD-10-CM

## 2016-12-06 DIAGNOSIS — Z9221 Personal history of antineoplastic chemotherapy: Secondary | ICD-10-CM | POA: Insufficient documentation

## 2016-12-06 DIAGNOSIS — Z5111 Encounter for antineoplastic chemotherapy: Secondary | ICD-10-CM | POA: Diagnosis present

## 2016-12-06 DIAGNOSIS — C3411 Malignant neoplasm of upper lobe, right bronchus or lung: Secondary | ICD-10-CM | POA: Diagnosis present

## 2016-12-06 DIAGNOSIS — R093 Abnormal sputum: Secondary | ICD-10-CM

## 2016-12-06 LAB — CBC WITH DIFFERENTIAL/PLATELET
Basophils Absolute: 0 10*3/uL (ref 0.0–0.1)
Basophils Relative: 1 %
EOS ABS: 0 10*3/uL (ref 0.0–0.7)
Eosinophils Relative: 1 %
HEMATOCRIT: 27.8 % — AB (ref 36.0–46.0)
HEMOGLOBIN: 9.5 g/dL — AB (ref 12.0–15.0)
LYMPHS ABS: 0.4 10*3/uL — AB (ref 0.7–4.0)
Lymphocytes Relative: 11 %
MCH: 34.4 pg — AB (ref 26.0–34.0)
MCHC: 34.2 g/dL (ref 30.0–36.0)
MCV: 100.7 fL — ABNORMAL HIGH (ref 78.0–100.0)
MONOS PCT: 15 %
Monocytes Absolute: 0.6 10*3/uL (ref 0.1–1.0)
NEUTROS ABS: 2.8 10*3/uL (ref 1.7–7.7)
NEUTROS PCT: 72 %
Platelets: 298 10*3/uL (ref 150–400)
RBC: 2.76 MIL/uL — AB (ref 3.87–5.11)
RDW: 17 % — ABNORMAL HIGH (ref 11.5–15.5)
WBC: 3.9 10*3/uL — AB (ref 4.0–10.5)

## 2016-12-06 LAB — COMPREHENSIVE METABOLIC PANEL
ALBUMIN: 3.9 g/dL (ref 3.5–5.0)
ALK PHOS: 105 U/L (ref 38–126)
ALT: 14 U/L (ref 14–54)
ANION GAP: 7 (ref 5–15)
AST: 14 U/L — ABNORMAL LOW (ref 15–41)
BILIRUBIN TOTAL: 0.5 mg/dL (ref 0.3–1.2)
BUN: 18 mg/dL (ref 6–20)
CALCIUM: 9.2 mg/dL (ref 8.9–10.3)
CO2: 26 mmol/L (ref 22–32)
CREATININE: 0.6 mg/dL (ref 0.44–1.00)
Chloride: 101 mmol/L (ref 101–111)
GFR calc non Af Amer: >60 mL/min (ref >60)
GLUCOSE: 78 mg/dL (ref 65–99)
Potassium: 4 mmol/L (ref 3.5–5.1)
SODIUM: 134 mmol/L — AB (ref 135–145)
TOTAL PROTEIN: 7.4 g/dL (ref 6.5–8.1)

## 2016-12-06 LAB — URINALYSIS, MICROSCOPIC (REFLEX): BACTERIA UA: NONE SEEN

## 2016-12-06 LAB — URINALYSIS, ROUTINE W REFLEX MICROSCOPIC
Bilirubin Urine: NEGATIVE
GLUCOSE, UA: NEGATIVE mg/dL
Ketones, ur: NEGATIVE mg/dL
Nitrite: NEGATIVE
PH: 5.5 (ref 5.0–8.0)
Protein, ur: NEGATIVE mg/dL
Specific Gravity, Urine: <1.005 — ABNORMAL LOW (ref 1.005–1.030)

## 2016-12-06 MED ORDER — PALONOSETRON HCL INJECTION 0.25 MG/5ML
INTRAVENOUS | Status: AC
  Filled 2016-12-06: qty 5

## 2016-12-06 MED ORDER — SODIUM CHLORIDE 0.9 % IV SOLN
Freq: Once | INTRAVENOUS | Status: AC
  Administered 2016-12-06: 11:00:00 via INTRAVENOUS

## 2016-12-06 MED ORDER — SODIUM CHLORIDE 0.9% FLUSH: 10.0000 mL | INTRAVENOUS | Status: DC | PRN

## 2016-12-06 MED ORDER — DIPHENHYDRAMINE HCL 50 MG/ML IJ SOLN
INTRAMUSCULAR | Status: AC
  Filled 2016-12-06: qty 1

## 2016-12-06 MED ORDER — SODIUM CHLORIDE 0.9 % IV SOLN
20.0000 mg | Freq: Once | INTRAVENOUS | Status: AC
  Administered 2016-12-06: 20 mg via INTRAVENOUS
  Filled 2016-12-06: qty 2

## 2016-12-06 MED ORDER — DIPHENHYDRAMINE HCL 50 MG/ML IJ SOLN
50.0000 mg | Freq: Once | INTRAMUSCULAR | Status: AC
  Administered 2016-12-06: 50 mg via INTRAVENOUS

## 2016-12-06 MED ORDER — HEPARIN SOD (PORK) LOCK FLUSH 100 UNIT/ML IV SOLN
500.0000 [IU] | Freq: Once | INTRAVENOUS | Status: AC | PRN
  Administered 2016-12-06: 500 [IU]
  Filled 2016-12-06 (×2): qty 5

## 2016-12-06 MED ORDER — SODIUM CHLORIDE 0.9 % IV SOLN
527.4000 mg | Freq: Once | INTRAVENOUS | Status: AC
  Administered 2016-12-06: 530 mg via INTRAVENOUS
  Filled 2016-12-06: qty 53

## 2016-12-06 MED ORDER — PALONOSETRON HCL INJECTION 0.25 MG/5ML
0.2500 mg | Freq: Once | INTRAVENOUS | Status: AC
  Administered 2016-12-06: 0.25 mg via INTRAVENOUS

## 2016-12-06 MED ORDER — ONDANSETRON HCL 8 MG PO TABS: 8.0000 mg | ORAL_TABLET | Freq: Two times a day (BID) | ORAL | 1 refills | Status: DC | PRN

## 2016-12-06 MED ORDER — FAMOTIDINE IN NACL 20-0.9 MG/50ML-% IV SOLN
INTRAVENOUS | Status: AC
  Filled 2016-12-06: qty 50

## 2016-12-06 MED ORDER — PEGFILGRASTIM 6 MG/0.6ML ~~LOC~~ PSKT
6.0000 mg | PREFILLED_SYRINGE | Freq: Once | SUBCUTANEOUS | Status: AC
  Administered 2016-12-06: 6 mg via SUBCUTANEOUS
  Filled 2016-12-06: qty 0.6

## 2016-12-06 MED ORDER — DEXTROSE 5 % IV SOLN
200.0000 mg/m2 | Freq: Once | INTRAVENOUS | Status: AC
  Administered 2016-12-06: 300 mg via INTRAVENOUS
  Filled 2016-12-06: qty 50

## 2016-12-06 MED ORDER — LEVOFLOXACIN 500 MG PO TABS: 500.0000 mg | ORAL_TABLET | Freq: Every day | ORAL | 0 refills | Status: DC

## 2016-12-06 MED ORDER — HYDROCODONE-ACETAMINOPHEN 5-325 MG PO TABS: 1.0000 | ORAL_TABLET | Freq: Four times a day (QID) | ORAL | 0 refills | Status: DC | PRN

## 2016-12-06 MED ORDER — FAMOTIDINE IN NACL 20-0.9 MG/50ML-% IV SOLN
20.0000 mg | Freq: Once | INTRAVENOUS | Status: AC
Start: 2016-12-06 — End: 2016-12-06
  Administered 2016-12-06: 20 mg via INTRAVENOUS

## 2016-12-06 NOTE — Progress Notes (Signed)
Patient tolerated infusion well.  VSS.  Patient ambulatory and stable upon discharge from clinic.  Patient has follow up appointment with Dr. Kendal Raffo Muse this Friday (12/15) to discuss care moving forward.  Refills for zofran, hydrocodone, and levaquin given today.

## 2016-12-06 NOTE — Patient Instructions (Signed)
[facility] Discharge Instructions for Patients Receiving Chemotherapy   Beginning January 23rd 2017 lab work for the Cancer Center will be done in the  Main lab at [facility] on 1st floor. If you have a lab appointment with the Cancer Center please come in thru the  Main Entrance and check in at the main information desk   Today you received the following chemotherapy agents Taxol and Carbo.  If you develop nausea and vomiting, or diarrhea that is not controlled by your medication, call the clinic.  The clinic phone number is [phone]. Office hours are Monday-Friday 8:30am-5:00pm.  BELOW ARE SYMPTOMS THAT SHOULD BE REPORTED IMMEDIATELY:  *FEVER GREATER THAN 101.0 F  *CHILLS WITH OR WITHOUT FEVER  NAUSEA AND VOMITING THAT IS NOT CONTROLLED WITH YOUR NAUSEA MEDICATION  *UNUSUAL SHORTNESS OF BREATH  *UNUSUAL BRUISING OR BLEEDING  TENDERNESS IN MOUTH AND THROAT WITH OR WITHOUT PRESENCE OF ULCERS  *URINARY PROBLEMS  *BOWEL PROBLEMS  UNUSUAL RASH Items with * indicate a potential emergency and should be followed up as soon as possible. If you have an emergency after office hours please contact your primary care physician or go to the nearest emergency department.  Please call the clinic during office hours if you have any questions or concerns.   You may also contact the Patient Navigator at [phone] should you have any questions or need assistance in obtaining follow up care.      Resources For Cancer Patients and their Caregivers ? American Cancer Society: Can assist with transportation, wigs, general needs, runs Look Good Feel Better.        1-888-227-6333 ? Cancer Care: Provides financial assistance, online support groups, medication/co-pay assistance.  1-800-813-HOPE (4673) ? [facility] Assists [place] cancer patients and their families through emotional , educational and financial support.   336-427-4357 ? [place] DSS Where to apply for food stamps, Medicaid and utility assistance. 336-342-1394 ? RCATS: Transportation to medical appointments. 336-347-2287 ? Social Security Administration: May apply for disability if have a Stage IV cancer. 336-342-7796 1-800-772-1213 ? [place] Aging, Disability and Transit Services: Assists with nutrition, care and transit needs. 336-349-2343         

## 2016-12-08 LAB — URINE CULTURE: CULTURE: NO GROWTH

## 2016-12-10 ENCOUNTER — Ambulatory Visit (HOSPITAL_COMMUNITY): Payer: Medicaid Other

## 2016-12-10 ENCOUNTER — Encounter (HOSPITAL_BASED_OUTPATIENT_CLINIC_OR_DEPARTMENT_OTHER): Payer: Medicaid Other

## 2016-12-10 ENCOUNTER — Encounter (HOSPITAL_BASED_OUTPATIENT_CLINIC_OR_DEPARTMENT_OTHER): Payer: Medicaid Other | Admitting: Hematology & Oncology

## 2016-12-10 ENCOUNTER — Encounter (HOSPITAL_COMMUNITY): Payer: Self-pay | Admitting: Hematology & Oncology

## 2016-12-10 VITALS — BP 110/71 | HR 94 | Temp 98.5°F | Resp 18

## 2016-12-10 DIAGNOSIS — C3411 Malignant neoplasm of upper lobe, right bronchus or lung: Secondary | ICD-10-CM

## 2016-12-10 DIAGNOSIS — D6481 Anemia due to antineoplastic chemotherapy: Secondary | ICD-10-CM

## 2016-12-10 DIAGNOSIS — G893 Neoplasm related pain (acute) (chronic): Secondary | ICD-10-CM

## 2016-12-10 DIAGNOSIS — T451X5A Adverse effect of antineoplastic and immunosuppressive drugs, initial encounter: Secondary | ICD-10-CM

## 2016-12-10 DIAGNOSIS — F419 Anxiety disorder, unspecified: Secondary | ICD-10-CM | POA: Diagnosis not present

## 2016-12-10 MED ORDER — HEPARIN SOD (PORK) LOCK FLUSH 100 UNIT/ML IV SOLN
500.0000 [IU] | Freq: Once | INTRAVENOUS | Status: AC
  Administered 2016-12-10: 500 [IU] via INTRAVENOUS

## 2016-12-10 MED ORDER — SODIUM CHLORIDE 0.9 % IV SOLN
INTRAVENOUS | Status: DC
  Administered 2016-12-10: 08:00:00 via INTRAVENOUS

## 2016-12-10 MED ORDER — TRAMADOL HCL 50 MG PO TABS: 50.0000 mg | ORAL_TABLET | Freq: Four times a day (QID) | ORAL | 1 refills | Status: DC | PRN

## 2016-12-10 NOTE — Patient Instructions (Signed)
[place] at [facility] Discharge Instructions  RECOMMENDATIONS MADE BY THE CONSULTANT AND ANY TEST RESULTS WILL BE SENT TO YOUR REFERRING PHYSICIAN.  1 liter of fluids given today. Follow up as scheduled.  Thank you for choosing [place] at [facility] to provide your oncology and hematology care.  To afford each patient quality time with our provider, please arrive at least 15 minutes before your scheduled appointment time.   Beginning January 23rd 2017 lab work for the [facility] will be done in the  Main lab at [facility] on 1st floor. If you have a lab appointment with the [facility] please come in thru the  Main Entrance and check in at the main information desk  You need to re-schedule your appointment should you arrive 10 or more minutes late.  We strive to give you quality time with our providers, and arriving late affects you and other patients whose appointments are after yours.  Also, if you no show three or more times for appointments you may be dismissed from the clinic at the providers discretion.     Again, thank you for choosing [facility].  Our hope is that these requests will decrease the amount of time that you wait before being seen by our physicians.       _____________________________________________________________  Should you have questions after your visit to [facility], please contact our office at [phone] between the hours of 8:30 a.m. and 4:30 p.m.  Voicemails left after 4:30 p.m. will not be returned until the following business day.  For prescription refill requests, have your pharmacy contact our office.         Resources For Cancer Patients and their Caregivers ? American Cancer Society: Can assist with transportation, wigs, general needs, runs Look Good Feel Better.        517-404-9671 ? Cancer Care: Provides financial assistance, online support groups,  medication/co-pay assistance.  1-800-813-HOPE 409 745 2343) ? [place] Assists [place] cancer patients and their families through emotional , educational and financial support.  (289) 395-6068 ? [place] DSS Where to apply for food stamps, Medicaid and utility assistance. (581) 844-7986 ? RCATS: Transportation to medical appointments. 701-813-3804 ? Social Security Administration: May apply for disability if have a Stage IV cancer. (478) 330-6395 (775) 872-3233 ? [employer], Disability and Transit Services: Assists with nutrition, care and transit needs. [place] Support Programs: '@10RELATIVEDAYS'$ @ > Cancer Support Group  2nd Tuesday of the month 1pm-2pm, Journey Room  > Creative Journey  3rd Tuesday of the month 1130am-1pm, Journey Room  > Look Good Feel Better  1st Wednesday of the month 10am-12 noon, Journey Room (Call [place] to register [phone])

## 2016-12-10 NOTE — Patient Instructions (Addendum)
[place] at [facility] Discharge Instructions  RECOMMENDATIONS MADE BY THE CONSULTANT AND ANY TEST RESULTS WILL BE SENT TO YOUR REFERRING PHYSICIAN.  You saw Dr.Penland today. PET after new year. Follow up after PET. Start Imfinzi after new year.Emily Pope will contact you about this. See Amy at checkout for appointments.  Thank you for choosing [place] at [facility] to provide your oncology and hematology care.  To afford each patient quality time with our provider, please arrive at least 15 minutes before your scheduled appointment time.   Beginning January 23rd 2017 lab work for the [facility] will be done in the  Main lab at [facility] on 1st floor. If you have a lab appointment with the [facility] please come in thru the  Main Entrance and check in at the main information desk  You need to re-schedule your appointment should you arrive 10 or more minutes late.  We strive to give you quality time with our providers, and arriving late affects you and other patients whose appointments are after yours.  Also, if you no show three or more times for appointments you may be dismissed from the clinic at the providers discretion.     Again, thank you for choosing [facility].  Our hope is that these requests will decrease the amount of time that you wait before being seen by our physicians.       _____________________________________________________________  Should you have questions after your visit to [facility], please contact our office at [phone] between the hours of 8:30 a.m. and 4:30 p.m.  Voicemails left after 4:30 p.m. will not be returned until the following business day.  For prescription refill requests, have your pharmacy contact our office.         Resources For Cancer Patients and their Caregivers ? American Cancer Society: Can assist with transportation, wigs, general needs, runs  Look Good Feel Better.        3178437796 ? Cancer Care: Provides financial assistance, online support groups, medication/co-pay assistance.  1-800-813-HOPE (340)268-9018) ? [place] Assists [place] cancer patients and their families through emotional , educational and financial support.  4103263412 ? [place] DSS Where to apply for food stamps, Medicaid and utility assistance. (782)046-7912 ? RCATS: Transportation to medical appointments. 564-094-9882 ? Social Security Administration: May apply for disability if have a Stage IV cancer. 515-409-6134 518-761-6474 ? [employer], Disability and Transit Services: Assists with nutrition, care and transit needs. [place] Support Programs: '@10RELATIVEDAYS'$ @ > Cancer Support Group  2nd Tuesday of the month 1pm-2pm, Journey Room  > Creative Journey  3rd Tuesday of the month 1130am-1pm, Journey Room  > Look Good Feel Better  1st Wednesday of the month 10am-12 noon, Journey Room (Call [place] to register [phone])

## 2016-12-10 NOTE — Progress Notes (Signed)
Emily Chroman, MD [place] [id number]  Malignant neoplasm of upper lobe of right lung ([place]) - Plan: NM PET Image Restag (PS) Skull Base To Thigh, traMADol (ULTRAM) 50 MG tablet  CURRENT THERAPY: Carboplatin/Paclitaxel every 21 days x 2 cycles.    Primary cancer of right upper lobe of lung ([place])   08/04/2016 PET scan    PET IMPRESSION: 1. 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 Mass is confluent with the right superior hilum and right lower tracheal wall, suggestive of a T4 primary tumor. 2. Postobstructive pneumonia in the right upper lobe. 3. Hypermetabolic ipsilateral and contralateral mediastinal and contralateral hilar lymphadenopathy, suggestive of N3 nodal disease.      08/24/2016 Procedure    Bronchoscopy      08/25/2016 Pathology Results    Diagnosis Endobronchial biopsy, RUL - POORLY DIFFERENTIATED NON-SMALL CELL CARCINOMA. The immunophenotype is consistent with poorly differentiated adenocarcinoma.      09/10/2016 Imaging    MRI brain- No metastatic disease or acute intracranial abnormality.      09/20/2016 - 10/25/2016 Chemotherapy    Carboplatin/Paclitaxel weekly x 6 with XRT.      09/20/2016 - 10/25/2016 Radiation Therapy         11/15/2016 - 12/06/2016 Chemotherapy    Carboplatin/paclitaxel every 21 days x 2 cycles.        INTERVAL HISTORY: Emily Pope 60 y.o. 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  Patient is accompanied by her husband and receiving IV fluids.   She was given a prescription for Vicodin, but prefers to  take Tramadol. She needs Tramadol refilled.   Sontee reports a dry, faint itchy rash on her lower back. Denies abdominal pain. No nausea or vomiting. She notes that she is doing fairly well. She is relieved that she has completed current therapy.  Her mood is fairly good. She is sleeping ok. No neuropathy. No diarrhea.   Review of Systems  Eyes: Negative.   Respiratory: Negative.   Cardiovascular: Negative.   Gastrointestinal: Negative for vomiting.  Genitourinary: Negative.   Skin: Positive for rash.       Rash on lower back  Endo/Heme/Allergies: Negative.   Psychiatric/Behavioral: Negative.   14 point review of systems was performed and is negative except as detailed under history of present illness and above   Past Medical History:  Diagnosis Date  . Anxiety    uses it for menopause   . Arthritis   . Cancer ([place])    Right Lung  . Chronic bronchitis ([place])   . COPD (chronic obstructive pulmonary disease) ([place])   . GERD (gastroesophageal reflux disease)   . Headache    sinus  . Hypertension   . Pneumonia    07/22/16-had pneumonia in right lung and found mass in lung  . Primary cancer of right upper lobe of lung ([place]) 08/23/2016    Past Surgical History:  Procedure Laterality Date  . ABDOMINAL HYSTERECTOMY    . APPENDECTOMY     when had hysterectomy  . ENDOBRONCHIAL ULTRASOUND Bilateral 08/23/2016   Procedure: ENDOBRONCHIAL ULTRASOUND;  Surgeon: Collene Gobble, MD;  Location: [facility];  Service: Cardiopulmonary;  Laterality: Bilateral;  . HERNIA REPAIR  left inguinal hernia age 25  . PORTACATH PLACEMENT Left 09/17/2016   Procedure: INSERTION PORT-A-CATH LEFT SUBCLAVIAN;  Surgeon: Aviva Signs, MD;  Location: [facility];  Service: General;  Laterality: Left;  . TUBAL LIGATION      Family History  Problem Relation Age of Onset  . Cancer Mother     lung  . Hypertension Mother   . Hypertension Father   . Diabetes Father   . Hypertension Sister   . Cancer  Brother     lung  . Hypertension Sister     Social History   Social History  . Marital status: Divorced    Spouse name: N/A  . Number of children: N/A  . Years of education: N/A   Social History Main Topics  . Smoking status: Former Smoker    Packs/day: 2.00    Years: 43.00    Quit date: 07/20/2016  . Smokeless tobacco: Never Used  . Alcohol use No  . Drug use: No  . Sexual activity: Not Asked   Other Topics Concern  . None   Social History Narrative  . None     PHYSICAL EXAMINATION  ECOG PERFORMANCE STATUS: 1 - Symptomatic but completely ambulatory  Vitals - 1 value per visit 55/73/2202  SYSTOLIC 542  DIASTOLIC 71  Pulse 94  Temperature 98.5  Respirations 18    Physical Exam  Constitutional: She is oriented to person, place, and time and well-developed, well-nourished, and in no distress.  HENT:  Head: Normocephalic and atraumatic.  Nose: Nose normal.  Mouth/Throat: Oropharynx is clear and moist. No oropharyngeal exudate.  Eyes: Conjunctivae and EOM are normal. Pupils are equal, round, and reactive to light. Right eye exhibits no discharge. Left eye exhibits no discharge. No scleral icterus.  Neck: Normal range of motion. Neck supple. No tracheal deviation present. No thyromegaly present.  Cardiovascular: Normal rate, regular rhythm and normal heart sounds.  Exam reveals no gallop and no friction rub.   No murmur heard. Pulmonary/Chest: Effort normal and breath sounds normal. She has no wheezes. She has no rales.  Abdominal: Soft. Bowel sounds are normal. She exhibits no distension and no mass. There is no tenderness. There is no rebound and no guarding.  Musculoskeletal: Normal range of motion. She exhibits no edema.  Lymphadenopathy:    She has no cervical adenopathy.  Neurological: She is alert and oriented to person, place, and time. She has normal reflexes. No cranial nerve deficit. Gait normal. Coordination normal.  Skin: Skin is warm and dry. Rash  noted.  Very faint rash on lower back   Psychiatric: Mood, memory, affect and judgment normal.  Nursing note and vitals reviewed.   LABORATORY DATA: I have reviewed the data as listed. CBC    Component Value Date/Time   WBC 3.9 (L) 12/06/2016 0933   RBC 2.76 (L) 12/06/2016 0933   HGB 9.5 (L) 12/06/2016 0933   HGB 12.9 09/02/2016 1403   HCT 27.8 (L) 12/06/2016 0933   HCT 38.4 09/02/2016 1403   PLT 298 12/06/2016 0933   PLT 476 (H) 09/02/2016 1403   MCV 100.7 (H) 12/06/2016 0933   MCV 94.6 09/02/2016 1403   MCH 34.4 (H) 12/06/2016 0933   MCHC 34.2 12/06/2016 0933   RDW 17.0 (H) 12/06/2016 0933   RDW 13.6 09/02/2016 1403   LYMPHSABS 0.4 (L) 12/06/2016 0933   LYMPHSABS 2.5 09/02/2016 1403   MONOABS 0.6 12/06/2016 0933   MONOABS 0.7 09/02/2016 1403   EOSABS 0.0 12/06/2016 0933  EOSABS 0.7 (H) 09/02/2016 1403   BASOSABS 0.0 12/06/2016 0933   BASOSABS 0.1 09/02/2016 1403      Chemistry      Component Value Date/Time   NA 134 (L) 12/06/2016 0933   NA 140 09/02/2016 1403   K 4.0 12/06/2016 0933   K 4.2 09/02/2016 1403   CL 101 12/06/2016 0933   CO2 26 12/06/2016 0933   CO2 27 09/02/2016 1403   BUN 18 12/06/2016 0933   BUN 15.6 09/02/2016 1403   CREATININE 0.60 12/06/2016 0933   CREATININE 0.8 09/02/2016 1403      Component Value Date/Time   CALCIUM 9.2 12/06/2016 0933   CALCIUM 9.6 09/02/2016 1403   ALKPHOS 105 12/06/2016 0933   ALKPHOS 159 (H) 09/02/2016 1403   AST 14 (L) 12/06/2016 0933   AST 12 09/02/2016 1403   ALT 14 12/06/2016 0933   ALT <9 09/02/2016 1403   BILITOT 0.5 12/06/2016 0933   BILITOT <0.30 09/02/2016 1403       RADIOGRAPHIC STUDIES: I have personally reviewed the radiological images as listed and agreed with the findings in the report. Study Result   CLINICAL DATA:  60 year old female diagnosed with right side lung cancer in July. Staging. Subsequent encounter.  EXAM: MRI HEAD WITHOUT AND WITH CONTRAST  TECHNIQUE: Multiplanar,  multiecho pulse sequences of the brain and surrounding structures were obtained without and with intravenous contrast.  CONTRAST:  6m MULTIHANCE GADOBENATE DIMEGLUMINE 529 MG/ML IV SOLN  [facility]  [facility].  FINDINGS: Brain: Mild motion artifact on some postcontrast imaging. No abnormal enhancement identified. No midline shift, mass effect, or evidence of intracranial mass lesion. No dural thickening.  No restricted diffusion to suggest acute infarction. No ventriculomegaly, extra-axial collection or acute intracranial hemorrhage. Cervicomedullary junction and pituitary are within normal limits.  Scattered small mostly subcortical cerebral white matter T2 and FLAIR hyperintense foci in a nonspecific configuration. Extent is moderate for age. No cortical encephalomalacia. No chronic cerebral blood products. Small chronic lacunar infarct in the right basal ganglia. Otherwise negative deep gray matter nuclei, brainstem, and cerebellum.  Vascular: Major intracranial vascular flow voids are preserved. Mild intracranial artery dolichoectasia.  Skull and upper cervical spine: Negative visualized cervical spine. No destructive osseous lesion identified.  Sinuses/Orbits: Negative orbits soft tissues. Scattered mild to moderate paranasal sinus mucosal thickening.  Other: Visible internal auditory structures appear normal. Mastoids are clear. Negative scalp soft tissues.  IMPRESSION: 1.  No metastatic disease or acute intracranial abnormality. 2. Mild to moderate for age nonspecific signal changes in the brain likely due to chronic small vessel disease. 3. Mild to moderate paranasal sinus inflammation.   Electronically Signed   By: HGenevie AnnM.D.   On: 09/10/2016 10:18     PATHOLOGY:    ASSESSMENT AND PLAN:  Stage IIIB NSCLC, poorly differentiated,  favors adenocarcinoma S/P Concurrent ChemoXRT complicated by Radiation induced Esophagitis S/P 2 additional cycles of Carboplatin/Paclitaxel following concurrent chemoXRT Anxiety Cancer related pain Chemotherapy induced anemia Constipation  I have evaluated Lolah's rash. It appears to be dry skin. Recommended unscented lotion. She is to call if it worsens.   Refilled Tramadol.  Restaging PET scan is ordered and scheduled for 12/30/2015.  Return following PET imaging.  THERAPY PLAN:  Restaging PET scan in ~ 2-3 weeks.  Will discuss ongoing consolidative therapy with Imfinzi immunotherapy following PET scan; hopefully this is approved by the FDA in Jan 2018.  In  a phase III trial, over 700 patients with unresectable stage III NSCLC without progression after at least two cycles of platinum-based chemoradiotherapy were randomly assigned to the programmed death ligand 1 (PD-L1) antibody durvalumab or placebo in a 2:1 ratio. Relative to placebo, durvalumab increased the median PFS (16.8 versus 5.6 months; HR for disease progression or death 0.52, 95% CI 0.42-0.65), response rate (28 versus 16 percent; relative risk [RR] 1.78, 95% CI 1.27-2.51), and median time to death or distant metastasis (23.2 versus 14.6 months; HR 0.52, 95% CI 0.39-0.69). The benefit in PFS with durvalumab was observed irrespective of PD-L1 expression before chemoradiotherapy and was evident in both smokers and nonsmokers. OS results are immature. Grade 3 or 4 adverse events occurred in 30 percent of the patients who received durvalumab and 26 percent of those who received placebo, with the most common severe adverse event being pneumonia. These results demonstrate efficacy and tolerability of durvalumab for treatment of unresectable stage III NSCLC in patients who experience an objective response or stable disease following completion of chemoradiotherapy.  All questions were answered. The patient knows to call the clinic with  any problems, questions or concerns. We can certainly see the patient much sooner if necessary.  This document serves as a record of services personally performed by Ancil Linsey, MD. It was created on her behalf by Elmyra Ricks, a trained medical scribe. The creation of this record is based on the scribe's personal observations and the provider's statements to them. This document has been checked and approved by the attending provider.  I have reviewed the above documentation for accuracy and completeness and I agree with the above.  This note is electronically signed [id number] Elio Forget, MD  12/21/2016 7:26 PM

## 2016-12-10 NOTE — Progress Notes (Signed)
1 liter of IVF given today per orders. Patient vitals stable and discharged ambulatory from clinic.

## 2016-12-24 ENCOUNTER — Other Ambulatory Visit (HOSPITAL_COMMUNITY): Payer: Self-pay | Admitting: Oncology

## 2016-12-24 ENCOUNTER — Encounter (HOSPITAL_COMMUNITY): Payer: Self-pay | Admitting: Hematology & Oncology

## 2016-12-24 ENCOUNTER — Telehealth (HOSPITAL_COMMUNITY): Payer: Self-pay | Admitting: Oncology

## 2016-12-24 DIAGNOSIS — C3411 Malignant neoplasm of upper lobe, right bronchus or lung: Secondary | ICD-10-CM

## 2016-12-24 NOTE — Telephone Encounter (Signed)
Peer to peer for restaging PET scan is denied by insurance.  Will cancel this test and proceed with CT chest and abdomen with contrast for restaging followed by PET imaging if needed.  Finnian Husted, PA-C 12/24/2016 8:25 AM

## 2016-12-29 ENCOUNTER — Ambulatory Visit (HOSPITAL_COMMUNITY): Payer: Medicaid Other

## 2016-12-29 ENCOUNTER — Ambulatory Visit (HOSPITAL_COMMUNITY)
Admission: RE | Admit: 2016-12-29 | Discharge: 2016-12-29 | Disposition: A | Discharge status: Home / Self Care | Payer: Medicaid Other | Source: Ambulatory Visit | Attending: Oncology | Admitting: Oncology

## 2016-12-29 DIAGNOSIS — I700 Atherosclerosis of aorta: Secondary | ICD-10-CM | POA: Diagnosis not present

## 2016-12-29 DIAGNOSIS — I2510 Atherosclerotic heart disease of native coronary artery without angina pectoris: Secondary | ICD-10-CM | POA: Diagnosis not present

## 2016-12-29 DIAGNOSIS — C3411 Malignant neoplasm of upper lobe, right bronchus or lung: Secondary | ICD-10-CM | POA: Insufficient documentation

## 2016-12-29 DIAGNOSIS — J439 Emphysema, unspecified: Secondary | ICD-10-CM | POA: Insufficient documentation

## 2016-12-29 MED ORDER — IOPAMIDOL (ISOVUE-300) INJECTION 61%
100.0000 mL | Freq: Once | INTRAVENOUS | Status: AC | PRN
  Administered 2016-12-29: 100 mL via INTRAVENOUS

## 2016-12-30 ENCOUNTER — Encounter (HOSPITAL_COMMUNITY): Payer: Self-pay | Admitting: Hematology & Oncology

## 2016-12-30 ENCOUNTER — Encounter (HOSPITAL_COMMUNITY): Payer: Medicaid Other | Attending: Hematology & Oncology | Admitting: Hematology & Oncology

## 2016-12-30 ENCOUNTER — Encounter (HOSPITAL_COMMUNITY): Payer: Medicaid Other

## 2016-12-30 VITALS — BP 137/77 | HR 98 | Temp 98.0°F | Resp 20 | Wt 116.0 lb

## 2016-12-30 DIAGNOSIS — C3411 Malignant neoplasm of upper lobe, right bronchus or lung: Secondary | ICD-10-CM | POA: Diagnosis present

## 2016-12-30 DIAGNOSIS — J42 Unspecified chronic bronchitis: Secondary | ICD-10-CM

## 2016-12-30 DIAGNOSIS — D6481 Anemia due to antineoplastic chemotherapy: Secondary | ICD-10-CM | POA: Diagnosis not present

## 2016-12-30 DIAGNOSIS — Z9221 Personal history of antineoplastic chemotherapy: Secondary | ICD-10-CM | POA: Diagnosis not present

## 2016-12-30 DIAGNOSIS — F419 Anxiety disorder, unspecified: Secondary | ICD-10-CM | POA: Diagnosis not present

## 2016-12-30 DIAGNOSIS — R0602 Shortness of breath: Secondary | ICD-10-CM

## 2016-12-30 DIAGNOSIS — T451X5A Adverse effect of antineoplastic and immunosuppressive drugs, initial encounter: Secondary | ICD-10-CM

## 2016-12-30 LAB — CBC WITH DIFFERENTIAL/PLATELET
BASOS ABS: 0 10*3/uL (ref 0.0–0.1)
Basophils Relative: 1 %
Eosinophils Absolute: 0.1 10*3/uL (ref 0.0–0.7)
Eosinophils Relative: 3 %
HCT: 27.9 % — ABNORMAL LOW (ref 36.0–46.0)
HEMOGLOBIN: 9.6 g/dL — AB (ref 12.0–15.0)
LYMPHS PCT: 13 %
Lymphs Abs: 0.5 10*3/uL — ABNORMAL LOW (ref 0.7–4.0)
MCH: 37.9 pg — ABNORMAL HIGH (ref 26.0–34.0)
MCHC: 34.4 g/dL (ref 30.0–36.0)
MCV: 110.3 fL — ABNORMAL HIGH (ref 78.0–100.0)
MONO ABS: 0.2 10*3/uL (ref 0.1–1.0)
MONOS PCT: 6 %
NEUTROS ABS: 3.1 10*3/uL (ref 1.7–7.7)
NEUTROS PCT: 77 %
Platelets: 283 10*3/uL (ref 150–400)
RBC: 2.53 MIL/uL — ABNORMAL LOW (ref 3.87–5.11)
RDW: 18.1 % — AB (ref 11.5–15.5)
WBC: 4 10*3/uL (ref 4.0–10.5)

## 2016-12-30 LAB — IRON AND TIBC
Iron: 66 ug/dL (ref 28–170)
Saturation Ratios: 20 % (ref 10.4–31.8)
TIBC: 336 ug/dL (ref 250–450)
UIBC: 270 ug/dL

## 2016-12-30 LAB — FERRITIN: FERRITIN: 96 ng/mL (ref 11–307)

## 2016-12-30 LAB — VITAMIN B12: Vitamin B-12: 1868 pg/mL — ABNORMAL HIGH (ref 180–914)

## 2016-12-30 MED ORDER — LEVOFLOXACIN 500 MG PO TABS: 500.0000 mg | ORAL_TABLET | Freq: Every day | ORAL | 0 refills | Status: DC

## 2016-12-30 MED ORDER — PREDNISONE 10 MG PO TABS: ORAL_TABLET | ORAL | 0 refills | Status: DC

## 2016-12-30 NOTE — Patient Instructions (Addendum)
[place] at [facility] Discharge Instructions  RECOMMENDATIONS MADE BY THE CONSULTANT AND ANY TEST RESULTS WILL BE SENT TO YOUR REFERRING PHYSICIAN.  You were seen today by Dr. Gustavus Bryant again today Prescriptions for levaquin 500 mg once daily x 7 days, prednisone as directed  Thank you for choosing [place] at [facility] to provide your oncology and hematology care.  To afford each patient quality time with our provider, please arrive at least 15 minutes before your scheduled appointment time.    If you have a lab appointment with the [facility] please come in thru the  Main Entrance and check in at the main information desk  You need to re-schedule your appointment should you arrive 10 or more minutes late.  We strive to give you quality time with our providers, and arriving late affects you and other patients whose appointments are after yours.  Also, if you no show three or more times for appointments you may be dismissed from the clinic at the providers discretion.     Again, thank you for choosing [facility].  Our hope is that these requests will decrease the amount of time that you wait before being seen by our physicians.       _____________________________________________________________  Should you have questions after your visit to [facility], please contact our office at [phone] between the hours of 8:30 a.m. and 4:30 p.m.  Voicemails left after 4:30 p.m. will not be returned until the following business day.  For prescription refill requests, have your pharmacy contact our office.       Resources For Cancer Patients and their Caregivers ? American Cancer Society: Can assist with transportation, wigs, general needs, runs Look Good Feel Better.        206-597-5154 ? Cancer Care: Provides financial assistance, online support groups, medication/co-pay assistance.  1-800-813-HOPE  704-609-5112) ? [facility] Assists [place] cancer patients and their families through emotional , educational and financial support.  5121194228 ? [place] DSS Where to apply for food stamps, Medicaid and utility assistance. 671-551-3647 ? [facility]: Transportation to medical appointments. 248 326 5501 ? Social Security Administration: May apply for disability if have a Stage IV cancer. 867-164-6033 424-357-9814 ? [employer], Disability and Transit Services: Assists with nutrition, care and transit needs. [place] Support Programs: '@10RELATIVEDAYS'$ @ > Cancer Support Group  2nd Tuesday of the month 1pm-2pm, Journey Room  > Creative Journey  3rd Tuesday of the month 1130am-1pm, Journey Room  > Look Good Feel Better  1st Wednesday of the month 10am-12 noon, Journey Room (Call [place] to register [phone])

## 2016-12-30 NOTE — Progress Notes (Signed)
Emily Pope presented for labwork. Labs per MD order drawn via Peripheral Line 23 gauge needle inserted in LT AC Good blood return present. Procedure without incident.  Needle removed intact. Patient tolerated procedure well.

## 2016-12-30 NOTE — Progress Notes (Signed)
Emily Chroman, MD [place] [id number]  No diagnosis found.  CURRENT THERAPY: Carboplatin/Paclitaxel every 21 days x 2 cycles.    Primary cancer of right upper lobe of lung ([place])   08/04/2016 PET scan    PET IMPRESSION: 1. 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 Mass is confluent with the right superior hilum and right lower tracheal wall, suggestive of a T4 primary tumor. 2. Postobstructive pneumonia in the right upper lobe. 3. Hypermetabolic ipsilateral and contralateral mediastinal and contralateral hilar lymphadenopathy, suggestive of N3 nodal disease.      08/24/2016 Procedure    Bronchoscopy      08/25/2016 Pathology Results    Diagnosis Endobronchial biopsy, RUL - POORLY DIFFERENTIATED NON-SMALL CELL CARCINOMA. The immunophenotype is consistent with poorly differentiated adenocarcinoma.      09/10/2016 Imaging    MRI brain- No metastatic disease or acute intracranial abnormality.      09/20/2016 - 10/25/2016 Chemotherapy    Carboplatin/Paclitaxel weekly x 6 with XRT.      09/20/2016 - 10/25/2016 Radiation Therapy         11/15/2016 - 12/06/2016 Chemotherapy    Carboplatin/paclitaxel every 21 days x 2 cycles.      12/30/2016 Imaging    CT CAP- 1. Central right upper lobe lung mass has mildly decreased since 11/21/2016 chest CT angiogram and significantly decreased since 08/04/2016 PET-CT. 2. No pathologically enlarged thoracic lymph nodes. Previously described hypermetabolic thoracic adenopathy on the 08/04/2016 PET-CT has decreased in size. 3. Previously described bilateral lower lobe pulmonary nodules are stable in size. 4. New solitary subsolid 4 mm right middle lobe pulmonary nodule is nonspecific, and may be inflammatory. Recommend attention on a follow-up chest CT in 3 months. 5. No  definite metastatic disease in the abdomen. Tiny sub 5 mm low-attenuation lesions in the liver are too small to characterize, favor benign. 6. Aortic atherosclerosis. One vessel coronary atherosclerosis. 7. Moderate emphysema.        INTERVAL HISTORY: Emily Pope 61 y.o. 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  Patient is accompanied by her husband.   I personally reviewed and went over CT scan and labs with the patient.  Patient states she has had SOB for a couple weeks now and it happens when she walks. As a result, her legs get weak and she starts gasping for air. She is really concerned about being weak and SOB.  When she walked earlier today her oxygen level dropped to 97 and her heart rate went up. She states she has congestion and is coughing up milky sputum.  Reports she isnt coughing that much at home and not really at night. The SOB has been prevalent over the last 3-4 weeks.   Husband reports that in the morning if she is pale he knows she is not going to be feeling well that day, and if she has color then she is feeling better.  She states that her hands itch really bad while washing dishes. Her appetite has been improving.   No nausea or vomiting. No headaches or blurry vision.   Review of Systems  HENT: Positive for congestion.   Eyes: Negative.   Respiratory: Positive for  sputum production (milky) and shortness of breath (While walking).   Cardiovascular: Negative.   Genitourinary: Negative.   Endo/Heme/Allergies: Negative.   Psychiatric/Behavioral: Negative.   14 point review of systems was performed and is negative except as detailed under history of present illness and above   Past Medical  History:  Diagnosis Date  . Anxiety    uses it for menopause   . Arthritis   . Cancer ([place])    Right Lung  . Chronic bronchitis ([place])   . COPD (chronic obstructive pulmonary disease) ([place])   . GERD (gastroesophageal reflux disease)   . Headache    sinus  . Hypertension   . Pneumonia    07/22/16-had pneumonia in right lung and found mass in lung  . Primary cancer of right upper lobe of lung ([place]) 08/23/2016    Past Surgical History:  Procedure Laterality Date  . ABDOMINAL HYSTERECTOMY    . APPENDECTOMY     when had hysterectomy  . ENDOBRONCHIAL ULTRASOUND Bilateral 08/23/2016   Procedure: ENDOBRONCHIAL ULTRASOUND;  Surgeon: Collene Gobble, MD;  Location: [facility];  Service: Cardiopulmonary;  Laterality: Bilateral;  . HERNIA REPAIR     left inguinal hernia age 22  . PORTACATH PLACEMENT Left 09/17/2016   Procedure: INSERTION PORT-A-CATH LEFT SUBCLAVIAN;  Surgeon: Aviva Signs, MD;  Location: [facility];  Service: General;  Laterality: Left;  . TUBAL LIGATION      Family History  Problem Relation Age of Onset  . Cancer Mother     lung  . Hypertension Mother   . Hypertension Father   . Diabetes Father   . Hypertension Sister   . Cancer Brother     lung  . Hypertension Sister     Social History   Social History  . Marital status: Divorced    Spouse name: N/A  . Number of children: N/A  . Years of education: N/A   Social History Main Topics  . Smoking status: Former Smoker    Packs/day: 2.00    Years: 43.00    Quit date: 07/20/2016  . Smokeless tobacco: Never Used  . Alcohol use No  . Drug use: No  . Sexual activity: Not on file   Other Topics Concern  . Not on file   Social History Narrative  . No narrative on file     PHYSICAL EXAMINATION  ECOG PERFORMANCE STATUS: 1 - Symptomatic but completely ambulatory  Vitals with BMI 12/30/2016  Height   Weight 116 lbs  BMI   Systolic 786  Diastolic 77  Pulse 98  Respirations 20    Physical Exam    Constitutional: She is oriented to person, place, and time and well-developed, well-nourished, and in no distress.  HENT:  Head: Normocephalic and atraumatic.  Nose: Nose normal.  Mouth/Throat: Oropharynx is clear and moist. No oropharyngeal exudate.  Eyes: Conjunctivae and EOM are normal. Pupils are equal, round, and reactive to light. Right eye exhibits no discharge. Left eye exhibits no discharge. No scleral icterus.  Neck: Normal range of motion. Neck supple. No tracheal deviation present. No thyromegaly present.  Cardiovascular: Normal rate, regular rhythm and normal heart sounds.  Exam reveals no gallop and no friction rub.   No murmur heard. Pulmonary/Chest: Effort normal and breath sounds normal. She has no wheezes. She has no rales.  Abdominal: Soft. Bowel sounds are normal. She exhibits no distension and no mass. There is no tenderness. There is no rebound and no guarding.  Musculoskeletal: Normal range of motion.  She exhibits no edema.  Lymphadenopathy:    She has no cervical adenopathy.  Neurological: She is alert and oriented to person, place, and time. She has normal reflexes. No cranial nerve deficit. Gait normal. Coordination normal.  Skin: Skin is warm and dry.  Psychiatric: Mood, memory, affect and judgment normal.  Nursing note and vitals reviewed.   LABORATORY DATA: I have reviewed the data as listed. Results for TOREE, EDLING (MRN [id number])   Ref. Range 12/30/2016 14:00  WBC Latest Ref Range: 4.0 - 10.5 K/uL 4.0  RBC Latest Ref Range: 3.87 - 5.11 MIL/uL 2.53 (L)  Hemoglobin Latest Ref Range: 12.0 - 15.0 g/dL 9.6 (L)  HCT Latest Ref Range: 36.0 - 46.0 % 27.9 (L)  MCV Latest Ref Range: 78.0 - 100.0 fL 110.3 (H)  MCH Latest Ref Range: 26.0 - 34.0 pg 37.9 (H)  MCHC Latest Ref Range: 30.0 - 36.0 g/dL 34.4  RDW Latest Ref Range: 11.5 - 15.5 % 18.1 (H)  Platelets Latest Ref Range: 150 - 400 K/uL 283  Neutrophils Latest Units: % 77  Lymphocytes Latest Units: % 13   Monocytes Relative Latest Units: % 6  Eosinophil Latest Units: % 3  Basophil Latest Units: % 1  NEUT# Latest Ref Range: 1.7 - 7.7 K/uL 3.1  Lymphocyte # Latest Ref Range: 0.7 - 4.0 K/uL 0.5 (L)  Monocyte # Latest Ref Range: 0.1 - 1.0 K/uL 0.2  Eosinophils Absolute Latest Ref Range: 0.0 - 0.7 K/uL 0.1  Basophils Absolute Latest Ref Range: 0.0 - 0.1 K/uL 0.0      Chemistry      Component Value Date/Time   NA 134 (L) 12/06/2016 0933   NA 140 09/02/2016 1403   K 4.0 12/06/2016 0933   K 4.2 09/02/2016 1403   CL 101 12/06/2016 0933   CO2 26 12/06/2016 0933   CO2 27 09/02/2016 1403   BUN 18 12/06/2016 0933   BUN 15.6 09/02/2016 1403   CREATININE 0.60 12/06/2016 0933   CREATININE 0.8 09/02/2016 1403      Component Value Date/Time   CALCIUM 9.2 12/06/2016 0933   CALCIUM 9.6 09/02/2016 1403   ALKPHOS 105 12/06/2016 0933   ALKPHOS 159 (H) 09/02/2016 1403   AST 14 (L) 12/06/2016 0933   AST 12 09/02/2016 1403   ALT 14 12/06/2016 0933   ALT <9 09/02/2016 1403   BILITOT 0.5 12/06/2016 0933   BILITOT <0.30 09/02/2016 1403       RADIOGRAPHIC STUDIES: I have personally reviewed the radiological images as listed and agreed with the findings in the report. Study Result   CLINICAL DATA:  Stage IIIb poorly differentiated primary bronchogenic adenocarcinoma of the right upper lobe diagnosed August 2017, completed radiation therapy 10/25/2016 with most recent chemotherapy 12/06/2016. Restaging.  EXAM: CT CHEST AND ABDOMEN WITH CONTRAST  TECHNIQUE: Multidetector CT imaging of the chest and abdomen was performed following the standard protocol during bolus administration of intravenous contrast.  CONTRAST:  170m ISOVUE-300 IOPAMIDOL (ISOVUE-300) INJECTION 61%  COMPARISON:  11/21/2016 chest CT angiogram.  08/04/2016 PET-CT .  FINDINGS: CT CHEST FINDINGS  Cardiovascular: Normal heart size. No significant pericardial fluid/thickening. Left subclavian MediPort terminates  in the lower third of the superior vena cava. Left anterior descending coronary atherosclerosis. Atherosclerotic nonaneurysmal thoracic aorta. Normal caliber pulmonary arteries. No central pulmonary emboli.  Mediastinum/Nodes: No discrete thyroid nodules. Unremarkable esophagus. No pathologically enlarged axillary, mediastinal or hilar lymph nodes. Previously described mildly enlarged hypermetabolic 1.2 cm right paratracheal node on 08/04/2016 PET-CT study is  decreased to 0.6 cm. Previously described mildly enlarged hypermetabolic subcarinal 1.2 cm node has decreased to 0.7 cm.  Lungs/Pleura: No pneumothorax. No pleural effusion. Moderate centrilobular emphysema with mild diffuse bronchial wall thickening. Central right upper lobe 4.9 x 3.7 cm lung mass (series 3/image 35) with amorphous internal calcifications, decreased from 5.2 x 4.0 cm on 11/21/2016 using similar measurement technique and 7.9 x 5.6 cm on 08/04/2016. Patchy reticulation and ground-glass opacity surrounding of the right upper lobe lung mass is consistent with evolving postradiation change. New subsolid 4 mm peripheral right middle lobe nodule (series 3/image 76). Peripheral basilar right lower lobe 7 mm solid pulmonary nodule (series 3/image 114) is stable back to 08/04/2016. Subsolid left lower lobe 4 mm nodule (series 3/image 77) is stable back to 08/04/2016. No acute consolidative airspace disease or additional significant pulmonary nodules.  Musculoskeletal: No aggressive appearing focal osseous lesions. Mild thoracic spondylosis.  CT ABDOMEN FINDINGS  Hepatobiliary: Normal liver size. Stable simple 0.9 cm right liver dome cyst. At least 5 additional sub 5 mm hypodense lesions scattered throughout the liver are too small to characterize, and for which accurate comparison cannot be made to the prior noncontrast PET-CT images. Normal gallbladder with no radiopaque cholelithiasis. No biliary ductal  dilatation.  Pancreas: Normal, with no mass or duct dilation.  Spleen: Normal size. No mass.  Adrenals/Urinary Tract: Normal adrenals. Normal kidneys with no hydronephrosis and no renal mass.  Stomach/Bowel: Grossly normal stomach. Visualized small and large bowel is normal caliber, with no bowel wall thickening.  Vascular/Lymphatic: Atherosclerotic nonaneurysmal abdominal aorta. Patent portal, splenic, hepatic and renal veins. No pathologically enlarged lymph nodes in the abdomen.  Other: No pneumoperitoneum, ascites or focal fluid collection.  Musculoskeletal: No aggressive appearing focal osseous lesions. Mild lumbar spondylosis.  IMPRESSION: 1. Central right upper lobe lung mass has mildly decreased since 11/21/2016 chest CT angiogram and significantly decreased since 08/04/2016 PET-CT. 2. No pathologically enlarged thoracic lymph nodes. Previously described hypermetabolic thoracic adenopathy on the 08/04/2016 PET-CT has decreased in size. 3. Previously described bilateral lower lobe pulmonary nodules are stable in size. 4. New solitary subsolid 4 mm right middle lobe pulmonary nodule is nonspecific, and may be inflammatory. Recommend attention on a follow-up chest CT in 3 months. 5. No definite metastatic disease in the abdomen. Tiny sub 5 mm low-attenuation lesions in the liver are too small to characterize, favor benign. 6. Aortic atherosclerosis.  One vessel coronary atherosclerosis. 7. Moderate emphysema.   Electronically Signed   By: Ilona Sorrel M.D.   On: 12/30/2016 08:41       PATHOLOGY:    ASSESSMENT AND PLAN:  Stage IIIB NSCLC, poorly differentiated, favors adenocarcinoma S/P Concurrent ChemoXRT complicated by Radiation induced Esophagitis S/P 2 additional cycles of Carboplatin/Paclitaxel following concurrent chemoXRT Anxiety Cancer related pain Chemotherapy induced anemia Constipation   CT and labs reviewed. Results are noted  above. Compared to original imaging she has had an excellent response.   CBC was done today and she remains anemic. Anemia panel has been added. She will be notified of results when available.   I have written a prescription for steroids and antibiotics. If the patients breathing has not improved by next Wednesday she will call our clinic, further recommendations will be made at that time.   We discussed data regarding imfinzi in stage III NSCLC.   In a phase III trial, over 700 patients with unresectable stage III NSCLC without progression after at least two cycles of platinum-based chemoradiotherapy were randomly assigned to  the programmed death ligand 1 (PD-L1) antibody durvalumab or placebo in a 2:1 ratio. Relative to placebo, durvalumab increased the median PFS (16.8 versus 5.6 months; HR for disease progression or death 0.52, 95% CI 0.42-0.65), response rate (28 versus 16 percent; relative risk [RR] 1.78, 95% CI 1.27-2.51), and median time to death or distant metastasis (23.2 versus 14.6 months; HR 0.52, 95% CI 0.39-0.69). The benefit in PFS with durvalumab was observed irrespective of PD-L1 expression before chemoradiotherapy and was evident in both smokers and nonsmokers. OS results are immature. Grade 3 or 4 adverse events occurred in 30 percent of the patients who received durvalumab and 26 percent of those who received placebo, with the most common severe adverse event being pneumonia. These results demonstrate efficacy and tolerability of durvalumab for treatment of unresectable stage III NSCLC in patients who experience an objective response or stable disease following completion of chemoradiotherapy.   THERAPY PLAN:  Anemia panel today Antibiotics/steroids for her persistent pulmonary complaints Adjuvant imfinzi. Will arrange for teaching  All questions were answered. The patient knows to call the clinic with any problems, questions or concerns. We can certainly see the patient much  sooner if necessary.  This document serves as a record of services personally performed by Ancil Linsey, MD. It was created on her behalf by Shirlean Mylar, a trained medical scribe. The creation of this record is based on the scribe's personal observations and the provider's statements to them. This document has been checked and approved by the attending provider.  I have reviewed the above documentation for accuracy and completeness and I agree with the above.  This note is electronically signed [id number] Elio Forget, MD  12/30/2016 11:18 AM

## 2017-01-06 ENCOUNTER — Telehealth (HOSPITAL_COMMUNITY): Payer: Self-pay | Admitting: *Deleted

## 2017-01-06 NOTE — Telephone Encounter (Signed)
Called patient to verify message. She states she is still congested and coughing up clear to milky white. She still has some chest tightness. Denies fever. Finished Levaquin on 01/05/17. Currently on prednisone taper. Explained that I would talk with Dr. Whitney Muse and call her back with understanding verbalized.  After reviewing with Dr. Whitney Muse, I called patient back and instructed her to finished the prednisone as directed, use her albuterol inhaler as needed and obtain some OTC Mucinex to treat the symptoms. Patient verbalized understanding.

## 2017-01-14 ENCOUNTER — Encounter (HOSPITAL_COMMUNITY): Payer: Self-pay | Admitting: Oncology

## 2017-01-14 ENCOUNTER — Other Ambulatory Visit: Payer: Self-pay

## 2017-01-14 ENCOUNTER — Encounter (HOSPITAL_BASED_OUTPATIENT_CLINIC_OR_DEPARTMENT_OTHER): Payer: Medicaid Other | Admitting: Oncology

## 2017-01-14 VITALS — BP 131/75 | HR 95 | Temp 98.2°F | Resp 16 | Ht 60.0 in | Wt 115.5 lb

## 2017-01-14 DIAGNOSIS — C3411 Malignant neoplasm of upper lobe, right bronchus or lung: Secondary | ICD-10-CM

## 2017-01-14 DIAGNOSIS — R079 Chest pain, unspecified: Secondary | ICD-10-CM

## 2017-01-14 DIAGNOSIS — Z9221 Personal history of antineoplastic chemotherapy: Secondary | ICD-10-CM | POA: Diagnosis not present

## 2017-01-14 NOTE — Assessment & Plan Note (Addendum)
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  S/P concomitant chemoXRT with Carboplatin/Paclitaxel (09/20/2016- 10/25/2016) followed by 2 cycles of consolidative chemotherapy with Carboplatin/Paclitaxel (11/15/2016- 12/06/2016).  No role for labs today.  Given her chest pain, I have ordered an EKG.  EKG is NSR with occasional PVCs.  Due to her shortness of breath with exertion, she is given a handicapped placard form.   I have discussed starting immunotherapy, namely Imfinzi given recent data: In a phase III trial, over 700 patients with unresectable stage III NSCLC without progression after at least two cycles of platinum-based chemoradiotherapy were randomly assigned to the programmed death ligand 1 (PD-L1) antibody durvalumab or placebo in a 2:1 ratio. Relative to placebo, durvalumab increased the median PFS (16.8 versus 5.6 months; HR for disease progression or death 0.52, 95% CI 0.42-0.65), response rate (28 versus 16 percent; relative risk [RR] 1.78, 95% CI 1.27-2.51), and median time to death or distant metastasis (23.2 versus 14.6 months; HR 0.52, 95% CI 0.39-0.69). The benefit in PFS with durvalumab was observed irrespective of PD-L1 expression before chemoradiotherapy and was evident in both smokers and nonsmokers. OS results are immature. Grade 3 or 4 adverse events occurred in 30 percent of the patients who received durvalumab and 26 percent of those who received placebo, with the most common severe adverse event being pneumonia. These results demonstrate efficacy and tolerability of durvalumab for treatment of unresectable stage III NSCLC in patients who experience an objective response or stable disease following completion of chemoradiotherapy.  Will plan on starting therapy within the next 2 weeks.  She will return on day 1 cycle 1 or day 8 cycle 1 for follow-up.

## 2017-01-14 NOTE — Patient Instructions (Addendum)
[place] at [facility] Discharge Instructions  RECOMMENDATIONS MADE BY THE CONSULTANT AND ANY TEST RESULTS WILL BE SENT TO YOUR REFERRING PHYSICIAN.  You were seen today by Kirby Crigler PA-C. Handicap placard given today. EKG done today. Return as scheduled per Amy.   Thank you for choosing [place] at [facility] to provide your oncology and hematology care.  To afford each patient quality time with our provider, please arrive at least 15 minutes before your scheduled appointment time.    If you have a lab appointment with the [facility] please come in thru the  Main Entrance and check in at the main information desk  You need to re-schedule your appointment should you arrive 10 or more minutes late.  We strive to give you quality time with our providers, and arriving late affects you and other patients whose appointments are after yours.  Also, if you no show three or more times for appointments you may be dismissed from the clinic at the providers discretion.     Again, thank you for choosing [facility].  Our hope is that these requests will decrease the amount of time that you wait before being seen by our physicians.       _____________________________________________________________  Should you have questions after your visit to [facility], please contact our office at [phone] between the hours of 8:30 a.m. and 4:30 p.m.  Voicemails left after 4:30 p.m. will not be returned until the following business day.  For prescription refill requests, have your pharmacy contact our office.       Resources For Cancer Patients and their Caregivers ? American Cancer Society: Can assist with transportation, wigs, general needs, runs Look Good Feel Better.        401-591-2156 ? Cancer Care: Provides financial assistance, online support groups, medication/co-pay assistance.  1-800-813-HOPE 419-468-3932) ? [facility] Assists [place] cancer patients and their families through emotional , educational and financial support.  (914) 520-5481 ? [place] DSS Where to apply for food stamps, Medicaid and utility assistance. 650-114-0922 ? [facility]: Transportation to medical appointments. (713)002-0244 ? Social Security Administration: May apply for disability if have a Stage IV cancer. (985) 873-2500 (857)262-5291 ? [employer], Disability and Transit Services: Assists with nutrition, care and transit needs. [place] Support Programs: '@10RELATIVEDAYS'$ @ > Cancer Support Group  2nd Tuesday of the month 1pm-2pm, Journey Room  > Creative Journey  3rd Tuesday of the month 1130am-1pm, Journey Room  > Look Good Feel Better  1st Wednesday of the month 10am-12 noon, Journey Room (Call [place] to register [phone])

## 2017-01-14 NOTE — Progress Notes (Signed)
Emily Chroman, MD [name] [id number]  Primary cancer of right upper lobe of lung Emily [facility])  Chest pain, unspecified type - Plan: EKG 12-Lead  CURRENT THERAPY: Planning for immunotherapy.  INTERVAL HISTORY: Emily Pope 61 y.o. 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  S/P concomitant chemoXRT with Carboplatin/Paclitaxel (09/20/2016- 10/25/2016) followed by 2 cycles of consolidative chemotherapy with Carboplatin/Paclitaxel (11/15/2016- 12/06/2016).    Primary cancer of right upper lobe of lung (Emily Pope)   08/04/2016 PET scan    PET IMPRESSION: 1. 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 Mass is confluent with the right superior hilum and right lower tracheal wall, suggestive of a T4 primary tumor. 2. Postobstructive pneumonia in the right upper lobe. 3. Hypermetabolic ipsilateral and contralateral mediastinal and contralateral hilar lymphadenopathy, suggestive of N3 nodal disease.      08/24/2016 Procedure    Bronchoscopy      08/25/2016 Pathology Results    Diagnosis Endobronchial biopsy, RUL - POORLY DIFFERENTIATED NON-SMALL CELL CARCINOMA. The immunophenotype is consistent with poorly differentiated adenocarcinoma.      09/10/2016 Imaging    MRI brain- No metastatic disease or acute intracranial abnormality.      09/20/2016 - 10/25/2016 Chemotherapy    Carboplatin/Paclitaxel weekly x 6 with XRT.      09/20/2016 - 10/25/2016 Radiation Therapy         11/15/2016 - 12/06/2016 Chemotherapy    Carboplatin/paclitaxel every 21 days x 2 cycles.      12/30/2016 Imaging    CT CAP- 1. Central right upper lobe lung mass has mildly decreased since 11/21/2016 chest CT angiogram and significantly decreased  since 08/04/2016 PET-CT. 2. No pathologically enlarged thoracic lymph nodes. Previously described hypermetabolic thoracic adenopathy on the 08/04/2016 PET-CT has decreased in size. 3. Previously described bilateral lower lobe pulmonary nodules are stable in size. 4. New solitary subsolid 4 mm right middle lobe pulmonary nodule is nonspecific, and may be inflammatory. Recommend attention on a follow-up chest CT in 3 months. 5. No definite metastatic disease in the abdomen. Tiny sub 5 mm low-attenuation lesions in the liver are too small to characterize, favor benign. 6. Aortic atherosclerosis. One vessel coronary atherosclerosis. 7. Moderate emphysema.       She reports ongoing chest pain.  She notes a right sided and left sided chest pain with radiation to her middle back.  She notes that it comes and goes.  Does not occur with exertion.  Is not accompanied with shortness of breath.    She notes ongoing shortness of breath with exertion.  This is stable without progression.  Her O2 saturations are stable.  She notes dysphagia with foods.  Weight is stable.  She denies any issues with fluid consumption.  We discussed immunotherapy in her setting.  Review of Systems  Constitutional: Negative.  Negative for chills, fever and weight loss.  HENT: Negative.   Eyes: Negative.   Respiratory: Positive for shortness of breath. Negative for cough and sputum production.   Cardiovascular: Positive for chest pain. Negative for leg swelling.  Gastrointestinal: Negative.  Negative for constipation, diarrhea, nausea and vomiting.  Genitourinary: Negative.  Negative for dysuria.  Musculoskeletal: Negative.   Skin: Negative.   Neurological: Negative.  Negative for weakness.  Endo/Heme/Allergies: Negative.   Psychiatric/Behavioral: Negative.     Past  Medical History:  Diagnosis Date  . Anxiety    uses it for menopause   . Arthritis   . Cancer (Emily Pope)    Right Lung  . Chronic bronchitis  (Emily Pope)   . COPD (chronic obstructive pulmonary disease) (Emily Pope)   . GERD (gastroesophageal reflux disease)   . Headache    sinus  . Hypertension   . Pneumonia    07/22/16-had pneumonia in right lung and found mass in lung  . Primary cancer of right upper lobe of lung (Emily Pope) 08/23/2016    Past Surgical History:  Procedure Laterality Date  . ABDOMINAL HYSTERECTOMY    . APPENDECTOMY     when had hysterectomy  . ENDOBRONCHIAL ULTRASOUND Bilateral 08/23/2016   Procedure: ENDOBRONCHIAL ULTRASOUND;  Surgeon: Collene Gobble, MD;  Location: [facility];  Service: Cardiopulmonary;  Laterality: Bilateral;  . HERNIA REPAIR     left inguinal hernia age 64  . PORTACATH PLACEMENT Left 09/17/2016   Procedure: INSERTION PORT-A-CATH LEFT SUBCLAVIAN;  Surgeon: Aviva Signs, MD;  Location: [facility];  Service: General;  Laterality: Left;  . TUBAL LIGATION      Family History  Problem Relation Age of Onset  . Cancer Mother     lung  . Hypertension Mother   . Hypertension Father   . Diabetes Father   . Hypertension Sister   . Cancer Brother     lung  . Hypertension Sister     Social History   Social History  . Marital status: Divorced    Spouse name: N/A  . Number of children: N/A  . Years of education: N/A   Social History Main Topics  . Smoking status: Former Smoker    Packs/day: 2.00    Years: 43.00    Quit date: 07/20/2016  . Smokeless tobacco: Never Used  . Alcohol use No  . Drug use: No  . Sexual activity: Not Asked   Other Topics Concern  . None   Social History Narrative  . None     PHYSICAL EXAMINATION  ECOG PERFORMANCE STATUS: 1 - Symptomatic but completely ambulatory  Vitals:   01/14/17 1355  BP: 131/75  Pulse: 95  Resp: 16  Temp: 98.2 F (36.8 C)    GENERAL:alert, well nourished, well developed, comfortable, cooperative, smiling and unaccompanied SKIN: skin color, texture, turgor are normal, no rashes or significant lesions HEAD: Normocephalic, No masses,  lesions, tenderness or abnormalities EYES: normal, Conjunctiva are pink and non-injected EARS: External ears normal OROPHARYNX:lips, buccal mucosa, and tongue normal and mucous membranes are moist  NECK: supple, trachea midline LYMPH:  no palpable lymphadenopathy BREAST:not examined LUNGS: clear to auscultation and percussion HEART: regular rate & rhythm ABDOMEN:abdomen soft and normal bowel sounds BACK: Back symmetric, no curvature. EXTREMITIES:less then 2 second capillary refill, no joint deformities, effusion, or inflammation, no skin discoloration, no cyanosis  NEURO: alert & oriented x 3 with fluent speech, no focal motor/sensory deficits, gait normal   LABORATORY DATA: CBC    Component Value Date/Time   WBC 4.0 12/30/2016 1400   RBC 2.53 (L) 12/30/2016 1400   HGB 9.6 (L) 12/30/2016 1400   HGB 12.9 09/02/2016 1403   HCT 27.9 (L) 12/30/2016 1400   HCT 38.4 09/02/2016 1403   PLT 283 12/30/2016 1400   PLT 476 (H) 09/02/2016 1403   MCV 110.3 (H) 12/30/2016 1400   MCV 94.6 09/02/2016 1403   MCH 37.9 (H) 12/30/2016 1400   MCHC 34.4 12/30/2016 1400   RDW 18.1 (H) 12/30/2016 1400  RDW 13.6 09/02/2016 1403   LYMPHSABS 0.5 (L) 12/30/2016 1400   LYMPHSABS 2.5 09/02/2016 1403   MONOABS 0.2 12/30/2016 1400   MONOABS 0.7 09/02/2016 1403   EOSABS 0.1 12/30/2016 1400   EOSABS 0.7 (H) 09/02/2016 1403   BASOSABS 0.0 12/30/2016 1400   BASOSABS 0.1 09/02/2016 1403      Chemistry      Component Value Date/Time   NA 134 (L) 12/06/2016 0933   NA 140 09/02/2016 1403   K 4.0 12/06/2016 0933   K 4.2 09/02/2016 1403   CL 101 12/06/2016 0933   CO2 26 12/06/2016 0933   CO2 27 09/02/2016 1403   BUN 18 12/06/2016 0933   BUN 15.6 09/02/2016 1403   CREATININE 0.60 12/06/2016 0933   CREATININE 0.8 09/02/2016 1403      Component Value Date/Time   CALCIUM 9.2 12/06/2016 0933   CALCIUM 9.6 09/02/2016 1403   ALKPHOS 105 12/06/2016 0933   ALKPHOS 159 (H) 09/02/2016 1403   AST 14 (L)  12/06/2016 0933   AST 12 09/02/2016 1403   ALT 14 12/06/2016 0933   ALT <9 09/02/2016 1403   BILITOT 0.5 12/06/2016 0933   BILITOT <0.30 09/02/2016 1403        PENDING LABS:   RADIOGRAPHIC STUDIES:  Ct Chest W Contrast  Result Date: 12/30/2016 CLINICAL DATA:  Stage IIIb poorly differentiated primary bronchogenic adenocarcinoma of the right upper lobe diagnosed August 2017, completed radiation therapy 10/25/2016 with most recent chemotherapy 12/06/2016. Restaging. EXAM: CT CHEST AND ABDOMEN WITH CONTRAST TECHNIQUE: Multidetector CT imaging of the chest and abdomen was performed following the standard protocol during bolus administration of intravenous contrast. CONTRAST:  12m ISOVUE-300 IOPAMIDOL (ISOVUE-300) INJECTION 61% COMPARISON:  11/21/2016 chest CT angiogram.  08/04/2016 PET-CT . FINDINGS: CT CHEST FINDINGS Cardiovascular: Normal heart size. No significant pericardial fluid/thickening. Left subclavian MediPort terminates in the lower third of the superior vena cava. Left anterior descending coronary atherosclerosis. Atherosclerotic nonaneurysmal thoracic aorta. Normal caliber pulmonary arteries. No central pulmonary emboli. Mediastinum/Nodes: No discrete thyroid nodules. Unremarkable esophagus. No pathologically enlarged axillary, mediastinal or hilar lymph nodes. Previously described mildly enlarged hypermetabolic 1.2 cm right paratracheal node on 08/04/2016 PET-CT study is decreased to 0.6 cm. Previously described mildly enlarged hypermetabolic subcarinal 1.2 cm node has decreased to 0.7 cm. Lungs/Pleura: No pneumothorax. No pleural effusion. Moderate centrilobular emphysema with mild diffuse bronchial wall thickening. Central right upper lobe 4.9 x 3.7 cm lung mass (series 3/image 35) with amorphous internal calcifications, decreased from 5.2 x 4.0 cm on 11/21/2016 using similar measurement technique and 7.9 x 5.6 cm on 08/04/2016. Patchy reticulation and ground-glass opacity surrounding  of the right upper lobe lung mass is consistent with evolving postradiation change. New subsolid 4 mm peripheral right middle lobe nodule (series 3/image 76). Peripheral basilar right lower lobe 7 mm solid pulmonary nodule (series 3/image 114) is stable back to 08/04/2016. Subsolid left lower lobe 4 mm nodule (series 3/image 77) is stable back to 08/04/2016. No acute consolidative airspace disease or additional significant pulmonary nodules. Musculoskeletal: No aggressive appearing focal osseous lesions. Mild thoracic spondylosis. CT ABDOMEN FINDINGS Hepatobiliary: Normal liver size. Stable simple 0.9 cm right liver dome cyst. At least 5 additional sub 5 mm hypodense lesions scattered throughout the liver are too small to characterize, and for which accurate comparison cannot be made to the prior noncontrast PET-CT images. Normal gallbladder with no radiopaque cholelithiasis. No biliary ductal dilatation. Pancreas: Normal, with no mass or duct dilation. Spleen: Normal size. No mass. Adrenals/Urinary  Tract: Normal adrenals. Normal kidneys with no hydronephrosis and no renal mass. Stomach/Bowel: Grossly normal stomach. Visualized small and large bowel is normal caliber, with no bowel wall thickening. Vascular/Lymphatic: Atherosclerotic nonaneurysmal abdominal aorta. Patent portal, splenic, hepatic and renal veins. No pathologically enlarged lymph nodes in the abdomen. Other: No pneumoperitoneum, ascites or focal fluid collection. Musculoskeletal: No aggressive appearing focal osseous lesions. Mild lumbar spondylosis. IMPRESSION: 1. Central right upper lobe lung mass has mildly decreased since 11/21/2016 chest CT angiogram and significantly decreased since 08/04/2016 PET-CT. 2. No pathologically enlarged thoracic lymph nodes. Previously described hypermetabolic thoracic adenopathy on the 08/04/2016 PET-CT has decreased in size. 3. Previously described bilateral lower lobe pulmonary nodules are stable in size. 4. New  solitary subsolid 4 mm right middle lobe pulmonary nodule is nonspecific, and may be inflammatory. Recommend attention on a follow-up chest CT in 3 months. 5. No definite metastatic disease in the abdomen. Tiny sub 5 mm low-attenuation lesions in the liver are too small to characterize, favor benign. 6. Aortic atherosclerosis.  One vessel coronary atherosclerosis. 7. Moderate emphysema. Electronically Signed   By: Ilona Sorrel M.D.   On: 12/30/2016 08:41   Ct Abdomen W Contrast  Result Date: 12/30/2016 CLINICAL DATA:  Stage IIIb poorly differentiated primary bronchogenic adenocarcinoma of the right upper lobe diagnosed August 2017, completed radiation therapy 10/25/2016 with most recent chemotherapy 12/06/2016. Restaging. EXAM: CT CHEST AND ABDOMEN WITH CONTRAST TECHNIQUE: Multidetector CT imaging of the chest and abdomen was performed following the standard protocol during bolus administration of intravenous contrast. CONTRAST:  174m ISOVUE-300 IOPAMIDOL (ISOVUE-300) INJECTION 61% COMPARISON:  11/21/2016 chest CT angiogram.  08/04/2016 PET-CT . FINDINGS: CT CHEST FINDINGS Cardiovascular: Normal heart size. No significant pericardial fluid/thickening. Left subclavian MediPort terminates in the lower third of the superior vena cava. Left anterior descending coronary atherosclerosis. Atherosclerotic nonaneurysmal thoracic aorta. Normal caliber pulmonary arteries. No central pulmonary emboli. Mediastinum/Nodes: No discrete thyroid nodules. Unremarkable esophagus. No pathologically enlarged axillary, mediastinal or hilar lymph nodes. Previously described mildly enlarged hypermetabolic 1.2 cm right paratracheal node on 08/04/2016 PET-CT study is decreased to 0.6 cm. Previously described mildly enlarged hypermetabolic subcarinal 1.2 cm node has decreased to 0.7 cm. Lungs/Pleura: No pneumothorax. No pleural effusion. Moderate centrilobular emphysema with mild diffuse bronchial wall thickening. Central right upper lobe  4.9 x 3.7 cm lung mass (series 3/image 35) with amorphous internal calcifications, decreased from 5.2 x 4.0 cm on 11/21/2016 using similar measurement technique and 7.9 x 5.6 cm on 08/04/2016. Patchy reticulation and ground-glass opacity surrounding of the right upper lobe lung mass is consistent with evolving postradiation change. New subsolid 4 mm peripheral right middle lobe nodule (series 3/image 76). Peripheral basilar right lower lobe 7 mm solid pulmonary nodule (series 3/image 114) is stable back to 08/04/2016. Subsolid left lower lobe 4 mm nodule (series 3/image 77) is stable back to 08/04/2016. No acute consolidative airspace disease or additional significant pulmonary nodules. Musculoskeletal: No aggressive appearing focal osseous lesions. Mild thoracic spondylosis. CT ABDOMEN FINDINGS Hepatobiliary: Normal liver size. Stable simple 0.9 cm right liver dome cyst. At least 5 additional sub 5 mm hypodense lesions scattered throughout the liver are too small to characterize, and for which accurate comparison cannot be made to the prior noncontrast PET-CT images. Normal gallbladder with no radiopaque cholelithiasis. No biliary ductal dilatation. Pancreas: Normal, with no mass or duct dilation. Spleen: Normal size. No mass. Adrenals/Urinary Tract: Normal adrenals. Normal kidneys with no hydronephrosis and no renal mass. Stomach/Bowel: Grossly normal stomach. Visualized small and large  bowel is normal caliber, with no bowel wall thickening. Vascular/Lymphatic: Atherosclerotic nonaneurysmal abdominal aorta. Patent portal, splenic, hepatic and renal veins. No pathologically enlarged lymph nodes in the abdomen. Other: No pneumoperitoneum, ascites or focal fluid collection. Musculoskeletal: No aggressive appearing focal osseous lesions. Mild lumbar spondylosis. IMPRESSION: 1. Central right upper lobe lung mass has mildly decreased since 11/21/2016 chest CT angiogram and significantly decreased since 08/04/2016  PET-CT. 2. No pathologically enlarged thoracic lymph nodes. Previously described hypermetabolic thoracic adenopathy on the 08/04/2016 PET-CT has decreased in size. 3. Previously described bilateral lower lobe pulmonary nodules are stable in size. 4. New solitary subsolid 4 mm right middle lobe pulmonary nodule is nonspecific, and may be inflammatory. Recommend attention on a follow-up chest CT in 3 months. 5. No definite metastatic disease in the abdomen. Tiny sub 5 mm low-attenuation lesions in the liver are too small to characterize, favor benign. 6. Aortic atherosclerosis.  One vessel coronary atherosclerosis. 7. Moderate emphysema. Electronically Signed   By: Ilona Sorrel M.D.   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  S/P concomitant chemoXRT with Carboplatin/Paclitaxel (09/20/2016- 10/25/2016) followed by 2 cycles of consolidative chemotherapy with Carboplatin/Paclitaxel (11/15/2016- 12/06/2016).  No role for labs today.  Given her chest pain, I have ordered an EKG.  EKG is NSR with occasional PVCs.  Due to her shortness of breath with exertion, she is given a handicapped placard form.   I have discussed starting immunotherapy, namely Imfinzi given recent data: In a phase III trial, over 700 patients with unresectable stage III NSCLC without progression after at least Emily cycles of platinum-based chemoradiotherapy were randomly assigned to the programmed death ligand 1 (PD-L1) antibody durvalumab or placebo in a 2:1 ratio. Relative to placebo, durvalumab increased the median PFS (16.8 versus 5.6 months; HR for disease progression or death 0.52, 95% CI 0.42-0.65), response rate (28 versus 16 percent; relative risk [RR] 1.78, 95% CI 1.27-2.51), and median time to death or distant metastasis (23.2 versus 14.6  months; HR 0.52, 95% CI 0.39-0.69). The benefit in PFS with durvalumab was observed irrespective of PD-L1 expression before chemoradiotherapy and was evident in both smokers and nonsmokers. OS results are immature. Grade 3 or 4 adverse events occurred in 30 percent of the patients who received durvalumab and 26 percent of those who received placebo, with the most common severe adverse event being pneumonia. These results demonstrate efficacy and tolerability of durvalumab for treatment of unresectable stage III NSCLC in patients who experience an objective response or stable disease following completion of chemoradiotherapy.  Will plan on starting therapy within the next 2 weeks.  She will return on day 1 cycle 1 or day 8 cycle 1 for follow-up.   ORDERS PLACED FOR THIS ENCOUNTER: Orders Placed This Encounter  Procedures  . EKG 12-Lead    MEDICATIONS PRESCRIBED THIS ENCOUNTER: No orders of the defined types were placed in this encounter.   THERAPY PLAN:  Planning for durvalumab.  All questions were answered. The patient knows to call the clinic with any problems, questions or concerns. We can certainly see the patient much sooner if necessary.  Patient and plan discussed with Dr. Ancil Linsey and she is in agreement with the aforementioned.   This note is electronically signed by: Doy Mince 01/14/2017 4:32 PM

## 2017-01-20 NOTE — Patient Instructions (Signed)
No Name   CHEMOTHERAPY INSTRUCTIONS  You have Stage 3 lung Cancer.  We are going to treat you with Imfinzi.  This will be given every 3 weeks for the next year.  This infusion takes 1 hour to infuse.   You will see the doctor regularly throughout treatment.  We monitor your lab work prior to every treatment.  The doctor monitors your response to treatment by the way you are feeling, your blood work, and scans periodically.   POTENTIAL SIDE EFFECTS OF TREATMENT:  Durvalumab (Imfinzi)  About This Drug Durvalumab is used to treat cancer. This drug is given in the vein (IV).  Possible Side Effects . Tiredness . Muscle and bone pain . Constipation (not able to move bowels) . Decreased appetite (decreased hunger) . Nausea and throwing up (vomiting). . Swelling of your legs, ankles and/or feet . Urinary tract infection. Symptoms may include: . pain or burning when you pass urine . feeling like you have to pass urine often, but not much comes out when you do. . tender or heavy feeling in your lower abdomen . cloudy urine and/or urine that smells bad . pain on one side of your back under your ribs. This is where your kidneys are. . fever, chills, nausea and/or throwing up Note: Each of the side effects above was reported in 15% or greater of patients treated with durvalumab. Not all possible side effects are included above.  Warnings and Precautions . Inflammation (swelling) of the lungs. You may have a dry cough or trouble breathing. . Colitis which is swelling in the colon. The symptoms are loose bowel movements (diarrhea) stomach cramping, and sometimes blood in the bowel movements. . Changes in your liver function. . This drug may affect how your kidneys work . This drug may affect some of your hormone glands (especially the thyroid, adrenals, pituitary and pancreas). Your doctor may check your hormone levels as needed. . Increased risk of  infection. You may get a fever and chills. . While you are getting this drug in your vein (IV), you may have a reaction to the drug. Sometimes you may be given medication to stop or lessen these side effects. Your nurse will check you closely for these signs: fever or shaking chills, flushing, facial swelling, feeling dizzy, headache, trouble breathing, rash, itching, chest tightness, or chest pain. These reactions may happen for 24 hours after your infusion. If this happens, call 911 for emergency care.  Treating Side Effects . Manage tiredness by pacing your activities for the day. Be sure to include periods of rest between energy-draining activities . Keeping your pain under control is important to your well-being. Please tell your doctor or nurse if you are experiencing pain. . Ask your doctor or nurse about medicines that are available to help stop or lessen constipation. . If you are not able to move your bowels, check with your doctor or nurse before you use enemas, laxatives, or suppositories . Drink plenty of fluids (a minimum of eight glasses per day is recommended). . If you throw up or have loose bowel movements, you should drink more fluids so that you do not become dehydrated (lack water in the body from losing too much fluid). . To help with nausea and vomiting, eat small, frequent meals instead of three large meals a day. Choose foods and drinks that are at room temperature. Ask your nurse or doctor about other helpful tips and medicine that is available to  help or stop lessen these symptoms. . To help with decreased appetite, eat small, frequent meals . Eat high caloric food such as pudding, ice cream, yogurt and milkshakes. . Infusion reactions may happen for 24 hours after your infusion. If this happens, call 911 for emergency care.  Food and Drug Interactions . There are no known interactions of durvalumab with food. This drug may interact with other medicines. Tell your doctor  and pharmacist about all the medicines and dietary supplements (vitamins, minerals, herbs and others) that you are taking at this time. The safety and use of dietary supplements and alternative diets are often not known. Using these might affect your cancer or interfere with your treatment. Until more is known, you should not use dietary supplements or alternative diets without your cancer doctor's help.  When to Call the Doctor Call your doctor or nurse if you have any of these symptoms and/or any new or unusual symptoms: . Fever of 100.5 F (38 C) or higher . Chills, flushing . Wheezing, chest pain or trouble breathing . Facial swelling . Rash, itching or skin blistering . Rash that is not relieved by prescribed medicines . Pain when passing urine . Feeling dizzy or lightheaded . Confusion . Nausea that stops you from eating or drinking or is not relieved by prescribed medicine . No bowel movement for 3 days or you feel uncomfortable . Severe pain in the stomach area . Pain or burning when you pass urine. . Difficulty urinating . Feeling like you have to pass urine often, but not much comes out when you do. . Tender or heavy feeling in your lower abdomen. . Cloudy urine and/or urine that smells bad. . Pain on one side of your back under your ribs. This is where your kidneys are. . Decreased urine . Unusual thirst or passing urine often . Fatigue that interferes with your daily activities . Signs of liver problems: dark urine, pale bowel movements, bad stomach pain, feeling very tired and weak, unusual itching, or yellowing of the eyes or skin . Signs of infusion reaction: fever or shaking chills, flushing, facial swelling, feeling dizzy, headache, trouble breathing, rash, itching, chest tightness, or chest pain. . If you think you are pregnant  Reproduction Warnings . Pregnancy warning: This drug can have harmful effects on the unborn baby. It is recommended that effective methods of  birth control should be used by women of child-bearing age during cancer treatment and for at least 3 months after treatment. . Breast feeding warning: Women should not breast feed during treatment and for 3 month after treatment because this drug could enter the breast milk and cause harm to a breast feeding baby.   SELF CARE ACTIVITIES WHILE ON CHEMOTHERAPY: Hydration Increase your fluid intake 48 hours prior to treatment and drink at least 8 to 12 cups (64 ounces) of water/decaff beverages per day after treatment. You can still have your cup of coffee or soda but these beverages do not count as part of your 8 to 12 cups that you need to drink daily. No alcohol intake.  Medications Continue taking your normal prescription medication as prescribed.  If you start any new herbal or new supplements please let us know first to make sure it is safe.  Mouth Care Have teeth cleaned professionally before starting treatment. Keep dentures and partial plates clean. Use soft toothbrush and do not use mouthwashes that contain alcohol. Biotene is a good mouthwash that is available at most pharmacies or may  be ordered by calling [phone]. Use warm salt water gargles (1 teaspoon salt per 1 quart warm water) before and after meals and at bedtime. Or you may rinse with 2 tablespoons of three-percent hydrogen peroxide mixed in eight ounces of water. If you are still having problems with your mouth or sores in your mouth please call the clinic. If you need dental work, please let Dr. Whitney Muse know before you go for your appointment so that we can coordinate the best possible time for you in regards to your chemo regimen. You need to also let your dentist know that you are actively taking chemo. We may need to do labs prior to your dental appointment.   Skin Care Always use sunscreen that has not expired and with SPF (Sun Protection Factor) of 50 or higher. Wear hats to protect your head from the sun. Remember to  use sunscreen on your hands, ears, face, & feet.  Use good moisturizing lotions such as udder cream, eucerin, or even Vaseline. Some chemotherapies can cause dry skin, color changes in your skin and nails.    . Avoid long, hot showers or baths. . Use gentle, fragrance-free soaps and laundry detergent. . Use moisturizers, preferably creams or ointments rather than lotions because the thicker consistency is better at preventing skin dehydration. Apply the cream or ointment within 15 minutes of showering. Reapply moisturizer at night, and moisturize your hands every time after you wash them.  Hair Loss (if your doctor says your hair will fall out)  . If your doctor says that your hair is likely to fall out, decide before you begin chemo whether you want to wear a wig. You may want to shop before treatment to match your hair color. . Hats, turbans, and scarves can also camouflage hair loss, although some people prefer to leave their heads uncovered. If you go bare-headed outdoors, be sure to use sunscreen on your scalp. . Cut your hair short. It eases the inconvenience of shedding lots of hair, but it also can reduce the emotional impact of watching your hair fall out. . Don't perm or color your hair during chemotherapy. Those chemical treatments are already damaging to hair and can enhance hair loss. Once your chemo treatments are done and your hair has grown back, it's OK to resume dyeing or perming hair. With chemotherapy, hair loss is almost always temporary. But when it grows back, it may be a different color or texture. In older adults who still had hair color before chemotherapy, the new growth may be completely gray.  Often, new hair is very fine and soft.  Infection Prevention Please wash your hands for at least 30 seconds using warm soapy water. Handwashing is the #1 way to prevent the spread of germs. Stay away from sick people or people who are getting over a cold. If you develop respiratory  systems such as green/yellow mucus production or productive cough or persistent cough let us know and we will see if you need an antibiotic. It is a good idea to keep a pair of gloves on when going into grocery stores/[employer] to decrease your risk of coming into contact with germs on the carts, etc. Carry alcohol hand gel with you at all times and use it frequently if out in public. If your temperature reaches 100.5 or higher please call the clinic and let us know.  If it is after hours or on the weekend please go to the ER if your temperature is over  100.5.  Please have your own personal thermometer at home to use.    Sex and bodily fluids If you are going to have sex, a condom must be used to protect the person that isn't taking chemotherapy. Chemo can decrease your libido (sex drive). For a few days after chemotherapy, chemotherapy can be excreted through your bodily fluids.  When using the toilet please close the lid and flush the toilet twice.  Do this for a few day after you have had chemotherapy.     Effects of chemotherapy on your sex life Some changes are simple and won't last long. They won't affect your sex life permanently. Sometimes you may feel: . too tired . not strong enough to be very active . sick or sore  . not in the mood . anxious or low Your anxiety might not seem related to sex. For example, you may be worried about the cancer and how your treatment is going. Or you may be worried about money, or about how you family are coping with your illness. These things can cause stress, which can affect your interest in sex. It's important to talk to your partner about how you feel. Remember - the changes to your sex life don't usually last long. There's usually no medical reason to stop having sex during chemo. The drugs won't have any long term physical effects on your performance or enjoyment of sex. Cancer can't be passed on to your partner during sex  Contraception It's  important to use reliable contraception during treatment. Avoid getting pregnant while you or your partner are having chemotherapy. This is because the drugs may harm the baby. Sometimes chemotherapy drugs can leave a man or woman infertile.  This means you would not be able to have children in the future. You might want to talk to someone about permanent infertility. It can be very difficult to learn that you may no longer be able to have children. Some people find counselling helpful. There might be ways to preserve your fertility, although this is easier for men than for women. You may want to speak to a fertility expert. You can talk about sperm banking or harvesting your eggs. You can also ask about other fertility options, such as donor eggs. If you have or have had breast cancer, your doctor might advise you not to take the contraceptive pill. This is because the hormones in it might affect the cancer.  It is not known for sure whether or not chemotherapy drugs can be passed on through semen or secretions from the vagina. Because of this some doctors advise people to use a barrier method if you have sex during treatment. This applies to vaginal, anal or oral sex. Generally, doctors advise a barrier method only for the time you are actually having the treatment and for about a week after your treatment. Advice like this can be worrying, but this does not mean that you have to avoid being intimate with your partner. You can still have close contact with your partner and continue to enjoy sex.  Animals If you have cats or birds we just ask that you not change the litter or change the cage.  Please have someone else do this for you while you are on chemotherapy.   Food Safety During and After Cancer Treatment Food safety is important for people both during and after cancer treatment. Cancer and cancer treatments, such as chemotherapy, radiation therapy, and stem cell/bone marrow transplantation, often  weaken the  immune system. This makes it harder for your body to protect itself from foodborne illness, also called food poisoning. Foodborne illness is caused by eating food that contains harmful bacteria, parasites, or viruses.  Foods to avoid Some foods have a higher risk of becoming tainted with bacteria. These include: Marland Kitchen Unwashed fresh fruit and vegetables, especially leafy vegetables that can hide dirt and other contaminants . Raw sprouts, such as alfalfa sprouts . Raw or undercooked beef, especially ground beef, or other raw or undercooked meat and poultry . Fatty, fried, or spicy foods immediately before or after treatment.  These can sit heavy on your stomach and make you feel nauseous. . Raw or undercooked shellfish, such as oysters. . Sushi and sashimi, which often contain raw fish.  . Unpasteurized beverages, such as unpasteurized fruit juices, raw milk, raw yogurt, or cider . Undercooked eggs, such as soft boiled, over easy, and poached; raw, unpasteurized eggs; or foods made with raw egg, such as homemade raw cookie dough and homemade mayonnaise Simple steps for food safety Shop smart. . Do not buy food stored or displayed in an unclean area. . Do not buy bruised or damaged fruits or vegetables. . Do not buy cans that have cracks, dents, or bulges. . Pick up foods that can spoil at the end of your shopping trip and store them in a cooler on the way home. Prepare and clean up foods carefully. . Rinse all fresh fruits and vegetables under running water, and dry them with a clean towel or paper towel. . Clean the top of cans before opening them. . After preparing food, wash your hands for 20 seconds with hot water and soap. Pay special attention to areas between fingers and under nails. . Clean your utensils and dishes with hot water and soap. Marland Kitchen Disinfect your kitchen and cutting boards using 1 teaspoon of liquid, unscented bleach mixed into 1 quart of water.   Dispose of old  food. . Eat canned and packaged food before its expiration date (the "use by" or "best before" date). . Consume refrigerated leftovers within 3 to 4 days. After that time, throw out the food. Even if the food does not smell or look spoiled, it still may be unsafe. Some bacteria, such as Listeria, can grow even on foods stored in the refrigerator if they are kept for too long. Take precautions when eating out. . At restaurants, avoid buffets and salad bars where food sits out for a long time and comes in contact with many people. Food can become contaminated when someone with a virus, often a norovirus, or another "bug" handles it. . Put any leftover food in a "to-go" container yourself, rather than having the server do it. And, refrigerate leftovers as soon as you get home. . Choose restaurants that are clean and that are willing to prepare your food as you order it cooked.    MEDICATIONS:  Zofran/Ondansetron '8mg'$  tablet. Take 1 tablet every 8 hours as needed for nausea/vomiting. (#1 nausea med to take, this can constipate)  Compazine/Prochlorperazine '10mg'$  tablet. Take 1 tablet every 6 hours as needed for nausea/vomiting. (#2 nausea med to take, this can make you sleepy)   EMLA cream. Apply a quarter size amount to port site 1 hour prior to chemo. Do not rub in. Cover with plastic wrap.   Over-the-Counter Meds:  Miralax 1 capful in 8 oz of fluid daily. May increase to two times a day if needed. This is a stool softener. If this  doesn't work proceed you can add:  Senokot S-start with 1 tablet two times a day and increase to 4 tablets two times a day if needed. (total of 8 tablets in a 24 hour period). This is a stimulant laxative.   Call us if this does not help your bowels move.   Imodium '2mg'$  capsule. Take 2 capsules after the 1st loose stool and then 1 capsule every 2 hours until you go a total of 12 hours without having a loose stool. Call the [facility] if loose stools continue. If  diarrhea occurs @ bedtime, take 2 capsules @ bedtime. Then take 2 capsules every 4 hours until morning. Call [facility].     Diarrhea Sheet  If you are having loose stools/diarrhea, please purchase Imodium and begin taking as outlined:  At the first sign of poorly formed or loose stools you should begin taking Imodium(loperamide) 2 mg capsules.  Take two caplets ('4mg'$ ) followed by one caplet ('2mg'$ ) every 2 hours until you have had no diarrhea for 12 hours.  During the night take two caplets ('4mg'$ ) at bedtime and continue every 4 hours during the night until the morning.  Stop taking Imodium only after there is no sign of diarrhea for 12 hours.    Always call the [facility] if you are having loose stools/diarrhea that you can't get under control.  Loose stools/disrrhea leads to dehydration (loss of water) in your body.  We have other options of trying to get the loose stools/diarrhea to stopped but you must let us know!     Constipation Sheet *Miralax in 8 oz of fluid daily.  May increase to two times a day if needed.  This is a stool softener.  If this not enough to keep your bowel regular:  You can add:  *Senokot S, start with one tablet twice a day and can increase to 4 tablets twice a day if needed.  This is a stimulant laxative.   Sometimes when you take pain medication you need BOTH a medicine to keep your stool soft and a medicine to help your bowel push it out!  Please call if the above does not work for you.   Do not go more than 2 days without a bowel movement.  It is very important that you do not become constipated.  It will make you feel sick to your stomach (nausea) and can cause abdominal pain and vomiting.     Nausea Sheet  Zofran/Ondansetron '8mg'$  tablet. Take 1 tablet every 8 hours as needed for nausea/vomiting. (#1 nausea med to take, this can constipate)  Compazine/Prochlorperazine '10mg'$  tablet. Take 1 tablet every 6 hours as needed for nausea/vomiting. (#2 nausea  med to take, this can make you sleepy)  You can take these medications together or separately.  We would first like for you to try the Ondansetron by itself and then take the Prochloperizine if needed. But you are allowed to take both medications at the same time if your nausea is that severe.  If you are having persistent nausea (nausea that does not stop) please take these medications on a staggered schedule so that the nausea medication stays in your body.  Please call the [facility] and let us know the amount of nausea that you are experiencing.  If you begin to vomit, you need to call the [facility] and if it is the weekend and you have vomited more than one time and cant get it to stop-go to the  Emergency Room.  Persistent nausea/vomiting can lead to dehydration (loss of fluid in your body) and will make you feel terrible.   Ice chips, sips of clear liquids, foods that are @ room temperature, crackers, and toast tend to be better tolerated.    SYMPTOMS TO REPORT AS SOON AS POSSIBLE AFTER TREATMENT:  FEVER GREATER THAN 100.5 F  CHILLS WITH OR WITHOUT FEVER  NAUSEA AND VOMITING THAT IS NOT CONTROLLED WITH YOUR NAUSEA MEDICATION  UNUSUAL SHORTNESS OF BREATH  UNUSUAL BRUISING OR BLEEDING  TENDERNESS IN MOUTH AND THROAT WITH OR WITHOUT PRESENCE OF ULCERS  URINARY PROBLEMS  BOWEL PROBLEMS  UNUSUAL RASH    Wear comfortable clothing and clothing appropriate for easy access to any Portacath or PICC line. Let us know if there is anything that we can do to make your therapy better!    What to do if you need assistance after hours or on the weekends: CALL [phone].  HOLD on the line, do not hang up.  You will hear multiple messages but at the end you will be connected with a nurse triage line.  They will contact Dr Whitney Muse if necessary.  Most of the time they will be able to assist you.   Do not call the hospital operator.  Dr Whitney Muse will not answer phone calls received by  them.     I have been informed and understand all of the instructions given to me and have received a copy. I have been instructed to call the clinic [phone] or my family physician as soon as possible for continued medical care, if indicated. I do not have any more questions at this time but understand that I may call the [facility] or the Patient Navigator at [phone] during office hours should I have questions or need assistance in obtaining follow-up care.

## 2017-01-21 ENCOUNTER — Encounter (HOSPITAL_COMMUNITY): Payer: Self-pay | Admitting: Emergency Medicine

## 2017-01-21 ENCOUNTER — Ambulatory Visit (HOSPITAL_COMMUNITY): Payer: Medicaid Other

## 2017-01-21 MED ORDER — PREDNISONE 10 MG PO TABS: ORAL_TABLET | ORAL | 0 refills | Status: DC

## 2017-01-21 NOTE — Progress Notes (Signed)
Chemotherapy teaching pulled together and appts made. 

## 2017-01-24 ENCOUNTER — Other Ambulatory Visit (HOSPITAL_COMMUNITY): Payer: Self-pay | Admitting: Hematology & Oncology

## 2017-01-24 NOTE — Progress Notes (Signed)
DISCONTINUE ON PATHWAY REGIMEN - Non-Small Cell Lung  LOS23: Carboplatin AUC=6 + Paclitaxel 200 mg/m2 q21 Days x 2-3 Cycles   A cycle is every 21 days:     Paclitaxel (Taxol(R)) 200 mg/m2 in 500 mL NS IV over 3 hours followed by Dose Mod: None     Carboplatin (Paraplatin(R)) AUC=6 in 250 mL NS IV over 1 hour Dose Mod: None Additional Orders: * All AUC calculations intended to be used in [employer] formula  **Always confirm dose/schedule in your pharmacy ordering system**    REASON: Other Reason PRIOR TREATMENT: LOS23: Carboplatin AUC=6 + Paclitaxel 200 mg/m2 q21 Days x 2-3 Cycles TREATMENT RESPONSE: Partial Response (PR)  START OFF PATHWAY REGIMEN - Non-Small Cell Lung  Off Pathway: Durvalumab 10 mg/kg q14 Days  [id number]:Durvalumab 10 mg/kg q14 Days:   A cycle is every 14 days:     Durvalumab (Imfinzi(TM)) 10 mg/kg in 250 mL NS IV over 60 minutes on day 1 only of each cycle. Dose Mod: None Additional Orders: Monitor for infusion reactions; interrupt or slow infusion for grade 1 or 2 reaction occurs and consider premedication for subsequent infusion; discontinue for grade 3 or 4 infusion reaction.  See prescribing information for dose interruption guidelines and management of toxicity. Ref: [facility] [id number], 2016.  **Always confirm dose/schedule in your pharmacy ordering system**    Patient Characteristics: Stage III - Unresectable, PS = 0, 1 AJCC T Category: TX Current Disease Status: No Distant Mets or Local Recurrence AJCC N Category: NX AJCC M Category: Staged < 8th Ed. AJCC 8 Stage Grouping: IIIB Performance Status: PS = 0, 1  Intent of Therapy: Curative Intent, Discussed with Patient

## 2017-01-25 ENCOUNTER — Other Ambulatory Visit (HOSPITAL_COMMUNITY): Payer: Self-pay | Admitting: Oncology

## 2017-01-25 ENCOUNTER — Encounter (HOSPITAL_COMMUNITY): Payer: Medicaid Other | Attending: Oncology

## 2017-01-25 ENCOUNTER — Encounter (HOSPITAL_COMMUNITY): Payer: Self-pay | Admitting: Hematology & Oncology

## 2017-01-25 ENCOUNTER — Encounter (HOSPITAL_COMMUNITY): Payer: Medicaid Other

## 2017-01-25 VITALS — BP 146/76 | HR 93 | Temp 98.1°F | Resp 18

## 2017-01-25 DIAGNOSIS — Z5112 Encounter for antineoplastic immunotherapy: Secondary | ICD-10-CM

## 2017-01-25 DIAGNOSIS — C3411 Malignant neoplasm of upper lobe, right bronchus or lung: Secondary | ICD-10-CM

## 2017-01-25 LAB — COMPREHENSIVE METABOLIC PANEL
ALBUMIN: 3.7 g/dL (ref 3.5–5.0)
ALK PHOS: 80 U/L (ref 38–126)
ALT: 11 U/L — AB (ref 14–54)
AST: 14 U/L — AB (ref 15–41)
Anion gap: 9 (ref 5–15)
BILIRUBIN TOTAL: 0.4 mg/dL (ref 0.3–1.2)
BUN: 16 mg/dL (ref 6–20)
CALCIUM: 9 mg/dL (ref 8.9–10.3)
CO2: 26 mmol/L (ref 22–32)
CREATININE: 0.76 mg/dL (ref 0.44–1.00)
Chloride: 103 mmol/L (ref 101–111)
GFR calc Af Amer: >60 mL/min (ref >60)
GFR calc non Af Amer: >60 mL/min (ref >60)
GLUCOSE: 84 mg/dL (ref 65–99)
POTASSIUM: 4 mmol/L (ref 3.5–5.1)
Sodium: 138 mmol/L (ref 135–145)
TOTAL PROTEIN: 6.8 g/dL (ref 6.5–8.1)

## 2017-01-25 LAB — CBC WITH DIFFERENTIAL/PLATELET
BASOS ABS: 0 10*3/uL (ref 0.0–0.1)
BASOS PCT: 1 %
Eosinophils Absolute: 0.2 10*3/uL (ref 0.0–0.7)
Eosinophils Relative: 4 %
HEMATOCRIT: 32.7 % — AB (ref 36.0–46.0)
HEMOGLOBIN: 11.4 g/dL — AB (ref 12.0–15.0)
LYMPHS PCT: 11 %
Lymphs Abs: 0.6 10*3/uL — ABNORMAL LOW (ref 0.7–4.0)
MCH: 38.1 pg — ABNORMAL HIGH (ref 26.0–34.0)
MCHC: 34.9 g/dL (ref 30.0–36.0)
MCV: 109.4 fL — ABNORMAL HIGH (ref 78.0–100.0)
Monocytes Absolute: 0.3 10*3/uL (ref 0.1–1.0)
Monocytes Relative: 7 %
NEUTROS PCT: 77 %
Neutro Abs: 3.9 10*3/uL (ref 1.7–7.7)
Platelets: 362 10*3/uL (ref 150–400)
RBC: 2.99 MIL/uL — ABNORMAL LOW (ref 3.87–5.11)
RDW: 13.5 % (ref 11.5–15.5)
WBC: 5 10*3/uL (ref 4.0–10.5)

## 2017-01-25 MED ORDER — SODIUM CHLORIDE 0.9% FLUSH
10.0000 mL | INTRAVENOUS | Status: DC | PRN
  Administered 2017-01-25: 10 mL
  Filled 2017-01-25: qty 10

## 2017-01-25 MED ORDER — SODIUM CHLORIDE 0.9 % IV SOLN: Freq: Once | INTRAVENOUS | Status: DC

## 2017-01-25 MED ORDER — ONDANSETRON 8 MG PO TBDP
8.0000 mg | ORAL_TABLET | Freq: Once | ORAL | Status: AC
  Administered 2017-01-25: 8 mg via ORAL
  Filled 2017-01-25: qty 1

## 2017-01-25 MED ORDER — HEPARIN SOD (PORK) LOCK FLUSH 100 UNIT/ML IV SOLN
500.0000 [IU] | Freq: Once | INTRAVENOUS | Status: AC | PRN
  Administered 2017-01-25: 500 [IU]

## 2017-01-25 MED ORDER — SODIUM CHLORIDE 0.9 % IV SOLN
Freq: Once | INTRAVENOUS | Status: AC
Start: 2017-01-25 — End: 2017-01-25
  Administered 2017-01-25: 13:00:00 via INTRAVENOUS

## 2017-01-25 MED ORDER — DURVALUMAB 500 MG/10ML IV SOLN
9.6000 mg/kg | Freq: Once | INTRAVENOUS | Status: AC
  Administered 2017-01-25: 500 mg via INTRAVENOUS
  Filled 2017-01-25: qty 10

## 2017-01-25 NOTE — Progress Notes (Signed)
Emily Pope tolerated Imfinzi infusion well without complaints or incident. Labs reviewed prior to administering chemotherapy and Permit signed. Reviewed purpose and side effects of new medication and drug handout given to pt who verbalized understanding. VSS upon discharge. Pt discharged self ambulatory in satisfactory condition accompanied by her husband

## 2017-01-25 NOTE — Patient Instructions (Signed)
[facility] Discharge Instructions for Patients Receiving Chemotherapy   Beginning January 23rd 2017 lab work for the [facility] will be done in the  Main lab at [facility] on 1st floor. If you have a lab appointment with the [facility] please come in thru the  Main Entrance and check in at the main information desk   Today you received the following chemotherapy agents Imfinzi. Follow-up as scheduled. Call clinic for any questions or concerns  To help prevent nausea and vomiting after your treatment, we encourage you to take your nausea medication   If you develop nausea and vomiting, or diarrhea that is not controlled by your medication, call the clinic.  The clinic phone number is [phone]. Office hours are Monday-Friday 8:30am-5:00pm.  BELOW ARE SYMPTOMS THAT SHOULD BE REPORTED IMMEDIATELY:  *FEVER GREATER THAN 101.0 F  *CHILLS WITH OR WITHOUT FEVER  NAUSEA AND VOMITING THAT IS NOT CONTROLLED WITH YOUR NAUSEA MEDICATION  *UNUSUAL SHORTNESS OF BREATH  *UNUSUAL BRUISING OR BLEEDING  TENDERNESS IN MOUTH AND THROAT WITH OR WITHOUT PRESENCE OF ULCERS  *URINARY PROBLEMS  *BOWEL PROBLEMS  UNUSUAL RASH Items with * indicate a potential emergency and should be followed up as soon as possible. If you have an emergency after office hours please contact your primary care physician or go to the nearest emergency department.  Please call the clinic during office hours if you have any questions or concerns.   You may also contact the Patient Navigator at [phone] should you have any questions or need assistance in obtaining follow up care.      Resources For Cancer Patients and their Caregivers ? American Cancer Society: Can assist with transportation, wigs, general needs, runs Look Good Feel Better.        [phone] ? Cancer Care: Provides financial assistance, online support groups, medication/co-pay assistance.  1-800-813-HOPE  305-562-7817) ? [place] Assists [place] cancer patients and their families through emotional , educational and financial support.  6146714500 ? [place] DSS Where to apply for food stamps, Medicaid and utility assistance. 651-561-3409 ? RCATS: Transportation to medical appointments. [phone] ? Social Security Administration: May apply for disability if have a Stage IV cancer. [phone] 231-377-2845 ? [employer], Disability and Transit Services: Assists with nutrition, care and transit needs. [phone]

## 2017-01-25 NOTE — Progress Notes (Signed)
Went to check on pt during treatment.  She was doing well.  No questions at this time.  New schedule given.

## 2017-01-26 ENCOUNTER — Telehealth (HOSPITAL_COMMUNITY): Payer: Self-pay

## 2017-01-26 NOTE — Telephone Encounter (Signed)
See telephone encounter note.

## 2017-02-02 ENCOUNTER — Encounter (HOSPITAL_COMMUNITY): Payer: Medicaid Other

## 2017-02-02 ENCOUNTER — Ambulatory Visit (HOSPITAL_COMMUNITY): Payer: Medicaid Other | Admitting: Oncology

## 2017-02-02 ENCOUNTER — Encounter (HOSPITAL_COMMUNITY): Payer: Self-pay | Admitting: Oncology

## 2017-02-02 ENCOUNTER — Encounter (HOSPITAL_COMMUNITY): Payer: Medicaid Other | Attending: Oncology | Admitting: Oncology

## 2017-02-02 VITALS — BP 123/81 | HR 91 | Temp 97.8°F | Resp 16 | Wt 115.6 lb

## 2017-02-02 DIAGNOSIS — K219 Gastro-esophageal reflux disease without esophagitis: Secondary | ICD-10-CM

## 2017-02-02 DIAGNOSIS — Z9221 Personal history of antineoplastic chemotherapy: Secondary | ICD-10-CM | POA: Diagnosis not present

## 2017-02-02 DIAGNOSIS — C3411 Malignant neoplasm of upper lobe, right bronchus or lung: Secondary | ICD-10-CM

## 2017-02-02 DIAGNOSIS — I10 Essential (primary) hypertension: Secondary | ICD-10-CM | POA: Insufficient documentation

## 2017-02-02 DIAGNOSIS — Z87891 Personal history of nicotine dependence: Secondary | ICD-10-CM | POA: Diagnosis not present

## 2017-02-02 DIAGNOSIS — Z9851 Tubal ligation status: Secondary | ICD-10-CM | POA: Insufficient documentation

## 2017-02-02 DIAGNOSIS — J449 Chronic obstructive pulmonary disease, unspecified: Secondary | ICD-10-CM | POA: Diagnosis not present

## 2017-02-02 DIAGNOSIS — Z880 Allergy status to penicillin: Secondary | ICD-10-CM | POA: Diagnosis not present

## 2017-02-02 DIAGNOSIS — Z79899 Other long term (current) drug therapy: Secondary | ICD-10-CM | POA: Diagnosis not present

## 2017-02-02 DIAGNOSIS — R1310 Dysphagia, unspecified: Secondary | ICD-10-CM | POA: Insufficient documentation

## 2017-02-02 DIAGNOSIS — Z881 Allergy status to other antibiotic agents status: Secondary | ICD-10-CM | POA: Diagnosis not present

## 2017-02-02 DIAGNOSIS — Z923 Personal history of irradiation: Secondary | ICD-10-CM | POA: Insufficient documentation

## 2017-02-02 DIAGNOSIS — Z9889 Other specified postprocedural states: Secondary | ICD-10-CM | POA: Insufficient documentation

## 2017-02-02 DIAGNOSIS — Z90710 Acquired absence of both cervix and uterus: Secondary | ICD-10-CM | POA: Diagnosis not present

## 2017-02-02 DIAGNOSIS — Z801 Family history of malignant neoplasm of trachea, bronchus and lung: Secondary | ICD-10-CM | POA: Insufficient documentation

## 2017-02-02 DIAGNOSIS — M791 Myalgia: Secondary | ICD-10-CM

## 2017-02-02 DIAGNOSIS — Z833 Family history of diabetes mellitus: Secondary | ICD-10-CM | POA: Insufficient documentation

## 2017-02-02 LAB — COMPREHENSIVE METABOLIC PANEL
ALBUMIN: 3.9 g/dL (ref 3.5–5.0)
ALT: 11 U/L — ABNORMAL LOW (ref 14–54)
ANION GAP: 9 (ref 5–15)
AST: 16 U/L (ref 15–41)
Alkaline Phosphatase: 102 U/L (ref 38–126)
BUN: 15 mg/dL (ref 6–20)
CHLORIDE: 100 mmol/L — AB (ref 101–111)
CO2: 29 mmol/L (ref 22–32)
Calcium: 9.4 mg/dL (ref 8.9–10.3)
Creatinine, Ser: 0.86 mg/dL (ref 0.44–1.00)
GFR calc Af Amer: >60 mL/min (ref >60)
GFR calc non Af Amer: >60 mL/min (ref >60)
GLUCOSE: 83 mg/dL (ref 65–99)
POTASSIUM: 4.2 mmol/L (ref 3.5–5.1)
SODIUM: 138 mmol/L (ref 135–145)
Total Bilirubin: 0.4 mg/dL (ref 0.3–1.2)
Total Protein: 7.1 g/dL (ref 6.5–8.1)

## 2017-02-02 LAB — CBC WITH DIFFERENTIAL/PLATELET
BASOS ABS: 0 10*3/uL (ref 0.0–0.1)
Basophils Relative: 1 %
EOS ABS: 0.3 10*3/uL (ref 0.0–0.7)
Eosinophils Relative: 5 %
HCT: 35.8 % — ABNORMAL LOW (ref 36.0–46.0)
Hemoglobin: 12.2 g/dL (ref 12.0–15.0)
Lymphocytes Relative: 11 %
Lymphs Abs: 0.6 10*3/uL — ABNORMAL LOW (ref 0.7–4.0)
MCH: 37 pg — ABNORMAL HIGH (ref 26.0–34.0)
MCHC: 34.1 g/dL (ref 30.0–36.0)
MCV: 108.5 fL — ABNORMAL HIGH (ref 78.0–100.0)
MONO ABS: 0.4 10*3/uL (ref 0.1–1.0)
MONOS PCT: 8 %
NEUTROS ABS: 4.3 10*3/uL (ref 1.7–7.7)
NEUTROS PCT: 75 %
Platelets: 325 10*3/uL (ref 150–400)
RBC: 3.3 MIL/uL — ABNORMAL LOW (ref 3.87–5.11)
RDW: 12.6 % (ref 11.5–15.5)
WBC: 5.7 10*3/uL (ref 4.0–10.5)

## 2017-02-02 MED ORDER — MELOXICAM 15 MG PO TABS: 15.0000 mg | ORAL_TABLET | Freq: Every day | ORAL | 1 refills | Status: DC

## 2017-02-02 NOTE — Assessment & Plan Note (Addendum)
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  S/P concomitant chemoXRT with Carboplatin/Paclitaxel (09/20/2016- 10/25/2016) followed by 2 cycles of consolidative chemotherapy with Carboplatin/Paclitaxel (11/15/2016- 12/06/2016). Now on consolidative Durvalumab (Imfinzi) beginning on 01/25/2017 for up to 52 weeks.  Oncology history updated.  Labs today: CBC diff, CMET.  I personally reviewed and went over laboratory results with the patient.  The results are noted within this dictation.  She notes diffuse musculoskeletal pain that is improving but occurred approximately 72 hours posttreatment. I will give her prescription for meloxicam in the future to take daily on a when necessary basis. This medication is escribed.  This may be a side effect of Imfinzi.  Meloxicam can may help with her other discomfort complaints as well.  She wants to visit her son in [place] this summer.  She is free to do so and we will work treatment around this trip.  I will refer to GI for GERD-like symptoms and painful swallowing.  Return next week for cycle #2 of treatment. Pre-treatment labs as ordered.  Return in 3 weeks for cycle #3 and follow-up appointment.

## 2017-02-02 NOTE — Progress Notes (Signed)
[place] [id number]  Primary cancer of right upper lobe of lung ([place]) - Plan: TSH, meloxicam (MOBIC) 15 MG tablet  CURRENT THERAPY: Consolidative Durvalumab (Imfinzi) beginning on 01/25/2017.  INTERVAL HISTORY: Emily Pope 61 y.o. 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  S/P concomitant chemoXRT with Carboplatin/Paclitaxel (09/20/2016- 10/25/2016) followed by 2 cycles of consolidative chemotherapy with Carboplatin/Paclitaxel (11/15/2016- 12/06/2016).  Now on consolidative Durvalumab (Imfinzi) beginning on 01/25/2017 for up to 52 weeks.     Primary cancer of right upper lobe of lung ([place])   08/04/2016 PET scan    PET IMPRESSION: 1. 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 Mass is confluent with the right superior hilum and right lower tracheal wall, suggestive of a T4 primary tumor. 2. Postobstructive pneumonia in the right upper lobe. 3. Hypermetabolic ipsilateral and contralateral mediastinal and contralateral hilar lymphadenopathy, suggestive of N3 nodal disease.      08/24/2016 Procedure    Bronchoscopy      08/25/2016 Pathology Results    Diagnosis Endobronchial biopsy, RUL - POORLY DIFFERENTIATED NON-SMALL CELL CARCINOMA. The immunophenotype is consistent with poorly differentiated adenocarcinoma.      09/10/2016 Imaging    MRI brain- No metastatic disease or acute intracranial abnormality.      09/20/2016 - 10/25/2016 Chemotherapy    Carboplatin/Paclitaxel weekly x 6 with XRT.      09/20/2016 - 10/25/2016 Radiation Therapy         11/15/2016 - 12/06/2016 Chemotherapy    Carboplatin/paclitaxel every 21 days x 2 cycles.      12/30/2016 Imaging    CT CAP- 1. Central right upper  lobe lung mass has mildly decreased since 11/21/2016 chest CT angiogram and significantly decreased since 08/04/2016 PET-CT. 2. No pathologically enlarged thoracic lymph nodes. Previously described hypermetabolic thoracic adenopathy on the 08/04/2016 PET-CT has decreased in size. 3. Previously described bilateral lower lobe pulmonary nodules are stable in size. 4. New solitary subsolid 4 mm right middle lobe pulmonary nodule is nonspecific, and may be inflammatory. Recommend attention on a follow-up chest CT in 3 months. 5. No definite metastatic disease in the abdomen. Tiny sub 5 mm low-attenuation lesions in the liver are too small to characterize, favor benign. 6. Aortic atherosclerosis. One vessel coronary atherosclerosis. 7. Moderate emphysema.      01/25/2017 -  Chemotherapy    Durvalumab (Imfinzi) x 52 weeks.        She tolerated treatment reasonably well. I reviewed the side effects of Imfinzi. Your listed below: Side effects of Durvalumab: Peripheral edema, fatigue, skin rash, hyponatremia, constipation, decreased appetite, nausea, abdominal pain, colitis, diarrhea, tract infection, lymphocytopenia, infection, muscular skeletal pain, dyspnea, dyspnea on exertion, fever.  She reports diffuse musculoskeletal pain that started approximately 72 hours postinfusion. She notes that she took over-the-counter ibuprofen that was helpful. Today, she reports that this is much improved.  She notes a history of improvement in painful swallowing which was thought to be radiation-induced. However, this is now returned. She notes painful swallowing of foods. She also reports some symptoms associated with GERD. I'll refer her to GI she may need esophageal stretching and management of possible GERD. She reports intolerance to proton pump inhibitors as they "bind to me up."  She notes improvement in her shortness of breath.  She  continues to have shoulder blade discomfort in addition to upper  bilateral abdominal pain that radiates to her back.  Review of Systems  Constitutional: Negative.  Negative for chills, fever and weight loss.  HENT: Negative.   Eyes: Negative.   Respiratory: Negative for cough, sputum production and shortness of breath (improved).   Cardiovascular: Positive for chest pain (with food consumption). Negative for leg swelling.  Gastrointestinal: Negative.  Negative for constipation, diarrhea, nausea and vomiting.  Genitourinary: Negative.  Negative for dysuria.  Musculoskeletal: Negative.   Skin: Negative.   Neurological: Negative.  Negative for weakness.  Endo/Heme/Allergies: Negative.   Psychiatric/Behavioral: Negative.     Past Medical History:  Diagnosis Date  . Anxiety    uses it for menopause   . Arthritis   . Cancer ([place])    Right Lung  . Chronic bronchitis ([place])   . COPD (chronic obstructive pulmonary disease) ([place])   . GERD (gastroesophageal reflux disease)   . Headache    sinus  . Hypertension   . Pneumonia    07/22/16-had pneumonia in right lung and found mass in lung  . Primary cancer of right upper lobe of lung ([place]) 08/23/2016    Past Surgical History:  Procedure Laterality Date  . ABDOMINAL HYSTERECTOMY    . APPENDECTOMY     when had hysterectomy  . ENDOBRONCHIAL ULTRASOUND Bilateral 08/23/2016   Procedure: ENDOBRONCHIAL ULTRASOUND;  Surgeon: Collene Gobble, MD;  Location: [facility];  Service: Cardiopulmonary;  Laterality: Bilateral;  . HERNIA REPAIR     left inguinal hernia age 61  . PORTACATH PLACEMENT Left 09/17/2016   Procedure: INSERTION PORT-A-CATH LEFT SUBCLAVIAN;  Surgeon: Aviva Signs, MD;  Location: [facility];  Service: General;  Laterality: Left;  . TUBAL LIGATION      Family History  Problem Relation Age of Onset  . Cancer Mother     lung  . Hypertension Mother   . Hypertension Father   . Diabetes Father   . Hypertension Sister   . Cancer Brother     lung  . Hypertension Sister     Social History    Social History  . Marital status: Divorced    Spouse name: N/A  . Number of children: N/A  . Years of education: N/A   Social History Main Topics  . Smoking status: Former Smoker    Packs/day: 2.00    Years: 43.00    Quit date: 07/20/2016  . Smokeless tobacco: Never Used  . Alcohol use No  . Drug use: No  . Sexual activity: Not Asked   Other Topics Concern  . None   Social History Narrative  . None     PHYSICAL EXAMINATION  ECOG PERFORMANCE STATUS: 1 - Symptomatic but completely ambulatory  Vitals:   02/02/17 0933  BP: 123/81  Pulse: 91  Resp: 16  Temp: 97.8 F (36.6 C)    GENERAL:alert, well nourished, well developed, comfortable, cooperative, smiling and unaccompanied SKIN: skin color, texture, turgor are normal, no rashes or significant lesions HEAD: Normocephalic, No masses, lesions, tenderness or abnormalities EYES: normal, Conjunctiva are pink and non-injected EARS: External ears normal OROPHARYNX:lips, buccal mucosa, and tongue normal and mucous membranes are moist  NECK: supple, trachea midline LYMPH:  no palpable lymphadenopathy BREAST:not examined LUNGS: clear to auscultation and percussion HEART: regular rate & rhythm ABDOMEN:abdomen soft and normal bowel sounds BACK: Back symmetric, no curvature. EXTREMITIES:less then 2 second capillary refill, no joint deformities, effusion, or inflammation, no skin discoloration, no cyanosis  NEURO: alert & oriented x 3 with fluent speech, no focal motor/sensory deficits, gait normal   LABORATORY DATA: CBC    Component Value Date/Time   WBC 5.7 02/02/2017 0850   RBC 3.30 (L) 02/02/2017 0850   HGB 12.2 02/02/2017 0850   HGB 12.9 09/02/2016 1403   HCT 35.8 (L) 02/02/2017 0850   HCT 38.4 09/02/2016 1403   PLT 325 02/02/2017 0850   PLT 476 (H) 09/02/2016 1403   MCV 108.5 (H) 02/02/2017 0850   MCV 94.6 09/02/2016 1403   MCH 37.0 (H) 02/02/2017 0850   MCHC 34.1 02/02/2017 0850   RDW 12.6 02/02/2017 0850    RDW 13.6 09/02/2016 1403   LYMPHSABS 0.6 (L) 02/02/2017 0850   LYMPHSABS 2.5 09/02/2016 1403   MONOABS 0.4 02/02/2017 0850   MONOABS 0.7 09/02/2016 1403   EOSABS 0.3 02/02/2017 0850   EOSABS 0.7 (H) 09/02/2016 1403   BASOSABS 0.0 02/02/2017 0850   BASOSABS 0.1 09/02/2016 1403      Chemistry      Component Value Date/Time   NA 138 02/02/2017 0850   NA 140 09/02/2016 1403   K 4.2 02/02/2017 0850   K 4.2 09/02/2016 1403   CL 100 (L) 02/02/2017 0850   CO2 29 02/02/2017 0850   CO2 27 09/02/2016 1403   BUN 15 02/02/2017 0850   BUN 15.6 09/02/2016 1403   CREATININE 0.86 02/02/2017 0850   CREATININE 0.8 09/02/2016 1403      Component Value Date/Time   CALCIUM 9.4 02/02/2017 0850   CALCIUM 9.6 09/02/2016 1403   ALKPHOS 102 02/02/2017 0850   ALKPHOS 159 (H) 09/02/2016 1403   AST 16 02/02/2017 0850   AST 12 09/02/2016 1403   ALT 11 (L) 02/02/2017 0850   ALT <9 09/02/2016 1403   BILITOT 0.4 02/02/2017 0850   BILITOT <0.30 09/02/2016 1403        PENDING LABS:   RADIOGRAPHIC STUDIES:  No results found.   PATHOLOGY:    ASSESSMENT AND PLAN:  Primary cancer of right upper lobe of lung ([place]) Stage IIIB (T4N3M0) poorly differentiated carcinoma of lung with immunophenotyping consistent with adenocarcinoma.  S/P concomitant chemoXRT with Carboplatin/Paclitaxel (09/20/2016- 10/25/2016) followed by 2 cycles of consolidative chemotherapy with Carboplatin/Paclitaxel (11/15/2016- 12/06/2016). Now on consolidative Durvalumab (Imfinzi) beginning on 01/25/2017 for up to 52 weeks.  Oncology history updated.  Labs today: CBC diff, CMET.  I personally reviewed and went over laboratory results with the patient.  The results are noted within this dictation.  She notes diffuse musculoskeletal pain that is improving but occurred approximately 72 hours posttreatment. I will give her prescription for meloxicam in the future to take daily on a when necessary basis. This medication is escribed.   This may be a side effect of Imfinzi.  Meloxicam can may help with her other discomfort complaints as well.  She wants to visit her son in [place] this summer.  She is free to do so and we will work treatment around this trip.  I will refer to GI for GERD-like symptoms and painful swallowing.  Return next week for cycle #2 of treatment. Pre-treatment labs as ordered.  Return in 3 weeks for cycle #3 and follow-up appointment.   ORDERS PLACED FOR THIS ENCOUNTER: Orders Placed This Encounter  Procedures  . TSH    MEDICATIONS PRESCRIBED THIS ENCOUNTER: Meds ordered this encounter  Medications  . meloxicam (MOBIC) 15 MG tablet    Sig: Take 1 tablet (15 mg total) by mouth daily.  Dispense:  30 tablet    Refill:  1    Order Specific Question:   Supervising Provider    Answer:   Brunetta Genera [[id number]]    THERAPY PLAN:  Continue with Imfinzi.  All questions were answered. The patient knows to call the clinic with any problems, questions or concerns. We can certainly see the patient much sooner if necessary.  Patient and plan discussed with Dr. Twana First and she is in agreement with the aforementioned.   This note is electronically signed by: Doy Mince 02/02/2017 10:31 AM

## 2017-02-02 NOTE — Patient Instructions (Addendum)
[place] at [facility] Discharge Instructions  RECOMMENDATIONS MADE BY THE CONSULTANT AND ANY TEST RESULTS WILL BE SENT TO YOUR REFERRING PHYSICIAN.  You were seen today by Kirby Crigler, PA-C We will refer you to GI Follow up next week for treatment Follow up in 3 weeks for treatment and visit  Thank you for choosing [place] at [facility] to provide your oncology and hematology care.  To afford each patient quality time with our provider, please arrive at least 15 minutes before your scheduled appointment time.    If you have a lab appointment with the [facility] please come in thru the  Main Entrance and check in at the main information desk  You need to re-schedule your appointment should you arrive 10 or more minutes late.  We strive to give you quality time with our providers, and arriving late affects you and other patients whose appointments are after yours.  Also, if you no show three or more times for appointments you may be dismissed from the clinic at the providers discretion.     Again, thank you for choosing [facility].  Our hope is that these requests will decrease the amount of time that you wait before being seen by our physicians.       _____________________________________________________________  Should you have questions after your visit to [facility], please contact our office at [phone] between the hours of 8:30 a.m. and 4:30 p.m.  Voicemails left after 4:30 p.m. will not be returned until the following business day.  For prescription refill requests, have your pharmacy contact our office.       Resources For Cancer Patients and their Caregivers ? American Cancer Society: Can assist with transportation, wigs, general needs, runs Look Good Feel Better.        608-141-6196 ? Cancer Care: Provides financial assistance, online support groups, medication/co-pay assistance.   1-800-813-HOPE 7180103010) ? [facility] Assists [place] cancer patients and their families through emotional , educational and financial support.  608-243-0731 ? [place] DSS Where to apply for food stamps, Medicaid and utility assistance. [phone] [phone] ? [employer]: Transportation to medical appointments. (734)136-9201 ? Social Security Administration: May apply for disability if have a Stage IV cancer. 979-132-6337 503-398-3621 ? [employer], Disability and Transit Services: Assists with nutrition, care and transit needs. [place] Support Programs: '@10RELATIVEDAYS'$ @ > Cancer Support Group  2nd Tuesday of the month 1pm-2pm, Journey Room  > Creative Journey  3rd Tuesday of the month 1130am-1pm, Journey Room  > Look Good Feel Better  1st Wednesday of the month 10am-12 noon, Journey Room (Call [facility] to register [phone])

## 2017-02-03 ENCOUNTER — Other Ambulatory Visit (HOSPITAL_COMMUNITY): Payer: Medicaid Other

## 2017-02-03 ENCOUNTER — Ambulatory Visit (HOSPITAL_COMMUNITY): Payer: Medicaid Other | Admitting: Hematology & Oncology

## 2017-02-07 ENCOUNTER — Encounter: Payer: Self-pay | Admitting: Internal Medicine

## 2017-02-08 ENCOUNTER — Encounter (HOSPITAL_BASED_OUTPATIENT_CLINIC_OR_DEPARTMENT_OTHER): Payer: Medicaid Other

## 2017-02-08 VITALS — BP 127/85 | HR 80 | Temp 98.1°F | Resp 18 | Wt 116.0 lb

## 2017-02-08 DIAGNOSIS — Z5112 Encounter for antineoplastic immunotherapy: Secondary | ICD-10-CM

## 2017-02-08 DIAGNOSIS — C3411 Malignant neoplasm of upper lobe, right bronchus or lung: Secondary | ICD-10-CM | POA: Diagnosis not present

## 2017-02-08 LAB — COMPREHENSIVE METABOLIC PANEL
ALT: 11 U/L — ABNORMAL LOW (ref 14–54)
AST: 14 U/L — ABNORMAL LOW (ref 15–41)
Albumin: 3.7 g/dL (ref 3.5–5.0)
Alkaline Phosphatase: 80 U/L (ref 38–126)
Anion gap: 8 (ref 5–15)
BILIRUBIN TOTAL: 0.3 mg/dL (ref 0.3–1.2)
BUN: 15 mg/dL (ref 6–20)
CO2: 29 mmol/L (ref 22–32)
CREATININE: 0.71 mg/dL (ref 0.44–1.00)
Calcium: 9.1 mg/dL (ref 8.9–10.3)
Chloride: 102 mmol/L (ref 101–111)
Glucose, Bld: 81 mg/dL (ref 65–99)
Potassium: 4.3 mmol/L (ref 3.5–5.1)
Sodium: 139 mmol/L (ref 135–145)
TOTAL PROTEIN: 6.9 g/dL (ref 6.5–8.1)

## 2017-02-08 LAB — CBC WITH DIFFERENTIAL/PLATELET
Basophils Absolute: 0 10*3/uL (ref 0.0–0.1)
Basophils Relative: 0 %
EOS PCT: 4 %
Eosinophils Absolute: 0.2 10*3/uL (ref 0.0–0.7)
HCT: 33.5 % — ABNORMAL LOW (ref 36.0–46.0)
Hemoglobin: 11.1 g/dL — ABNORMAL LOW (ref 12.0–15.0)
LYMPHS ABS: 0.5 10*3/uL — AB (ref 0.7–4.0)
LYMPHS PCT: 10 %
MCH: 35.8 pg — AB (ref 26.0–34.0)
MCHC: 33.1 g/dL (ref 30.0–36.0)
MCV: 108.1 fL — AB (ref 78.0–100.0)
MONO ABS: 0.4 10*3/uL (ref 0.1–1.0)
Monocytes Relative: 8 %
Neutro Abs: 4 10*3/uL (ref 1.7–7.7)
Neutrophils Relative %: 78 %
PLATELETS: 332 10*3/uL (ref 150–400)
RBC: 3.1 MIL/uL — ABNORMAL LOW (ref 3.87–5.11)
RDW: 11.4 % — AB (ref 11.5–15.5)
WBC: 5.1 10*3/uL (ref 4.0–10.5)

## 2017-02-08 LAB — TSH: TSH: 2.892 u[IU]/mL (ref 0.350–4.500)

## 2017-02-08 MED ORDER — ONDANSETRON 8 MG PO TBDP
8.0000 mg | ORAL_TABLET | Freq: Once | ORAL | Status: AC
  Administered 2017-02-08: 8 mg via ORAL
  Filled 2017-02-08: qty 1

## 2017-02-08 MED ORDER — SODIUM CHLORIDE 0.9 % IV SOLN
9.6000 mg/kg | Freq: Once | INTRAVENOUS | Status: AC
  Administered 2017-02-08: 500 mg via INTRAVENOUS
  Filled 2017-02-08: qty 10

## 2017-02-08 MED ORDER — SODIUM CHLORIDE 0.9 % IV SOLN
Freq: Once | INTRAVENOUS | Status: AC
  Administered 2017-02-08: 11:00:00 via INTRAVENOUS

## 2017-02-08 MED ORDER — SODIUM CHLORIDE 0.9% FLUSH
10.0000 mL | INTRAVENOUS | Status: DC | PRN
  Administered 2017-02-08: 10 mL
  Filled 2017-02-08: qty 10

## 2017-02-08 MED ORDER — HEPARIN SOD (PORK) LOCK FLUSH 100 UNIT/ML IV SOLN
500.0000 [IU] | Freq: Once | INTRAVENOUS | Status: AC | PRN
  Administered 2017-02-08: 500 [IU]
  Filled 2017-02-08: qty 5

## 2017-02-08 NOTE — Progress Notes (Signed)
Emily Pope tolerated chemo tx well without complaints or incident.Labs reviewed prior to administering chemotherapy. VSS upon discharge. Pt discharged self ambulatory in satisfactory condition accompanied by her husband

## 2017-02-08 NOTE — Patient Instructions (Signed)
[facility] Discharge Instructions for Patients Receiving Chemotherapy   Beginning January 23rd 2017 lab work for the [facility] will be done in the  Main lab at [facility] on 1st floor. If you have a lab appointment with the [facility] please come in thru the  Main Entrance and check in at the main information desk   Today you received the following chemotherapy agents Imfinzi. Follow-up as scheduled. Call clinic for any questions or concerns  To help prevent nausea and vomiting after your treatment, we encourage you to take your nausea medication   If you develop nausea and vomiting, or diarrhea that is not controlled by your medication, call the clinic.  The clinic phone number is [phone]. Office hours are Monday-Friday 8:30am-5:00pm.  BELOW ARE SYMPTOMS THAT SHOULD BE REPORTED IMMEDIATELY:  *FEVER GREATER THAN 101.0 F  *CHILLS WITH OR WITHOUT FEVER  NAUSEA AND VOMITING THAT IS NOT CONTROLLED WITH YOUR NAUSEA MEDICATION  *UNUSUAL SHORTNESS OF BREATH  *UNUSUAL BRUISING OR BLEEDING  TENDERNESS IN MOUTH AND THROAT WITH OR WITHOUT PRESENCE OF ULCERS  *URINARY PROBLEMS  *BOWEL PROBLEMS  UNUSUAL RASH Items with * indicate a potential emergency and should be followed up as soon as possible. If you have an emergency after office hours please contact your primary care physician or go to the nearest emergency department.  Please call the clinic during office hours if you have any questions or concerns.   You may also contact the Patient Navigator at [phone] should you have any questions or need assistance in obtaining follow up care.      Resources For Cancer Patients and their Caregivers ? American Cancer Society: Can assist with transportation, wigs, general needs, runs Look Good Feel Better.        [phone] ? Cancer Care: Provides financial assistance, online support groups, medication/co-pay assistance.  1-800-813-HOPE  305-562-7817) ? [place] Assists [place] cancer patients and their families through emotional , educational and financial support.  6146714500 ? [place] DSS Where to apply for food stamps, Medicaid and utility assistance. 651-561-3409 ? RCATS: Transportation to medical appointments. [phone] ? Social Security Administration: May apply for disability if have a Stage IV cancer. [phone] 231-377-2845 ? [employer], Disability and Transit Services: Assists with nutrition, care and transit needs. [phone]

## 2017-02-15 ENCOUNTER — Ambulatory Visit (HOSPITAL_COMMUNITY): Payer: Medicaid Other | Admitting: Adult Health

## 2017-02-15 ENCOUNTER — Ambulatory Visit (HOSPITAL_COMMUNITY): Payer: Medicaid Other

## 2017-02-22 ENCOUNTER — Encounter (HOSPITAL_COMMUNITY): Payer: Self-pay | Admitting: Adult Health

## 2017-02-22 ENCOUNTER — Encounter (HOSPITAL_BASED_OUTPATIENT_CLINIC_OR_DEPARTMENT_OTHER): Payer: Medicaid Other

## 2017-02-22 ENCOUNTER — Encounter (HOSPITAL_BASED_OUTPATIENT_CLINIC_OR_DEPARTMENT_OTHER): Payer: Medicaid Other | Admitting: Adult Health

## 2017-02-22 VITALS — BP 118/94 | HR 100 | Temp 98.1°F | Resp 18 | Wt 115.4 lb

## 2017-02-22 VITALS — BP 130/64 | HR 96 | Temp 98.5°F | Resp 18

## 2017-02-22 DIAGNOSIS — Z5112 Encounter for antineoplastic immunotherapy: Secondary | ICD-10-CM | POA: Diagnosis present

## 2017-02-22 DIAGNOSIS — M25519 Pain in unspecified shoulder: Secondary | ICD-10-CM

## 2017-02-22 DIAGNOSIS — R1310 Dysphagia, unspecified: Secondary | ICD-10-CM

## 2017-02-22 DIAGNOSIS — M791 Myalgia: Secondary | ICD-10-CM

## 2017-02-22 DIAGNOSIS — C3411 Malignant neoplasm of upper lobe, right bronchus or lung: Secondary | ICD-10-CM

## 2017-02-22 LAB — COMPREHENSIVE METABOLIC PANEL
ALT: 10 U/L — AB (ref 14–54)
AST: 15 U/L (ref 15–41)
Albumin: 4 g/dL (ref 3.5–5.0)
Alkaline Phosphatase: 92 U/L (ref 38–126)
Anion gap: 8 (ref 5–15)
BUN: 12 mg/dL (ref 6–20)
CHLORIDE: 101 mmol/L (ref 101–111)
CO2: 29 mmol/L (ref 22–32)
Calcium: 9.4 mg/dL (ref 8.9–10.3)
Creatinine, Ser: 0.8 mg/dL (ref 0.44–1.00)
GFR calc Af Amer: >60 mL/min (ref >60)
Glucose, Bld: 101 mg/dL — ABNORMAL HIGH (ref 65–99)
POTASSIUM: 3.9 mmol/L (ref 3.5–5.1)
SODIUM: 138 mmol/L (ref 135–145)
Total Bilirubin: 0.3 mg/dL (ref 0.3–1.2)
Total Protein: 7.7 g/dL (ref 6.5–8.1)

## 2017-02-22 LAB — CBC WITH DIFFERENTIAL/PLATELET
Basophils Absolute: 0 K/uL (ref 0.0–0.1)
Basophils Relative: 0 %
Eosinophils Absolute: 0.4 K/uL (ref 0.0–0.7)
Eosinophils Relative: 6 %
HCT: 36.4 % (ref 36.0–46.0)
Hemoglobin: 12.4 g/dL (ref 12.0–15.0)
Lymphocytes Relative: 13 %
Lymphs Abs: 0.9 K/uL (ref 0.7–4.0)
MCH: 35.3 pg — ABNORMAL HIGH (ref 26.0–34.0)
MCHC: 34.1 g/dL (ref 30.0–36.0)
MCV: 103.7 fL — ABNORMAL HIGH (ref 78.0–100.0)
Monocytes Absolute: 0.6 K/uL (ref 0.1–1.0)
Monocytes Relative: 9 %
Neutro Abs: 4.9 K/uL (ref 1.7–7.7)
Neutrophils Relative %: 72 %
Platelets: 325 K/uL (ref 150–400)
RBC: 3.51 MIL/uL — ABNORMAL LOW (ref 3.87–5.11)
RDW: 12.1 % (ref 11.5–15.5)
WBC: 6.8 K/uL (ref 4.0–10.5)

## 2017-02-22 MED ORDER — SODIUM CHLORIDE 0.9 % IV SOLN
Freq: Once | INTRAVENOUS | Status: AC
  Administered 2017-02-22: 12:00:00 via INTRAVENOUS

## 2017-02-22 MED ORDER — HEPARIN SOD (PORK) LOCK FLUSH 100 UNIT/ML IV SOLN
500.0000 [IU] | Freq: Once | INTRAVENOUS | Status: AC | PRN
  Administered 2017-02-22: 500 [IU]

## 2017-02-22 MED ORDER — MAGIC MOUTHWASH W/LIDOCAINE: 5.0000 mL | Freq: Four times a day (QID) | ORAL | 0 refills | Status: DC | PRN

## 2017-02-22 MED ORDER — HEPARIN SOD (PORK) LOCK FLUSH 100 UNIT/ML IV SOLN
INTRAVENOUS | Status: AC
  Filled 2017-02-22: qty 5

## 2017-02-22 MED ORDER — DURVALUMAB 500 MG/10ML IV SOLN
9.5000 mg/kg | Freq: Once | INTRAVENOUS | Status: AC
  Administered 2017-02-22: 500 mg via INTRAVENOUS
  Filled 2017-02-22: qty 10

## 2017-02-22 MED ORDER — SODIUM CHLORIDE 0.9% FLUSH
10.0000 mL | INTRAVENOUS | Status: DC | PRN
  Administered 2017-02-22: 10 mL
  Filled 2017-02-22: qty 10

## 2017-02-22 MED ORDER — ONDANSETRON 8 MG PO TBDP
8.0000 mg | ORAL_TABLET | Freq: Once | ORAL | Status: AC
  Administered 2017-02-22: 8 mg via ORAL

## 2017-02-22 MED ORDER — TRAMADOL HCL 50 MG PO TABS: 50.0000 mg | ORAL_TABLET | Freq: Four times a day (QID) | ORAL | 0 refills | Status: DC | PRN

## 2017-02-22 MED ORDER — ONDANSETRON 8 MG PO TBDP
ORAL_TABLET | ORAL | Status: AC
  Filled 2017-02-22: qty 1

## 2017-02-22 NOTE — Progress Notes (Signed)
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 Mass is confluent with the right superior hilum and right lower tracheal wall, suggestive of a T4 primary tumor. 2. Postobstructive pneumonia in the right upper lobe. 3. Hypermetabolic ipsilateral and contralateral mediastinal and contralateral hilar lymphadenopathy, suggestive of N3 nodal disease.      08/24/2016 Procedure    Bronchoscopy      08/25/2016 Pathology Results    Diagnosis Endobronchial biopsy, RUL - POORLY DIFFERENTIATED NON-SMALL CELL CARCINOMA. The immunophenotype is consistent with poorly differentiated adenocarcinoma.      09/10/2016 Imaging    MRI brain- No metastatic disease or acute intracranial abnormality.      09/20/2016 - 10/25/2016 Chemotherapy    Carboplatin/Paclitaxel weekly x 6 with XRT.      09/20/2016 - 10/25/2016 Radiation Therapy         11/15/2016 - 12/06/2016 Chemotherapy    Carboplatin/paclitaxel every 21 days x 2 cycles.      12/30/2016 Imaging    CT CAP- 1. Central right upper lobe lung mass has mildly decreased since 11/21/2016 chest CT angiogram and significantly decreased since 08/04/2016 PET-CT. 2. No pathologically enlarged thoracic lymph nodes. Previously described hypermetabolic thoracic adenopathy on the 08/04/2016 PET-CT has decreased in size. 3. Previously described bilateral lower lobe pulmonary  nodules are stable in size. 4. New solitary subsolid 4 mm right middle lobe pulmonary nodule is nonspecific, and may be inflammatory. Recommend attention on a follow-up chest CT in 3 months. 5. No definite metastatic disease in the abdomen. Tiny sub 5 mm low-attenuation lesions in the liver are too small to characterize, favor benign. 6. Aortic atherosclerosis. One vessel coronary atherosclerosis. 7. Moderate emphysema.      01/25/2017 -  Chemotherapy    Durvalumab (Imfinzi) x 52 weeks.         HISTORY OF PRESENT ILLNESS:  (From Kirby Crigler, PA-C's last note on 02/02/17)    INTERVAL HISTORY:  Ms. Emily Pope 61 y.o. female returns for routine follow-up and consideration for next cycle of Imfinzi. She is here with her husband today.   She tells me her energy levels "come and go; I have 1 good day and 2 bad days sometimes."  Overall, she feels better on the Imfinzi than she did on chemotherapy.  Denies any diarrhea, rash, or new shortness of breath.    Biggest complaint today is generalized arthralgias and myalgias. Pain is worst in her shoulders, arms, and hips; describes the pain as "achy."  She has decreased range of motion to her arms; "I can't even take my T-shirt off over my head."  She takes Tramadol 1-2 times/day, which helps make the pain more manageable. She prefers Tramadol "because it doesn't make me feel woozy headed."  She tried taking the Mobic for a few days, but did not notice a difference so she stopped taking it. She also endorses pain with swallowing any solid foods except oatmeal. Denies choking  sensation, but states, "I feel my food going down (points to chest) and it just hurts."  Denies any reflux symptoms.  She does have an appt for GI evaluation on 02/24/17.  Her weight has been stable.  Bowels are moving regularly and denies any blood in stools or melena.  Endorses occasional nausea; managed with anti-emetics. Denies vomiting.   She is using "nicotine tabs" about 3  times/day (upon further description, it sounds like she is using nicotine lozenges). Denies any current tobacco use.    REVIEW OF SYSTEMS:  Review of Systems  Constitutional: Positive for fatigue. Negative for chills and fever.  HENT:  Negative.   Eyes: Negative.   Respiratory: Negative.  Negative for cough and shortness of breath.   Cardiovascular: Negative.  Negative for chest pain and leg swelling.  Gastrointestinal: Positive for nausea. Negative for abdominal pain, blood in stool, constipation, diarrhea and vomiting.  Endocrine: Negative.   Genitourinary: Negative.  Negative for dysuria and hematuria.   Musculoskeletal: Positive for arthralgias and myalgias.  Skin: Negative.  Negative for rash.  Neurological: Negative.  Negative for dizziness and headaches.  Hematological: Negative.  Negative for adenopathy. Does not bruise/bleed easily.  Psychiatric/Behavioral: Negative.      PAST MEDICAL/SURGICAL HISTORY:  Past Medical History:  Diagnosis Date  . Anxiety    uses it for menopause   . Arthritis   . Cancer ([facility] [place])    Right Lung  . Chronic bronchitis ([place])   . COPD (chronic obstructive pulmonary disease) ([facility])   . GERD (gastroesophageal reflux disease)   . Headache    sinus  . Hypertension   . Pneumonia    07/22/16-had pneumonia in right lung and found mass in lung  . Primary cancer of right upper lobe of lung ([facility]) 08/23/2016   Past Surgical History:  Procedure Laterality Date  . ABDOMINAL HYSTERECTOMY    . APPENDECTOMY     when had hysterectomy  . ENDOBRONCHIAL ULTRASOUND Bilateral 08/23/2016   Procedure: ENDOBRONCHIAL ULTRASOUND;  Surgeon: Collene Gobble, MD;  Location: [facility];  Service: Cardiopulmonary;  Laterality: Bilateral;  . HERNIA REPAIR     left inguinal hernia age 89  . PORTACATH PLACEMENT Left 09/17/2016   Procedure: INSERTION PORT-A-CATH LEFT SUBCLAVIAN;  Surgeon: Aviva Signs, MD;  Location: [facility];  Service: General;  Laterality: Left;  . TUBAL  LIGATION       SOCIAL HISTORY:  Social History   Social History  . Marital status: Divorced    Spouse name: N/A  . Number of children: N/A  . Years of education: N/A   Occupational History  . Not on file.   Social History Main Topics  . Smoking status: Former Smoker    Packs/day: 2.00    Years: 43.00    Quit date: 07/20/2016  . Smokeless tobacco: Never Used  . Alcohol use No  . Drug use: No  . Sexual activity: Not on file   Other Topics Concern  . Not on file   Social History Narrative  . No narrative on file    FAMILY HISTORY:  Family History  Problem Relation Age of Onset  . Cancer Mother     lung  . Hypertension Mother   . Hypertension Father   . Diabetes Father   . Hypertension Sister   . Cancer Brother     lung  . Hypertension Sister     CURRENT MEDICATIONS:  Outpatient Encounter Prescriptions as of 02/22/2017  Medication Sig  . albuterol (PROVENTIL HFA;VENTOLIN  HFA) 108 (90 Base) MCG/ACT inhaler Inhale 2 puffs into the lungs every 6 (six) hours as needed for wheezing or shortness of breath.  Marland Kitchen buPROPion (WELLBUTRIN XL) 150 MG 24 hr tablet Take 150 mg by mouth daily.  . diphenhydrAMINE (BENADRYL) 25 MG tablet Take 12.5 mg by mouth daily as needed for allergies.  [place]is (IMFINZI IV) Inject into the vein.  Marland Kitchen esomeprazole (NEXIUM) 20 MG capsule Take 20 mg by mouth daily at 12 noon.  Marland Kitchen HYDROcodone-acetaminophen (NORCO/VICODIN) 5-325 MG tablet Take 1 tablet by mouth every 6 (six) hours as needed for moderate pain.  Marland Kitchen lidocaine-prilocaine (EMLA) cream Apply 1 application topically as needed.  Marland Kitchen lisinopril-hydrochlorothiazide (PRINZIDE,ZESTORETIC) 20-12.5 MG tablet Take 1 tablet by mouth daily.  Marland Kitchen loratadine (CLARITIN) 10 MG tablet Take 10 mg by mouth daily.  . magic mouthwash w/lidocaine SOLN 1 part of each: Benadryl 12.'5mg'$ /80m, Viscous lidocaine 2% and Maalox. Swish and swallow 5 ml QID  . meloxicam (MOBIC) 15 MG tablet Take 1 tablet (15 mg total) by  mouth daily.  . naproxen (NAPROSYN) 500 MG tablet Take 1 tablet (500 mg total) by mouth 2 (two) times daily with a meal.  . ondansetron (ZOFRAN) 8 MG tablet Take 1 tablet (8 mg total) by mouth 2 (two) times daily as needed for refractory nausea / vomiting. Start on day 3 after chemo.  . predniSONE (DELTASONE) 10 MG tablet Take 2 tablets x 5 days, take 1 tablet x 3 days, take 1 tablet every other day x 3 days.  . predniSONE (DELTASONE) 10 MG tablet Take at the onset of diarrhea.  Take 5 tablets (50 mg) at once.  Then call the [facility]  . prochlorperazine (COMPAZINE) 10 MG tablet Take 1 tablet (10 mg total) by mouth every 6 (six) hours as needed (Nausea or vomiting).  . traMADol (ULTRAM) 50 MG tablet Take 1 tablet (50 mg total) by mouth every 6 (six) hours as needed for moderate pain.  . [DISCONTINUED] traMADol (ULTRAM) 50 MG tablet Take 1 tablet (50 mg total) by mouth every 6 (six) hours as needed for moderate pain.  . magic mouthwash w/lidocaine SOLN Take 5 mLs by mouth 4 (four) times daily as needed (for pain with swallowing).   No facility-administered encounter medications on file as of 02/22/2017.     ALLERGIES:  Allergies  Allergen Reactions  . Clindamycin/Lincomycin Rash  . Codeine Anaphylaxis  . Penicillins Anaphylaxis  . Vancomycin Itching    Scalp itching  . Sulfa Antibiotics Nausea Only  . Lincomycin Rash    Also Clindamycin as it has lincomycin in it     PHYSICAL EXAM:  ECOG Performance status: 1-2 - Symptomatic, requires occasional assistance but largely independent.   Vitals:   02/22/17 1044  BP: (!) 118/94  Pulse: 100  Resp: 18  Temp: 98.1 F (36.7 C)   Filed Weights   02/22/17 1044  Weight: 115 lb 6.4 oz (52.3 kg)    Physical Exam  Constitutional: She is oriented to person, place, and time and well-developed, well-nourished, and in no distress.  HENT:  Head: Normocephalic.  Mouth/Throat: Oropharynx is clear and moist. No oropharyngeal exudate.    Eyes: Conjunctivae are normal. Pupils are equal, round, and reactive to light. No scleral icterus.  Neck: Normal range of motion. Neck supple.  Cardiovascular: Normal rate, regular rhythm and normal heart sounds.   Pulmonary/Chest: Effort normal and breath sounds normal. No respiratory distress.  Abdominal: Soft. Bowel sounds are normal. There is no tenderness.  Musculoskeletal: She exhibits no edema.  Decreased ROM to bilateral shoulders, particularly with extension above head.   Lymphadenopathy:    She has no cervical adenopathy.       Right: No supraclavicular adenopathy present.       Left: No supraclavicular adenopathy present.  Neurological: She is alert and oriented to person, place, and time. No cranial nerve deficit. Gait normal.  Skin: Skin is warm and dry. No rash noted.  Psychiatric: Mood, memory, affect and judgment normal.  Nursing note and vitals reviewed.    LABORATORY DATA:  I have reviewed the labs as listed.  CBC    Component Value Date/Time   WBC 6.8 02/22/2017 1052   RBC 3.51 (L) 02/22/2017 1052   HGB 12.4 02/22/2017 1052   HGB 12.9 09/02/2016 1403   HCT 36.4 02/22/2017 1052   HCT 38.4 09/02/2016 1403   PLT 325 02/22/2017 1052   PLT 476 (H) 09/02/2016 1403   MCV 103.7 (H) 02/22/2017 1052   MCV 94.6 09/02/2016 1403   MCH 35.3 (H) 02/22/2017 1052   MCHC 34.1 02/22/2017 1052   RDW 12.1 02/22/2017 1052   RDW 13.6 09/02/2016 1403   LYMPHSABS 0.9 02/22/2017 1052   LYMPHSABS 2.5 09/02/2016 1403   MONOABS 0.6 02/22/2017 1052   MONOABS 0.7 09/02/2016 1403   EOSABS 0.4 02/22/2017 1052   EOSABS 0.7 (H) 09/02/2016 1403   BASOSABS 0.0 02/22/2017 1052   BASOSABS 0.1 09/02/2016 1403   CMP Latest Ref Rng & Units 02/22/2017 02/08/2017 02/02/2017  Glucose 65 - 99 mg/dL 101(H) 81 83  BUN 6 - 20 mg/dL '12 15 15  '$ Creatinine 0.44 - 1.00 mg/dL 0.80 0.71 0.86  Sodium 135 - 145 mmol/L 138 139 138  Potassium 3.5 - 5.1 mmol/L 3.9 4.3 4.2  Chloride 101 - 111 mmol/L 101 102  100(L)  CO2 22 - 32 mmol/L '29 29 29  '$ Calcium 8.9 - 10.3 mg/dL 9.4 9.1 9.4  Total Protein 6.5 - 8.1 g/dL 7.7 6.9 7.1  Total Bilirubin 0.3 - 1.2 mg/dL 0.3 0.3 0.4  Alkaline Phos 38 - 126 U/L 92 80 102  AST 15 - 41 U/L 15 14(L) 16  ALT 14 - 54 U/L 10(L) 11(L) 11(L)    PENDING LABS:    DIAGNOSTIC IMAGING:  CT chest/abd/pelvis: 12/29/16      PATHOLOGY:  RUL lung path: 08/23/16     ASSESSMENT & PLAN:   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  Now on Darvalumab (Imfinzi) therapy every 2 weeks starting 01/25/17. -Labs reviewed and are adequate for cycle #3 Imfinzi today.  -CT chest due in 03/2017 for continued surveillance according to NCCN Guidelines; orders placed today.  [Of note: Surveillance recommendations for Stage IIIB is the same for stages I & II per NCCN Guidelines].    NCCN Guidelines reviewed for continued surveillance & Survivorship for Lung Cancer:  Surveillance Guidelines for NSCLC:         Arthalgias/Myalgias:  -Likely multifactorial in a patient with previous arthritis, s/p chemotherapy, and now being treated with immunotherapy.  -Discussed referral to PT for arthalgias and decreased shoulder ROM; she declines at this time; encouraged her to let us know if she changed her mind and we would be happy to place referral.   -Recommended she restart Mobic for the next 2 weeks to help reduce joint inflammation.   -She was given samples  for Nexium today to take along with the Mobic.  -[place] Controlled Substance Reporting Registry reviewed and Tramadol refill appropriate. Tramadol 50 mg po Q8Hprn, #60, no refills.     Dysphagia:  -Likely secondary to previous concurrent chemoradiation; could have esophageal stricture requiring dilation. -Prescription for Magic Mouthwash (1 part Maalox, 1 part Benadryl, 1  part Lidocaine) given to patient; she could swish and swallow this mixture to help her with meals. No oral mucositis or mouth pain, only dysphagia.   -Recommended she maintain consultation with GI scheduled for 02/24/17.  -Samples given for Nexium once daily, as reflux could be playing a role in her reported dysphagia.     Dispo:  -Return to cancer center in 2 weeks for consideration of next cycle Imfinzi.  -CT chest due in 03/2017; orders placed today.    All questions were answered to patient's stated satisfaction. Encouraged patient to call with any new concerns or questions before her next visit to the cancer center and we can certain see her sooner, if needed.      Orders placed this encounter:  Orders Placed This Encounter  Procedures  . CT Chest W Contrast      Mike Craze, NP [place] [phone]

## 2017-02-22 NOTE — Patient Instructions (Signed)
[facility] Discharge Instructions for Patients Receiving Chemotherapy   Beginning January 23rd 2017 lab work for the Cancer Center will be done in the  Main lab at  on 1st floor. If you have a lab appointment with the Cancer Center please come in thru the  Main Entrance and check in at the main information desk   Today you received the following chemotherapy agents   To help prevent nausea and vomiting after your treatment, we encourage you to take your nausea medication     If you develop nausea and vomiting, or diarrhea that is not controlled by your medication, call the clinic.  The clinic phone number is [phone]. Office hours are Monday-Friday 8:30am-5:00pm.  BELOW ARE SYMPTOMS THAT SHOULD BE REPORTED IMMEDIATELY:  *FEVER GREATER THAN 101.0 F  *CHILLS WITH OR WITHOUT FEVER  NAUSEA AND VOMITING THAT IS NOT CONTROLLED WITH YOUR NAUSEA MEDICATION  *UNUSUAL SHORTNESS OF BREATH  *UNUSUAL BRUISING OR BLEEDING  TENDERNESS IN MOUTH AND THROAT WITH OR WITHOUT PRESENCE OF ULCERS  *URINARY PROBLEMS  *BOWEL PROBLEMS  UNUSUAL RASH Items with * indicate a potential emergency and should be followed up as soon as possible. If you have an emergency after office hours please contact your primary care physician or go to the nearest emergency department.  Please call the clinic during office hours if you have any questions or concerns.   You may also contact the Patient Navigator at [phone] should you have any questions or need assistance in obtaining follow up care.      Resources For Cancer Patients and their Caregivers ? American Cancer Society: Can assist with transportation, wigs, general needs, runs Look Good Feel Better.        1-888-227-6333 ? Cancer Care: Provides financial assistance, online support groups, medication/co-pay assistance.  1-800-813-HOPE (4673) ? [facility] Assists [place] cancer  patients and their families through emotional , educational and financial support.  336-427-4357 ? [place] DSS Where to apply for food stamps, Medicaid and utility assistance. 336-342-1394 ? [facility]: Transportation to medical appointments. 336-347-2287 ? Social Security Administration: May apply for disability if have a Stage IV cancer. 336-342-7796 1-800-772-1213 ? [place] Aging, Disability and Transit Services: Assists with nutrition, care and transit needs. 336-349-2343         

## 2017-02-22 NOTE — Patient Instructions (Signed)
[place] at [facility] Discharge Instructions  RECOMMENDATIONS MADE BY THE CONSULTANT AND ANY TEST RESULTS WILL BE SENT TO YOUR REFERRING PHYSICIAN.  Exam with Mike Craze, NP. Return as scheduled for follow up and treatment.    Thank you for choosing [place] at [facility] to provide your oncology and hematology care.  To afford each patient quality time with our provider, please arrive at least 15 minutes before your scheduled appointment time.    If you have a lab appointment with the [facility] please come in thru the  Main Entrance and check in at the main information desk  You need to re-schedule your appointment should you arrive 10 or more minutes late.  We strive to give you quality time with our providers, and arriving late affects you and other patients whose appointments are after yours.  Also, if you no show three or more times for appointments you may be dismissed from the clinic at the providers discretion.     Again, thank you for choosing [facility].  Our hope is that these requests will decrease the amount of time that you wait before being seen by our physicians.       _____________________________________________________________  Should you have questions after your visit to [facility], please contact our office at [phone] between the hours of 8:30 a.m. and 4:30 p.m.  Voicemails left after 4:30 p.m. will not be returned until the following business day.  For prescription refill requests, have your pharmacy contact our office.       Resources For Cancer Patients and their Caregivers ? American Cancer Society: Can assist with transportation, wigs, general needs, runs Look Good Feel Better.        914 056 4650 ? Cancer Care: Provides financial assistance, online support groups, medication/co-pay assistance.  1-800-813-HOPE (782)723-5733) ? [facility] Assists  [place] cancer patients and their families through emotional , educational and financial support.  2174342979 ? [place] DSS Where to apply for food stamps, Medicaid and utility assistance. 3105331009 ? [facility]: Transportation to medical appointments. 253 026 6540 ? Social Security Administration: May apply for disability if have a Stage IV cancer. (458) 350-7946 787 740 6357 ? [employer], Disability and Transit Services: Assists with nutrition, care and transit needs. [place] Support Programs: '@10RELATIVEDAYS'$ @ > Cancer Support Group  2nd Tuesday of the month 1pm-2pm, Journey Room  > Creative Journey  3rd Tuesday of the month 1130am-1pm, Journey Room  > Look Good Feel Better  1st Wednesday of the month 10am-12 noon, Journey Room (Call [place] to register [phone])

## 2017-02-22 NOTE — Progress Notes (Signed)
Chemotherapy given today per orders. Patient tolerated it well. Vitals stable and discharged home from clinic with husband ambulatory. Follow up as scheduled.

## 2017-02-24 ENCOUNTER — Ambulatory Visit (INDEPENDENT_AMBULATORY_CARE_PROVIDER_SITE_OTHER): Payer: Medicaid Other | Admitting: Nurse Practitioner

## 2017-02-24 ENCOUNTER — Encounter: Payer: Self-pay | Admitting: Nurse Practitioner

## 2017-02-24 VITALS — BP 125/80 | HR 93 | Temp 97.8°F | Ht 60.0 in | Wt 115.6 lb

## 2017-02-24 DIAGNOSIS — K219 Gastro-esophageal reflux disease without esophagitis: Secondary | ICD-10-CM

## 2017-02-24 DIAGNOSIS — K208 Other esophagitis without bleeding: Secondary | ICD-10-CM

## 2017-02-24 DIAGNOSIS — T66XXXA Radiation sickness, unspecified, initial encounter: Secondary | ICD-10-CM

## 2017-02-24 MED ORDER — LIDOCAINE VISCOUS 2 % MT SOLN: 10.0000 mL | Freq: Four times a day (QID) | OROMUCOSAL | 1 refills | Status: DC | PRN

## 2017-02-24 NOTE — Progress Notes (Signed)
CC'ED TO PCP 

## 2017-02-24 NOTE — Assessment & Plan Note (Signed)
Odynophagia without dysphagia described as "like him swallowing symptoms intubate, it just hurts going down." This occurs with solid foods. No issues with liquids or oatmeal. Was previously reluctant to start PPI, however was started on this 2 days ago through Nexium 20 mg daily. Had tried Carafate without improvement. Magic mouthwash helped numb the area due to viscous lidocaine, but does not think she needs to Benadryl and possibly the Maalox and it. At this point I will recommend she continue PPI, I will send in a prescription for viscous lidocaine to use for breakthrough. Return for follow-up in 2 months and if she continues to be symptomatic we can consider upper endoscopy for further evaluation.

## 2017-02-24 NOTE — Progress Notes (Signed)
Primary Care Physician:  [name], MD Primary Gastroenterologist:  Dr. Gala Romney  Chief Complaint  Patient presents with  . Gastroesophageal Reflux    belches a lot  . Dysphagia    painful swallowing    HPI:   Emily Pope is a 61 y.o. female who presents on referral from oncology for GERD and painful swallowing. She is currently being seen by oncology for lung cancer. She was previously having odynophagia during radiation treatment and was thought to be radiation induced, it improved, has now returned as of oncology appointment to 07/15/2017. Also noted some GERD symptoms. Notes intolerance to proton pump inhibitors as a "bind me up."  Today she states she's doing ok. Her lung cancer "got to down in half" with radiation and chemotherapy. Is now on immunotherapy to see if that helps so she doesn't have to go back on chemo. She previously had 33 radiation treatments which irritated her esophagus. This resolved after treatment (December 2017). However, about a month ago this returned. She was just started on Nexium 2 days ago; was reluctant to take because they "tend to constipate her." She is going to try PPI and manage the constipation if it because a problem. She is also noted to be lactose intolerant. Odynophagia occurs every time, startes at where her collarbone is and goes down to the level of her breast, described a "not just discomfort but pain, like I'm swallowing something too big. Liquids and oatmeal don't bother me." Better today than it was last week. Miracle mouthwash helps numb things as well. Denies other abdominal pain. Occasional nausea from immunotherapy but not vomiting. Denies dysphagia or regurgitation. Denies hematochezia or melena. Still with chest pain and dyspnea from chemo and radiation but this is improving recently since stopping chemotherapy. Denies any other upper or lower GI symptoms.  Past Medical History:  Diagnosis Date  . Anxiety    uses it for menopause    . Arthritis   . Cancer ([place])    Right Lung  . Chronic bronchitis ([place])   . COPD (chronic obstructive pulmonary disease) ([place])   . GERD (gastroesophageal reflux disease)   . Headache    sinus  . Hypertension   . Pneumonia    07/22/16-had pneumonia in right lung and found mass in lung  . Primary cancer of right upper lobe of lung ([place]) 08/23/2016    Past Surgical History:  Procedure Laterality Date  . ABDOMINAL HYSTERECTOMY    . APPENDECTOMY     when had hysterectomy  . CARPAL TUNNEL RELEASE Bilateral   . ENDOBRONCHIAL ULTRASOUND Bilateral 08/23/2016   Procedure: ENDOBRONCHIAL ULTRASOUND;  Surgeon: Collene Gobble, MD;  Location: [facility];  Service: Cardiopulmonary;  Laterality: Bilateral;  . HERNIA REPAIR     left inguinal hernia age 28  . PORTACATH PLACEMENT Left 09/17/2016   Procedure: INSERTION PORT-A-CATH LEFT SUBCLAVIAN;  Surgeon: Aviva Signs, MD;  Location: [facility];  Service: General;  Laterality: Left;  . TUBAL LIGATION      Current Outpatient Prescriptions  Medication Sig Dispense Refill  . albuterol (PROVENTIL HFA;VENTOLIN HFA) 108 (90 Base) MCG/ACT inhaler Inhale 2 puffs into the lungs every 6 (six) hours as needed for wheezing or shortness of breath. 1 Inhaler 6  . buPROPion (WELLBUTRIN XL) 150 MG 24 hr tablet Take 150 mg by mouth daily.    . diphenhydrAMINE (BENADRYL) 25 MG tablet Take 12.5 mg by mouth daily as needed for allergies.    Hunt Oris (IMFINZI IV)  Inject into the vein.    Marland Kitchen esomeprazole (NEXIUM) 20 MG capsule Take 20 mg by mouth daily at 12 noon.    . lidocaine-prilocaine (EMLA) cream Apply 1 application topically as needed. 30 g 3  . lisinopril-hydrochlorothiazide (PRINZIDE,ZESTORETIC) 20-12.5 MG tablet Take 1 tablet by mouth as needed.  30 tablet 0  . magic mouthwash w/lidocaine SOLN 1 part of each: Benadryl 12.'5mg'$ /5ZW, Viscous lidocaine 2% and Maalox. Swish and swallow 5 ml QID 360 mL 1  . magic mouthwash w/lidocaine SOLN Take 5 mLs by mouth 4  (four) times daily as needed (for pain with swallowing). 250 mL 0  . meloxicam (MOBIC) 15 MG tablet Take 1 tablet (15 mg total) by mouth daily. 30 tablet 1  . ondansetron (ZOFRAN) 8 MG tablet Take 1 tablet (8 mg total) by mouth 2 (two) times daily as needed for refractory nausea / vomiting. Start on day 3 after chemo. 30 tablet 1  . OVER THE COUNTER MEDICATION Nicotine mini tabs as needed    . traMADol (ULTRAM) 50 MG tablet Take 1 tablet (50 mg total) by mouth every 6 (six) hours as needed for moderate pain. 60 tablet 0  . vitamin B-12 (CYANOCOBALAMIN) 1000 MCG tablet Take 1,000 mcg by mouth daily.     No current facility-administered medications for this visit.     Allergies as of 02/24/2017 - Review Complete 02/24/2017  Allergen Reaction Noted  . Clindamycin/lincomycin Rash 09/14/2016  . Codeine Anaphylaxis 07/26/2016  . Penicillins Anaphylaxis 07/26/2016  . Vancomycin Itching 09/27/2016  . Sulfa antibiotics Nausea Only 07/26/2016  . Lincomycin Rash 07/26/2016    Family History  Problem Relation Age of Onset  . Cancer Mother     lung  . Hypertension Mother   . Hypertension Father   . Diabetes Father   . Hypertension Sister   . Cancer Brother     lung  . Hypertension Sister   . Colon cancer Neg Hx     Social History   Social History  . Marital status: Divorced    Spouse name: N/A  . Number of children: N/A  . Years of education: N/A   Occupational History  . Not on file.   Social History Main Topics  . Smoking status: Former Smoker    Packs/day: 2.00    Years: 43.00    Quit date: 07/20/2016  . Smokeless tobacco: Never Used  . Alcohol use No  . Drug use: No  . Sexual activity: Not on file   Other Topics Concern  . Not on file   Social History Narrative  . No narrative on file    Review of Systems: General: Negative for anorexia, weight loss, fever, chills, fatigue, weakness. ENT: Negative for hoarseness, difficulty swallowing. Admits odynophagia. CV:  Negative for chest pain, angina, palpitations, peripheral edema.  Respiratory: Negative for dyspnea at rest, cough, sputum, wheezing.  GI: See history of present illness. MS: Admits some aches and pains, improving.  Derm: Negative for rash or itching.  Endo: Negative for unusual weight change.  Heme: Negative for bruising or bleeding. Allergy: Negative for rash or hives.    Physical Exam: BP 125/80   Pulse 93   Temp 97.8 F (36.6 C) (Oral)   Ht 5' (1.524 m)   Wt 115 lb 9.6 oz (52.4 kg)   LMP 08/04/1985 (Approximate) Comment: hysterectomy @ 61 years old  BMI 22.58 kg/m  General:   Thin female. Alert and oriented. Pleasant and cooperative. Well-nourished and well-developed.  Head:  Normocephalic and atraumatic. Wearing a beanie cap due to hair loss. Eyes:  Without icterus, sclera clear and conjunctiva pink.  Ears:  Normal auditory acuity. Cardiovascular:  S1, S2 present without murmurs appreciated. Extremities without clubbing or edema. Respiratory:  Clear to auscultation bilaterally. No wheezes, rales, or rhonchi. No distress.  Gastrointestinal:  +BS, soft, non-tender and non-distended. No HSM noted. No guarding or rebound. No masses appreciated.  Rectal:  Deferred  Musculoskalatal:  Symmetrical without gross deformities. Neurologic:  Alert and oriented x4;  grossly normal neurologically. Psych:  Alert and cooperative. Normal mood and affect. Heme/Lymph/Immune: No excessive bruising noted.    02/24/2017 8:36 AM   Disclaimer: This note was dictated with voice recognition software. Similar sounding words can inadvertently be transcribed and may not be corrected upon review.

## 2017-02-24 NOTE — Assessment & Plan Note (Signed)
Reflux symptoms previously which have now returned. She just started Nexium 2 days ago and feels like it is helping. I will also send an viscous lidocaine for odynophagia as noted above. No dysphagia currently. Return for follow-up in 2 months.

## 2017-02-24 NOTE — Patient Instructions (Signed)
1. Continue taking Nexium. 2. I sent in a prescription for viscous lidocaine to your pharmacy. You can take 10 MLS every 6 hours as needed for esophagus pain. 3. Return for follow-up in 2 months. 4. Call us if your symptoms become worse or severe.

## 2017-03-08 ENCOUNTER — Ambulatory Visit (HOSPITAL_COMMUNITY): Payer: Medicaid Other | Admitting: Adult Health

## 2017-03-08 ENCOUNTER — Ambulatory Visit (HOSPITAL_COMMUNITY): Payer: Medicaid Other

## 2017-03-10 NOTE — Progress Notes (Signed)
Emily Chroman, MD [place] [id number]  Primary cancer of right upper lobe of lung ([place])  Gastroesophageal reflux disease without esophagitis - Plan: esomeprazole (NEXIUM) 20 MG capsule  Cough - Plan: benzonatate (TESSALON) 200 MG capsule  CURRENT THERAPY: Consolidative Durvalumab (Imfinzi) beginning on 01/25/2017.  INTERVAL HISTORY: Emily Pope 61 y.o. 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  S/P concomitant chemoXRT with Carboplatin/Paclitaxel (09/20/2016- 10/25/2016) followed by 2 cycles of consolidative chemotherapy with Carboplatin/Paclitaxel (11/15/2016- 12/06/2016).  Now on consolidative Durvalumab (Imfinzi) beginning on 01/25/2017 for up to 52 weeks.     Primary cancer of right upper lobe of lung ([place])   08/04/2016 PET scan    PET IMPRESSION: 1. 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 Mass is confluent with the right superior hilum and right lower tracheal wall, suggestive of a T4 primary tumor. 2. Postobstructive pneumonia in the right upper lobe. 3. Hypermetabolic ipsilateral and contralateral mediastinal and contralateral hilar lymphadenopathy, suggestive of N3 nodal disease.      08/24/2016 Procedure    Bronchoscopy      08/25/2016 Pathology Results    Diagnosis Endobronchial biopsy, RUL - POORLY DIFFERENTIATED NON-SMALL CELL CARCINOMA. The immunophenotype is consistent with poorly differentiated adenocarcinoma.      09/10/2016 Imaging    MRI brain- No metastatic disease or acute intracranial abnormality.      09/20/2016 - 10/25/2016 Chemotherapy    Carboplatin/Paclitaxel weekly x 6 with XRT.      09/20/2016 - 10/25/2016 Radiation Therapy         11/15/2016 - 12/06/2016 Chemotherapy   Carboplatin/paclitaxel every 21 days x 2 cycles.      12/30/2016 Imaging    CT CAP- 1. Central right upper lobe lung mass has mildly decreased since 11/21/2016 chest CT angiogram and significantly decreased since 08/04/2016 PET-CT. 2. No pathologically enlarged thoracic lymph nodes. Previously described hypermetabolic thoracic adenopathy on the 08/04/2016 PET-CT has decreased in size. 3. Previously described bilateral lower lobe pulmonary nodules are stable in size. 4. New solitary subsolid 4 mm right middle lobe pulmonary nodule is nonspecific, and may be inflammatory. Recommend attention on a follow-up chest CT in 3 months. 5. No definite metastatic disease in the abdomen. Tiny sub 5 mm low-attenuation lesions in the liver are too small to characterize, favor benign. 6. Aortic atherosclerosis. One vessel coronary atherosclerosis. 7. Moderate emphysema.      01/25/2017 -  Chemotherapy    Durvalumab (Imfinzi) x 52 weeks.        She tolerated treatment reasonably well. I reviewed the side effects of Imfinzi. They're listed below: Side effects of Durvalumab: Peripheral edema, fatigue, skin rash, hyponatremia, constipation, decreased appetite, nausea, abdominal pain, colitis, diarrhea, tract infection, lymphocytopenia, infection, muscular skeletal pain, dyspnea, dyspnea on exertion, fever.  She reports improvement in her musculoskeletal pain with meloxicam treatment.  She also notes an improvement in her chest pain from radiation.  She continues to have a dry nonproductive cough.  This predates the start of immunotherapy.  It has not progressed and is stable.  She reports an episode 5 days ago in which she developed a sudden onset of shortness of breath in the evening.  She notes that it resolved with albuterol.  She describes the shortness of breath as sudden.  She denies any chest pain  but does admit to tightness in her breathing.  This too resolved with albuterol.  She denies any  recurrence of this issue.  Review of Systems  Constitutional: Negative.  Negative for chills, fever and weight loss.  HENT: Negative.   Eyes: Negative.   Respiratory: Positive for cough (chronic, non-productive.  Pre-dates immunotherapy) and shortness of breath. Negative for hemoptysis and sputum production.   Cardiovascular: Negative.  Negative for chest pain.  Gastrointestinal: Negative.  Negative for blood in stool, constipation, diarrhea, melena, nausea and vomiting.  Genitourinary: Negative.   Musculoskeletal: Negative.   Skin: Negative.   Neurological: Negative.  Negative for weakness.  Endo/Heme/Allergies: Negative.   Psychiatric/Behavioral: Negative.     Past Medical History:  Diagnosis Date  . Anxiety    uses it for menopause   . Arthritis   . Cancer ([place])    Right Lung  . Chronic bronchitis ([place])   . COPD (chronic obstructive pulmonary disease) ([place])   . GERD (gastroesophageal reflux disease)   . Headache    sinus  . Hypertension   . Pneumonia    07/22/16-had pneumonia in right lung and found mass in lung  . Primary cancer of right upper lobe of lung ([place]) 08/23/2016    Past Surgical History:  Procedure Laterality Date  . ABDOMINAL HYSTERECTOMY    . APPENDECTOMY     when had hysterectomy  . CARPAL TUNNEL RELEASE Bilateral   . ENDOBRONCHIAL ULTRASOUND Bilateral 08/23/2016   Procedure: ENDOBRONCHIAL ULTRASOUND;  Surgeon: Collene Gobble, MD;  Location: [facility];  Service: Cardiopulmonary;  Laterality: Bilateral;  . HERNIA REPAIR     left inguinal hernia age 60  . PORTACATH PLACEMENT Left 09/17/2016   Procedure: INSERTION PORT-A-CATH LEFT SUBCLAVIAN;  Surgeon: Aviva Signs, MD;  Location: [facility];  Service: General;  Laterality: Left;  . TUBAL LIGATION      Family History  Problem Relation Age of Onset  . Cancer Mother     lung  . Hypertension Mother   . Hypertension Father   . Diabetes Father   . Hypertension Sister   . Cancer Brother     lung  .  Hypertension Sister   . Colon cancer Neg Hx     Social History   Social History  . Marital status: Divorced    Spouse name: N/A  . Number of children: N/A  . Years of education: N/A   Social History Main Topics  . Smoking status: Former Smoker    Packs/day: 2.00    Years: 43.00    Quit date: 07/20/2016  . Smokeless tobacco: Never Used  . Alcohol use No  . Drug use: No  . Sexual activity: Not on file   Other Topics Concern  . Not on file   Social History Narrative  . No narrative on file     PHYSICAL EXAMINATION  ECOG PERFORMANCE STATUS: 1 - Symptomatic but completely ambulatory  There were no vitals filed for this visit.  Vitals - 1 value per visit 7/82/9562  SYSTOLIC 130  DIASTOLIC 65  Pulse 97  Temperature 98  Respirations 18  Weight (lb) 115.8    GENERAL:alert, well nourished, well developed, comfortable, cooperative, smiling and accompanied by long-time boyfriend, in chemo-recliner, intermittent dry cough. SKIN: skin color, texture, turgor are normal, no rashes or significant lesions HEAD: Normocephalic, No masses, lesions, tenderness or abnormalities EYES: normal, Conjunctiva are pink and non-injected EARS: External ears normal OROPHARYNX:lips, buccal mucosa, and tongue normal and mucous membranes are moist  NECK: supple, trachea midline LYMPH:  no palpable lymphadenopathy BREAST:not examined LUNGS: clear to auscultation and percussion HEART: regular rate & rhythm ABDOMEN:abdomen soft and normal bowel sounds BACK: Back symmetric, no curvature. EXTREMITIES:less then 2 second capillary refill, no joint deformities, effusion, or inflammation, no skin discoloration, no cyanosis  NEURO: alert & oriented x 3 with fluent speech, no focal motor/sensory deficits, gait normal   LABORATORY DATA: CBC    Component Value Date/Time   WBC 6.4 03/11/2017 1140   RBC 3.48 (L) 03/11/2017 1140   HGB 12.0 03/11/2017 1140   HGB 12.9 09/02/2016 1403   HCT 35.5 (L)  03/11/2017 1140   HCT 38.4 09/02/2016 1403   PLT 346 03/11/2017 1140   PLT 476 (H) 09/02/2016 1403   MCV 102.0 (H) 03/11/2017 1140   MCV 94.6 09/02/2016 1403   MCH 34.5 (H) 03/11/2017 1140   MCHC 33.8 03/11/2017 1140   RDW 12.2 03/11/2017 1140   RDW 13.6 09/02/2016 1403   LYMPHSABS 0.7 03/11/2017 1140   LYMPHSABS 2.5 09/02/2016 1403   MONOABS 0.4 03/11/2017 1140   MONOABS 0.7 09/02/2016 1403   EOSABS 0.3 03/11/2017 1140   EOSABS 0.7 (H) 09/02/2016 1403   BASOSABS 0.0 03/11/2017 1140   BASOSABS 0.1 09/02/2016 1403      Chemistry      Component Value Date/Time   NA 139 03/11/2017 1140   NA 140 09/02/2016 1403   K 4.1 03/11/2017 1140   K 4.2 09/02/2016 1403   CL 100 (L) 03/11/2017 1140   CO2 31 03/11/2017 1140   CO2 27 09/02/2016 1403   BUN 15 03/11/2017 1140   BUN 15.6 09/02/2016 1403   CREATININE 0.75 03/11/2017 1140   CREATININE 0.8 09/02/2016 1403      Component Value Date/Time   CALCIUM 9.0 03/11/2017 1140   CALCIUM 9.6 09/02/2016 1403   ALKPHOS 102 03/11/2017 1140   ALKPHOS 159 (H) 09/02/2016 1403   AST 14 (L) 03/11/2017 1140   AST 12 09/02/2016 1403   ALT 10 (L) 03/11/2017 1140   ALT <9 09/02/2016 1403   BILITOT 0.4 03/11/2017 1140   BILITOT <0.30 09/02/2016 1403        PENDING LABS:   RADIOGRAPHIC STUDIES:  No results found.   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  S/P concomitant chemoXRT with Carboplatin/Paclitaxel (09/20/2016- 10/25/2016) followed by 2 cycles of consolidative chemotherapy with Carboplatin/Paclitaxel (11/15/2016- 12/06/2016). Now on consolidative Durvalumab (Imfinzi) beginning on 01/25/2017 for up to 52 weeks.  Oncology history updated.  Labs today: CBC diff, CMET, TSH.  I personally reviewed and went over laboratory results with the patient.  The results are noted  within this dictation.  Labs in 2 weeks: CBC diff, CMET.  Labs in 4 weeks: CBC diff, CMET, TSH.  She has seen GI in consultation for odynophagia and GERD.  Viscous lidocaine was prescribed by Walden Field, NP (GI) for odynophagia.  She will continue with PPI therapy.  She has GI follow-up on 04/26/2017.  She is scheduled for restaging CT of chest on 03/31/2017.  She continues with a dry, nonproductive cough.  I have given her prescription for Tessalon Perles as she is allergic to codeine.  She can try this for cough.  If not improved, I would consider holding her Lisinopril to see if that helps, although this is a long-term medication.  I refilled  her Nexium and this is E scribed.  Her diffuse muscular skeletal pain is much improved with meloxicam.  She notes an episode of sudden onset of shortness of breath without chest pain.  It resolved with albuterol inhaler.  Return in 4 weeks for follow-up.     ORDERS PLACED FOR THIS ENCOUNTER: No orders of the defined types were placed in this encounter.   MEDICATIONS PRESCRIBED THIS ENCOUNTER: Meds ordered this encounter  Medications  . esomeprazole (NEXIUM) 20 MG capsule    Sig: Take 1 capsule (20 mg total) by mouth daily at 12 noon.    Dispense:  30 capsule    Refill:  2    Order Specific Question:   Supervising Provider    Answer:   Brunetta Genera [[id number]]  . benzonatate (TESSALON) 200 MG capsule    Sig: Take 1 capsule (200 mg total) by mouth 3 (three) times daily as needed for cough.    Dispense:  30 capsule    Refill:  2    Order Specific Question:   Supervising Provider    Answer:   Brunetta Genera [[id number]]    THERAPY PLAN:  Continue with Imfinzi for up to 52 weeks.  She is scheduled for restaging CT scan on 03/31/2017.  NCCN guidelines for Non-Small Cell Lung Cancer Surveillance in the setting of clinical/radiographic remission are as follows (5.2017):  A. Stage I-II (primary treatment included surgery +/-  chemotherapy):   1. H+P and chest CT +/- contrast every 6 months for 2-3 years, then H+P and low-dose non-contrast-enhanced chest CT annually  B. Stage I-II (primary treatment included RT) or Stage III or Stage IV (oligometastatic with all sites treated with definitive intent)   1. H+P and chest CT +/- contrast every 3-6 months for 3 years, then H+P and chest CT +/- contrast every 6 months for 2 years, then H+P and low-dose non-contrast-enhanced chest CT annually    A. Residual or new radiographic abnormalities may require more frequent imaging  C. Smoking cessation advice, counseling, and pharmacotherapy  D. PET/CT or Brain MRI is not routinely indicated.   All questions were answered. The patient knows to call the clinic with any problems, questions or concerns. We can certainly see the patient much sooner if necessary.  Patient and plan discussed with Dr. Twana First and she is in agreement with the aforementioned.   This note is electronically signed by: Doy Mince 03/11/2017 1:25 PM

## 2017-03-10 NOTE — Assessment & Plan Note (Addendum)
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  S/P concomitant chemoXRT with Carboplatin/Paclitaxel (09/20/2016- 10/25/2016) followed by 2 cycles of consolidative chemotherapy with Carboplatin/Paclitaxel (11/15/2016- 12/06/2016). Now on consolidative Durvalumab (Imfinzi) beginning on 01/25/2017 for up to 52 weeks.  Oncology history updated.  Labs today: CBC diff, CMET, TSH.  I personally reviewed and went over laboratory results with the patient.  The results are noted within this dictation.  Labs in 2 weeks: CBC diff, CMET.  Labs in 4 weeks: CBC diff, CMET, TSH.  She has seen GI in consultation for odynophagia and GERD.  Viscous lidocaine was prescribed by Walden Field, NP (GI) for odynophagia.  She will continue with PPI therapy.  She has GI follow-up on 04/26/2017.  She is scheduled for restaging CT of chest on 03/31/2017.  She continues with a dry, nonproductive cough.  I have given her prescription for Tessalon Perles as she is allergic to codeine.  She can try this for cough.  If not improved, I would consider holding her Lisinopril to see if that helps, although this is a long-term medication.  I refilled her Nexium and this is E scribed.  Her diffuse muscular skeletal pain is much improved with meloxicam.  She notes an episode of sudden onset of shortness of breath without chest pain.  It resolved with albuterol inhaler.  Return in 4 weeks for follow-up.

## 2017-03-11 ENCOUNTER — Encounter (HOSPITAL_BASED_OUTPATIENT_CLINIC_OR_DEPARTMENT_OTHER): Payer: Medicaid Other | Admitting: Oncology

## 2017-03-11 ENCOUNTER — Encounter (HOSPITAL_COMMUNITY): Payer: Self-pay

## 2017-03-11 ENCOUNTER — Encounter (HOSPITAL_COMMUNITY): Payer: Medicaid Other | Attending: Hematology & Oncology

## 2017-03-11 VITALS — BP 123/79 | HR 88 | Temp 98.2°F | Resp 18 | Wt 115.8 lb

## 2017-03-11 DIAGNOSIS — R05 Cough: Secondary | ICD-10-CM

## 2017-03-11 DIAGNOSIS — Z5112 Encounter for antineoplastic immunotherapy: Secondary | ICD-10-CM

## 2017-03-11 DIAGNOSIS — Z9221 Personal history of antineoplastic chemotherapy: Secondary | ICD-10-CM | POA: Diagnosis not present

## 2017-03-11 DIAGNOSIS — K219 Gastro-esophageal reflux disease without esophagitis: Secondary | ICD-10-CM

## 2017-03-11 DIAGNOSIS — C3411 Malignant neoplasm of upper lobe, right bronchus or lung: Secondary | ICD-10-CM | POA: Diagnosis present

## 2017-03-11 DIAGNOSIS — R059 Cough, unspecified: Secondary | ICD-10-CM

## 2017-03-11 LAB — CBC WITH DIFFERENTIAL/PLATELET
BASOS ABS: 0 10*3/uL (ref 0.0–0.1)
BASOS PCT: 1 %
Eosinophils Absolute: 0.3 10*3/uL (ref 0.0–0.7)
Eosinophils Relative: 5 %
HEMATOCRIT: 35.5 % — AB (ref 36.0–46.0)
Hemoglobin: 12 g/dL (ref 12.0–15.0)
LYMPHS PCT: 11 %
Lymphs Abs: 0.7 10*3/uL (ref 0.7–4.0)
MCH: 34.5 pg — ABNORMAL HIGH (ref 26.0–34.0)
MCHC: 33.8 g/dL (ref 30.0–36.0)
MCV: 102 fL — AB (ref 78.0–100.0)
MONO ABS: 0.4 10*3/uL (ref 0.1–1.0)
Monocytes Relative: 7 %
NEUTROS ABS: 4.9 10*3/uL (ref 1.7–7.7)
NEUTROS PCT: 76 %
Platelets: 346 10*3/uL (ref 150–400)
RBC: 3.48 MIL/uL — ABNORMAL LOW (ref 3.87–5.11)
RDW: 12.2 % (ref 11.5–15.5)
WBC: 6.4 10*3/uL (ref 4.0–10.5)

## 2017-03-11 LAB — COMPREHENSIVE METABOLIC PANEL
ALBUMIN: 3.7 g/dL (ref 3.5–5.0)
ALT: 10 U/L — AB (ref 14–54)
AST: 14 U/L — AB (ref 15–41)
Alkaline Phosphatase: 102 U/L (ref 38–126)
Anion gap: 8 (ref 5–15)
BILIRUBIN TOTAL: 0.4 mg/dL (ref 0.3–1.2)
BUN: 15 mg/dL (ref 6–20)
CHLORIDE: 100 mmol/L — AB (ref 101–111)
CO2: 31 mmol/L (ref 22–32)
CREATININE: 0.75 mg/dL (ref 0.44–1.00)
Calcium: 9 mg/dL (ref 8.9–10.3)
GFR calc Af Amer: >60 mL/min (ref >60)
GLUCOSE: 81 mg/dL (ref 65–99)
POTASSIUM: 4.1 mmol/L (ref 3.5–5.1)
Sodium: 139 mmol/L (ref 135–145)
TOTAL PROTEIN: 7 g/dL (ref 6.5–8.1)

## 2017-03-11 LAB — TSH: TSH: 3.327 u[IU]/mL (ref 0.350–4.500)

## 2017-03-11 MED ORDER — SODIUM CHLORIDE 0.9 % IV SOLN
Freq: Once | INTRAVENOUS | Status: AC
  Administered 2017-03-11: 14:00:00 via INTRAVENOUS

## 2017-03-11 MED ORDER — ONDANSETRON 8 MG PO TBDP
ORAL_TABLET | ORAL | Status: AC
  Filled 2017-03-11: qty 1

## 2017-03-11 MED ORDER — DURVALUMAB 500 MG/10ML IV SOLN
9.7000 mg/kg | Freq: Once | INTRAVENOUS | Status: AC
  Administered 2017-03-11: 500 mg via INTRAVENOUS
  Filled 2017-03-11: qty 10

## 2017-03-11 MED ORDER — SODIUM CHLORIDE 0.9% FLUSH: 10.0000 mL | INTRAVENOUS | Status: DC | PRN

## 2017-03-11 MED ORDER — ESOMEPRAZOLE MAGNESIUM 20 MG PO CPDR: 20.0000 mg | DELAYED_RELEASE_CAPSULE | Freq: Every day | ORAL | 2 refills | Status: DC

## 2017-03-11 MED ORDER — BENZONATATE 200 MG PO CAPS: 200.0000 mg | ORAL_CAPSULE | Freq: Three times a day (TID) | ORAL | 2 refills | Status: DC | PRN

## 2017-03-11 MED ORDER — ONDANSETRON 8 MG PO TBDP
8.0000 mg | ORAL_TABLET | Freq: Once | ORAL | Status: AC
  Administered 2017-03-11: 8 mg via ORAL

## 2017-03-11 MED ORDER — HEPARIN SOD (PORK) LOCK FLUSH 100 UNIT/ML IV SOLN
500.0000 [IU] | Freq: Once | INTRAVENOUS | Status: AC | PRN
  Administered 2017-03-11: 500 [IU]

## 2017-03-11 NOTE — Progress Notes (Signed)
Patient tolerated infusion well.  VSS.  Patient ambulatory and stable upon discharge from clinic.   

## 2017-03-11 NOTE — Patient Instructions (Signed)
[facility] Discharge Instructions for Patients Receiving Chemotherapy   Beginning January 23rd 2017 lab work for the [facility] will be done in the  Main lab at [facility] on 1st floor. If you have a lab appointment with the [facility] please come in thru the  Main Entrance and check in at the main information desk   Today you received the following chemotherapy agent: Imfinzi.     If you develop nausea and vomiting, or diarrhea that is not controlled by your medication, call the clinic.  The clinic phone number is [phone]. Office hours are Monday-Friday 8:30am-5:00pm.  BELOW ARE SYMPTOMS THAT SHOULD BE REPORTED IMMEDIATELY:  *FEVER GREATER THAN 101.0 F  *CHILLS WITH OR WITHOUT FEVER  NAUSEA AND VOMITING THAT IS NOT CONTROLLED WITH YOUR NAUSEA MEDICATION  *UNUSUAL SHORTNESS OF BREATH  *UNUSUAL BRUISING OR BLEEDING  TENDERNESS IN MOUTH AND THROAT WITH OR WITHOUT PRESENCE OF ULCERS  *URINARY PROBLEMS  *BOWEL PROBLEMS  UNUSUAL RASH Items with * indicate a potential emergency and should be followed up as soon as possible. If you have an emergency after office hours please contact your primary care physician or go to the nearest emergency department.  Please call the clinic during office hours if you have any questions or concerns.   You may also contact the Patient Navigator at [phone] should you have any questions or need assistance in obtaining follow up care.      Resources For Cancer Patients and their Caregivers ? American Cancer Society: Can assist with transportation, wigs, general needs, runs Look Good Feel Better.        435-863-3805 ? Cancer Care: Provides financial assistance, online support groups, medication/co-pay assistance.  1-800-813-HOPE (262)634-4258) ? [place] Assists [place] cancer patients and their families through emotional , educational and financial support.   289-651-9979 ? [place] DSS Where to apply for food stamps, Medicaid and utility assistance. 9044421606 ? [employer]: Transportation to medical appointments. 562-180-3922 ? Social Security Administration: May apply for disability if have a Stage IV cancer. [phone] 980-602-3903 ? [employer], Disability and Transit Services: Assists with nutrition, care and transit needs. [phone]

## 2017-03-11 NOTE — Patient Instructions (Signed)
[place] at [facility] Discharge Instructions  RECOMMENDATIONS MADE BY THE CONSULTANT AND ANY TEST RESULTS WILL BE SENT TO YOUR REFERRING PHYSICIAN.  You were seen today by Manning Charity Follow up in 3 weeks See Amy up front for appointments   Thank you for choosing [place] at [facility] to provide your oncology and hematology care.  To afford each patient quality time with our provider, please arrive at least 15 minutes before your scheduled appointment time.    If you have a lab appointment with the [facility] please come in thru the  Main Entrance and check in at the main information desk  You need to re-schedule your appointment should you arrive 10 or more minutes late.  We strive to give you quality time with our providers, and arriving late affects you and other patients whose appointments are after yours.  Also, if you no show three or more times for appointments you may be dismissed from the clinic at the providers discretion.     Again, thank you for choosing [facility].  Our hope is that these requests will decrease the amount of time that you wait before being seen by our physicians.       _____________________________________________________________  Should you have questions after your visit to [facility], please contact our office at [phone] between the hours of 8:30 a.m. and 4:30 p.m.  Voicemails left after 4:30 p.m. will not be returned until the following business day.  For prescription refill requests, have your pharmacy contact our office.       Resources For Cancer Patients and their Caregivers ? American Cancer Society: Can assist with transportation, wigs, general needs, runs Look Good Feel Better.        581-275-4212 ? Cancer Care: Provides financial assistance, online support groups, medication/co-pay assistance.  1-800-813-HOPE 680-524-9956) ? [place] Assists [place] cancer patients and their families through emotional , educational and financial support.  (563)094-6764 ? [place] DSS Where to apply for food stamps, Medicaid and utility assistance. (980)671-9230 ? [employer]: Transportation to medical appointments. (806) [phone] ? Social Security Administration: May apply for disability if have a Stage IV cancer. 431-008-8809 2393774667 ? [employer], Disability and Transit Services: Assists with nutrition, care and transit needs. [place] Support Programs: '@10RELATIVEDAYS'$ @ > Cancer Support Group  2nd Tuesday of the month 1pm-2pm, Journey Room  > Creative Journey  3rd Tuesday of the month 1130am-1pm, Journey Room  > Look Good Feel Better  1st Wednesday of the month 10am-12 noon, Journey Room (Call [facility] to register [phone])

## 2017-03-28 ENCOUNTER — Encounter (HOSPITAL_COMMUNITY): Payer: Medicaid Other | Attending: Hematology & Oncology

## 2017-03-28 ENCOUNTER — Ambulatory Visit (HOSPITAL_COMMUNITY)
Admission: RE | Admit: 2017-03-28 | Discharge: 2017-03-28 | Disposition: A | Discharge status: Home / Self Care | Payer: Medicaid Other | Source: Ambulatory Visit | Attending: Oncology | Admitting: Oncology

## 2017-03-28 ENCOUNTER — Encounter (HOSPITAL_COMMUNITY): Payer: Self-pay

## 2017-03-28 VITALS — BP 136/77 | HR 89 | Temp 98.0°F | Resp 18 | Wt 116.8 lb

## 2017-03-28 DIAGNOSIS — C3411 Malignant neoplasm of upper lobe, right bronchus or lung: Secondary | ICD-10-CM

## 2017-03-28 DIAGNOSIS — Z5112 Encounter for antineoplastic immunotherapy: Secondary | ICD-10-CM | POA: Diagnosis not present

## 2017-03-28 DIAGNOSIS — Z9221 Personal history of antineoplastic chemotherapy: Secondary | ICD-10-CM | POA: Diagnosis not present

## 2017-03-28 DIAGNOSIS — R918 Other nonspecific abnormal finding of lung field: Secondary | ICD-10-CM | POA: Diagnosis not present

## 2017-03-28 DIAGNOSIS — R0602 Shortness of breath: Secondary | ICD-10-CM | POA: Diagnosis present

## 2017-03-28 LAB — CBC WITH DIFFERENTIAL/PLATELET
BASOS ABS: 0 10*3/uL (ref 0.0–0.1)
BASOS PCT: 0 %
EOS ABS: 0.3 10*3/uL (ref 0.0–0.7)
Eosinophils Relative: 3 %
HCT: 34.4 % — ABNORMAL LOW (ref 36.0–46.0)
HEMOGLOBIN: 11.5 g/dL — AB (ref 12.0–15.0)
LYMPHS ABS: 0.8 10*3/uL (ref 0.7–4.0)
Lymphocytes Relative: 10 %
MCH: 33.1 pg (ref 26.0–34.0)
MCHC: 33.4 g/dL (ref 30.0–36.0)
MCV: 99.1 fL (ref 78.0–100.0)
Monocytes Absolute: 0.6 10*3/uL (ref 0.1–1.0)
Monocytes Relative: 8 %
NEUTROS PCT: 79 %
Neutro Abs: 6.2 10*3/uL (ref 1.7–7.7)
Platelets: 401 10*3/uL — ABNORMAL HIGH (ref 150–400)
RBC: 3.47 MIL/uL — AB (ref 3.87–5.11)
RDW: 12.2 % (ref 11.5–15.5)
WBC: 7.9 10*3/uL (ref 4.0–10.5)

## 2017-03-28 LAB — COMPREHENSIVE METABOLIC PANEL
ALBUMIN: 3.6 g/dL (ref 3.5–5.0)
ALK PHOS: 116 U/L (ref 38–126)
ALT: 10 U/L — AB (ref 14–54)
AST: 13 U/L — ABNORMAL LOW (ref 15–41)
Anion gap: 11 (ref 5–15)
BUN: 15 mg/dL (ref 6–20)
CALCIUM: 9.3 mg/dL (ref 8.9–10.3)
CO2: 28 mmol/L (ref 22–32)
Chloride: 100 mmol/L — ABNORMAL LOW (ref 101–111)
Creatinine, Ser: 0.71 mg/dL (ref 0.44–1.00)
GFR calc Af Amer: >60 mL/min (ref >60)
GFR calc non Af Amer: >60 mL/min (ref >60)
GLUCOSE: 98 mg/dL (ref 65–99)
Potassium: 4 mmol/L (ref 3.5–5.1)
SODIUM: 139 mmol/L (ref 135–145)
Total Bilirubin: 0.4 mg/dL (ref 0.3–1.2)
Total Protein: 7.1 g/dL (ref 6.5–8.1)

## 2017-03-28 MED ORDER — SODIUM CHLORIDE 0.9 % IV SOLN
Freq: Once | INTRAVENOUS | Status: AC
  Administered 2017-03-28: 14:00:00 via INTRAVENOUS

## 2017-03-28 MED ORDER — ACETAMINOPHEN 325 MG PO TABS
ORAL_TABLET | ORAL | Status: AC
  Filled 2017-03-28: qty 2

## 2017-03-28 MED ORDER — ONDANSETRON 8 MG PO TBDP
ORAL_TABLET | ORAL | Status: AC
  Filled 2017-03-28: qty 1

## 2017-03-28 MED ORDER — HEPARIN SOD (PORK) LOCK FLUSH 100 UNIT/ML IV SOLN
500.0000 [IU] | Freq: Once | INTRAVENOUS | Status: AC | PRN
  Administered 2017-03-28: 500 [IU]

## 2017-03-28 MED ORDER — SODIUM CHLORIDE 0.9% FLUSH
10.0000 mL | INTRAVENOUS | Status: DC | PRN
  Administered 2017-03-28: 10 mL
  Filled 2017-03-28: qty 10

## 2017-03-28 MED ORDER — ACETAMINOPHEN 325 MG PO TABS
650.0000 mg | ORAL_TABLET | Freq: Once | ORAL | Status: AC
Start: 2017-03-28 — End: 2017-03-28
  Administered 2017-03-28: 650 mg via ORAL

## 2017-03-28 MED ORDER — ONDANSETRON 8 MG PO TBDP
8.0000 mg | ORAL_TABLET | Freq: Once | ORAL | Status: AC
  Administered 2017-03-28: 8 mg via ORAL

## 2017-03-28 MED ORDER — SODIUM CHLORIDE 0.9 % IV SOLN
9.7000 mg/kg | Freq: Once | INTRAVENOUS | Status: AC
  Administered 2017-03-28: 500 mg via INTRAVENOUS
  Filled 2017-03-28: qty 10

## 2017-03-28 NOTE — Progress Notes (Signed)
Labs reviewed, patient complaining of increased SOB, cought for 3 weeks, medicine not working that was prescribed, patient stated that she is having to use her inhaler. MD notified and chest xray ordered.   chest xray done, results reviewed with MD. Proceed with treatment.  Chemotherapy given per orders, patient tolerated it well, no problems. Vitals stable and discharged home ambulatory . Follow up as scheduled.

## 2017-03-28 NOTE — Patient Instructions (Signed)
[facility] Discharge Instructions for Patients Receiving Chemotherapy   Beginning January 23rd 2017 lab work for the Cancer Center will be done in the  Main lab at [facility] on 1st floor. If you have a lab appointment with the Cancer Center please come in thru the  Main Entrance and check in at the main information desk   Today you received the following chemotherapy agents   To help prevent nausea and vomiting after your treatment, we encourage you to take your nausea medication     If you develop nausea and vomiting, or diarrhea that is not controlled by your medication, call the clinic.  The clinic phone number is [phone]. Office hours are Monday-Friday 8:30am-5:00pm.  BELOW ARE SYMPTOMS THAT SHOULD BE REPORTED IMMEDIATELY:  *FEVER GREATER THAN 101.0 F  *CHILLS WITH OR WITHOUT FEVER  NAUSEA AND VOMITING THAT IS NOT CONTROLLED WITH YOUR NAUSEA MEDICATION  *UNUSUAL SHORTNESS OF BREATH  *UNUSUAL BRUISING OR BLEEDING  TENDERNESS IN MOUTH AND THROAT WITH OR WITHOUT PRESENCE OF ULCERS  *URINARY PROBLEMS  *BOWEL PROBLEMS  UNUSUAL RASH Items with * indicate a potential emergency and should be followed up as soon as possible. If you have an emergency after office hours please contact your primary care physician or go to the nearest emergency department.  Please call the clinic during office hours if you have any questions or concerns.   You may also contact the Patient Navigator at [phone] should you have any questions or need assistance in obtaining follow up care.      Resources For Cancer Patients and their Caregivers ? American Cancer Society: Can assist with transportation, wigs, general needs, runs Look Good Feel Better.        1-888-227-6333 ? Cancer Care: Provides financial assistance, online support groups, medication/co-pay assistance.  1-800-813-HOPE (4673) ? [facility] Assists [place] cancer  patients and their families through emotional , educational and financial support.  336-427-4357 ? [place] DSS Where to apply for food stamps, Medicaid and utility assistance. 336-342-1394 ? [employer]: Transportation to medical appointments. 336-347-2287 ? Social Security Administration: May apply for disability if have a Stage IV cancer. [phone] 1-800-772-1213 ? [place] [employer], Disability and Transit Services: Assists with nutrition, care and transit needs. [phone]         

## 2017-03-30 ENCOUNTER — Telehealth: Payer: Self-pay | Admitting: Hematology

## 2017-03-30 NOTE — Telephone Encounter (Signed)
Mailed records to pt residence-per ROI

## 2017-03-31 ENCOUNTER — Ambulatory Visit (HOSPITAL_COMMUNITY)
Admission: RE | Admit: 2017-03-31 | Discharge: 2017-03-31 | Disposition: A | Discharge status: Home / Self Care | Payer: Medicaid Other | Source: Ambulatory Visit | Attending: Adult Health | Admitting: Adult Health

## 2017-03-31 ENCOUNTER — Telehealth (HOSPITAL_COMMUNITY): Payer: Self-pay | Admitting: Adult Health

## 2017-03-31 ENCOUNTER — Encounter (HOSPITAL_COMMUNITY): Payer: Self-pay

## 2017-03-31 DIAGNOSIS — C3411 Malignant neoplasm of upper lobe, right bronchus or lung: Secondary | ICD-10-CM | POA: Diagnosis not present

## 2017-03-31 DIAGNOSIS — I2510 Atherosclerotic heart disease of native coronary artery without angina pectoris: Secondary | ICD-10-CM | POA: Diagnosis not present

## 2017-03-31 DIAGNOSIS — J439 Emphysema, unspecified: Secondary | ICD-10-CM | POA: Diagnosis not present

## 2017-03-31 DIAGNOSIS — I700 Atherosclerosis of aorta: Secondary | ICD-10-CM | POA: Diagnosis not present

## 2017-03-31 DIAGNOSIS — R911 Solitary pulmonary nodule: Secondary | ICD-10-CM | POA: Diagnosis not present

## 2017-03-31 DIAGNOSIS — R918 Other nonspecific abnormal finding of lung field: Secondary | ICD-10-CM | POA: Insufficient documentation

## 2017-03-31 MED ORDER — IOPAMIDOL (ISOVUE-300) INJECTION 61%
75.0000 mL | Freq: Once | INTRAVENOUS | Status: AC | PRN
  Administered 2017-03-31: 75 mL via INTRAVENOUS

## 2017-03-31 NOTE — Telephone Encounter (Signed)
Called and spoke with patient via phone to review CT results from CT chest 03/31/17.   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 There are no new areas concerning for metastatic disease or new malignancy in the chest.  She will continue immunotherapy as scheduled.   She voiced understanding and appreciation for the call. She knows to call us with any other questions/concerns before her next visit at the cancer center.     Mike Craze, NP [place] [phone]

## 2017-04-11 ENCOUNTER — Encounter (HOSPITAL_COMMUNITY): Payer: Self-pay

## 2017-04-11 ENCOUNTER — Ambulatory Visit (HOSPITAL_COMMUNITY): Payer: Medicaid Other

## 2017-04-11 ENCOUNTER — Other Ambulatory Visit (HOSPITAL_COMMUNITY): Payer: Self-pay | Admitting: Oncology

## 2017-04-11 ENCOUNTER — Encounter (HOSPITAL_BASED_OUTPATIENT_CLINIC_OR_DEPARTMENT_OTHER): Payer: Medicaid Other | Admitting: Oncology

## 2017-04-11 ENCOUNTER — Encounter (HOSPITAL_BASED_OUTPATIENT_CLINIC_OR_DEPARTMENT_OTHER): Payer: Medicaid Other

## 2017-04-11 VITALS — BP 117/73 | HR 84 | Temp 98.0°F | Resp 20

## 2017-04-11 DIAGNOSIS — C3411 Malignant neoplasm of upper lobe, right bronchus or lung: Secondary | ICD-10-CM | POA: Diagnosis present

## 2017-04-11 DIAGNOSIS — Z5112 Encounter for antineoplastic immunotherapy: Secondary | ICD-10-CM | POA: Diagnosis not present

## 2017-04-11 DIAGNOSIS — D6481 Anemia due to antineoplastic chemotherapy: Secondary | ICD-10-CM | POA: Diagnosis not present

## 2017-04-11 DIAGNOSIS — R05 Cough: Secondary | ICD-10-CM | POA: Diagnosis not present

## 2017-04-11 DIAGNOSIS — K5900 Constipation, unspecified: Secondary | ICD-10-CM

## 2017-04-11 DIAGNOSIS — G893 Neoplasm related pain (acute) (chronic): Secondary | ICD-10-CM

## 2017-04-11 LAB — COMPREHENSIVE METABOLIC PANEL
ALK PHOS: 113 U/L (ref 38–126)
ALT: 13 U/L — AB (ref 14–54)
ANION GAP: 7 (ref 5–15)
AST: 13 U/L — ABNORMAL LOW (ref 15–41)
Albumin: 3.7 g/dL (ref 3.5–5.0)
BUN: 15 mg/dL (ref 6–20)
CALCIUM: 9.3 mg/dL (ref 8.9–10.3)
CO2: 29 mmol/L (ref 22–32)
CREATININE: 0.8 mg/dL (ref 0.44–1.00)
Chloride: 102 mmol/L (ref 101–111)
Glucose, Bld: 95 mg/dL (ref 65–99)
Potassium: 4.1 mmol/L (ref 3.5–5.1)
SODIUM: 138 mmol/L (ref 135–145)
TOTAL PROTEIN: 7.5 g/dL (ref 6.5–8.1)
Total Bilirubin: 0.5 mg/dL (ref 0.3–1.2)

## 2017-04-11 LAB — CBC WITH DIFFERENTIAL/PLATELET
Basophils Absolute: 0 10*3/uL (ref 0.0–0.1)
Basophils Relative: 1 %
EOS ABS: 0.3 10*3/uL (ref 0.0–0.7)
EOS PCT: 5 %
HCT: 35.6 % — ABNORMAL LOW (ref 36.0–46.0)
Hemoglobin: 11.9 g/dL — ABNORMAL LOW (ref 12.0–15.0)
LYMPHS ABS: 0.8 10*3/uL (ref 0.7–4.0)
LYMPHS PCT: 12 %
MCH: 32.5 pg (ref 26.0–34.0)
MCHC: 33.4 g/dL (ref 30.0–36.0)
MCV: 97.3 fL (ref 78.0–100.0)
MONOS PCT: 9 %
Monocytes Absolute: 0.6 10*3/uL (ref 0.1–1.0)
NEUTROS PCT: 73 %
Neutro Abs: 4.9 10*3/uL (ref 1.7–7.7)
Platelets: 423 10*3/uL — ABNORMAL HIGH (ref 150–400)
RBC: 3.66 MIL/uL — ABNORMAL LOW (ref 3.87–5.11)
RDW: 12.6 % (ref 11.5–15.5)
WBC: 6.6 10*3/uL (ref 4.0–10.5)

## 2017-04-11 LAB — TSH: TSH: 3.266 u[IU]/mL (ref 0.350–4.500)

## 2017-04-11 MED ORDER — SODIUM CHLORIDE 0.9 % IV SOLN
Freq: Once | INTRAVENOUS | Status: AC
  Administered 2017-04-11: 11:00:00 via INTRAVENOUS

## 2017-04-11 MED ORDER — TRAMADOL HCL 50 MG PO TABS: 50.0000 mg | ORAL_TABLET | Freq: Four times a day (QID) | ORAL | 0 refills | Status: DC | PRN

## 2017-04-11 MED ORDER — SODIUM CHLORIDE 0.9% FLUSH
10.0000 mL | INTRAVENOUS | Status: DC | PRN
  Administered 2017-04-11: 10 mL
  Filled 2017-04-11: qty 10

## 2017-04-11 MED ORDER — ONDANSETRON 8 MG PO TBDP
8.0000 mg | ORAL_TABLET | Freq: Once | ORAL | Status: AC
  Administered 2017-04-11: 8 mg via ORAL
  Filled 2017-04-11: qty 1

## 2017-04-11 MED ORDER — SODIUM CHLORIDE 0.9 % IV SOLN
9.7000 mg/kg | Freq: Once | INTRAVENOUS | Status: AC
  Administered 2017-04-11: 500 mg via INTRAVENOUS
  Filled 2017-04-11: qty 10

## 2017-04-11 MED ORDER — MELOXICAM 15 MG PO TABS: 15.0000 mg | ORAL_TABLET | Freq: Every day | ORAL | 1 refills | Status: DC

## 2017-04-11 MED ORDER — HEPARIN SOD (PORK) LOCK FLUSH 100 UNIT/ML IV SOLN
500.0000 [IU] | Freq: Once | INTRAVENOUS | Status: AC | PRN
  Administered 2017-04-11: 500 [IU]

## 2017-04-11 NOTE — Patient Instructions (Signed)
[place] at [facility] Discharge Instructions  RECOMMENDATIONS MADE BY THE CONSULTANT AND ANY TEST RESULTS WILL BE SENT TO YOUR REFERRING PHYSICIAN.  You were seen today by Dr. Twana First Follow up in 2 weeks See Amy up front for appointments   Thank you for choosing [place] at [facility] to provide your oncology and hematology care.  To afford each patient quality time with our provider, please arrive at least 15 minutes before your scheduled appointment time.    If you have a lab appointment with the [facility] please come in thru the  Main Entrance and check in at the main information desk  You need to re-schedule your appointment should you arrive 10 or more minutes late.  We strive to give you quality time with our providers, and arriving late affects you and other patients whose appointments are after yours.  Also, if you no show three or more times for appointments you may be dismissed from the clinic at the providers discretion.     Again, thank you for choosing [facility].  Our hope is that these requests will decrease the amount of time that you wait before being seen by our physicians.       _____________________________________________________________  Should you have questions after your visit to [facility], please contact our office at [phone] between the hours of 8:30 a.m. and 4:30 p.m.  Voicemails left after 4:30 p.m. will not be returned until the following business day.  For prescription refill requests, have your pharmacy contact our office.       Resources For Cancer Patients and their Caregivers ? American Cancer Society: Can assist with transportation, wigs, general needs, runs Look Good Feel Better.        ([phone] ? Cancer Care: Provides financial assistance, online support groups, medication/co-pay assistance.  1-800-813-HOPE 506-130-2602) ? [place] Assists [place] cancer patients and their families through emotional , educational and financial support.  956-378-4179 ? [place] DSS Where to apply for food stamps, Medicaid and utility assistance. 443-065-0542 ? [employer]: Transportation to medical appointments. 608-807-4807 ? Social Security Administration: May apply for disability if have a Stage IV cancer. ([phone] 7784057124 ? [employer], Disability and Transit Services: Assists with nutrition, care and transit needs. [place] Support Programs: '@10RELATIVEDAYS'$ @ > Cancer Support Group  2nd Tuesday of the month 1pm-2pm, Journey Room  > Creative Journey  3rd Tuesday of the month 1130am-1pm, Journey Room  > Look Good Feel Better  1st Wednesday of the month 10am-12 noon, Journey Room (Call [place] to register [phone])

## 2017-04-11 NOTE — Progress Notes (Signed)
[name], MD [place] [address]  No diagnosis found.  CURRENT THERAPY: Durvalumab every 14 days    Primary cancer of right upper lobe of lung ([place])   08/04/2016 PET scan    PET IMPRESSION: 1. 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 Mass is confluent with the right superior hilum and right lower tracheal wall, suggestive of a T4 primary tumor. 2. Postobstructive pneumonia in the right upper lobe. 3. Hypermetabolic ipsilateral and contralateral mediastinal and contralateral hilar lymphadenopathy, suggestive of N3 nodal disease.      08/24/2016 Procedure    Bronchoscopy      08/25/2016 Pathology Results    Diagnosis Endobronchial biopsy, RUL - POORLY DIFFERENTIATED NON-SMALL CELL CARCINOMA. The immunophenotype is consistent with poorly differentiated adenocarcinoma.      09/10/2016 Imaging    MRI brain- No metastatic disease or acute intracranial abnormality.      09/20/2016 - 10/25/2016 Chemotherapy    Carboplatin/Paclitaxel weekly x 6 with XRT.      09/20/2016 - 10/25/2016 Radiation Therapy         11/15/2016 - 12/06/2016 Chemotherapy    Carboplatin/paclitaxel every 21 days x 2 cycles.      12/30/2016 Imaging    CT CAP- 1. Central right upper lobe lung mass has mildly decreased since 11/21/2016 chest CT angiogram and significantly decreased since 08/04/2016 PET-CT. 2. No pathologically enlarged thoracic lymph nodes. Previously described hypermetabolic thoracic adenopathy on the 08/04/2016 PET-CT has decreased in size. 3. Previously described bilateral lower lobe pulmonary nodules are stable in size. 4. New solitary subsolid 4 mm right middle lobe pulmonary nodule is nonspecific, and may be inflammatory. Recommend attention on a follow-up chest CT in 3 months. 5. No definite metastatic disease in  the abdomen. Tiny sub 5 mm low-attenuation lesions in the liver are too small to characterize, favor benign. 6. Aortic atherosclerosis. One vessel coronary atherosclerosis. 7. Moderate emphysema.      01/25/2017 -  Chemotherapy    Durvalumab (Imfinzi) x 52 weeks.         INTERVAL HISTORY: Emily Pope 61 y.o. 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  Patient is accompanied by her husband today.  The patient had CT chest with contrast on 03/31/17 revealing a 3.9 cm stable nodule in the posterior right upper lobe of lung, as well as a stable sub-centimeter right lower lobe pulmonary nodule.   On review of systems, the patient reports a cough, which is typically nonproductive. She reports this cough has gotten worse in the last 2 weeks; she reports she cannot speak sometimes for how frequently she coughs. She denies chest pain, other than occasional "stabbing pain."  She is taking Tramadol once daily, usually at night. She reports shoulder and hip pain, as well as myalgia. She reports a stable appetite. She reports bloating and increased flatus.   Review of Systems  HENT: Negative for congestion (Congestion has improved and nearly subsided.).   Eyes: Negative.   Respiratory: Positive for cough (Ongoing, worse in the last 2 weeks.) and shortness of breath (While walking). Sputum production: milky.   Cardiovascular: Negative.  Negative for chest pain (Denies pain other than ocassional "stabbing pain.").  Gastrointestinal:  Increased flatus. Bloating.  Genitourinary: Negative.   Musculoskeletal: Positive for myalgias (Shoulder and hip pain.).  Endo/Heme/Allergies: Negative.   Psychiatric/Behavioral: Negative.   14 point  review of systems was performed and is negative except as detailed under history of present illness and above   Past Medical History:  Diagnosis Date  . Anxiety    uses it for menopause   . Arthritis   . Cancer ([employer])    Right Lung  . Chronic bronchitis ([place])   . COPD (chronic obstructive pulmonary disease) ([place])   . GERD (gastroesophageal reflux disease)   . Headache    sinus  . Hypertension   . Pneumonia    07/22/16-had pneumonia in right lung and found mass in lung  . Primary cancer of right upper lobe of lung ([place]) 08/23/2016    Past Surgical History:  Procedure Laterality Date  . ABDOMINAL HYSTERECTOMY    . APPENDECTOMY     when had hysterectomy  . CARPAL TUNNEL RELEASE Bilateral   . ENDOBRONCHIAL ULTRASOUND Bilateral 08/23/2016   Procedure: ENDOBRONCHIAL ULTRASOUND;  Surgeon: Collene Gobble, MD;  Location: [facility];  Service: Cardiopulmonary;  Laterality: Bilateral;  . HERNIA REPAIR     left inguinal hernia age 55  . PORTACATH PLACEMENT Left 09/17/2016   Procedure: INSERTION PORT-A-CATH LEFT SUBCLAVIAN;  Surgeon: Aviva Signs, MD;  Location: [facility];  Service: General;  Laterality: Left;  . TUBAL LIGATION      Family History  Problem Relation Age of Onset  . Cancer Mother     lung  . Hypertension Mother   . Hypertension Father   . Diabetes Father   . Hypertension Sister   . Cancer Brother     lung  . Hypertension Sister   . Colon cancer Neg Hx     Social History   Social History  . Marital status: Divorced    Spouse name: N/A  . Number of children: N/A  . Years of education: N/A   Social History Main Topics  . Smoking status: Former Smoker    Packs/day: 2.00    Years: 43.00    Quit date: 07/20/2016  . Smokeless tobacco: Never Used  . Alcohol use No  . Drug use: No  . Sexual activity: Not on file   Other Topics Concern  . Not on file   Social History Narrative  . No narrative on file     PHYSICAL EXAMINATION  ECOG PERFORMANCE STATUS: 1 -  Symptomatic but completely ambulatory  Vitals with BMI 04/11/2017  Height   Weight 116 lbs  BMI   Systolic 427  Diastolic 91  Pulse 062  Respirations 22   Physical Exam  Constitutional: She is oriented to person, place, and time and well-developed, well-nourished, and in no distress.  HENT:  Head: Normocephalic and atraumatic.  Nose: Nose normal.  Mouth/Throat: Oropharynx is clear and moist.  Eyes: Conjunctivae and EOM are normal. Pupils are equal, round, and reactive to light.  Neck: Normal range of motion. Neck supple.  Cardiovascular: Normal rate, regular rhythm and normal heart sounds.  Exam reveals no gallop and no friction rub.   No murmur heard. Pulmonary/Chest: Effort normal and breath sounds normal. No respiratory distress. She has no wheezes. She has no rales. She exhibits no tenderness.  Abdominal: Soft. Bowel sounds are normal. She exhibits no distension and no mass. There is no tenderness. There is no rebound and no guarding.  Musculoskeletal: Normal range of motion. She exhibits no edema.  Neurological:  She is alert and oriented to person, place, and time. She has normal reflexes. No cranial nerve deficit. Gait normal. Coordination normal.  Skin: Skin is warm and dry.  Psychiatric: Mood, memory, affect and judgment normal.  Nursing note and vitals reviewed.   LABORATORY DATA: I have reviewed the data as listed. Results for ACHAIA, GARLOCK (MRN [id number])   Ref. Range 12/30/2016 14:00  WBC Latest Ref Range: 4.0 - 10.5 K/uL 4.0  RBC Latest Ref Range: 3.87 - 5.11 MIL/uL 2.53 (L)  Hemoglobin Latest Ref Range: 12.0 - 15.0 g/dL 9.6 (L)  HCT Latest Ref Range: 36.0 - 46.0 % 27.9 (L)  MCV Latest Ref Range: 78.0 - 100.0 fL 110.3 (H)  MCH Latest Ref Range: 26.0 - 34.0 pg 37.9 (H)  MCHC Latest Ref Range: 30.0 - 36.0 g/dL 34.4  RDW Latest Ref Range: 11.5 - 15.5 % 18.1 (H)  Platelets Latest Ref Range: 150 - 400 K/uL 283  Neutrophils Latest Units: % 77  Lymphocytes Latest  Units: % 13  Monocytes Relative Latest Units: % 6  Eosinophil Latest Units: % 3  Basophil Latest Units: % 1  NEUT# Latest Ref Range: 1.7 - 7.7 K/uL 3.1  Lymphocyte # Latest Ref Range: 0.7 - 4.0 K/uL 0.5 (L)  Monocyte # Latest Ref Range: 0.1 - 1.0 K/uL 0.2  Eosinophils Absolute Latest Ref Range: 0.0 - 0.7 K/uL 0.1  Basophils Absolute Latest Ref Range: 0.0 - 0.1 K/uL 0.0  04/11/17 labs pending.    Chemistry      Component Value Date/Time   NA 139 03/28/2017 1139   NA 140 09/02/2016 1403   K 4.0 03/28/2017 1139   K 4.2 09/02/2016 1403   CL 100 (L) 03/28/2017 1139   CO2 28 03/28/2017 1139   CO2 27 09/02/2016 1403   BUN 15 03/28/2017 1139   BUN 15.6 09/02/2016 1403   CREATININE 0.71 03/28/2017 1139   CREATININE 0.8 09/02/2016 1403      Component Value Date/Time   CALCIUM 9.3 03/28/2017 1139   CALCIUM 9.6 09/02/2016 1403   ALKPHOS 116 03/28/2017 1139   ALKPHOS 159 (H) 09/02/2016 1403   AST 13 (L) 03/28/2017 1139   AST 12 09/02/2016 1403   ALT 10 (L) 03/28/2017 1139   ALT <9 09/02/2016 1403   BILITOT 0.4 03/28/2017 1139   BILITOT <0.30 09/02/2016 1403       RADIOGRAPHIC STUDIES: I have personally reviewed the radiological images as listed and agreed with the findings in the report. Study Result   CLINICAL DATA:  Stage IIIb poorly differentiated primary bronchogenic adenocarcinoma of the right upper lobe diagnosed August 2017, completed radiation therapy 10/25/2016 with most recent chemotherapy 12/06/2016. Restaging.  EXAM: CT CHEST AND ABDOMEN WITH CONTRAST  TECHNIQUE: Multidetector CT imaging of the chest and abdomen was performed following the standard protocol during bolus administration of intravenous contrast.  CONTRAST:  114m ISOVUE-300 IOPAMIDOL (ISOVUE-300) INJECTION 61%  COMPARISON:  11/21/2016 chest CT angiogram.  08/04/2016 PET-CT .  FINDINGS: CT CHEST FINDINGS  Cardiovascular: Normal heart size. No significant pericardial fluid/thickening.  Left subclavian MediPort terminates in the lower third of the superior vena cava. Left anterior descending coronary atherosclerosis. Atherosclerotic nonaneurysmal thoracic aorta. Normal caliber pulmonary arteries. No central pulmonary emboli.  Mediastinum/Nodes: No discrete thyroid nodules. Unremarkable esophagus. No pathologically enlarged axillary, mediastinal or hilar lymph nodes. Previously described mildly enlarged hypermetabolic 1.2 cm right paratracheal node on 08/04/2016 PET-CT study is decreased to 0.6 cm. Previously described mildly enlarged hypermetabolic subcarinal 1.2 cm node  has decreased to 0.7 cm.  Lungs/Pleura: No pneumothorax. No pleural effusion. Moderate centrilobular emphysema with mild diffuse bronchial wall thickening. Central right upper lobe 4.9 x 3.7 cm lung mass (series 3/image 35) with amorphous internal calcifications, decreased from 5.2 x 4.0 cm on 11/21/2016 using similar measurement technique and 7.9 x 5.6 cm on 08/04/2016. Patchy reticulation and ground-glass opacity surrounding of the right upper lobe lung mass is consistent with evolving postradiation change. New subsolid 4 mm peripheral right middle lobe nodule (series 3/image 76). Peripheral basilar right lower lobe 7 mm solid pulmonary nodule (series 3/image 114) is stable back to 08/04/2016. Subsolid left lower lobe 4 mm nodule (series 3/image 77) is stable back to 08/04/2016. No acute consolidative airspace disease or additional significant pulmonary nodules.  Musculoskeletal: No aggressive appearing focal osseous lesions. Mild thoracic spondylosis.  CT ABDOMEN FINDINGS  Hepatobiliary: Normal liver size. Stable simple 0.9 cm right liver dome cyst. At least 5 additional sub 5 mm hypodense lesions scattered throughout the liver are too small to characterize, and for which accurate comparison cannot be made to the prior noncontrast PET-CT images. Normal gallbladder with no  radiopaque cholelithiasis. No biliary ductal dilatation.  Pancreas: Normal, with no mass or duct dilation.  Spleen: Normal size. No mass.  Adrenals/Urinary Tract: Normal adrenals. Normal kidneys with no hydronephrosis and no renal mass.  Stomach/Bowel: Grossly normal stomach. Visualized small and large bowel is normal caliber, with no bowel wall thickening.  Vascular/Lymphatic: Atherosclerotic nonaneurysmal abdominal aorta. Patent portal, splenic, hepatic and renal veins. No pathologically enlarged lymph nodes in the abdomen.  Other: No pneumoperitoneum, ascites or focal fluid collection.  Musculoskeletal: No aggressive appearing focal osseous lesions. Mild lumbar spondylosis.  IMPRESSION: 1. Central right upper lobe lung mass has mildly decreased since 11/21/2016 chest CT angiogram and significantly decreased since 08/04/2016 PET-CT. 2. No pathologically enlarged thoracic lymph nodes. Previously described hypermetabolic thoracic adenopathy on the 08/04/2016 PET-CT has decreased in size. 3. Previously described bilateral lower lobe pulmonary nodules are stable in size. 4. New solitary subsolid 4 mm right middle lobe pulmonary nodule is nonspecific, and may be inflammatory. Recommend attention on a follow-up chest CT in 3 months. 5. No definite metastatic disease in the abdomen. Tiny sub 5 mm low-attenuation lesions in the liver are too small to characterize, favor benign. 6. Aortic atherosclerosis.  One vessel coronary atherosclerosis. 7. Moderate emphysema.   Electronically Signed   By: Ilona Sorrel M.D.   On: 12/30/2016 08:41       PATHOLOGY:    ASSESSMENT AND PLAN:  Stage IIIB NSCLC, poorly differentiated, favors adenocarcinoma S/P Concurrent ChemoXRT complicated by Radiation induced Esophagitis S/P 2 additional cycles of Carboplatin/Paclitaxel following concurrent chemoXRT Anxiety Cancer related pain Chemotherapy induced  anemia Constipation  PLAN: - Reviewed recent CT chest scan with the patient and her husband. - I have advised her to let me know if the cough worsens. She does not have evidence of pneumonitis on her recent scans though. - Proceed with Imfinzi treatment today. - I advised the patient to try OTC Gas X for flatus. She may use cough drops for cough. - I will refill Tramadol and Meloxicam. - RTC in 2 weeks for follow up and continued treatment.   All questions were answered. The patient knows to call the clinic with any problems, questions or concerns. We can certainly see the patient much sooner if necessary.  This document serves as a record of services personally performed by Twana First, MD. It was created on  her behalf by Maryla Morrow, a trained medical scribe. The creation of this record is based on the scribe's personal observations and the provider's statements to them. This document has been checked and approved by the attending provider.  I have reviewed the above documentation for accuracy and completeness and I agree with the above.  This note is electronically signed by:  Twana First, MD  04/11/2017 8:47 AM

## 2017-04-11 NOTE — Progress Notes (Signed)
Emily Pope tolerated chemo tx well without complaints or incident.Labs reviewed with Dr. Talbert Cage prior to administering Imfinzi VSS upon discharge. Pt discharged self ambulatory in satisfactory condition accompanied by her husband

## 2017-04-11 NOTE — Patient Instructions (Signed)
[facility] Discharge Instructions for Patients Receiving Chemotherapy   Beginning January 23rd 2017 lab work for the [facility] will be done in the  Main lab at [facility] on 1st floor. If you have a lab appointment with the [facility] please come in thru the  Main Entrance and check in at the main information desk   Today you received the following chemotherapy agents Imfinzi. Follow-up as scheduled. Call clinic for any questions or concerns  To help prevent nausea and vomiting after your treatment, we encourage you to take your nausea medication   If you develop nausea and vomiting, or diarrhea that is not controlled by your medication, call the clinic.  The clinic phone number is [phone]. Office hours are Monday-Friday 8:30am-5:00pm.  BELOW ARE SYMPTOMS THAT SHOULD BE REPORTED IMMEDIATELY:  *FEVER GREATER THAN 101.0 F  *CHILLS WITH OR WITHOUT FEVER  NAUSEA AND VOMITING THAT IS NOT CONTROLLED WITH YOUR NAUSEA MEDICATION  *UNUSUAL SHORTNESS OF BREATH  *UNUSUAL BRUISING OR BLEEDING  TENDERNESS IN MOUTH AND THROAT WITH OR WITHOUT PRESENCE OF ULCERS  *URINARY PROBLEMS  *BOWEL PROBLEMS  UNUSUAL RASH Items with * indicate a potential emergency and should be followed up as soon as possible. If you have an emergency after office hours please contact your primary care physician or go to the nearest emergency department.  Please call the clinic during office hours if you have any questions or concerns.   You may also contact the Patient Navigator at [phone] should you have any questions or need assistance in obtaining follow up care.      Resources For Cancer Patients and their Caregivers ? American Cancer Society: Can assist with transportation, wigs, general needs, runs Look Good Feel Better.        [phone] ? Cancer Care: Provides financial assistance, online support groups, medication/co-pay assistance.  1-800-813-HOPE  305-562-7817) ? [place] Assists [place] cancer patients and their families through emotional , educational and financial support.  6146714500 ? [place] DSS Where to apply for food stamps, Medicaid and utility assistance. 651-561-3409 ? RCATS: Transportation to medical appointments. [phone] ? Social Security Administration: May apply for disability if have a Stage IV cancer. [phone] 231-377-2845 ? [employer], Disability and Transit Services: Assists with nutrition, care and transit needs. [phone]

## 2017-04-25 ENCOUNTER — Encounter (HOSPITAL_BASED_OUTPATIENT_CLINIC_OR_DEPARTMENT_OTHER): Payer: Medicaid Other | Admitting: Oncology

## 2017-04-25 ENCOUNTER — Encounter (HOSPITAL_BASED_OUTPATIENT_CLINIC_OR_DEPARTMENT_OTHER): Payer: Medicaid Other

## 2017-04-25 DIAGNOSIS — C3411 Malignant neoplasm of upper lobe, right bronchus or lung: Secondary | ICD-10-CM

## 2017-04-25 DIAGNOSIS — J700 Acute pulmonary manifestations due to radiation: Secondary | ICD-10-CM | POA: Diagnosis not present

## 2017-04-25 DIAGNOSIS — J189 Pneumonia, unspecified organism: Secondary | ICD-10-CM

## 2017-04-25 LAB — CBC WITH DIFFERENTIAL/PLATELET
Basophils Absolute: 0 10*3/uL (ref 0.0–0.1)
Basophils Relative: 1 %
EOS PCT: 4 %
Eosinophils Absolute: 0.3 10*3/uL (ref 0.0–0.7)
HCT: 33.5 % — ABNORMAL LOW (ref 36.0–46.0)
Hemoglobin: 11.3 g/dL — ABNORMAL LOW (ref 12.0–15.0)
LYMPHS ABS: 0.7 10*3/uL (ref 0.7–4.0)
LYMPHS PCT: 10 %
MCH: 32.4 pg (ref 26.0–34.0)
MCHC: 33.7 g/dL (ref 30.0–36.0)
MCV: 96 fL (ref 78.0–100.0)
MONO ABS: 0.6 10*3/uL (ref 0.1–1.0)
MONOS PCT: 9 %
Neutro Abs: 5.1 10*3/uL (ref 1.7–7.7)
Neutrophils Relative %: 76 %
Platelets: 385 10*3/uL (ref 150–400)
RBC: 3.49 MIL/uL — ABNORMAL LOW (ref 3.87–5.11)
RDW: 12.8 % (ref 11.5–15.5)
WBC: 6.7 10*3/uL (ref 4.0–10.5)

## 2017-04-25 LAB — COMPREHENSIVE METABOLIC PANEL
ALT: 10 U/L — AB (ref 14–54)
AST: 14 U/L — ABNORMAL LOW (ref 15–41)
Albumin: 3.6 g/dL (ref 3.5–5.0)
Alkaline Phosphatase: 109 U/L (ref 38–126)
Anion gap: 7 (ref 5–15)
BUN: 12 mg/dL (ref 6–20)
CHLORIDE: 102 mmol/L (ref 101–111)
CO2: 29 mmol/L (ref 22–32)
CREATININE: 0.76 mg/dL (ref 0.44–1.00)
Calcium: 9 mg/dL (ref 8.9–10.3)
Glucose, Bld: 88 mg/dL (ref 65–99)
POTASSIUM: 4.1 mmol/L (ref 3.5–5.1)
Sodium: 138 mmol/L (ref 135–145)
Total Bilirubin: 0.4 mg/dL (ref 0.3–1.2)
Total Protein: 7.1 g/dL (ref 6.5–8.1)

## 2017-04-25 MED ORDER — PREDNISONE 10 MG PO TABS: ORAL_TABLET | ORAL | 0 refills | Status: DC

## 2017-04-25 MED ORDER — HEPARIN SOD (PORK) LOCK FLUSH 100 UNIT/ML IV SOLN
INTRAVENOUS | Status: AC
  Filled 2017-04-25: qty 5

## 2017-04-25 MED ORDER — ONDANSETRON HCL 8 MG PO TABS: 8.0000 mg | ORAL_TABLET | Freq: Two times a day (BID) | ORAL | 1 refills | Status: DC | PRN

## 2017-04-25 MED ORDER — HEPARIN SOD (PORK) LOCK FLUSH 100 UNIT/ML IV SOLN
500.0000 [IU] | Freq: Once | INTRAVENOUS | Status: AC
  Administered 2017-04-25: 500 [IU] via INTRAVENOUS

## 2017-04-25 NOTE — Patient Instructions (Addendum)
Emily Pope at [facility] Discharge Instructions  RECOMMENDATIONS MADE BY THE CONSULTANT AND ANY TEST RESULTS WILL BE SENT TO YOUR REFERRING PHYSICIAN.  You were seen today by Kirby Crigler PA-C. Refill given on prednisone, and zofran. Hold treatment today. Return in 2 weeks for labs, treatment and follow up.    Thank you for choosing [place] at [facility] to provide your oncology and hematology care.  To afford each patient quality time with our provider, please arrive at least 15 minutes before your scheduled appointment time.    If you have a lab appointment with the [facility] please come in thru the  Main Entrance and check in at the main information desk  You need to re-schedule your appointment should you arrive 10 or more minutes late.  We strive to give you quality time with our providers, and arriving late affects you and other patients whose appointments are after yours.  Also, if you no show three or more times for appointments you may be dismissed from the clinic at the providers discretion.     Again, thank you for choosing [facility].  Our Pope is that these requests will decrease the amount of time that you wait before being seen by our physicians.       _____________________________________________________________  Should you have questions after your visit to [facility], please contact our office at [phone] between the hours of 8:30 a.m. and 4:30 p.m.  Voicemails left after 4:30 p.m. will not be returned until the following business day.  For prescription refill requests, have your pharmacy contact our office.       Resources For Cancer Patients and their Caregivers ? American Cancer Society: Can assist with transportation, wigs, general needs, runs Look Good Feel Better.        205-716-6770 ? Cancer Care: Provides financial assistance, online support groups, medication/co-pay  assistance.  1-800-813-Pope (858)401-0716) ? [place] Assists [place] cancer patients and their families through emotional , educational and financial support.  343 622 3789 ? [place] DSS Where to apply for food stamps, Medicaid and utility assistance. 817-094-0102 ? [employer]ATS: Transportation to medical appointments. 503-871-5766 ? Social Security Administration: May apply for disability if have a Stage IV cancer. (858)543-4958 (636)051-0594 ? [employer], Disability and Transit Services: Assists with nutrition, care and transit needs. [place] Support Programs: '@10RELATIVEDAYS'$ @ > Cancer Support Group  2nd Tuesday of the month 1pm-2pm, Journey Room  > Creative Journey  3rd Tuesday of the month 1130am-1pm, Journey Room  > Look Good Feel Better  1st Wednesday of the month 10am-12 noon, Journey Room (Call [place] to register [phone])

## 2017-04-25 NOTE — Progress Notes (Signed)
  Labs drawn today from arm. No blood return from port.  Treatment held today per MD.

## 2017-04-25 NOTE — Assessment & Plan Note (Addendum)
Stage IIIB (T4N3M0) poorly differentiated carcinoma of right upper lobe of lung with immunophenotyping consistent with adenocarcinoma.  Status post concomitant chemo-radiation with carboplatin/paclitaxel (09/20/2016- 10/25/2016) followed by 2 cycles of consolidative chemotherapy with carboplatin/paclitaxel (11/15/2016- 12/06/2016).  Now on consolidative durvalumab (Imfinzi) beginning on 01/25/2017 for up to 52 weeks.  Oncology history updated.  Labs today: CBC with differential, CMET.  I personally reviewed and went over laboratory results with the patient.  The results are noted within this dictation.  Labs in 2 weeks: CBC diff, CMET, TSH.  I personally reviewed and went over radiographic studies with the patient.  The results are noted within this dictation.  I personally reviewed the images in PACS.  There is concern for possible pneumonitis on imaging.  She has seen GI in consultation for odynophagia and GERD.  She was prescribed a few medications, viscous lidocaine and PPI therapy at that time.  She does have follow-up in May 2018 with GI.  Due to her progression of cough and shortness of breath, I will hold immunotherapy today and prescribed 1 mg/kg of prednisone with a taper:  60 mg 7 days  40 mg 3 days  20 mg 3 days  10 mg 3 days  5 mg 3 days  STOP  I have refilled her Zofran as well.  Return in 2 weeks for follow-up and consideration of restart of therapy and reevaluation of symptoms.

## 2017-04-25 NOTE — Patient Instructions (Signed)
[place] at [facility] Discharge Instructions  RECOMMENDATIONS MADE BY THE CONSULTANT AND ANY TEST RESULTS WILL BE SENT TO YOUR REFERRING PHYSICIAN.   We are holding treatment for two weeks. Follow up as scheduled  Thank you for choosing [place] at [facility] to provide your oncology and hematology care.  To afford each patient quality time with our provider, please arrive at least 15 minutes before your scheduled appointment time.    If you have a lab appointment with the [facility] please come in thru the  Main Entrance and check in at the main information desk  You need to re-schedule your appointment should you arrive 10 or more minutes late.  We strive to give you quality time with our providers, and arriving late affects you and other patients whose appointments are after yours.  Also, if you no show three or more times for appointments you may be dismissed from the clinic at the providers discretion.     Again, thank you for choosing [facility].  Our hope is that these requests will decrease the amount of time that you wait before being seen by our physicians.       _____________________________________________________________  Should you have questions after your visit to [facility], please contact our office at [phone] between the hours of 8:30 a.m. and 4:30 p.m.  Voicemails left after 4:30 p.m. will not be returned until the following business day.  For prescription refill requests, have your pharmacy contact our office.       Resources For Cancer Patients and their Caregivers ? American Cancer Society: Can assist with transportation, wigs, general needs, runs Look Good Feel Better.        610-328-4016 ? Cancer Care: Provides financial assistance, online support groups, medication/co-pay assistance.  1-800-813-HOPE 551-666-5650) ? [place] Assists [place]  cancer patients and their families through emotional , educational and financial support.  571-460-7835 ? [place] DSS Where to apply for food stamps, Medicaid and utility assistance. 870-263-0183 ? [employer]ATS: Transportation to medical appointments. (279)483-8308 ? Social Security Administration: May apply for disability if have a Stage IV cancer. (253)145-1740 574-022-6755 ? [employer], Disability and Transit Services: Assists with nutrition, care and transit needs. [place] Support Programs: '@10RELATIVEDAYS'$ @ > Cancer Support Group  2nd Tuesday of the month 1pm-2pm, Journey Room  > Creative Journey  3rd Tuesday of the month 1130am-1pm, Journey Room  > Look Good Feel Better  1st Wednesday of the month 10am-12 noon, Journey Room (Call [place] to register [phone])

## 2017-04-25 NOTE — Progress Notes (Addendum)
Emily Chroman, MD [place] [address]  Primary cancer of right upper lobe of lung ([place]) - Plan: ondansetron (ZOFRAN) 8 MG tablet  Pneumonitis - Plan: predniSONE (DELTASONE) 10 MG tablet  CURRENT THERAPY: Consolidative therapy beginning on 01/25/2017 consisting of durvalumab (Imfinzi)  INTERVAL HISTORY: Emily Pope 61 y.o. 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  Status post concomitant chemo-radiation with carboplatin/paclitaxel (09/20/2016- 10/25/2016) followed by 2 cycles of consolidative chemotherapy with carboplatin/paclitaxel (11/15/2016- 12/06/2016).  Now on consolidative durvalumab (Imfinzi) beginning on 01/25/2017 for up to 52 weeks.    Primary cancer of right upper lobe of lung ([place])   08/04/2016 PET scan    PET IMPRESSION: 1. 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 Mass is confluent with the right superior hilum and right lower tracheal wall, suggestive of a T4 primary tumor. 2. Postobstructive pneumonia in the right upper lobe. 3. Hypermetabolic ipsilateral and contralateral mediastinal and contralateral hilar lymphadenopathy, suggestive of N3 nodal disease.      08/24/2016 Procedure    Bronchoscopy      08/25/2016 Pathology Results    Diagnosis Endobronchial biopsy, RUL - POORLY DIFFERENTIATED NON-SMALL CELL CARCINOMA. The immunophenotype is consistent with poorly differentiated adenocarcinoma.      09/10/2016 Imaging    MRI brain- No metastatic disease or acute intracranial abnormality.      09/20/2016 - 10/25/2016 Chemotherapy    Carboplatin/Paclitaxel weekly x 6 with XRT.      09/20/2016 - 10/25/2016 Radiation Therapy         11/15/2016 - 12/06/2016 Chemotherapy   Carboplatin/paclitaxel every 21 days x 2 cycles.      12/30/2016 Imaging    CT CAP- 1. Central right upper lobe lung mass has mildly decreased since 11/21/2016 chest CT angiogram and significantly decreased since 08/04/2016 PET-CT. 2. No pathologically enlarged thoracic lymph nodes. Previously described hypermetabolic thoracic adenopathy on the 08/04/2016 PET-CT has decreased in size. 3. Previously described bilateral lower lobe pulmonary nodules are stable in size. 4. New solitary subsolid 4 mm right middle lobe pulmonary nodule is nonspecific, and may be inflammatory. Recommend attention on a follow-up chest CT in 3 months. 5. No definite metastatic disease in the abdomen. Tiny sub 5 mm low-attenuation lesions in the liver are too small to characterize, favor benign. 6. Aortic atherosclerosis. One vessel coronary atherosclerosis. 7. Moderate emphysema.      01/25/2017 -  Chemotherapy    Durvalumab (Imfinzi) x 52 weeks.       03/31/2017 Imaging    CT chest with contrast: Stable size of partially calcified mass in the posterior right upper lobe.  Increased airspace disease in paramediastinal right upper and superior right lower lobes. Differential diagnosis includes radiation pneumonitis, drug reaction, and infection.  Stable sub-cm right lower lobe pulmonary nodule.  No evidence of lymphadenopathy or pleural effusion.  Emphysema.  Aortic and coronary artery atherosclerosis.      04/25/2017 Adverse Reaction    Progressive cough, concerning for pneumonitis.      04/25/2017 Treatment Plan Change    Progressive cough concerning for pneumonitis.  Treatment held.  Managed by steroids.       HPI Elements NSCLC  Location: right upper lobe  Quality:   Severity: Stage IIIB, measuring 7.9 cm  Duration: Dx on 08/25/2016  Context: In setting of tobacco abuse  Timing:   Modifying Factors: Chronic lung disease at baseline  Associated Signs & Symptoms: Cough, weight  loss, chest pain secondary to radiation esophagitis, shortness of breath.    HPI Elements Cough  Location: Lungs  Quality:   Severity: Progressive.  Occurs throughout day and night.  Duration: 2017, but more recently worse.  Context:   Timing: All day  Modifying Factors: No hemoptysis.  Resistant to PO cough suppression management  Associated Signs & Symptoms: Cough, dry, nonproductive.  Difficulty sleeping.    She reports worsening of her cough and prescribed anti-tussives have not been beneficial.  She denies hemoptysis.  Cough is nonproductive.  Afebrile.    Appetite is stable.  Weight is stable.  Review of Systems  Constitutional: Negative.  Negative for chills, fever and weight loss.  HENT: Negative.   Eyes: Negative.   Respiratory: Positive for cough and shortness of breath.   Cardiovascular: Negative.  Negative for chest pain.  Gastrointestinal: Positive for nausea. Negative for blood in stool, constipation, diarrhea, melena and vomiting.  Genitourinary: Negative.   Musculoskeletal: Negative.   Skin: Negative.   Neurological: Negative.  Negative for weakness.  Endo/Heme/Allergies: Negative.   Psychiatric/Behavioral: Negative.     Past Medical History:  Diagnosis Date  . Anxiety    uses it for menopause   . Arthritis   . Cancer ([place])    Right Lung  . Chronic bronchitis ([place])   . COPD (chronic obstructive pulmonary disease) ([place])   . GERD (gastroesophageal reflux disease)   . Headache    sinus  . Hypertension   . Pneumonia    07/22/16-had pneumonia in right lung and found mass in lung  . Primary cancer of right upper lobe of lung ([place]) 08/23/2016    Past Surgical History:  Procedure Laterality Date  . ABDOMINAL HYSTERECTOMY    . APPENDECTOMY     when had hysterectomy  . CARPAL TUNNEL RELEASE Bilateral   . ENDOBRONCHIAL ULTRASOUND Bilateral 08/23/2016   Procedure: ENDOBRONCHIAL ULTRASOUND;  Surgeon: Collene Gobble, MD;  Location: [facility];  Service:  Cardiopulmonary;  Laterality: Bilateral;  . HERNIA REPAIR     left inguinal hernia age 6  . PORTACATH PLACEMENT Left 09/17/2016   Procedure: INSERTION PORT-A-CATH LEFT SUBCLAVIAN;  Surgeon: Aviva Signs, MD;  Location: [facility];  Service: General;  Laterality: Left;  . TUBAL LIGATION      Family History  Problem Relation Age of Onset  . Cancer Mother     lung  . Hypertension Mother   . Hypertension Father   . Diabetes Father   . Hypertension Sister   . Cancer Brother     lung  . Hypertension Sister   . Colon cancer Neg Hx     Social History   Social History  . Marital status: Divorced    Spouse name: N/A  . Number of children: N/A  . Years of education: N/A   Social History Main Topics  . Smoking status: Former Smoker    Packs/day: 2.00    Years: 43.00    Quit date: 07/20/2016  . Smokeless tobacco: Never Used  . Alcohol use No  . Drug use: No  . Sexual activity: Not on file   Other Topics Concern  . Not on file   Social History Narrative  . No narrative on file     PHYSICAL EXAMINATION  ECOG PERFORMANCE STATUS: 1 - Symptomatic but completely ambulatory  There were no vitals  filed for this visit.  Vitals - 1 value per visit 9/38/1017  SYSTOLIC 510  DIASTOLIC 83  Pulse 97  Temperature 97.6  Respirations 18  Weight (lb) 117    GENERAL:alert, no distress, well nourished, well developed, comfortable, cooperative, smiling and coughing, accompanied by husband, in chemo-recliner. SKIN: skin color, texture, turgor are normal, no rashes or significant lesions HEAD: Normocephalic, No masses, lesions, tenderness or abnormalities EYES: normal, EOMI EARS: External ears normal OROPHARYNX:lips, buccal mucosa, and tongue normal  NECK: supple, trachea midline LYMPH:  no palpable lymphadenopathy BREAST:not examined LUNGS: clear to auscultation and percussion, dry cough during exam and discussion. HEART: regular rate & rhythm, no murmurs, no gallops, S1 normal and S2  normal ABDOMEN:abdomen soft and normal bowel sounds BACK: Back symmetric, no curvature. EXTREMITIES:less then 2 second capillary refill, no joint deformities, effusion, or inflammation, no skin discoloration, no cyanosis  NEURO: alert & oriented x 3 with fluent speech, no focal motor/sensory deficits, gait normal   LABORATORY DATA: CBC    Component Value Date/Time   WBC 6.7 04/25/2017 1008   RBC 3.49 (L) 04/25/2017 1008   HGB 11.3 (L) 04/25/2017 1008   HGB 12.9 09/02/2016 1403   HCT 33.5 (L) 04/25/2017 1008   HCT 38.4 09/02/2016 1403   PLT 385 04/25/2017 1008   PLT 476 (H) 09/02/2016 1403   MCV 96.0 04/25/2017 1008   MCV 94.6 09/02/2016 1403   MCH 32.4 04/25/2017 1008   MCHC 33.7 04/25/2017 1008   RDW 12.8 04/25/2017 1008   RDW 13.6 09/02/2016 1403   LYMPHSABS 0.7 04/25/2017 1008   LYMPHSABS 2.5 09/02/2016 1403   MONOABS 0.6 04/25/2017 1008   MONOABS 0.7 09/02/2016 1403   EOSABS 0.3 04/25/2017 1008   EOSABS 0.7 (H) 09/02/2016 1403   BASOSABS 0.0 04/25/2017 1008   BASOSABS 0.1 09/02/2016 1403      Chemistry      Component Value Date/Time   NA 138 04/25/2017 1008   NA 140 09/02/2016 1403   K 4.1 04/25/2017 1008   K 4.2 09/02/2016 1403   CL 102 04/25/2017 1008   CO2 29 04/25/2017 1008   CO2 27 09/02/2016 1403   BUN 12 04/25/2017 1008   BUN 15.6 09/02/2016 1403   CREATININE 0.76 04/25/2017 1008   CREATININE 0.8 09/02/2016 1403      Component Value Date/Time   CALCIUM 9.0 04/25/2017 1008   CALCIUM 9.6 09/02/2016 1403   ALKPHOS 109 04/25/2017 1008   ALKPHOS 159 (H) 09/02/2016 1403   AST 14 (L) 04/25/2017 1008   AST 12 09/02/2016 1403   ALT 10 (L) 04/25/2017 1008   ALT <9 09/02/2016 1403   BILITOT 0.4 04/25/2017 1008   BILITOT <0.30 09/02/2016 1403     Lab Results  Component Value Date   TSH 3.266 04/11/2017     PENDING LABS:   RADIOGRAPHIC STUDIES:  Dg Chest 2 View  Result Date: 03/28/2017 CLINICAL DATA:  Short of breath, cough for 3 weeks, history  of right upper lobe lung carcinoma EXAM: CHEST  2 VIEW COMPARISON:  CT chest of 12/29/2016 and chest x-ray of 11/21/2016 FINDINGS: Considerable opacity remains within the partially atelectatic right upper lobe at the site of the tumor which has been treated with subsequent scarring present. Otherwise the lungs are clear and slightly hyperaerated. The heart is within normal limits in size. A left-sided Port-A-Cath is present with tip overlying the upper SVC. No acute bony abnormality is seen. IMPRESSION: 1. Opacity within the partially atelectatic right  upper lobe probably represents treated tumor and scarring. 2. No new parenchymal abnormality is seen. Electronically Signed   By: Ivar Drape M.D.   On: 03/28/2017 13:13   Ct Chest W Contrast  Result Date: 03/31/2017 CLINICAL DATA:  Followup stage IIIB adenocarcinoma of right upper lobe. Ongoing immunotherapy. Increased shortness of breath for 3 weeks. Previous radiation therapy and chemotherapy. EXAM: CT CHEST WITH CONTRAST TECHNIQUE: Multidetector CT imaging of the chest was performed during intravenous contrast administration. CONTRAST:  58m ISOVUE-300 IOPAMIDOL (ISOVUE-300) INJECTION 61% COMPARISON:  12/29/2016 FINDINGS: Cardiovascular: No acute findings. Aortic and coronary artery atherosclerosis. Mediastinum/Nodes: No masses or pathologically enlarged lymph nodes identified. Scattered sub-cm mediastinal lymph nodes remain stable. Lungs/Pleura: Soft tissue mass with diffuse internal calcifications in the posterior right upper lobe measures 3.9 cm on image 35/2, and is not significantly changed in size compared to previous study. Increased airspace disease is seen in the adjacent paramediastinal right upper and superior right lower lobes. Subpleural nodule in the lateral right lung base measures 7 mm on image 114/4, and is stable compared with previous study. No new or enlarging pulmonary nodules or masses identified. Mild to moderate emphysema again noted. No  evidence of pleural effusion. Upper Abdomen: Normal adrenal glands. Stable sub-cm low-attenuation lesions in the liver are stable. Small diverticulum from the posterior gastric fundus is stable. Musculoskeletal:  No suspicious bone lesions. IMPRESSION: Stable size of partially calcified mass in the posterior right upper lobe. Increased airspace disease in paramediastinal right upper and superior right lower lobes. Differential diagnosis includes radiation pneumonitis, drug reaction, and infection. Stable sub-cm right lower lobe pulmonary nodule. No evidence of lymphadenopathy or pleural effusion. Emphysema. Aortic and coronary artery atherosclerosis. Electronically Signed   By: JEarle GellM.D.   On: 03/31/2017 14:04     PATHOLOGY:    ASSESSMENT AND PLAN:  [place] Stage IIIB (T4N3M0) poorly differentiated carcinoma of right upper lobe of lung with immunophenotyping consistent with adenocarcinoma.  Status post concomitant chemo-radiation with carboplatin/paclitaxel (09/20/2016- 10/25/2016) followed by 2 cycles of consolidative chemotherapy with carboplatin/paclitaxel (11/15/2016- 12/06/2016).  Now on consolidative durvalumab (Imfinzi) beginning on 01/25/2017 for up to 52 weeks.  Oncology history updated.  Labs today: CBC with differential, CMET.  I personally reviewed and went over laboratory results with the patient.  The results are noted within this dictation.  Labs in 2 weeks: CBC diff, CMET, TSH.  I personally reviewed and went over radiographic studies with the patient.  The results are noted within this dictation.  I personally reviewed the images in PACS.  There is concern for possible pneumonitis on imaging.  She has seen GI in consultation for odynophagia and GERD.  She was prescribed a few medications, viscous lidocaine and PPI therapy at that time.  She does have follow-up in May 2018 with GI.  Due to her progression of cough and shortness of  breath, I will hold immunotherapy today and prescribed 1 mg/kg of prednisone with a taper:  60 mg 7 days  40 mg 3 days  20 mg 3 days  10 mg 3 days  5 mg 3 days  STOP  I have refilled her Zofran as well.  Return in 2 weeks for follow-up and consideration of restart of therapy and reevaluation of symptoms.   Number of Diagnoses or Treatment Options- Section A:      Problems to Exam Physician Problem(s) Number x Points= Results  Self-limited or minor (stable, improved, or worsening)  Max=2  1   Est. Problem (to examiner); stable, improved   1 1  Est. Problem (to examiner); worsening   2 2  New problem (to examiner); no additional work-up planned  Max='1 3 3  '$ New problem (to examiner); add. work-up planned   4      Total: 6    Amount and/or Complexity of Data to be Reviewed- Section B    Data to be reviewed: Points    Review and/or order of clinical lab tests 1  '[x]'$    Review and/or order of tests in the radiology section of CPT (includes nuclear med & other except cardiac cath & ECG) 1  '[]'$    Review and/or order of tests in the medicine section of CPT (e.g. EKG, cardiac cath, non-invasive vascular studies, pulmonary function studies) 1  '[]'$    Discussion of test results with performing physician 1  '[]'$    Decision to obtain old records and/or obtaining history from someone other than patient 1  '[]'$    Review and summarization of old records and/or obtaining history from someone other than patient and/or discussion of case with another health care provider 2  '[x]'$    Independent visualization of image, tracing, or specimen itself (not simply review report) 2  '[x]'$    Total:  5    Risk of  complications and/or Morbidity or Mortality- Section C  Level of Risk: Presenting Problem(s) Diagnostic Procedure(s) Ordered Management Options Selected  Minimal One self-limited or minor problem (eg cold, insect bite, tinea corporis)  Lab test requiring venipuncture  Chest xray  EKG/EEG  [place] or  Echo  KOH prep  Urinalysis  Rest  Gargles  Elastic bandages  Superficial dressings  Low  Two or more self-limited or minor problems  One stable chronic illness (well-controlled HTN, non-insulin dependent diabetes, cataract, BPH)  Acute uncomplicated illness or injury (cystitis, allergic rhinitis, simple sprain)  Physiologic test not under stress (pulm fnx tests)  Non-cardiovascular imaging studies with contrast (barium enema)  Superficial needle biopsy  Clinical laboratory tests requiring arterial puncture  Skin biopsies  OTC drugs  Minor surgery with no identified risk factors  Physical therapy  Occupational therapy  IV fluids without additives  Moderate  One or more chronic illnesses with mild exacerbation, progression, or side effects of treatment  Two or more stable chronic illnesses  Undiagnosed new problem with uncertain prognosis (lump in breast)  Acute illness with systemic symptoms (pyelonephritis, pneumonitis, colitis)  Acute complicated injury (head injury with brief loss of consciousness)  Physiologic test under stress (cardiac stress test, fetal contraction stress test)  Diagnostic endoscopies with no identified risk factors  Deep needle or incisional biopsy  Cardiovascular imaging studies with contrast and no identified risk factors (arteriogram, cardiac cath)  Obtain fluid from body cavity (LP, thoracentesis, culdocentesis)  Minor surgery with identified risk factors  Elective surgery (open, percutaneous, or endoscopic) with no identified risk factors  Prescription drug management  Therapeutic nuclear medicine  IV fluids with additives  Closed treatment of fracture of dislocation without manipulation  High  One or more chronic illnesses with severe exacerbation, progression, or side effects of treatment.  Acute or chronic illnesses or injuries that may pose a threat to life or bodily function (multiple trauma, acute MI, PE, severe  respiratory distress, progressive severe rheumatoid arthritis, psychiatric illness with potential threat to self or others, peritonitis, acute renal failure)  An abrupt change in neurological status (seizure, TIA, weakness, sensory loss)  Cardiovascular imaging studies with contrast with identified  risk factors  Cardiac electrophysiological tests  Diagnostic endoscopies with identified risk factors  Discography   Elective major surgery (open, percutaneous, or endoscopic) with identified risk factor  Emergency major surgery (open, percutaneous, or endoscopic)  Parental controlled substances  Drug therapy requiring intensive monitoring for toxicity  Decision not to resuscitate or deescalate care because of poor prognosis     Final Result of Complexity      Choose decision making level with 2 or 3 checks OR choose the decision making level on Section B       A Number of diagnoses or treatment options  '[]'$   </= 1 Minimal  '[]'$   2 Limited  '[]'$   3 Multiple  '[x]'$   >/= 4 Extensive  B Amount and complexity of data  '[]'$   </= 1 Minimal or low  '[]'$   2 Limited  '[]'$   3  Moderate  '[x]'$   >/= 4 Extensive  C Highest risk  '[]'$   Minimal  '[]'$   Low  '[]'$   Moderate  '[x]'$   High   Type of decision making  '[]'$   Straight-forward  '[]'$   Low Complexity  '[]'$   Moderate- Complexity  '[x]'$   High- Complexity      ORDERS PLACED FOR THIS ENCOUNTER: No orders of the defined types were placed in this encounter.   MEDICATIONS PRESCRIBED THIS ENCOUNTER: Meds ordered this encounter  Medications  . predniSONE (DELTASONE) 10 MG tablet    Sig: Take 60 mg PO 7 days, 40 mg 3 days, 20 mg 3 days, 10 mg 3 days, 5 mg 3 days, STOP    Dispense:  65 tablet    Refill:  0    Order Specific Question:   Supervising Provider    Answer:   Brunetta Genera [[id number]]  . ondansetron (ZOFRAN) 8 MG tablet    Sig: Take 1 tablet (8 mg total) by mouth 2 (two) times daily as needed for refractory nausea / vomiting. Start on  day 3 after chemo.    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  All questions were answered. The patient knows to call the clinic with any problems, questions or concerns. We can certainly see the patient much sooner if necessary.  Patient and plan discussed with Dr. Twana First and she is in agreement with the aforementioned.   This note is electronically signed by: Doy Mince 04/25/2017 5:41 PM

## 2017-04-26 ENCOUNTER — Ambulatory Visit: Payer: Medicaid Other | Admitting: Gastroenterology

## 2017-04-28 ENCOUNTER — Telehealth (HOSPITAL_COMMUNITY): Payer: Self-pay | Admitting: Oncology

## 2017-04-28 NOTE — Telephone Encounter (Signed)
Emailed Lattie Haw H @ [employer] to get clarification of pts bill of $[phone].

## 2017-05-09 ENCOUNTER — Ambulatory Visit (HOSPITAL_COMMUNITY): Payer: Medicaid Other

## 2017-05-09 ENCOUNTER — Encounter (HOSPITAL_COMMUNITY): Payer: Self-pay | Admitting: Oncology

## 2017-05-09 ENCOUNTER — Encounter (HOSPITAL_COMMUNITY): Payer: Medicaid Other | Attending: Hematology & Oncology

## 2017-05-09 ENCOUNTER — Encounter (HOSPITAL_BASED_OUTPATIENT_CLINIC_OR_DEPARTMENT_OTHER): Payer: Medicaid Other | Admitting: Oncology

## 2017-05-09 VITALS — BP 165/94 | HR 89 | Temp 98.6°F | Resp 16

## 2017-05-09 DIAGNOSIS — C3411 Malignant neoplasm of upper lobe, right bronchus or lung: Secondary | ICD-10-CM

## 2017-05-09 DIAGNOSIS — Z5112 Encounter for antineoplastic immunotherapy: Secondary | ICD-10-CM

## 2017-05-09 DIAGNOSIS — Z9221 Personal history of antineoplastic chemotherapy: Secondary | ICD-10-CM | POA: Diagnosis not present

## 2017-05-09 DIAGNOSIS — J984 Other disorders of lung: Secondary | ICD-10-CM

## 2017-05-09 DIAGNOSIS — J189 Pneumonia, unspecified organism: Secondary | ICD-10-CM

## 2017-05-09 DIAGNOSIS — Z452 Encounter for adjustment and management of vascular access device: Secondary | ICD-10-CM | POA: Diagnosis present

## 2017-05-09 HISTORY — DX: Other disorders of lung: J98.4

## 2017-05-09 HISTORY — DX: Pneumonia, unspecified organism: J18.9

## 2017-05-09 LAB — COMPREHENSIVE METABOLIC PANEL
ALT: 18 U/L (ref 14–54)
ANION GAP: 8 (ref 5–15)
AST: 15 U/L (ref 15–41)
Albumin: 3.7 g/dL (ref 3.5–5.0)
Alkaline Phosphatase: 112 U/L (ref 38–126)
BILIRUBIN TOTAL: 0.6 mg/dL (ref 0.3–1.2)
BUN: 20 mg/dL (ref 6–20)
CO2: 30 mmol/L (ref 22–32)
Calcium: 8.9 mg/dL (ref 8.9–10.3)
Chloride: 100 mmol/L — ABNORMAL LOW (ref 101–111)
Creatinine, Ser: 0.92 mg/dL (ref 0.44–1.00)
Glucose, Bld: 83 mg/dL (ref 65–99)
POTASSIUM: 4.2 mmol/L (ref 3.5–5.1)
Sodium: 138 mmol/L (ref 135–145)
TOTAL PROTEIN: 6.9 g/dL (ref 6.5–8.1)

## 2017-05-09 LAB — CBC WITH DIFFERENTIAL/PLATELET
BASOS ABS: 0 10*3/uL (ref 0.0–0.1)
BASOS PCT: 0 %
EOS ABS: 0.2 10*3/uL (ref 0.0–0.7)
Eosinophils Relative: 1 %
HEMATOCRIT: 37.9 % (ref 36.0–46.0)
Hemoglobin: 12.5 g/dL (ref 12.0–15.0)
Lymphocytes Relative: 13 %
Lymphs Abs: 1.5 10*3/uL (ref 0.7–4.0)
MCH: 32.1 pg (ref 26.0–34.0)
MCHC: 33 g/dL (ref 30.0–36.0)
MCV: 97.2 fL (ref 78.0–100.0)
Monocytes Absolute: 0.8 10*3/uL (ref 0.1–1.0)
Monocytes Relative: 7 %
NEUTROS PCT: 79 %
Neutro Abs: 8.9 10*3/uL — ABNORMAL HIGH (ref 1.7–7.7)
Platelets: 331 10*3/uL (ref 150–400)
RBC: 3.9 MIL/uL (ref 3.87–5.11)
RDW: 14.6 % (ref 11.5–15.5)
WBC: 11.4 10*3/uL — AB (ref 4.0–10.5)

## 2017-05-09 LAB — TSH: TSH: 4.011 u[IU]/mL (ref 0.350–4.500)

## 2017-05-09 MED ORDER — HEPARIN SOD (PORK) LOCK FLUSH 100 UNIT/ML IV SOLN
500.0000 [IU] | Freq: Once | INTRAVENOUS | Status: AC | PRN
  Administered 2017-05-09: 500 [IU]

## 2017-05-09 MED ORDER — ALTEPLASE 2 MG IJ SOLR
2.0000 mg | Freq: Once | INTRAMUSCULAR | Status: AC | PRN
  Administered 2017-05-09: 2 mg

## 2017-05-09 MED ORDER — SODIUM CHLORIDE 0.9 % IV SOLN
9.7000 mg/kg | Freq: Once | INTRAVENOUS | Status: AC
  Administered 2017-05-09: 500 mg via INTRAVENOUS
  Filled 2017-05-09: qty 10

## 2017-05-09 MED ORDER — ONDANSETRON 8 MG PO TBDP
ORAL_TABLET | ORAL | Status: AC
  Filled 2017-05-09: qty 1

## 2017-05-09 MED ORDER — STERILE WATER FOR INJECTION IJ SOLN
INTRAMUSCULAR | Status: AC
  Filled 2017-05-09: qty 10

## 2017-05-09 MED ORDER — SODIUM CHLORIDE 0.9% FLUSH
10.0000 mL | INTRAVENOUS | Status: DC | PRN
  Administered 2017-05-09: 10 mL
  Filled 2017-05-09: qty 10

## 2017-05-09 MED ORDER — SODIUM CHLORIDE 0.9 % IV SOLN
Freq: Once | INTRAVENOUS | Status: AC
  Administered 2017-05-09: 12:00:00 via INTRAVENOUS

## 2017-05-09 MED ORDER — ALTEPLASE 2 MG IJ SOLR
INTRAMUSCULAR | Status: AC
  Filled 2017-05-09: qty 2

## 2017-05-09 MED ORDER — ONDANSETRON 8 MG PO TBDP
8.0000 mg | ORAL_TABLET | Freq: Once | ORAL | Status: AC
  Administered 2017-05-09: 8 mg via ORAL

## 2017-05-09 NOTE — Assessment & Plan Note (Addendum)
Imfinzi-induced pneumonitis, Grade 2, resulting in corticosteroid, prednisone, treatment and holding of immunotherapy from 04/25/2017- 05/09/2017.  Resolution/improvement to grade 1 noted.  Immunotherapy restarted on 05/09/2017.  She is currently finishing her taper of prednisone.  She is on 10 mg today.  Tomorrow, she goes down to 5 mg for 3 days.  Since her pneumonitis is back to grade 1 and she is taking 10 mg of prednisone, we will restart Imfinzi.

## 2017-05-09 NOTE — Progress Notes (Signed)
Emily Chroman, MD [place] [address]  Primary cancer of right upper lobe of lung [facility])  Pneumonitis  CURRENT THERAPY: Consolidative therapy beginning on 01/25/2017 consisting of durvalumab (Imfinzi).  On HOLD since 04/25/2017 due to concern for pneumonitis.  INTERVAL HISTORY: Emily Pope 61 y.o. 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  S/P concomitant chemoradiation with carboplatin/paclitaxel (09/20/2016- 10/25/2016) followed by 2 cycle of consolidative chemotherapy with carboplatin/paclitaxel (11/15/2016- 12/06/2016).  Now on consolidative durvalumab (Imfinzi) beginning on 01/26/2016 for up to 52 weeks.   AND Imfinzi-induced pneumonitis, grade 2, resulting in holding Imfinzi from 04/25/2017- 05/09/2017 with corticosteroids treatment with taper.  This resolved/improved with steroids.  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  S/P concomitant chemoradiation with carboplatin/paclitaxel (09/20/2016- 10/25/2016) followed by 2 cycle of consolidative chemotherapy with carboplatin/paclitaxel (11/15/2016- 12/06/2016).  Now on consolidative durvalumab (Imfinzi) beginning on 01/26/2016 for up to 52 weeks.   AND Imfinzi-induced pneumonitis, grade 2, resulting in holding Imfinzi from 04/25/2017- 05/09/2017 with corticosteroids treatment with taper.  This resolved/improved with steroids.      Primary cancer of right upper lobe of lung ([facility])   08/04/2016 PET scan    PET IMPRESSION: 1. 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 Mass is confluent with the right superior hilum and right lower tracheal wall, suggestive of a T4 primary tumor. 2. Postobstructive pneumonia in the right  upper lobe. 3. Hypermetabolic ipsilateral and contralateral mediastinal and contralateral hilar lymphadenopathy, suggestive of N3 nodal disease.      08/24/2016 Procedure    Bronchoscopy      08/25/2016 Pathology Results    Diagnosis Endobronchial biopsy, RUL - POORLY DIFFERENTIATED NON-SMALL CELL CARCINOMA. The immunophenotype is consistent with poorly differentiated adenocarcinoma.      09/10/2016 Imaging    MRI brain- No metastatic disease or acute intracranial abnormality.      09/20/2016 - 10/25/2016 Chemotherapy    Carboplatin/Paclitaxel weekly x 6 with XRT.      09/20/2016 - 10/25/2016 Radiation Therapy         11/15/2016 - 12/06/2016 Chemotherapy    Carboplatin/paclitaxel every 21 days x 2 cycles.      12/30/2016 Imaging    CT CAP- 1. Central right upper lobe lung mass has mildly decreased since 11/21/2016 chest CT angiogram and significantly decreased since 08/04/2016 PET-CT. 2. No pathologically enlarged thoracic lymph nodes. Previously described hypermetabolic thoracic adenopathy on the 08/04/2016 PET-CT has decreased in size. 3. Previously described bilateral lower lobe pulmonary nodules are stable in size. 4. New solitary subsolid 4 mm right middle lobe pulmonary nodule is nonspecific, and may be inflammatory. Recommend attention on a follow-up chest CT in 3 months. 5. No definite metastatic disease in the abdomen. Tiny sub 5 mm low-attenuation lesions in the liver are too small to characterize, favor benign. 6. Aortic atherosclerosis. One vessel coronary atherosclerosis. 7. Moderate emphysema.      01/25/2017 -  Chemotherapy    Durvalumab (Imfinzi) x 52 weeks.       03/31/2017 Imaging    CT chest with contrast: Stable size of partially calcified mass in the posterior right upper lobe.  Increased airspace disease in paramediastinal right upper and superior right lower lobes. Differential diagnosis includes radiation pneumonitis, drug reaction,  and  infection.  Stable sub-cm right lower lobe pulmonary nodule.  No evidence of lymphadenopathy or pleural effusion.  Emphysema.  Aortic and coronary artery atherosclerosis.      04/25/2017 Adverse Reaction    Progressive cough, concerning for pneumonitis.      04/25/2017 Treatment Plan Change    Progressive cough concerning for pneumonitis.  Treatment held.  Managed by steroids.      05/09/2017 Treatment Plan Change    Restart Imfinzi with improvement of cough, Grade 1.        HPI Elements NSCLC  Location: RUL  Quality: Adenocarcinoma  Severity: Stage IIIB (T4N3M0)  Duration: Dx in 08/2016  Context: Tobacco abuse  Timing:   Modifying Factors: Progressive cough on immunotherapy, trial of steroids with holding of therapy from 04/25/2016- 05/09/2017  Associated Signs & Symptoms: Cough, SOB   She notes significant improvement in her cough.  She notes a persistent dry cough, but she blames this on seasonal allergies.  She is now back to a GRADE 1 pneumonitis and at the 10 mg dose of prednisone.  She notes an "itchy" cough and it "tickles."  Her appetite and energy level is stable.  She notes an improvement in her appetite with steroids.  Weight is stable.  She reports a left chest wall vein that is visibly noticeable and new.   Review of Systems  Constitutional: Negative.  Negative for chills, fever and weight loss.  HENT: Negative.   Eyes: Negative.   Respiratory: Positive for cough (improved).   Cardiovascular: Negative.  Negative for chest pain.  Gastrointestinal: Negative.  Negative for blood in stool, constipation, diarrhea, melena, nausea and vomiting.  Genitourinary: Negative.   Musculoskeletal: Negative.   Skin: Negative.   Neurological: Negative.  Negative for weakness.  Endo/Heme/Allergies: Negative.   Psychiatric/Behavioral: Negative.     Past Medical History:  Diagnosis Date  . Anxiety    uses it for menopause   . Arthritis   . Cancer ([place])    Right  Lung  . Chronic bronchitis ([place])   . COPD (chronic obstructive pulmonary disease) ([place])   . GERD (gastroesophageal reflux disease)   . Headache    sinus  . Hypertension   . Pneumonia    07/22/16-had pneumonia in right lung and found mass in lung  . Pneumonitis 05/09/2017  . Primary cancer of right upper lobe of lung ([place]) 08/23/2016    Past Surgical History:  Procedure Laterality Date  . ABDOMINAL HYSTERECTOMY    . APPENDECTOMY     when had hysterectomy  . CARPAL TUNNEL RELEASE Bilateral   . ENDOBRONCHIAL ULTRASOUND Bilateral 08/23/2016   Procedure: ENDOBRONCHIAL ULTRASOUND;  Surgeon: Collene Gobble, MD;  Location: [facility];  Service: Cardiopulmonary;  Laterality: Bilateral;  . HERNIA REPAIR     left inguinal hernia age 38  . PORTACATH PLACEMENT Left 09/17/2016   Procedure: INSERTION PORT-A-CATH LEFT SUBCLAVIAN;  Surgeon: Aviva Signs, MD;  Location: [facility];  Service: General;  Laterality: Left;  . TUBAL LIGATION      Family History  Problem Relation Age of Onset  . Cancer Mother        lung  . Hypertension Mother   . Hypertension Father   . Diabetes Father   . Hypertension Sister   . Cancer Brother        lung  . Hypertension Sister   . Colon cancer Neg Hx     Social History   Social History  . Marital status: Divorced  Spouse name: N/A  . Number of children: N/A  . Years of education: N/A   Social History Main Topics  . Smoking status: Former Smoker    Packs/day: 2.00    Years: 43.00    Quit date: 07/20/2016  . Smokeless tobacco: Never Used  . Alcohol use No  . Drug use: No  . Sexual activity: Not Asked   Other Topics Concern  . None   Social History Narrative  . None     PHYSICAL EXAMINATION  ECOG PERFORMANCE STATUS: 1 - Symptomatic but completely ambulatory  Vitals:   05/09/17 0922  BP: (!) 150/83  Pulse: 93  Resp: 18  Temp: 98.2 F (36.8 C)    GENERAL:alert, no distress, well nourished, well developed, comfortable, cooperative,  smiling and unaccompanied SKIN: skin color, texture, turgor are normal, no rashes or significant lesions, left chest vein that is visible without tenderness or erythema around her port HEAD: Normocephalic, No masses, lesions, tenderness or abnormalities EYES: normal, EOMI, Conjunctiva are pink and non-injected EARS: External ears normal OROPHARYNX:lips, buccal mucosa, and tongue normal and mucous membranes are moist  NECK: supple, trachea midline LYMPH:  no palpable lymphadenopathy BREAST:not examined LUNGS: clear to auscultation  HEART: regular rate & rhythm, no murmurs and no gallops ABDOMEN:abdomen soft, non-tender and normal bowel sounds BACK: Back symmetric, no curvature. EXTREMITIES:less then 2 second capillary refill, no joint deformities, effusion, or inflammation, no edema, no skin discoloration, no cyanosis  NEURO: alert & oriented x 3 with fluent speech, no focal motor/sensory deficits, gait normal   LABORATORY DATA: CBC    Component Value Date/Time   WBC 6.7 04/25/2017 1008   RBC 3.49 (L) 04/25/2017 1008   HGB 11.3 (L) 04/25/2017 1008   HGB 12.9 09/02/2016 1403   HCT 33.5 (L) 04/25/2017 1008   HCT 38.4 09/02/2016 1403   PLT 385 04/25/2017 1008   PLT 476 (H) 09/02/2016 1403   MCV 96.0 04/25/2017 1008   MCV 94.6 09/02/2016 1403   MCH 32.4 04/25/2017 1008   MCHC 33.7 04/25/2017 1008   RDW 12.8 04/25/2017 1008   RDW 13.6 09/02/2016 1403   LYMPHSABS 0.7 04/25/2017 1008   LYMPHSABS 2.5 09/02/2016 1403   MONOABS 0.6 04/25/2017 1008   MONOABS 0.7 09/02/2016 1403   EOSABS 0.3 04/25/2017 1008   EOSABS 0.7 (H) 09/02/2016 1403   BASOSABS 0.0 04/25/2017 1008   BASOSABS 0.1 09/02/2016 1403      Chemistry      Component Value Date/Time   NA 138 04/25/2017 1008   NA 140 09/02/2016 1403   K 4.1 04/25/2017 1008   K 4.2 09/02/2016 1403   CL 102 04/25/2017 1008   CO2 29 04/25/2017 1008   CO2 27 09/02/2016 1403   BUN 12 04/25/2017 1008   BUN 15.6 09/02/2016 1403    CREATININE 0.76 04/25/2017 1008   CREATININE 0.8 09/02/2016 1403      Component Value Date/Time   CALCIUM 9.0 04/25/2017 1008   CALCIUM 9.6 09/02/2016 1403   ALKPHOS 109 04/25/2017 1008   ALKPHOS 159 (H) 09/02/2016 1403   AST 14 (L) 04/25/2017 1008   AST 12 09/02/2016 1403   ALT 10 (L) 04/25/2017 1008   ALT <9 09/02/2016 1403   BILITOT 0.4 04/25/2017 1008   BILITOT <0.30 09/02/2016 1403     Lab Results  Component Value Date   TSH 3.266 04/11/2017    PENDING LABS:   RADIOGRAPHIC STUDIES:  No results found.   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  S/P concomitant chemoradiation with carboplatin/paclitaxel (09/20/2016- 10/25/2016) followed by 2 cycle of consolidative chemotherapy with carboplatin/paclitaxel (11/15/2016- 12/06/2016).  Now on consolidative durvalumab (Imfinzi) beginning on 01/26/2016 for up to 52 weeks.    Oncology history updated.  Pre-treatment labs: CBC diff, CMET, TSH.  I personally reviewed and went over laboratory results with the patient.  The results are noted within this dictation.  Lab satisfy treatment parameters.  Nursing, in accordance with chemotherapy administration protocol, will monitor for acute side effects/toxicities associated with chemotherapy administration today.  She will be due for repeat imaging in July 2018.  Return in 2 weeks for ongoing Imfinzi.  Return in 4 weeks for treatment and follow-up appointment.    Pneumonitis Imfinzi-induced pneumonitis, Grade 2, resulting in corticosteroid, prednisone, treatment and holding of immunotherapy from 04/25/2017- 05/09/2017.  Resolution/improvement to grade 1 noted.  Immunotherapy restarted on 05/09/2017.  She is currently finishing her taper of prednisone.  She is on 10 mg today.  Tomorrow, she goes down to 5 mg for 3 days.  Since her pneumonitis  is back to grade 1 and she is taking 10 mg of prednisone, we will restart Imfinzi.   Number of Diagnoses or Treatment Options- Section A:      Problems to Exam Physician Problem(s) Number x Points= Results  Self-limited or minor (stable, improved, or worsening)  Max='2  1 2  '$ Est. Problem (to examiner); stable, improved   1 1  Est. Problem (to examiner); worsening   2   New problem (to examiner); no additional work-up planned  Max=1 3   New problem (to examiner); add. work-up planned   4      Total: 3    Amount and/or Complexity of Data to be Reviewed- Section B    Data to be reviewed: Points    Review and/or order of clinical lab tests 1  '[x]'$    Review and/or order of tests in the radiology section of CPT (includes nuclear med & other except cardiac cath & ECG) 1  '[]'$    Review and/or order of tests in the medicine section of CPT (e.g. EKG, cardiac cath, non-invasive vascular studies, pulmonary function studies) 1  '[]'$    Discussion of test results with performing physician 1  '[]'$    Decision to obtain old records and/or obtaining history from someone other than patient 1  '[]'$    Review and summarization of old records and/or obtaining history from someone other than patient and/or discussion of case with another health care provider 2  '[x]'$    Independent visualization of image, tracing, or specimen itself (not simply review report) 2  '[]'$    Total:  3     Risk of  complications and/or Morbidity or Mortality- Section C  Level of Risk: Presenting Problem(s) Diagnostic Procedure(s) Ordered Management Options Selected  Minimal One self-limited or minor problem (eg cold, insect bite, tinea corporis)  Lab test requiring venipuncture  Chest xray  EKG/EEG  [place] or Echo  KOH prep  Urinalysis  Rest  Gargles  Elastic bandages  Superficial dressings  Low  Two or more self-limited or minor problems  One stable chronic illness (well-controlled HTN, non-insulin dependent diabetes, cataract,  BPH)  Acute uncomplicated illness or injury (cystitis, allergic rhinitis, simple sprain)  Physiologic test not under stress (pulm fnx tests)  Non-cardiovascular imaging studies with contrast (barium enema)  Superficial needle biopsy  Clinical laboratory tests requiring arterial puncture  Skin biopsies  OTC drugs  Minor surgery with no identified risk factors  Physical therapy  Occupational therapy  IV fluids without additives  Moderate  One or more chronic illnesses with mild exacerbation, progression, or side effects of treatment  Two or more stable chronic illnesses  Undiagnosed new problem with uncertain prognosis (lump in breast)  Acute illness with systemic symptoms (pyelonephritis, pneumonitis, colitis)  Acute complicated injury (head injury with brief loss of consciousness)  Physiologic test under stress (cardiac stress test, fetal contraction stress test)  Diagnostic endoscopies with no identified risk factors  Deep needle or incisional biopsy  Cardiovascular imaging studies with contrast and no identified risk factors (arteriogram, cardiac cath)  Obtain fluid from body cavity (LP, thoracentesis, culdocentesis)  Minor surgery with identified risk factors  Elective surgery (open, percutaneous, or endoscopic) with no identified risk factors  Prescription drug management  Therapeutic nuclear medicine  IV fluids with additives  Closed treatment of fracture of dislocation without manipulation  High  One or more chronic illnesses with severe exacerbation, progression, or side effects of treatment.  Acute or chronic illnesses or injuries that may pose a threat to life or bodily function (multiple trauma, acute MI, PE, severe respiratory distress, progressive severe rheumatoid arthritis, psychiatric illness with potential threat to self or others, peritonitis, acute renal failure)  An abrupt change in neurological status (seizure, TIA, weakness, sensory loss)   Cardiovascular imaging studies with contrast with identified risk factors  Cardiac electrophysiological tests  Diagnostic endoscopies with identified risk factors  Discography   Elective major surgery (open, percutaneous, or endoscopic) with identified risk factor  Emergency major surgery (open, percutaneous, or endoscopic)  Parental controlled substances  Drug therapy requiring intensive monitoring for toxicity  Decision not to resuscitate or deescalate care because of poor prognosis     Final Result of Complexity      Choose decision making level with 2 or 3 checks OR choose the decision making level on Section B       A Number of diagnoses or treatment options  '[]'$   </= 1 Minimal  '[]'$   2 Limited  '[x]'$   3 Multiple  '[]'$   >/= 4 Extensive  B Amount and complexity of data  '[]'$   </= 1 Minimal or low  '[]'$   2 Limited  '[x]'$   3  Moderate  '[]'$   >/= 4 Extensive  C Highest risk  '[]'$   Minimal  '[]'$   Low  '[]'$   Moderate  '[x]'$   High   Type of decision making  '[]'$   Straight-forward  '[]'$   Low Complexity  '[x]'$   Moderate- Complexity  '[]'$   High- Complexity     ORDERS PLACED FOR THIS ENCOUNTER: No orders of the defined types were placed in this encounter.   MEDICATIONS PRESCRIBED THIS ENCOUNTER: No orders of the defined types were placed in this encounter.   THERAPY PLAN:  Restart immunotherapy with resolution/improvement of presumed immunotherapy induced pneumonitis.  Back to grade 1.  All questions were answered. The patient knows to call the clinic with any problems, questions or concerns. We can certainly see the patient much sooner if necessary.  Patient and plan discussed with Dr. Twana First and she is in agreement with the aforementioned.   This note is electronically signed by: Doy Mince 05/09/2017 10:31 AM

## 2017-05-09 NOTE — Progress Notes (Signed)
Alteplase 2 ml instilled in port per protocol.

## 2017-05-09 NOTE — Patient Instructions (Addendum)
[place] at [facility] Discharge Instructions  RECOMMENDATIONS MADE BY THE CONSULTANT AND ANY TEST RESULTS WILL BE SENT TO YOUR REFERRING PHYSICIAN.  You were seen today by Kirby Crigler PA-C. Labs and treatment today. Complete Prednisone as prescribed. Treatment in 2 weeks. Return in 4 weeks for treatment and follow up.   Thank you for choosing [place] at [facility] to provide your oncology and hematology care.  To afford each patient quality time with our provider, please arrive at least 15 minutes before your scheduled appointment time.    If you have a lab appointment with the [facility] please come in thru the  Main Entrance and check in at the main information desk  You need to re-schedule your appointment should you arrive 10 or more minutes late.  We strive to give you quality time with our providers, and arriving late affects you and other patients whose appointments are after yours.  Also, if you no show three or more times for appointments you may be dismissed from the clinic at the providers discretion.     Again, thank you for choosing [facility].  Our hope is that these requests will decrease the amount of time that you wait before being seen by our physicians.       _____________________________________________________________  Should you have questions after your visit to [facility], please contact our office at [phone] between the hours of 8:30 a.m. and 4:30 p.m.  Voicemails left after 4:30 p.m. will not be returned until the following business day.  For prescription refill requests, have your pharmacy contact our office.       Resources For Cancer Patients and their Caregivers ? American Cancer Society: Can assist with transportation, wigs, general needs, runs Look Good Feel Better.        701-711-7234 ? Cancer Care: Provides financial assistance, online support groups,  medication/co-pay assistance.  1-800-813-HOPE (705)566-7802) ? [facility] Assists [place] cancer patients and their families through emotional , educational and financial support.  (475)650-4204 ? [place] DSS Where to apply for food stamps, Medicaid and utility assistance. 817-605-7569 ? [facility]: Transportation to medical appointments. 548 617 1707 ? Social Security Administration: May apply for disability if have a Stage IV cancer. (303) 544-3886 628-094-6184 ? [employer], Disability and Transit Services: Assists with nutrition, care and transit needs. [place] Support Programs: '@10RELATIVEDAYS'$ @ > Cancer Support Group  2nd Tuesday of the month 1pm-2pm, Journey Room  > Creative Journey  3rd Tuesday of the month 1130am-1pm, Journey Room  > Look Good Feel Better  1st Wednesday of the month 10am-12 noon, Journey Room (Call [place] to register [phone])

## 2017-05-09 NOTE — Assessment & Plan Note (Addendum)
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  S/P concomitant chemoradiation with carboplatin/paclitaxel (09/20/2016- 10/25/2016) followed by 2 cycle of consolidative chemotherapy with carboplatin/paclitaxel (11/15/2016- 12/06/2016).  Now on consolidative durvalumab (Imfinzi) beginning on 01/26/2016 for up to 52 weeks.    Oncology history updated.  Pre-treatment labs: CBC diff, CMET, TSH.  I personally reviewed and went over laboratory results with the patient.  The results are noted within this dictation.  Lab satisfy treatment parameters.  Nursing, in accordance with chemotherapy administration protocol, will monitor for acute side effects/toxicities associated with chemotherapy administration today.  She will be due for repeat imaging in July 2018.  Return in 2 weeks for ongoing Imfinzi.  Return in 4 weeks for treatment and follow-up appointment.

## 2017-05-09 NOTE — Patient Instructions (Signed)
[facility] Discharge Instructions for Patients Receiving Chemotherapy   Beginning January 23rd 2017 lab work for the Cancer Center will be done in the  Main lab at [facility] on 1st floor. If you have a lab appointment with the Cancer Center please come in thru the  Main Entrance and check in at the main information desk   Today you received the following chemotherapy agents   To help prevent nausea and vomiting after your treatment, we encourage you to take your nausea medication     If you develop nausea and vomiting, or diarrhea that is not controlled by your medication, call the clinic.  The clinic phone number is [phone]. Office hours are Monday-Friday 8:30am-5:00pm.  BELOW ARE SYMPTOMS THAT SHOULD BE REPORTED IMMEDIATELY:  *FEVER GREATER THAN 101.0 F  *CHILLS WITH OR WITHOUT FEVER  NAUSEA AND VOMITING THAT IS NOT CONTROLLED WITH YOUR NAUSEA MEDICATION  *UNUSUAL SHORTNESS OF BREATH  *UNUSUAL BRUISING OR BLEEDING  TENDERNESS IN MOUTH AND THROAT WITH OR WITHOUT PRESENCE OF ULCERS  *URINARY PROBLEMS  *BOWEL PROBLEMS  UNUSUAL RASH Items with * indicate a potential emergency and should be followed up as soon as possible. If you have an emergency after office hours please contact your primary care physician or go to the nearest emergency department.  Please call the clinic during office hours if you have any questions or concerns.   You may also contact the Patient Navigator at [phone] should you have any questions or need assistance in obtaining follow up care.      Resources For Cancer Patients and their Caregivers ? American Cancer Society: Can assist with transportation, wigs, general needs, runs Look Good Feel Better.        1-888-227-6333 ? Cancer Care: Provides financial assistance, online support groups, medication/co-pay assistance.  1-800-813-HOPE (4673) ? [facility] Assists [place] cancer  patients and their families through emotional , educational and financial support.  336-427-4357 ? [place] DSS Where to apply for food stamps, Medicaid and utility assistance. 336-342-1394 ? [facility]: Transportation to medical appointments. 336-347-2287 ? Social Security Administration: May apply for disability if have a Stage IV cancer. 336-342-7796 1-800-772-1213 ? [place] [facility], Disability and Transit Services: Assists with nutrition, care and transit needs. 336-349-2343         

## 2017-05-09 NOTE — Progress Notes (Signed)
Unable to get blood from port today. Will draw it peripherally.   Altaplase administered today per protocol. Was able to withdraw it and waste 10 ml. Blood return noted.   Chemotherapy given today per orders. Vitals stable and discharged home from clinic ambulatory. Follow up as scheduled.

## 2017-05-16 ENCOUNTER — Telehealth (HOSPITAL_COMMUNITY): Payer: Self-pay | Admitting: *Deleted

## 2017-05-16 NOTE — Telephone Encounter (Signed)
Patient states her cough is worse and is using Cepacol cough drops around the clock with minimal relief noted. She does not cough much mucus up and if she does she states it is clear.

## 2017-05-25 ENCOUNTER — Ambulatory Visit (HOSPITAL_COMMUNITY)
Admission: RE | Admit: 2017-05-25 | Discharge: 2017-05-25 | Disposition: A | Discharge status: Home / Self Care | Payer: Medicaid Other | Source: Ambulatory Visit | Attending: Adult Health | Admitting: Adult Health

## 2017-05-25 ENCOUNTER — Other Ambulatory Visit (HOSPITAL_COMMUNITY): Payer: Self-pay | Admitting: Adult Health

## 2017-05-25 ENCOUNTER — Encounter (HOSPITAL_COMMUNITY): Payer: Self-pay

## 2017-05-25 ENCOUNTER — Encounter (HOSPITAL_COMMUNITY): Payer: Medicaid Other

## 2017-05-25 DIAGNOSIS — C3411 Malignant neoplasm of upper lobe, right bronchus or lung: Secondary | ICD-10-CM

## 2017-05-25 DIAGNOSIS — R0602 Shortness of breath: Secondary | ICD-10-CM

## 2017-05-25 DIAGNOSIS — R05 Cough: Secondary | ICD-10-CM

## 2017-05-25 DIAGNOSIS — R5383 Other fatigue: Secondary | ICD-10-CM | POA: Diagnosis not present

## 2017-05-25 DIAGNOSIS — J189 Pneumonia, unspecified organism: Secondary | ICD-10-CM

## 2017-05-25 DIAGNOSIS — R059 Cough, unspecified: Secondary | ICD-10-CM

## 2017-05-25 MED ORDER — PREDNISONE 10 MG PO TABS: ORAL_TABLET | ORAL | 0 refills | Status: DC

## 2017-05-25 NOTE — Progress Notes (Signed)
Spoke with Forest Gleason, RN regarding patient's concerns for worsening fatigue, cough, and shortness of breath.   Will hold Imfinzi today and get STAT CXR; orders placed.  Will also obtain CT chest as soon as possible this week to evaluate for immunotherapy-induced pneumonitis.  Will also start patient on steroid taper for presumed pneumonitis.    Prednisone 1 mg/kg taper prescribed:              60 mg 7 days             40 mg 3 days             20 mg 3 days             10 mg 3 days             5 mg 3 days             STOP  Return to cancer center in 2 weeks for follow-up visit and possible treatment, based on findings of imaging and symptom response to steroids.   Discussed with Dr. Talbert Cage and she agrees with this plan.   Mike Craze, NP [place] [phone]

## 2017-05-25 NOTE — Patient Instructions (Signed)
[place] at [facility] Discharge Instructions  RECOMMENDATIONS MADE BY THE CONSULTANT AND ANY TEST RESULTS WILL BE SENT TO YOUR REFERRING PHYSICIAN.  Held treatment for two weeks Chest xray done Follow up as scheduled  Thank you for choosing [place] at [facility] to provide your oncology and hematology care.  To afford each patient quality time with our provider, please arrive at least 15 minutes before your scheduled appointment time.    If you have a lab appointment with the [facility] please come in thru the  Main Entrance and check in at the main information desk  You need to re-schedule your appointment should you arrive 10 or more minutes late.  We strive to give you quality time with our providers, and arriving late affects you and other patients whose appointments are after yours.  Also, if you no show three or more times for appointments you may be dismissed from the clinic at the providers discretion.     Again, thank you for choosing [facility].  Our hope is that these requests will decrease the amount of time that you wait before being seen by our physicians.       _____________________________________________________________  Should you have questions after your visit to [facility], please contact our office at [phone] between the hours of 8:30 a.m. and 4:30 p.m.  Voicemails left after 4:30 p.m. will not be returned until the following business day.  For prescription refill requests, have your pharmacy contact our office.       Resources For Cancer Patients and their Caregivers ? American Cancer Society: Can assist with transportation, wigs, general needs, runs Look Good Feel Better.        (847) 460-9087 ? Cancer Care: Provides financial assistance, online support groups, medication/co-pay assistance.  1-800-813-HOPE 343-502-4960) ? [place] Assists [place]  cancer patients and their families through emotional , educational and financial support.  4378346080 ? [place] DSS Where to apply for food stamps, Medicaid and utility assistance. 405-776-1618 ? [employer]: Transportation to medical appointments. 320-563-5510 ? Social Security Administration: May apply for disability if have a Stage IV cancer. (360)458-5590 (319) 781-3672 ? [employer], Disability and Transit Services: Assists with nutrition, care and transit needs. [place] Support Programs: @10RELATIVEDAYS @ > Cancer Support Group  2nd Tuesday of the month 1pm-2pm, Journey Room  > Creative Journey  3rd Tuesday of the month 1130am-1pm, Journey Room  > Look Good Feel Better  1st Wednesday of the month 10am-12 noon, Journey Room (Call [place] to register [phone])

## 2017-05-25 NOTE — Progress Notes (Signed)
Patient presented today with complaints of SOB, aching all over," even her hair hurts", low fever per husband at home , coughing has returned and is gotten worse, very weak. Vitals stable. Mike Craze NP notified. Will do a stat chest xray at this time.  Patient also stated that she is not urinating like she was. Encouraged her to call us back tomorrow if she continues to happen. MD notified.   Chest xray done. Steroid prescription given and instructed patient on how to take it. New schedule given , follow up as scheduled.

## 2017-05-25 NOTE — Progress Notes (Signed)
Encouraged patient to go to the ER if she starts to feel any worse.

## 2017-05-27 ENCOUNTER — Encounter (HOSPITAL_COMMUNITY): Payer: Self-pay

## 2017-05-27 ENCOUNTER — Ambulatory Visit (HOSPITAL_COMMUNITY)
Admission: RE | Admit: 2017-05-27 | Discharge: 2017-05-27 | Disposition: A | Discharge status: Home / Self Care | Payer: Medicaid Other | Source: Ambulatory Visit | Attending: Adult Health | Admitting: Adult Health

## 2017-05-27 ENCOUNTER — Encounter (HOSPITAL_COMMUNITY): Payer: Self-pay | Admitting: Adult Health

## 2017-05-27 ENCOUNTER — Other Ambulatory Visit (HOSPITAL_COMMUNITY): Payer: Self-pay | Admitting: Adult Health

## 2017-05-27 ENCOUNTER — Other Ambulatory Visit (HOSPITAL_COMMUNITY): Payer: Self-pay | Admitting: *Deleted

## 2017-05-27 ENCOUNTER — Ambulatory Visit (HOSPITAL_COMMUNITY): Admission: RE | Admit: 2017-05-27 | Payer: Medicaid Other | Source: Ambulatory Visit

## 2017-05-27 DIAGNOSIS — Z923 Personal history of irradiation: Secondary | ICD-10-CM | POA: Insufficient documentation

## 2017-05-27 DIAGNOSIS — C3411 Malignant neoplasm of upper lobe, right bronchus or lung: Secondary | ICD-10-CM

## 2017-05-27 DIAGNOSIS — I2510 Atherosclerotic heart disease of native coronary artery without angina pectoris: Secondary | ICD-10-CM | POA: Diagnosis not present

## 2017-05-27 DIAGNOSIS — R918 Other nonspecific abnormal finding of lung field: Secondary | ICD-10-CM | POA: Diagnosis not present

## 2017-05-27 DIAGNOSIS — R5383 Other fatigue: Secondary | ICD-10-CM

## 2017-05-27 DIAGNOSIS — I700 Atherosclerosis of aorta: Secondary | ICD-10-CM | POA: Insufficient documentation

## 2017-05-27 DIAGNOSIS — R0602 Shortness of breath: Secondary | ICD-10-CM

## 2017-05-27 MED ORDER — IOPAMIDOL (ISOVUE-300) INJECTION 61%
75.0000 mL | Freq: Once | INTRAVENOUS | Status: AC | PRN
  Administered 2017-05-27: 75 mL via INTRAVENOUS

## 2017-05-27 MED ORDER — TRAMADOL HCL 50 MG PO TABS: 50.0000 mg | ORAL_TABLET | Freq: Four times a day (QID) | ORAL | 0 refills | Status: DC | PRN

## 2017-05-27 NOTE — Progress Notes (Signed)
Patient requesting refill of Tramadol.   [place] Controlled Substance Reporting System reviewed and refill is appropriate. Paper prescription printed and given to [facility], [place].   Mike Craze, NP [place] [phone]

## 2017-06-06 ENCOUNTER — Telehealth (HOSPITAL_COMMUNITY): Payer: Self-pay | Admitting: Emergency Medicine

## 2017-06-06 ENCOUNTER — Ambulatory Visit (HOSPITAL_COMMUNITY): Payer: Medicaid Other

## 2017-06-06 ENCOUNTER — Encounter (HOSPITAL_COMMUNITY): Payer: Medicaid Other | Attending: Hematology & Oncology | Admitting: Oncology

## 2017-06-06 ENCOUNTER — Encounter (HOSPITAL_COMMUNITY): Payer: Self-pay | Admitting: Oncology

## 2017-06-06 VITALS — BP 146/80 | HR 107 | Resp 20 | Ht 60.0 in | Wt 122.0 lb

## 2017-06-06 DIAGNOSIS — C3411 Malignant neoplasm of upper lobe, right bronchus or lung: Secondary | ICD-10-CM

## 2017-06-06 DIAGNOSIS — Z9221 Personal history of antineoplastic chemotherapy: Secondary | ICD-10-CM | POA: Insufficient documentation

## 2017-06-06 DIAGNOSIS — J189 Pneumonia, unspecified organism: Secondary | ICD-10-CM

## 2017-06-06 MED ORDER — BENZOCAINE-GLYCERIN-DM 5-30-5 %-MG/SPRAY PO LIQD: 2.0000 | Freq: Four times a day (QID) | ORAL | 3 refills | Status: DC | PRN

## 2017-06-06 MED ORDER — PREDNISONE 2.5 MG PO TABS: ORAL_TABLET | ORAL | 0 refills | Status: DC

## 2017-06-06 NOTE — Progress Notes (Signed)
Emily Chroman, MD [place] [id number]  No diagnosis found.  CURRENT THERAPY: Consolidative therapy beginning on 01/25/2017 consisting of durvalumab (Imfinzi).  On HOLD since 04/25/2017 due to concern for pneumonitis.  INTERVAL HISTORY: Emily Pope 61 y.o. 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  S/P concomitant chemoradiation with carboplatin/paclitaxel (09/20/2016- 10/25/2016) followed by 2 cycle of consolidative chemotherapy with carboplatin/paclitaxel (11/15/2016- 12/06/2016).  Now on consolidative durvalumab (Imfinzi) beginning on 01/26/2016 for up to 52 weeks.   AND Imfinzi-induced pneumonitis, grade 2, resulting in holding Imfinzi from 04/25/2017- 05/09/2017 with corticosteroids treatment with taper.  This resolved/improved with steroids.   After patient received immunotherapy on 14th of May started having increasing cough and shortness of breath.  Patient received a short course of prednisone recently without much relief.  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  S/P concomitant chemoradiation with carboplatin/paclitaxel (09/20/2016- 10/25/2016) followed by 2 cycle of consolidative chemotherapy with carboplatin/paclitaxel (11/15/2016- 12/06/2016).  Now on consolidative durvalumab (Imfinzi) beginning on 01/26/2016 for up to 52 weeks.   AND Imfinzi-induced pneumonitis, grade 2, resulting in holding Imfinzi from 04/25/2017- 05/09/2017 with corticosteroids treatment with taper.  This resolved/improved with steroids.      Primary cancer of right upper lobe of lung ([place])   08/04/2016 PET scan    PET IMPRESSION: 1. 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 Mass is confluent with the right superior hilum and right lower tracheal wall, suggestive of a T4 primary tumor. 2. Postobstructive pneumonia in the right upper lobe. 3. Hypermetabolic ipsilateral and contralateral mediastinal and contralateral hilar lymphadenopathy, suggestive of N3 nodal disease.      08/24/2016 Procedure    Bronchoscopy      08/25/2016 Pathology Results    Diagnosis Endobronchial biopsy, RUL - POORLY DIFFERENTIATED NON-SMALL CELL CARCINOMA. The immunophenotype is consistent with poorly differentiated adenocarcinoma.      09/10/2016 Imaging    MRI brain- No metastatic disease or acute intracranial abnormality.      09/20/2016 - 10/25/2016 Chemotherapy    Carboplatin/Paclitaxel weekly x 6 with XRT.      09/20/2016 - 10/25/2016 Radiation Therapy         11/15/2016 - 12/06/2016 Chemotherapy    Carboplatin/paclitaxel every 21 days x 2 cycles.      12/30/2016 Imaging    CT CAP- 1. Central right upper lobe lung mass has mildly decreased since 11/21/2016 chest CT angiogram and significantly decreased since 08/04/2016 PET-CT. 2. No pathologically enlarged thoracic lymph nodes. Previously described hypermetabolic thoracic adenopathy on the 08/04/2016 PET-CT has decreased in size. 3. Previously described bilateral lower lobe pulmonary nodules are stable in size. 4. New solitary subsolid 4 mm right middle lobe pulmonary nodule is nonspecific, and may be inflammatory. Recommend attention on a follow-up chest CT in 3 months. 5. No definite metastatic disease in the abdomen. Tiny sub 5 mm low-attenuation lesions in the liver are too small to characterize, favor benign. 6. Aortic atherosclerosis. One vessel coronary atherosclerosis. 7. Moderate emphysema.      01/25/2017 -  Chemotherapy    Durvalumab (Imfinzi) x 52 weeks.       03/31/2017 Imaging  CT  chest with contrast: Stable size of partially calcified mass in the posterior right upper lobe.  Increased airspace disease in paramediastinal right upper and superior right lower lobes. Differential diagnosis includes radiation pneumonitis, drug reaction, and infection.  Stable sub-cm right lower lobe pulmonary nodule.  No evidence of lymphadenopathy or pleural effusion.  Emphysema.  Aortic and coronary artery atherosclerosis.      04/25/2017 Adverse Reaction    Progressive cough, concerning for pneumonitis.      04/25/2017 Treatment Plan Change    Progressive cough concerning for pneumonitis.  Treatment held.  Managed by steroids.      05/09/2017 Treatment Plan Change    Restart Imfinzi with improvement of cough, Grade 1.        HPI Elements NSCLC  Location: RUL  Quality: Adenocarcinoma  Severity: Stage IIIB (T4N3M0)  Duration: Dx in 08/2016  Context: Tobacco abuse  Timing:   Modifying Factors: Progressive cough on immunotherapy, trial of steroids with holding of therapy from 04/25/2016- 05/09/2017  Associated Signs & Symptoms: Cough, SOB   She notes significant improvement in her cough.  She notes a persistent dry cough, but she blames this on seasonal allergies.  She is now back to a GRADE 1 pneumonitis and at the 10 mg dose of prednisone.  She notes an "itchy" cough and it "tickles."  Her appetite and energy level is stable.  She notes an improvement in her appetite with steroids.  Weight is stable.  She reports a left chest wall vein that is visibly noticeable and new.   Review of Systems  Constitutional: Negative.  Negative for chills, fever and weight loss.  HENT: Negative.   Eyes: Negative.   Respiratory: Positive for cough (wheezing shortness of breath and cough) and shortness of breath (increasing shortness of breath on exertion).   Cardiovascular: Negative.  Negative for chest pain.  Gastrointestinal: Negative.  Negative for blood in stool,  constipation, diarrhea, melena, nausea and vomiting.  Genitourinary: Negative.   Musculoskeletal: Negative.   Skin: Negative.   Neurological: Negative.  Negative for weakness.  Endo/Heme/Allergies: Negative.   Psychiatric/Behavioral: Negative.     Past Medical History:  Diagnosis Date  . Anxiety    uses it for menopause   . Arthritis   . Cancer ([place])    Right Lung  . Chronic bronchitis ([place])   . COPD (chronic obstructive pulmonary disease) ([place])   . GERD (gastroesophageal reflux disease)   . Headache    sinus  . Hypertension   . Pneumonia    07/22/16-had pneumonia in right lung and found mass in lung  . Pneumonitis 05/09/2017  . Primary cancer of right upper lobe of lung ([place]) 08/23/2016    Past Surgical History:  Procedure Laterality Date  . ABDOMINAL HYSTERECTOMY    . APPENDECTOMY     when had hysterectomy  . CARPAL TUNNEL RELEASE Bilateral   . ENDOBRONCHIAL ULTRASOUND Bilateral 08/23/2016   Procedure: ENDOBRONCHIAL ULTRASOUND;  Surgeon: Collene Gobble, MD;  Location: [facility];  Service: Cardiopulmonary;  Laterality: Bilateral;  . HERNIA REPAIR     left inguinal hernia age 79  . PORTACATH PLACEMENT Left 09/17/2016   Procedure: INSERTION PORT-A-CATH LEFT SUBCLAVIAN;  Surgeon: Aviva Signs, MD;  Location: [facility];  Service: General;  Laterality: Left;  . TUBAL LIGATION      Family History  Problem Relation Age of Onset  . Cancer Mother        lung  . Hypertension Mother   . Hypertension Father   .  Diabetes Father   . Hypertension Sister   . Cancer Brother        lung  . Hypertension Sister   . Colon cancer Neg Hx     Social History   Social History  . Marital status: Divorced    Spouse name: N/A  . Number of children: N/A  . Years of education: N/A   Social History Main Topics  . Smoking status: Former Smoker    Packs/day: 2.00    Years: 43.00    Quit date: 07/20/2016  . Smokeless tobacco: Never Used  . Alcohol use No  . Drug use: No  . Sexual  activity: Not on file   Other Topics Concern  . Not on file   Social History Narrative  . No narrative on file     PHYSICAL EXAMINATION  ECOG PERFORMANCE STATUS: 1 - Symptomatic but completely ambulatory  There were no vitals filed for this visit.  GENERAL:alert, no distress, well nourished, well developed, comfortable, cooperative, smiling and unaccompanied SKIN: skin color, texture, turgor are normal, no rashes or significant lesions, left chest vein that is visible without tenderness or erythema around her port HEAD: Normocephalic, No masses, lesions, tenderness or abnormalities EYES: normal, EOMI, Conjunctiva are pink and non-injected EARS: External ears normal OROPHARYNX:lips, buccal mucosa, and tongue normal and mucous membranes are moist  NECK: supple, trachea midline LYMPH:  no palpable lymphadenopathy BREAST:not examined LUNGS: clear to auscultation  HEART: regular rate & rhythm, no murmurs and no gallops ABDOMEN:abdomen soft, non-tender and normal bowel sounds BACK: Back symmetric, no curvature. EXTREMITIES:less then 2 second capillary refill, no joint deformities, effusion, or inflammation, no edema, no skin discoloration, no cyanosis  NEURO: alert & oriented x 3 with fluent speech, no focal motor/sensory deficits, gait normal   LABORATORY DATA: CBC    Component Value Date/Time   WBC 11.4 (H) 05/09/2017 1026   RBC 3.90 05/09/2017 1026   HGB 12.5 05/09/2017 1026   HGB 12.9 09/02/2016 1403   HCT 37.9 05/09/2017 1026   HCT 38.4 09/02/2016 1403   PLT 331 05/09/2017 1026   PLT 476 (H) 09/02/2016 1403   MCV 97.2 05/09/2017 1026   MCV 94.6 09/02/2016 1403   MCH 32.1 05/09/2017 1026   MCHC 33.0 05/09/2017 1026   RDW 14.6 05/09/2017 1026   RDW 13.6 09/02/2016 1403   LYMPHSABS 1.5 05/09/2017 1026   LYMPHSABS 2.5 09/02/2016 1403   MONOABS 0.8 05/09/2017 1026   MONOABS 0.7 09/02/2016 1403   EOSABS 0.2 05/09/2017 1026   EOSABS 0.7 (H) 09/02/2016 1403   BASOSABS  0.0 05/09/2017 1026   BASOSABS 0.1 09/02/2016 1403      Chemistry      Component Value Date/Time   NA 138 05/09/2017 1026   NA 140 09/02/2016 1403   K 4.2 05/09/2017 1026   K 4.2 09/02/2016 1403   CL 100 (L) 05/09/2017 1026   CO2 30 05/09/2017 1026   CO2 27 09/02/2016 1403   BUN 20 05/09/2017 1026   BUN 15.6 09/02/2016 1403   CREATININE 0.92 05/09/2017 1026   CREATININE 0.8 09/02/2016 1403      Component Value Date/Time   CALCIUM 8.9 05/09/2017 1026   CALCIUM 9.6 09/02/2016 1403   ALKPHOS 112 05/09/2017 1026   ALKPHOS 159 (H) 09/02/2016 1403   AST 15 05/09/2017 1026   AST 12 09/02/2016 1403   ALT 18 05/09/2017 1026   ALT <9 09/02/2016 1403   BILITOT 0.6 05/09/2017 1026   BILITOT <0.30  09/02/2016 1403     Lab Results  Component Value Date   TSH 4.011 05/09/2017    PENDING LABS:   RADIOGRAPHIC STUDIES:  Dg Chest 2 View  Result Date: 05/25/2017 CLINICAL DATA:  New diagnosis of right lung cancer. Wheezing and shortness of breath. EXAM: CHEST  2 VIEW COMPARISON:  03/28/2017 FINDINGS: Again noted is volume loss and consolidation in the right upper lobe associated with a known calcified mass. Findings in right upper lung are unchanged. Stable position of the left subclavian Port-A-Cath with the tip in the upper SVC region. Heart and mediastinum are stable. Atherosclerotic calcifications at the aortic arch. Left lung remains clear. No pleural effusions. No acute bone abnormality. IMPRESSION: Chronic changes in the right upper lung. No acute chest findings. Electronically Signed   By: Markus Daft M.D.   On: 05/25/2017 12:32   Ct Chest W Contrast  Result Date: 05/27/2017 CLINICAL DATA:  Right upper lobe lung cancer. Shortness of breath. Fatigue. On immunotherapy. Evaluate for pneumonitis. EXAM: CT CHEST WITH CONTRAST TECHNIQUE: Multidetector CT imaging of the chest was performed during intravenous contrast administration. CONTRAST:  70mL ISOVUE-300 IOPAMIDOL (ISOVUE-300) INJECTION  61% COMPARISON:  05/25/2017 plain film.  CT of 03/31/2017. FINDINGS: Cardiovascular: A left-sided Port-A-Cath terminates at the mid SVC. Aortic and branch vessel atherosclerosis. Normal heart size, without pericardial effusion. Multivessel coronary artery atherosclerosis. No central pulmonary embolism, on this non-dedicated study. Mediastinum/Nodes: No supraclavicular adenopathy. No mediastinal or hilar adenopathy. Lungs/Pleura: No pleural fluid.  Moderate centrilobular emphysema. Partially calcified posterior right apical lung mass measures 4.4 x 4.4 cm on image 31/series 2. This is similar 4.3 x 4.7 cm at the same level on the prior exam (when remeasured). Minimal increase in adjacent more anterior and lateral consolidation on image 30/ series 4. Other areas of septal thickening and ground-glass opacity in the anterior and medial right upper lobe, as well as the adjacent superior segment right lower lobe, are similar. Right lower lobe pleural-based nodule measures 7 mm on image 108/ series 4 and is unchanged. Suspect subtle patchy mosaic attenuation, secondary to minimal ground-glass opacity. Example in the right lower lobe on image 79/series 4 and within the left lower lobe, including on image 60/series 4. Minimal radiation change in the paramediastinal left upper lobe is similar. Upper Abdomen: Right liver lobe lesions are too small to characterize but favored to represent cysts. Normal imaged portions of the spleen, stomach, pancreas, kidneys. Mild adrenal thickening bilaterally. Musculoskeletal: No acute osseous abnormality. IMPRESSION: 1. Similar size of partially calcified posterior right apical lung mass. 2. Similar to minimal increase in surrounding radiation fibrosis. 3. Subtle areas of mosaic attenuation are greater on the right. Likely attributed to mild ground-glass opacity. In the appropriate clinical setting, this could represent drug toxicity induced pneumonitis. 4.  Coronary artery atherosclerosis.  Aortic atherosclerosis. 5. Similar right lower lobe pulmonary nodule. Electronically Signed   By: Abigail Miyamoto M.D.   On: 05/27/2017 13:05     PATHOLOGY:    ASSESSMENT AND PLAN:  No problem-specific Assessment & Plan notes found for this encounter.   Number of Diagnoses or Treatment Options- Section A:      Problems to Exam Physician Problem(s) Number x Points= Results  Self-limited or minor (stable, improved, or worsening)  Max=2  1 2   Est. Problem (to examiner); stable, improved   1 1  Est. Problem (to examiner); worsening   2   New problem (to examiner); no additional work-up planned  Max=1 3   New  problem (to examiner); add. work-up planned   4      Total: 3    Amount and/or Complexity of Data to be Reviewed- Section B    Data to be reviewed: Points    Review and/or order of clinical lab tests 1  [x]    Review and/or order of tests in the radiology section of CPT (includes nuclear med & other except cardiac cath & ECG) 1  []    Review and/or order of tests in the medicine section of CPT (e.g. EKG, cardiac cath, non-invasive vascular studies, pulmonary function studies) 1  []    Discussion of test results with performing physician 1  []    Decision to obtain old records and/or obtaining history from someone other than patient 1  []    Review and summarization of old records and/or obtaining history from someone other than patient and/or discussion of case with another health care provider 2  [x]    Independent visualization of image, tracing, or specimen itself (not simply review report) 2  []    Total:  3     Risk of  complications and/or Morbidity or Mortality- Section C  Level of Risk: Presenting Problem(s) Diagnostic Procedure(s) Ordered Management Options Selected  Minimal One self-limited or minor problem (eg cold, insect bite, tinea corporis)  Lab test requiring venipuncture  Chest xray  EKG/EEG  [place] or Echo  KOH prep  Urinalysis  Rest  Gargles  Elastic  bandages  Superficial dressings  Low  Two or more self-limited or minor problems  One stable chronic illness (well-controlled HTN, non-insulin dependent diabetes, cataract, BPH)  Acute uncomplicated illness or injury (cystitis, allergic rhinitis, simple sprain)  Physiologic test not under stress (pulm fnx tests)  Non-cardiovascular imaging studies with contrast (barium enema)  Superficial needle biopsy  Clinical laboratory tests requiring arterial puncture  Skin biopsies  OTC drugs  Minor surgery with no identified risk factors  Physical therapy  Occupational therapy  IV fluids without additives  Moderate  One or more chronic illnesses with mild exacerbation, progression, or side effects of treatment  Two or more stable chronic illnesses  Undiagnosed new problem with uncertain prognosis (lump in breast)  Acute illness with systemic symptoms (pyelonephritis, pneumonitis, colitis)  Acute complicated injury (head injury with brief loss of consciousness)  Physiologic test under stress (cardiac stress test, fetal contraction stress test)  Diagnostic endoscopies with no identified risk factors  Deep needle or incisional biopsy  Cardiovascular imaging studies with contrast and no identified risk factors (arteriogram, cardiac cath)  Obtain fluid from body cavity (LP, thoracentesis, culdocentesis)  Minor surgery with identified risk factors  Elective surgery (open, percutaneous, or endoscopic) with no identified risk factors  Prescription drug management  Therapeutic nuclear medicine  IV fluids with additives  Closed treatment of fracture of dislocation without manipulation  High  One or more chronic illnesses with severe exacerbation, progression, or side effects of treatment.  Acute or chronic illnesses or injuries that may pose a threat to life or bodily function (multiple trauma, acute MI, PE, severe respiratory distress, progressive severe rheumatoid arthritis,  psychiatric illness with potential threat to self or others, peritonitis, acute renal failure)  An abrupt change in neurological status (seizure, TIA, weakness, sensory loss)  Cardiovascular imaging studies with contrast with identified risk factors  Cardiac electrophysiological tests  Diagnostic endoscopies with identified risk factors  Discography   Elective major surgery (open, percutaneous, or endoscopic) with identified risk factor  Emergency major surgery (open, percutaneous, or endoscopic)  Parental controlled  substances  Drug therapy requiring intensive monitoring for toxicity  Decision not to resuscitate or deescalate care because of poor prognosis     Final Result of Complexity      Choose decision making level with 2 or 3 checks OR choose the decision making level on Section B       A Number of diagnoses or treatment options  []   </= 1 Minimal  []   2 Limited  [x]   3 Multiple  []   >/= 4 Extensive  B Amount and complexity of data  []   </= 1 Minimal or low  []   2 Limited  [x]   3  Moderate  []   >/= 4 Extensive  C Highest risk  []   Minimal  []   Low  []   Moderate  [x]   High   Type of decision making  []   Straight-forward  []   Low Complexity  [x]   Moderate- Complexity  []   High- Complexity     ORDERS PLACED FOR THIS ENCOUNTER: No orders of the defined types were placed in this encounter.   MEDICATIONS PRESCRIBED THIS ENCOUNTER: No orders of the defined types were placed in this encounter.   THERAPY PLAN:  After re-challenging with immunotherapy patient has developed  significant symptoms of shortness of breath and increasing cough Most likely a symptom complex suggested interstitial pneumonitis  We would hold off any further immunotherapy and patient may not be a candidate for this treatment Discontinue immunotherapy Start patient on benzocaine spray as patient reports that numbing the throat once in a while helps to reduce  coughing Prednisone 50 mg taper by 2.5 mg as been started The patient does not have significant relief then a repeat CT scan can be ordered and pulmonary consultation can be considered for prolonged steroid course Discussed with the patient and family at length    This note is electronically signed by: Forest Gleason, MD 06/06/2017 10:11 AM

## 2017-06-06 NOTE — Patient Instructions (Signed)
[facility] at [facility] Discharge Instructions  RECOMMENDATIONS MADE BY THE CONSULTANT AND ANY TEST RESULTS WILL BE SENT TO YOUR REFERRING PHYSICIAN.  You were seen today by Dr. Oliva Bustard. Stop taking current dose of prednisone and start taking new dose of 50 mg. Pick up your Benzocaine spray from your pharmacy. Return in 2 weeks for follow up.   Thank you for choosing [facility] at [facility] to provide your oncology and hematology care.  To afford each patient quality time with our provider, please arrive at least 15 minutes before your scheduled appointment time.    If you have a lab appointment with the [facility] please come in thru the  Main Entrance and check in at the main information desk  You need to re-schedule your appointment should you arrive 10 or more minutes late.  We strive to give you quality time with our providers, and arriving late affects you and other patients whose appointments are after yours.  Also, if you no show three or more times for appointments you may be dismissed from the clinic at the providers discretion.     Again, thank you for choosing [facility].  Our hope is that these requests will decrease the amount of time that you wait before being seen by our physicians.       _____________________________________________________________  Should you have questions after your visit to [facility], please contact our office at [phone] between the hours of 8:30 a.m. and 4:30 p.m.  Voicemails left after 4:30 p.m. will not be returned until the following business day.  For prescription refill requests, have your pharmacy contact our office.       Resources For Cancer Patients and their Caregivers ? American Cancer Society: Can assist with transportation, wigs, general needs, runs Look Good Feel Better.        (269)677-8926 ? Cancer Care: Provides financial assistance, online  support groups, medication/co-pay assistance.  1-800-813-HOPE 678-210-3738) ? [facility] Assists [place] cancer patients and their families through emotional , educational and financial support.  (731)645-1407 ? [place] DSS Where to apply for food stamps, Medicaid and utility assistance. 847-364-3406 ? [facility]: Transportation to medical appointments. 561 203 6640 ? Social Security Administration: May apply for disability if have a Stage IV cancer. (573)363-1922 208 834 0147 ? [employer], Disability and Transit Services: Assists with nutrition, care and transit needs. [place] Support Programs: @10RELATIVEDAYS @ > Cancer Support Group  2nd Tuesday of the month 1pm-2pm, Journey Room  > Creative Journey  3rd Tuesday of the month 1130am-1pm, Journey Room  > Look Good Feel Better  1st Wednesday of the month 10am-12 noon, Journey Room (Call [facility] to register [phone])

## 2017-06-06 NOTE — Telephone Encounter (Signed)
Pt called and stated that [facility] did not have the benzocaine throat spray that Dr Jeb Levering had prescribed this morning.  They told her it was they same thing as cepachol throat spray and she said that did not work.  Spoke with the doctor and he said to check to see if they had some lidocaine spray.  They did not.  She already has lidocaine swish.  He said we could call pharmacies to check to see if they had the original prescription.  I offered this to the pt and she said she would just wait.  She said she would just keep using the cephacol drops and lidocaine swish.  She would call me if she wanted me to try to find a pharmacy for her.

## 2017-06-13 ENCOUNTER — Emergency Department (HOSPITAL_COMMUNITY): Payer: Medicaid Other

## 2017-06-13 ENCOUNTER — Other Ambulatory Visit: Payer: Self-pay

## 2017-06-13 ENCOUNTER — Emergency Department (HOSPITAL_COMMUNITY)
Admission: EM | Admit: 2017-06-13 | Discharge: 2017-06-13 | Disposition: A | Discharge status: Home / Self Care | Payer: Medicaid Other | Attending: Emergency Medicine | Admitting: Emergency Medicine

## 2017-06-13 ENCOUNTER — Encounter (HOSPITAL_COMMUNITY): Payer: Self-pay | Admitting: Emergency Medicine

## 2017-06-13 ENCOUNTER — Other Ambulatory Visit (HOSPITAL_COMMUNITY): Payer: Self-pay | Admitting: Emergency Medicine

## 2017-06-13 DIAGNOSIS — R0602 Shortness of breath: Secondary | ICD-10-CM | POA: Diagnosis present

## 2017-06-13 DIAGNOSIS — I10 Essential (primary) hypertension: Secondary | ICD-10-CM | POA: Insufficient documentation

## 2017-06-13 DIAGNOSIS — Z885 Allergy status to narcotic agent status: Secondary | ICD-10-CM | POA: Diagnosis not present

## 2017-06-13 DIAGNOSIS — Z87891 Personal history of nicotine dependence: Secondary | ICD-10-CM | POA: Diagnosis not present

## 2017-06-13 DIAGNOSIS — R0600 Dyspnea, unspecified: Secondary | ICD-10-CM | POA: Insufficient documentation

## 2017-06-13 DIAGNOSIS — C3411 Malignant neoplasm of upper lobe, right bronchus or lung: Secondary | ICD-10-CM | POA: Diagnosis not present

## 2017-06-13 DIAGNOSIS — Z79899 Other long term (current) drug therapy: Secondary | ICD-10-CM | POA: Insufficient documentation

## 2017-06-13 DIAGNOSIS — Z880 Allergy status to penicillin: Secondary | ICD-10-CM | POA: Diagnosis not present

## 2017-06-13 DIAGNOSIS — J449 Chronic obstructive pulmonary disease, unspecified: Secondary | ICD-10-CM | POA: Diagnosis not present

## 2017-06-13 LAB — CBC WITH DIFFERENTIAL/PLATELET
BASOS ABS: 0 10*3/uL (ref 0.0–0.1)
Basophils Relative: 0 %
EOS ABS: 0.1 10*3/uL (ref 0.0–0.7)
Eosinophils Relative: 1 %
HCT: 38.3 % (ref 36.0–46.0)
Hemoglobin: 12.3 g/dL (ref 12.0–15.0)
LYMPHS PCT: 8 %
Lymphs Abs: 0.7 10*3/uL (ref 0.7–4.0)
MCH: 30.8 pg (ref 26.0–34.0)
MCHC: 32.1 g/dL (ref 30.0–36.0)
MCV: 95.8 fL (ref 78.0–100.0)
MONO ABS: 0.7 10*3/uL (ref 0.1–1.0)
Monocytes Relative: 8 %
Neutro Abs: 7.3 10*3/uL (ref 1.7–7.7)
Neutrophils Relative %: 83 %
PLATELETS: 409 10*3/uL — AB (ref 150–400)
RBC: 4 MIL/uL (ref 3.87–5.11)
RDW: 15 % (ref 11.5–15.5)
WBC: 8.9 10*3/uL (ref 4.0–10.5)

## 2017-06-13 LAB — COMPREHENSIVE METABOLIC PANEL
ALT: 27 U/L (ref 14–54)
AST: 18 U/L (ref 15–41)
Albumin: 3.5 g/dL (ref 3.5–5.0)
Alkaline Phosphatase: 126 U/L (ref 38–126)
Anion gap: 11 (ref 5–15)
BUN: 12 mg/dL (ref 6–20)
CHLORIDE: 98 mmol/L — AB (ref 101–111)
CO2: 28 mmol/L (ref 22–32)
Calcium: 9.2 mg/dL (ref 8.9–10.3)
Creatinine, Ser: 0.93 mg/dL (ref 0.44–1.00)
GFR calc Af Amer: >60 mL/min (ref >60)
Glucose, Bld: 94 mg/dL (ref 65–99)
POTASSIUM: 3.8 mmol/L (ref 3.5–5.1)
SODIUM: 137 mmol/L (ref 135–145)
Total Bilirubin: 0.6 mg/dL (ref 0.3–1.2)
Total Protein: 7.6 g/dL (ref 6.5–8.1)

## 2017-06-13 LAB — TROPONIN I

## 2017-06-13 MED ORDER — METHYLPREDNISOLONE SODIUM SUCC 125 MG IJ SOLR
60.0000 mg | Freq: Once | INTRAMUSCULAR | Status: AC
  Administered 2017-06-13: 60 mg via INTRAVENOUS
  Filled 2017-06-13: qty 2

## 2017-06-13 MED ORDER — IPRATROPIUM-ALBUTEROL 0.5-2.5 (3) MG/3ML IN SOLN
3.0000 mL | Freq: Once | RESPIRATORY_TRACT | Status: AC
  Administered 2017-06-13: 3 mL via RESPIRATORY_TRACT
  Filled 2017-06-13: qty 3

## 2017-06-13 MED ORDER — PREDNISONE 10 MG PO TABS: ORAL_TABLET | ORAL | 0 refills | Status: DC

## 2017-06-13 NOTE — Discharge Instructions (Signed)
Tests showed no life-threatening condition. Take your medicine as prescribed. Follow-up your regular doctors.

## 2017-06-13 NOTE — ED Triage Notes (Signed)
PT states she has right lung cancer and had a treatment about a month ago. PT states has had cough x3 months. PT states worsening in SOB with exertion and weakness x1 week.

## 2017-06-13 NOTE — ED Notes (Signed)
Patient ambulated to restroom with no difficulty noted.  

## 2017-06-13 NOTE — ED Notes (Signed)
ED Provider at bedside. 

## 2017-06-13 NOTE — ED Notes (Signed)
RT at bedside.

## 2017-06-13 NOTE — Progress Notes (Signed)
Pt called this morning, she was very short of breathe.  Pt states that the prednisone is not helping.  She will cough and cant stop coughing.  Left side really hurts.  Pt states that she ran out of prednisone yesterday.  This could be causing her SOB.  She has had pneumonitis and was suppose to be on a prednisone taper.  I spoke with Dr Talbert Cage, we are calling in a new prescription of prednisone but in the meantime pt is to go to the ER for evaluation of her SOB.   Met husband in the clinic.  Explained the new prednisone taper and walked with him back down to the ER.  Spoke with Nyella and let her know what was going on.  Pt still coughing but did not seem as winded as she did on the phone this morning.

## 2017-06-13 NOTE — ED Notes (Signed)
Patients husband to desk requesting writer to take iv out of patient stating they are wanting to leave due to wait time.  Advised visitor I would speak with EDP and come in to speak to patient. Visitor verbalized understanding.

## 2017-06-13 NOTE — ED Provider Notes (Signed)
[facility] DEPT Provider Note   CSN: [id number] Arrival date & time: 06/13/17  0930     History   Chief Complaint Chief Complaint  Patient presents with  . Shortness of Breath    HPI Emily Pope is a 61 y.o. female.  Patient complains of dyspnea for 7 days. She has recently been treated for pneumonitis with prednisone therapy for diagnosis of right upper lobe lung cancer. Her prednisone has recently been discontinued. No fever, sweats, chills, productive cough, chest pain. She occasionally takes a breathing treatment which helps. Severity of symptoms is mild.      Past Medical History:  Diagnosis Date  . Anxiety    uses it for menopause   . Arthritis   . Cancer (Emily Pope)    Right Lung  . Chronic bronchitis (Emily Pope)   . COPD (chronic obstructive pulmonary disease) ([name])   . GERD (gastroesophageal reflux disease)   . Headache    sinus  . Hypertension   . Pneumonia    07/22/16-had pneumonia in right lung and found mass in lung  . Pneumonitis 05/09/2017  . Primary cancer of right upper lobe of lung (Emily Pope) 08/23/2016    Patient Active Problem List   Diagnosis Date Noted  . Pneumonitis 05/09/2017  . 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 GERD (gastroesophageal reflux disease) 10/11/2016  . Primary cancer of right upper lobe of lung ([place]) 08/23/2016    Past Surgical History:  Procedure Laterality Date  . ABDOMINAL HYSTERECTOMY    . APPENDECTOMY     when had hysterectomy  . CARPAL TUNNEL RELEASE Bilateral   . ENDOBRONCHIAL ULTRASOUND Bilateral 08/23/2016   Procedure: ENDOBRONCHIAL ULTRASOUND;  Surgeon: Collene Gobble, MD;  Location: [facility];  Service: Cardiopulmonary;  Laterality: Bilateral;  . HERNIA REPAIR     left inguinal hernia age 16  . PORTACATH PLACEMENT Left 09/17/2016   Procedure: INSERTION PORT-A-CATH LEFT SUBCLAVIAN;  Surgeon: Aviva Signs, MD;  Location: [facility];  Service: General;  Laterality: Left;  . TUBAL LIGATION      OB History    No  data available       Home Medications    Prior to Admission medications   Medication Sig Start Date End Date Taking? Authorizing Provider  albuterol (PROVENTIL HFA;VENTOLIN HFA) 108 (90 Base) MCG/ACT inhaler Inhale 2 puffs into the lungs every 6 (six) hours as needed for wheezing or shortness of breath. 07/26/16  Yes Sood, Elisabeth Cara, MD  Benzocaine-Menthol 10-2.1 MG LOZG Use as directed 1 lozenge in the mouth or throat daily as needed.   Yes [provider]  buPROPion (WELLBUTRIN XL) 150 MG 24 hr tablet Take 150 mg by mouth daily.   Yes [provider]  lidocaine (XYLOCAINE) 2 % solution Use as directed 10 mLs in the mouth or throat every 6 (six) hours as needed (painful swallowing). 02/24/17  Yes Carlis Stable, NP  lidocaine-prilocaine (EMLA) cream Apply 1 application topically as needed. 10/25/16  Yes Penland, Kelby Fam, MD  lisinopril-hydrochlorothiazide (PRINZIDE,ZESTORETIC) 20-12.5 MG tablet Take 1 tablet by mouth as needed.  07/26/16  Yes Chesley Mires, MD  meloxicam (MOBIC) 15 MG tablet Take 1 tablet (15 mg total) by mouth daily. 04/11/17  Yes Twana First, MD  ondansetron (ZOFRAN) 8 MG tablet Take 1 tablet (8 mg total) by mouth 2 (two) times daily as needed for refractory nausea / vomiting. Start on day 3 after chemo. 04/25/17  Yes Kefalas, Manon Hilding, PA-C  OVER THE COUNTER MEDICATION Nicotine mini tabs as needed  Yes [provider]  traMADol (ULTRAM) 50 MG tablet Take 1 tablet (50 mg total) by mouth every 6 (six) hours as needed for moderate pain. 05/27/17  Yes Holley Bouche, NP  vitamin B-12 (CYANOCOBALAMIN) 1000 MCG tablet Take 1,000 mcg by mouth daily.   Yes [provider]  Benzocaine-Glycerin-DM 5-30-5 %-MG/SPRAY LIQD Take 2 sprays by mouth 4 (four) times daily as needed. 06/06/17   Forest Gleason, MD  esomeprazole (NEXIUM) 20 MG capsule Take 1 capsule (20 mg total) by mouth daily at 12 noon. 03/11/17   Baird Cancer, PA-C  magic mouthwash w/lidocaine  SOLN Take 5 mLs by mouth 4 (four) times daily as needed (for pain with swallowing). Patient not taking: Reported on 06/06/2017 02/22/17   Holley Bouche, NP  predniSONE (DELTASONE) 10 MG tablet Start with 60 mg for 1 week decrease by 10 mg weekly once to 10 mg for 1 week may discontinue (should be on this for 6 weeks) 06/13/17   Twana First, MD    Family History Family History  Problem Relation Age of Onset  . Cancer Mother        lung  . Hypertension Mother   . Hypertension Father   . Diabetes Father   . Hypertension Sister   . Cancer Brother        lung  . Hypertension Sister   . Colon cancer Neg Hx     Social History Social History  Substance Use Topics  . Smoking status: Former Smoker    Packs/day: 2.00    Years: 43.00    Quit date: 07/20/2016  . Smokeless tobacco: Never Used  . Alcohol use No     Allergies   Clindamycin/lincomycin; Codeine; Penicillins; Vancomycin; Sulfa antibiotics; and Lincomycin   Review of Systems Review of Systems  All other systems reviewed and are negative.    Physical Exam Updated Vital Signs BP 128/87   Pulse (!) 106   Temp 98 F (36.7 C) (Oral)   Resp (!) 23   Ht 5' (1.524 m)   Wt 54.4 kg (120 lb)   LMP 08/04/1985 (Approximate) Comment: hysterectomy @ 61 years old  SpO2 93%   BMI 23.44 kg/m   Physical Exam  Constitutional: She is oriented to person, place, and time. She appears well-developed and well-nourished.  No acute distress  HENT:  Head: Normocephalic and atraumatic.  Eyes: Conjunctivae are normal.  Neck: Neck supple.  Cardiovascular: Normal rate and regular rhythm.   Pulmonary/Chest: Effort normal and breath sounds normal.  Abdominal: Soft. Bowel sounds are normal.  Musculoskeletal: Normal range of motion.  Neurological: She is alert and oriented to person, place, and time.  Skin: Skin is warm and dry.  Psychiatric: She has a normal mood and affect. Her behavior is normal.  Nursing note and vitals  reviewed.    ED Treatments / Results  Labs (all labs ordered are listed, but only abnormal results are displayed) Labs Reviewed  CBC WITH DIFFERENTIAL/PLATELET - Abnormal; Notable for the following:       Result Value   Platelets 409 (*)    All other components within normal limits  COMPREHENSIVE METABOLIC PANEL - Abnormal; Notable for the following:    Chloride 98 (*)    All other components within normal limits  TROPONIN I    EKG  EKG Interpretation  Date/Time:  Monday June 13 2017 09:38:46 EDT Ventricular Rate:  109 PR Interval:    QRS Duration: 92 QT Interval:  316 QTC  Calculation: 426 R Axis:   66 Text Interpretation:  Sinus tachycardia Multiform ventricular premature complexes Right atrial enlargement Confirmed by Nat Christen [id number]) on 06/13/2017 10:11:06 AM       Radiology Dg Chest 2 View  Result Date: 06/13/2017 CLINICAL DATA:  Shortness of breath, chest pain. EXAM: CHEST  2 VIEW COMPARISON:  CT scan of May 27, 2017.  Radiographs of May 25, 2017. FINDINGS: Stable right upper lobe and right peritracheal density is noted consistent with calcified mass. Left lung is clear. No pneumothorax or pleural effusion is noted. Left subclavian catheter is noted with distal tip in expected position of the SVC. Bony thorax is unremarkable. IMPRESSION: Stable densities seen in right upper lobe and right peritracheal region consistent with calcified mass. No significant change compared to prior exam. Electronically Signed   By: Marijo Conception, M.D.   On: 06/13/2017 10:33    Procedures Procedures (including critical care time)  Medications Ordered in ED Medications  ipratropium-albuterol (DUONEB) 0.5-2.5 (3) MG/3ML nebulizer solution 3 mL (3 mLs Nebulization Given 06/13/17 1027)  methylPREDNISolone sodium succinate (SOLU-MEDROL) 125 mg/2 mL injection 60 mg (60 mg Intravenous Given 06/13/17 1003)     Initial Impression / Assessment and Plan / ED Course  I have reviewed the  triage vital signs and the nursing notes.  Pertinent labs & imaging results that were available during my care of the patient were reviewed by me and considered in my medical decision making (see chart for details).     Patient is hemodynamically stable. Screening tests are acceptable. She feels much better after IV site Solumedrol and a Albuterol/Atrovent breathing treatment. She will follow-up with her oncology physician or return if worse  Final Clinical Impressions(s) / ED Diagnoses   Final diagnoses:  Dyspnea, unspecified type    New Prescriptions New Prescriptions   PREDNISONE (DELTASONE) 10 MG TABLET    Start with 60 mg for 1 week decrease by 10 mg weekly once to 10 mg for 1 week may discontinue (should be on this for 6 weeks)     Nat Christen, MD 06/13/17 1327

## 2017-06-22 ENCOUNTER — Encounter (HOSPITAL_BASED_OUTPATIENT_CLINIC_OR_DEPARTMENT_OTHER): Payer: Medicaid Other | Admitting: Oncology

## 2017-06-22 ENCOUNTER — Other Ambulatory Visit (HOSPITAL_COMMUNITY): Payer: Self-pay

## 2017-06-22 ENCOUNTER — Encounter (HOSPITAL_COMMUNITY): Payer: Self-pay

## 2017-06-22 VITALS — BP 163/106 | HR 94 | Resp 20 | Ht 60.0 in | Wt 124.0 lb

## 2017-06-22 DIAGNOSIS — J189 Pneumonia, unspecified organism: Secondary | ICD-10-CM | POA: Diagnosis not present

## 2017-06-22 DIAGNOSIS — C3411 Malignant neoplasm of upper lobe, right bronchus or lung: Secondary | ICD-10-CM | POA: Diagnosis not present

## 2017-06-22 MED ORDER — HEPARIN SOD (PORK) LOCK FLUSH 100 UNIT/ML IV SOLN
500.0000 [IU] | Freq: Once | INTRAVENOUS | Status: AC
Start: 2017-06-22 — End: 2017-06-22
  Administered 2017-06-22: 500 [IU] via INTRAVENOUS

## 2017-06-22 MED ORDER — SODIUM CHLORIDE 0.9% FLUSH: 10.0000 mL | INTRAVENOUS | Status: DC | PRN

## 2017-06-22 MED ORDER — MELOXICAM 15 MG PO TABS
15.0000 mg | ORAL_TABLET | Freq: Every day | ORAL | 1 refills | Status: DC
Start: 2017-06-22 — End: 2017-06-22

## 2017-06-22 MED ORDER — MELOXICAM 15 MG PO TABS: 15.0000 mg | ORAL_TABLET | Freq: Every day | ORAL | 1 refills | Status: DC

## 2017-06-22 NOTE — Progress Notes (Signed)
Handicap paperwork filled out for pt.

## 2017-06-22 NOTE — Progress Notes (Signed)
Emily Pope presented for Portacath access and flush. Portacath located lt chest wall accessed with  H 20 needle. Good blood return present. Portacath flushed with 28ml NS and 500U/45ml Heparin and needle removed intact. Procedure without incident. Patient tolerated procedure well.

## 2017-06-22 NOTE — Telephone Encounter (Signed)
Received refill request from patients pharmacy for Mobic. Reviewed with provider, chart checked and refilled.

## 2017-06-22 NOTE — Progress Notes (Signed)
[facility] Follow up:    [name], MD [address] 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  S/P concomitant chemoradiation with carboplatin/paclitaxel (09/20/2016- 10/25/2016) followed by 2 cycle of consolidative chemotherapy with carboplatin/paclitaxel (11/15/2016- 12/06/2016).  Now on consolidative durvalumab (Imfinzi) beginning on 01/26/2016 for up to 52 weeks.   AND Imfinzi-induced pneumonitis, grade 2, resulting in holding Imfinzi from 04/25/2017- 05/09/2017 with corticosteroids treatment with taper.  This resolved/improved with steroids. Developed pnemonitis again after rechallenge with imfinzi. On steroid taper again.       Primary cancer of right upper lobe of lung ([place])   08/04/2016 PET scan    PET IMPRESSION: 1. 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 Mass is confluent with the right superior hilum and right lower tracheal wall, suggestive of a T4 primary tumor. 2. Postobstructive pneumonia in the right upper lobe. 3. Hypermetabolic ipsilateral and contralateral mediastinal and contralateral hilar lymphadenopathy, suggestive of N3 nodal disease.      08/24/2016 Procedure    Bronchoscopy      08/25/2016 Pathology Results    Diagnosis Endobronchial biopsy, RUL - POORLY DIFFERENTIATED NON-SMALL CELL CARCINOMA. The immunophenotype is consistent with poorly differentiated adenocarcinoma.      09/10/2016 Imaging    MRI brain- No metastatic disease or acute  intracranial abnormality.      09/20/2016 - 10/25/2016 Chemotherapy    Carboplatin/Paclitaxel weekly x 6 with XRT.      09/20/2016 - 10/25/2016 Radiation Therapy         11/15/2016 - 12/06/2016 Chemotherapy    Carboplatin/paclitaxel every 21 days x 2 cycles.      12/30/2016 Imaging    CT CAP- 1. Central right upper lobe lung mass has mildly decreased since 11/21/2016 chest CT angiogram and significantly decreased since 08/04/2016 PET-CT. 2. No pathologically enlarged thoracic lymph nodes. Previously described hypermetabolic thoracic adenopathy on the 08/04/2016 PET-CT has decreased in size. 3. Previously described bilateral lower lobe pulmonary nodules are stable in size. 4. New solitary subsolid 4 mm right middle lobe pulmonary nodule is nonspecific, and may be inflammatory. Recommend attention on a follow-up chest CT in 3 months. 5. No definite metastatic disease in the abdomen. Tiny sub 5 mm low-attenuation lesions in the liver are too small to characterize, favor benign. 6. Aortic atherosclerosis. One vessel coronary atherosclerosis. 7. Moderate emphysema.      01/25/2017 -  Chemotherapy    Durvalumab (Imfinzi) x 52 weeks.       03/31/2017 Imaging    CT chest with contrast: Stable size of partially calcified mass in the posterior right upper lobe.  Increased airspace disease in paramediastinal right upper and superior right lower lobes. Differential diagnosis includes radiation pneumonitis, drug reaction, and infection.  Stable sub-cm right lower lobe pulmonary nodule.  No evidence of lymphadenopathy or pleural effusion.  Emphysema.  Aortic and coronary artery atherosclerosis.      04/25/2017 Adverse Reaction    Progressive cough, concerning for pneumonitis.      04/25/2017 Treatment Plan Change    Progressive cough concerning  for pneumonitis.  Treatment held.  Managed by steroids.      05/09/2017 Treatment Plan Change    Restart Imfinzi with  improvement of cough, Grade 1.      05/27/2017 Imaging    CT CHEST WITH CONTRAST IMPRESSION: 1. Similar size of partially calcified posterior right apical lung mass. 2. Similar to minimal increase in surrounding radiation fibrosis. 3. Subtle areas of mosaic attenuation are greater on the right. Likely attributed to mild ground-glass opacity. In the appropriate clinical setting, this could represent drug toxicity induced pneumonitis. 4.  Coronary artery atherosclerosis. Aortic atherosclerosis. 5. Similar right lower lobe pulmonary nodule.      05/27/2017 Adverse Reaction    Developed worsening cough and SOB again after imfinzi. Grade 2. Placed on long steroid taper.  Permanently discontinue any further imfinzi treatments.        INTERVAL HISTORY: Mea Ozga 61 y.o. female returns for follow-up for evaluation of improvement in her grade 2 pneumonitis induced by Imfinzi. Patient was started on a prednisone 60 mg PO daily taper last week,  currently she is on 50 mg by mouth daily with incremental decrease by 10 mg every week. She states that her shortness of breath and cough have improved tremendously. She still has a cough but it has improved. Overall she feels a lot better. 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 She states that she is sleeping well and eating well. Her hair is growing back.   Patient Active Problem List   Diagnosis Date Noted  . Pneumonitis 05/09/2017  . 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 GERD (gastroesophageal reflux disease) 10/11/2016  . Primary cancer of right upper lobe of lung ([place]) 08/23/2016    is allergic to clindamycin/lincomycin; codeine; penicillins; vancomycin; sulfa antibiotics; and lincomycin.  MEDICAL HISTORY: Past Medical History:  Diagnosis Date  . Anxiety    uses it for menopause   . Arthritis   . Cancer ([place])    Right Lung  . Chronic bronchitis ([place])   . COPD (chronic obstructive  pulmonary disease) ([place])   . GERD (gastroesophageal reflux disease)   . Headache    sinus  . Hypertension   . Pneumonia    07/22/16-had pneumonia in right lung and found mass in lung  . Pneumonitis 05/09/2017  . Primary cancer of right upper lobe of lung ([place]) 08/23/2016    SURGICAL HISTORY: Past Surgical History:  Procedure Laterality Date  . ABDOMINAL HYSTERECTOMY    . APPENDECTOMY     when had hysterectomy  . CARPAL TUNNEL RELEASE Bilateral   . ENDOBRONCHIAL ULTRASOUND Bilateral 08/23/2016   Procedure: ENDOBRONCHIAL ULTRASOUND;  Surgeon: Collene Gobble, MD;  Location: [facility];  Service: Cardiopulmonary;  Laterality: Bilateral;  . HERNIA REPAIR     left inguinal hernia age 52  . PORTACATH PLACEMENT Left 09/17/2016   Procedure: INSERTION PORT-A-CATH LEFT SUBCLAVIAN;  Surgeon: Aviva Signs, MD;  Location: [facility];  Service: General;  Laterality: Left;  . TUBAL LIGATION      SOCIAL HISTORY: Social History   Social History  . Marital status: Divorced    Spouse name: N/A  . Number of children: N/A  . Years of education: N/A   Occupational History  . Not on file.   Social History Main Topics  . Smoking status: Former Smoker    Packs/day: 2.00    Years: 43.00    Quit date: 07/20/2016  . Smokeless tobacco: Never Used  . Alcohol use No  .  Drug use: No  . Sexual activity: Not on file   Other Topics Concern  . Not on file   Social History Narrative  . No narrative on file    FAMILY HISTORY: Family History  Problem Relation Age of Onset  . Cancer Mother        lung  . Hypertension Mother   . Hypertension Father   . Diabetes Father   . Hypertension Sister   . Cancer Brother        lung  . Hypertension Sister   . Colon cancer Neg Hx     Review of Systems  Constitutional: Negative for appetite change, chills, fatigue and fever.  HENT:   Negative for hearing loss, lump/mass, mouth sores, sore throat and tinnitus.   Eyes: Negative for eye problems and icterus.   Respiratory: Positive for cough. Negative for chest tightness, hemoptysis, shortness of breath and wheezing.        Shortness of breath with exertion  Cardiovascular: Negative for chest pain, leg swelling and palpitations.  Gastrointestinal: Negative for abdominal distention, abdominal pain, blood in stool, diarrhea, nausea and vomiting.  Endocrine: Negative.  Negative for hot flashes.  Genitourinary: Negative for difficulty urinating, frequency and hematuria.   Musculoskeletal: Negative for arthralgias and neck pain.  Skin: Negative for itching and rash.  Neurological: Negative for dizziness, headaches and speech difficulty.  Hematological: Negative for adenopathy. Does not bruise/bleed easily.  Psychiatric/Behavioral: Negative for confusion. The patient is not nervous/anxious.       PHYSICAL EXAMINATION  ECOG PERFORMANCE STATUS: 1 - Symptomatic but completely ambulatory  Vitals:   06/22/17 0927  BP: (!) 163/106  Pulse: 94  Resp: 20    Physical Exam  Constitutional: She is oriented to person, place, and time and well-developed, well-nourished, and in no distress. No distress.  HENT:  Head: Normocephalic and atraumatic.  Mouth/Throat: No oropharyngeal exudate.  Eyes: Conjunctivae are normal. Pupils are equal, round, and reactive to light. No scleral icterus.  Neck: Normal range of motion. Neck supple. No JVD present.  Cardiovascular: Normal rate, regular rhythm and normal heart sounds.  Exam reveals no gallop and no friction rub.   No murmur heard. Pulmonary/Chest: Effort normal and breath sounds normal. No respiratory distress. She has no wheezes. She has no rales.  Abdominal: Soft. Bowel sounds are normal. She exhibits no distension. There is no tenderness. There is no guarding.  Musculoskeletal: She exhibits no edema or tenderness.  Lymphadenopathy:    She has no cervical adenopathy.  Neurological: She is alert and oriented to person, place, and time. No cranial nerve  deficit.  Skin: Skin is warm and dry. No rash noted. No erythema. No pallor.  Psychiatric: Affect and judgment normal.    LABORATORY DATA:  CBC    Component Value Date/Time   WBC 8.9 06/13/2017 0943   RBC 4.00 06/13/2017 0943   HGB 12.3 06/13/2017 0943   HGB 12.9 09/02/2016 1403   HCT 38.3 06/13/2017 0943   HCT 38.4 09/02/2016 1403   PLT 409 (H) 06/13/2017 0943   PLT 476 (H) 09/02/2016 1403   MCV 95.8 06/13/2017 0943   MCV 94.6 09/02/2016 1403   MCH 30.8 06/13/2017 0943   MCHC 32.1 06/13/2017 0943   RDW 15.0 06/13/2017 0943   RDW 13.6 09/02/2016 1403   LYMPHSABS 0.7 06/13/2017 0943   LYMPHSABS 2.5 09/02/2016 1403   MONOABS 0.7 06/13/2017 0943   MONOABS 0.7 09/02/2016 1403   EOSABS 0.1 06/13/2017 0943   EOSABS 0.7 (  H) 09/02/2016 1403   BASOSABS 0.0 06/13/2017 0943   BASOSABS 0.1 09/02/2016 1403    CMP     Component Value Date/Time   NA 137 06/13/2017 0943   NA 140 09/02/2016 1403   K 3.8 06/13/2017 0943   K 4.2 09/02/2016 1403   CL 98 (L) 06/13/2017 0943   CO2 28 06/13/2017 0943   CO2 27 09/02/2016 1403   GLUCOSE 94 06/13/2017 0943   GLUCOSE 92 09/02/2016 1403   BUN 12 06/13/2017 0943   BUN 15.6 09/02/2016 1403   CREATININE 0.93 06/13/2017 0943   CREATININE 0.8 09/02/2016 1403   CALCIUM 9.2 06/13/2017 0943   CALCIUM 9.6 09/02/2016 1403   PROT 7.6 06/13/2017 0943   PROT 8.0 09/02/2016 1403   ALBUMIN 3.5 06/13/2017 0943   ALBUMIN 3.6 09/02/2016 1403   AST 18 06/13/2017 0943   AST 12 09/02/2016 1403   ALT 27 06/13/2017 0943   ALT <9 09/02/2016 1403   ALKPHOS 126 06/13/2017 0943   ALKPHOS 159 (H) 09/02/2016 1403   BILITOT 0.6 06/13/2017 0943   BILITOT <0.30 09/02/2016 1403   GFRNONAA >60 06/13/2017 0943   GFRAA >60 06/13/2017 0943       PENDING LABS:     ASSESSMENT: 1. 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  S/P concomitant chemoradiation with carboplatin/paclitaxel (09/20/2016-  10/25/2016) followed by 2 cycle of consolidative chemotherapy with carboplatin/paclitaxel (11/15/2016- 12/06/2016).  Now on consolidative durvalumab (Imfinzi) beginning on 01/26/2016 for up to 52 weeks.    2. 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 Continue prednisone taper at this time as scheduled. Will due surveillance scans in 3 months. CT C/A/P ordered. I have advised her to call us if her pulmonary symptoms get worse despite her prednisone taper at which point we may need to repeat her scans earlier. I have advised her that it will take a few weeks for her pneumonitis to resolve completely. Port flush today and q8weeks. RTC in 3 months for follow up and to review her scans.  Orders Placed This Encounter  Procedures  . CT Abdomen Pelvis W Contrast    Standing Status:   Future    Standing Expiration Date:   06/22/2018    Order Specific Question:   If indicated for the ordered procedure, I authorize the administration of contrast media per Radiology protocol    Answer:   Yes    Order Specific Question:   Reason for Exam (SYMPTOM  OR DIAGNOSIS REQUIRED)    Answer:   restaging CT for stage III lung ca    Order Specific Question:   Is patient pregnant?    Answer:   No    Order Specific Question:   Preferred imaging location?    Answer:   [facility]    Order Specific Question:   Radiology Contrast Protocol - do NOT remove file path    Answer:   \\[email or web address]  . CT Chest W Contrast    Standing Status:   Future    Standing Expiration Date:   06/22/2018    Order Specific Question:   If indicated for the ordered procedure, I authorize the administration of contrast media per Radiology protocol    Answer:   Yes    Order Specific Question:   Reason for Exam (SYMPTOM  OR DIAGNOSIS REQUIRED)  Answer:   restaging CT for  stage III lung ca    Order Specific Question:   Is patient pregnant?    Answer:   No    Order Specific Question:   Preferred imaging location?    Answer:   [facility]    Order Specific Question:   Radiology Contrast Protocol - do NOT remove file path    Answer:   \\[email or web address]  . Schedule Portacath Flush Appointment    Schedule Portacath flush appointment    All questions were answered. The patient knows to call the clinic with any problems, questions or concerns. We can certainly see the patient much sooner if necessary. This note was electronically signed. Twana First, MD 06/22/2017

## 2017-07-11 ENCOUNTER — Other Ambulatory Visit (HOSPITAL_COMMUNITY): Payer: Self-pay | Admitting: Emergency Medicine

## 2017-07-11 MED ORDER — PREDNISONE 10 MG PO TABS: ORAL_TABLET | ORAL | 0 refills | Status: DC

## 2017-07-11 NOTE — Progress Notes (Signed)
Pt started getting more short of breathe once she weaned herself down to 30 mg.  She just went down to 20 mg today.  Also very dizzy.  Spoke with Dr Talbert Cage.  Going to give 2 more weeks of 40 mg then start wean by 10 mg again.

## 2017-07-18 ENCOUNTER — Telehealth (HOSPITAL_COMMUNITY): Payer: Self-pay | Admitting: Emergency Medicine

## 2017-07-18 NOTE — Telephone Encounter (Signed)
Pt called and stated that she is having really bad pain in her back, still coughing, and can only stand up for about 10 minutes at a time.  She has had two knots come up.  She is just worries that her cancer has come back.  Made her an appt to see the doctor tomorrow at 9:10 am.  She verbalized understanding.

## 2017-07-19 ENCOUNTER — Encounter (HOSPITAL_COMMUNITY): Payer: Medicaid Other

## 2017-07-19 ENCOUNTER — Encounter (HOSPITAL_COMMUNITY): Payer: Medicaid Other | Attending: Hematology & Oncology | Admitting: Oncology

## 2017-07-19 ENCOUNTER — Encounter (HOSPITAL_COMMUNITY): Payer: Self-pay | Admitting: Oncology

## 2017-07-19 DIAGNOSIS — C3411 Malignant neoplasm of upper lobe, right bronchus or lung: Secondary | ICD-10-CM | POA: Insufficient documentation

## 2017-07-19 DIAGNOSIS — Z9221 Personal history of antineoplastic chemotherapy: Secondary | ICD-10-CM | POA: Diagnosis not present

## 2017-07-19 LAB — COMPREHENSIVE METABOLIC PANEL
ALBUMIN: 3.7 g/dL (ref 3.5–5.0)
ALT: 21 U/L (ref 14–54)
ANION GAP: 7 (ref 5–15)
AST: 14 U/L — AB (ref 15–41)
Alkaline Phosphatase: 78 U/L (ref 38–126)
BILIRUBIN TOTAL: 0.5 mg/dL (ref 0.3–1.2)
BUN: 21 mg/dL — AB (ref 6–20)
CHLORIDE: 101 mmol/L (ref 101–111)
CO2: 32 mmol/L (ref 22–32)
Calcium: 9.1 mg/dL (ref 8.9–10.3)
Creatinine, Ser: 0.92 mg/dL (ref 0.44–1.00)
GFR calc Af Amer: >60 mL/min (ref >60)
GFR calc non Af Amer: >60 mL/min (ref >60)
GLUCOSE: 79 mg/dL (ref 65–99)
Potassium: 3.5 mmol/L (ref 3.5–5.1)
SODIUM: 140 mmol/L (ref 135–145)
TOTAL PROTEIN: 6.7 g/dL (ref 6.5–8.1)

## 2017-07-19 MED ORDER — TRAMADOL HCL 50 MG PO TABS: 50.0000 mg | ORAL_TABLET | Freq: Four times a day (QID) | ORAL | 0 refills | Status: DC | PRN

## 2017-07-19 NOTE — Patient Instructions (Signed)
[place] at [facility] Discharge Instructions  RECOMMENDATIONS MADE BY THE CONSULTANT AND ANY TEST RESULTS WILL BE SENT TO YOUR REFERRING PHYSICIAN.  You were seen today by Kirby Crigler PA-C. Refill given today for tramodol. Moving CT up to next week. Follow up after scans.   Thank you for choosing [place] at [facility] to provide your oncology and hematology care.  To afford each patient quality time with our provider, please arrive at least 15 minutes before your scheduled appointment time.    If you have a lab appointment with the [facility] please come in thru the  Main Entrance and check in at the main information desk  You need to re-schedule your appointment should you arrive 10 or more minutes late.  We strive to give you quality time with our providers, and arriving late affects you and other patients whose appointments are after yours.  Also, if you no show three or more times for appointments you may be dismissed from the clinic at the providers discretion.     Again, thank you for choosing [facility].  Our hope is that these requests will decrease the amount of time that you wait before being seen by our physicians.       _____________________________________________________________  Should you have questions after your visit to [facility], please contact our office at [phone] between the hours of 8:30 a.m. and 4:30 p.m.  Voicemails left after 4:30 p.m. will not be returned until the following business day.  For prescription refill requests, have your pharmacy contact our office.       Resources For Cancer Patients and their Caregivers ? American Cancer Society: Can assist with transportation, wigs, general needs, runs Look Good Feel Better.        936-009-2805 ? Cancer Care: Provides financial assistance, online support groups, medication/co-pay assistance.  1-800-813-HOPE  (661)386-7430) ? [place] Assists [place] cancer patients and their families through emotional , educational and financial support.  (518)014-1913 ? [place] DSS Where to apply for food stamps, Medicaid and utility assistance. 725-824-1315 ? RCATS: Transportation to medical appointments. 864-073-1030 ? Social Security Administration: May apply for disability if have a Stage IV cancer. 5416247962 [phone] ? [employer], Disability and Transit Services: Assists with nutrition, care and transit needs. [place] Support Programs: @10RELATIVEDAYS @ > Cancer Support Group  2nd Tuesday of the month 1pm-2pm, Journey Room  > Creative Journey  3rd Tuesday of the month 1130am-1pm, Journey Room  > Look Good Feel Better  1st Wednesday of the month 10am-12 noon, Journey Room (Call [facility] to register [phone])

## 2017-07-19 NOTE — Progress Notes (Signed)
Patient is seen as a work in today.  She called the clinic yesterday reporting to new "bumps."  She is concerned about her cancer returning.  She shows me a extremely small hyperkeratotic-like skin lesion on the left aspect of her forehead (frontal).  It measures approximately 1 mm in size.  It is raised.  Minimally darker than surrounding tissue.  We will have to monitor for an early skin cancer.  She continues with her dry cough.  She denies any sputum production.  She was initiated on a prednisone taper down to 30 mg but noted an increase in her cough.  As result, her prednisone was increased back to 40 mg.  She reports that her cough is no better.  She continues on her 40 mg of prednisone.  She is developing mood bases.  Also developing enlarged supraclavicular fat pads.  Left greater than right.  Given her concern, I will move her imaging scans.  We will perform CT scans in 1-2 weeks.  This will be approximately 8 weeks from her previous scans that were negative for any recurrence or progression of disease.  She is a anxious person and she reports a recent family friend who had significant progression of therapy lung cancer.  If CT scans are stable, then we will need to strongly consider tapering her prednisone, particularly in light that her cough continues despite 40 mg of prednisone.  May need to consider referral to pulmonology as well.  Vitals:   07/19/17 0917  BP: (!) 141/87  Pulse: 95  Resp: 20  Temp: 98.3 F (36.8 C)   Gen: A and O x 3.  NAD.  Coughing. Skin: Right frontal skull lesion, measuring 1 mm in size and palpable.  Minimally raised.  Hyperkeratotic.   Neck: Trachea midline, supple Head: Developing moon facies Supraclavicular region: Increased fat pads, L>R Lungs: CTA B/L without wheezes, rales, or rhonchi Cardiac: RRR Neuro: No focal deficits.  Patient will return following her CT imaging.

## 2017-07-29 ENCOUNTER — Ambulatory Visit (HOSPITAL_COMMUNITY)
Admission: RE | Admit: 2017-07-29 | Discharge: 2017-07-29 | Disposition: A | Discharge status: Home / Self Care | Payer: Medicaid Other | Source: Ambulatory Visit | Attending: Oncology | Admitting: Oncology

## 2017-07-29 DIAGNOSIS — J439 Emphysema, unspecified: Secondary | ICD-10-CM | POA: Insufficient documentation

## 2017-07-29 DIAGNOSIS — I700 Atherosclerosis of aorta: Secondary | ICD-10-CM | POA: Diagnosis not present

## 2017-07-29 DIAGNOSIS — C3411 Malignant neoplasm of upper lobe, right bronchus or lung: Secondary | ICD-10-CM | POA: Diagnosis present

## 2017-07-29 DIAGNOSIS — I2510 Atherosclerotic heart disease of native coronary artery without angina pectoris: Secondary | ICD-10-CM | POA: Insufficient documentation

## 2017-07-29 MED ORDER — IOPAMIDOL (ISOVUE-300) INJECTION 61%
100.0000 mL | Freq: Once | INTRAVENOUS | Status: AC | PRN
  Administered 2017-07-29: 100 mL via INTRAVENOUS

## 2017-08-02 ENCOUNTER — Encounter: Payer: Self-pay | Admitting: *Deleted

## 2017-08-02 ENCOUNTER — Encounter (HOSPITAL_COMMUNITY): Payer: Medicaid Other | Attending: Hematology & Oncology | Admitting: Adult Health

## 2017-08-02 ENCOUNTER — Encounter (HOSPITAL_COMMUNITY): Payer: Self-pay | Admitting: Adult Health

## 2017-08-02 VITALS — BP 138/91 | HR 105 | Temp 98.1°F | Resp 20 | Wt 132.6 lb

## 2017-08-02 DIAGNOSIS — C3411 Malignant neoplasm of upper lobe, right bronchus or lung: Secondary | ICD-10-CM | POA: Insufficient documentation

## 2017-08-02 DIAGNOSIS — M62838 Other muscle spasm: Secondary | ICD-10-CM

## 2017-08-02 DIAGNOSIS — M2550 Pain in unspecified joint: Secondary | ICD-10-CM

## 2017-08-02 DIAGNOSIS — M791 Myalgia: Secondary | ICD-10-CM | POA: Diagnosis not present

## 2017-08-02 DIAGNOSIS — Z9221 Personal history of antineoplastic chemotherapy: Secondary | ICD-10-CM | POA: Insufficient documentation

## 2017-08-02 MED ORDER — CYCLOBENZAPRINE HCL 5 MG PO TABS: 5.0000 mg | ORAL_TABLET | Freq: Three times a day (TID) | ORAL | 0 refills | Status: DC | PRN

## 2017-08-02 NOTE — Progress Notes (Signed)
[facility] Clinical Social Work  Clinical Social Work was referred by Engineer, mining for assessment of psychosocial needs due to pt stating her medicaid would end due to social security starting soon and having an increase in income. Clinical Social Worker contacted patient at home to offer support and assess for needs.  CSW left brief message explaining process about going to DSS and social security. Pt may need to look at [facility] insurance options as she has a two year waiting period before she is eligible for medicare. CSW encouraged pt to return call as needed.     Clinical Social Work interventions: Resource education and referral  Loren Racer, LCSW, OSW-C [place] T[facility]   [phone]

## 2017-08-02 NOTE — Patient Instructions (Signed)
[place] at [facility] Discharge Instructions  RECOMMENDATIONS MADE BY THE CONSULTANT AND ANY TEST RESULTS WILL BE SENT TO YOUR REFERRING PHYSICIAN.  You were seen today by Mike Craze NP. Abby Potash will call you! Start your prednisone taper. Take 20mg  daily thru 08/07/17, then take 10mg  daily 08/08/17-08/14/17, then take 10mg  every other day 08/15/17-08/21/17 then stop. Try taking Flexeril for muscle spasms. Return in 2 months for labs and follow up.   Thank you for choosing [place] at [facility] to provide your oncology and hematology care.  To afford each patient quality time with our provider, please arrive at least 15 minutes before your scheduled appointment time.    If you have a lab appointment with the [facility] please come in thru the  Main Entrance and check in at the main information desk  You need to re-schedule your appointment should you arrive 10 or more minutes late.  We strive to give you quality time with our providers, and arriving late affects you and other patients whose appointments are after yours.  Also, if you no show three or more times for appointments you may be dismissed from the clinic at the providers discretion.     Again, thank you for choosing [facility].  Our hope is that these requests will decrease the amount of time that you wait before being seen by our physicians.       _____________________________________________________________  Should you have questions after your visit to [facility], please contact our office at [phone] between the hours of 8:30 a.m. and 4:30 p.m.  Voicemails left after 4:30 p.m. will not be returned until the following business day.  For prescription refill requests, have your pharmacy contact our office.       Resources For Cancer Patients and their Caregivers ? American Cancer Society: Can assist with transportation, wigs, general needs,  runs Look Good Feel Better.        (662)702-0874 ? Cancer Care: Provides financial assistance, online support groups, medication/co-pay assistance.  1-800-813-HOPE (732)244-8257) ? [place] Assists [place] cancer patients and their families through emotional , educational and financial support.  316-114-9929 ? [place] DSS Where to apply for food stamps, Medicaid and utility assistance. 838-556-0301 ? RCATS: Transportation to medical appointments. 484-060-6176 ? Social Security Administration: May apply for disability if have a Stage IV cancer. 660-170-7689 [phone] ? [employer], Disability and Transit Services: Assists with nutrition, care and transit needs. [place] Support Programs: @10RELATIVEDAYS @ > Cancer Support Group  2nd Tuesday of the month 1pm-2pm, Journey Room  > Creative Journey  3rd Tuesday of the month 1130am-1pm, Journey Room  > Look Good Feel Better  1st Wednesday of the month 10am-12 noon, Journey Room (Call [facility] to register [phone])

## 2017-08-02 NOTE — Progress Notes (Signed)
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  S/P concomitant chemoradiation with carboplatin/paclitaxel (09/20/2016- 10/25/2016) followed by 2 cycle of consolidative chemotherapy with carboplatin/paclitaxel (11/15/2016- 12/06/2016).  Unable to tolerate consolidative durvalumab (Imfinzi) beginning on 01/26/2016 and stopped 05/2017.   AND Imfinzi-induced pneumonitis, grade 2, resulting in holding Imfinzi from 04/25/2017- 05/09/2017 with corticosteroids treatment with taper.  This resolved/improved with steroids. Developed pnemonitis again after rechallenge with imfinzi. On steroid taper again.       Primary cancer of right upper lobe of lung ([facility])   08/04/2016 PET scan    PET IMPRESSION: 1. 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 Mass is confluent with the right superior hilum and right lower tracheal wall, suggestive of a T4 primary tumor. 2. Postobstructive pneumonia in the right upper lobe. 3. Hypermetabolic ipsilateral and contralateral mediastinal and contralateral hilar lymphadenopathy, suggestive of N3 nodal disease.      08/24/2016 Procedure    Bronchoscopy      08/25/2016 Pathology Results    Diagnosis Endobronchial biopsy, RUL - POORLY DIFFERENTIATED NON-SMALL CELL CARCINOMA. The immunophenotype is consistent with poorly differentiated adenocarcinoma.      09/10/2016 Imaging    MRI brain-  No metastatic disease or acute intracranial abnormality.      09/20/2016 - 10/25/2016 Chemotherapy    Carboplatin/Paclitaxel weekly x 6 with XRT.      09/20/2016 - 10/25/2016 Radiation Therapy         11/15/2016 - 12/06/2016 Chemotherapy    Carboplatin/paclitaxel every 21 days x 2 cycles.      12/30/2016 Imaging    CT CAP- 1. Central right upper lobe lung mass has mildly decreased since 11/21/2016 chest CT angiogram and significantly decreased since 08/04/2016 PET-CT. 2. No pathologically enlarged thoracic lymph nodes. Previously described hypermetabolic thoracic adenopathy on the 08/04/2016 PET-CT has decreased in size. 3. Previously described bilateral lower lobe pulmonary nodules are stable in size. 4. New solitary subsolid 4 mm right middle lobe pulmonary nodule is nonspecific, and may be inflammatory. Recommend attention on a follow-up chest CT in 3 months. 5. No definite metastatic disease in the abdomen. Tiny sub 5 mm low-attenuation lesions in the liver are too small to characterize, favor benign. 6. Aortic atherosclerosis. One vessel coronary atherosclerosis. 7. Moderate emphysema.      01/25/2017 -  Chemotherapy    Durvalumab (Imfinzi) x 52 weeks.       03/31/2017 Imaging    CT chest with contrast: Stable size of partially calcified mass in the posterior right upper lobe.  Increased airspace disease in paramediastinal right upper and superior right lower lobes. Differential diagnosis includes radiation pneumonitis, drug reaction, and infection.  Stable sub-cm right lower lobe pulmonary nodule.  No evidence of lymphadenopathy or pleural effusion.  Emphysema.  Aortic and coronary artery atherosclerosis.      04/25/2017 Adverse Reaction    Progressive cough, concerning for pneumonitis.      04/25/2017 Treatment Plan Change    Progressive cough concerning  for pneumonitis.  Treatment held.  Managed by steroids.      05/09/2017 Treatment Plan Change     Restart Imfinzi with improvement of cough, Grade 1.      05/27/2017 Imaging    CT CHEST WITH CONTRAST IMPRESSION: 1. Similar size of partially calcified posterior right apical lung mass. 2. Similar to minimal increase in surrounding radiation fibrosis. 3. Subtle areas of mosaic attenuation are greater on the right. Likely attributed to mild ground-glass opacity. In the appropriate clinical setting, this could represent drug toxicity induced pneumonitis. 4.  Coronary artery atherosclerosis. Aortic atherosclerosis. 5. Similar right lower lobe pulmonary nodule.      05/27/2017 Adverse Reaction    Developed worsening cough and SOB again after imfinzi. Grade 2. Placed on long steroid taper.  Permanently discontinue any further imfinzi treatments.      07/29/2017 Imaging    CT chest/abd/pelvis: IMPRESSION: 1. Stable post treatment related changes of mass-like radiation fibrosis in the upper right lung with similar appearance of densely calcified mass in the apex of the right upper lobe. No finding to suggest local recurrence of disease or metastatic disease in the chest, abdomen or pelvis on today's examination. 2. 7 mm subpleural nodule in the periphery of the right lower lobe is stable and favored to be benign. Continued attention on followup studies is recommended to ensure stability. 3. Aortic atherosclerosis, in addition to left anterior descending coronary artery disease. Please note that although the presence of coronary artery calcium documents the presence of coronary artery disease, the severity of this disease and any potential stenosis cannot be assessed on this non-gated CT examination. Assessment for potential risk factor modification, dietary therapy or pharmacologic therapy may be warranted, if clinically indicated. 4. There are calcifications of the mitral valve. Echocardiographic correlation for evaluation of potential valvular dysfunction may be warranted if clinically  indicated. 5. Mild diffuse bronchial wall thickening with mild to moderate centrilobular and mild paraseptal emphysema; imaging findings suggestive of underlying COPD. 6. Tip of left subclavian Port-A-Cath is now misdirected into the subclavian vein.        HISTORY OF PRESENT ILLNESS:  (From Kirby Crigler, PA-C's note on 02/02/17)      INTERVAL HISTORY:  Emily Pope 61 y.o. female returns for routine follow-up for lung cancer.   She is here today with her husband.  Overall, she tells me she is "beginning to start to feel a little better."  Her appetite is 75%; energy levels 25%.  Her biggest complaints today are back pain, which has been chronic, as well as myalgias "where I have some burning pain between my shoulder blades and down the sides of my back."  She takes Tramadol for pain, which is effective.  States that she feels very weak at times, particularly after standing for ~10 minutes.  She is trying to work on her endurance/stamina.   Continues to have some cough & shortness of breath, but this has improved quite a bit in the past week or so.  She remains on prednisone and has been working on her long taper.  Currently on 20 mg daily through Sunday (08/07/17); she knows she is supposed to take 10 mg daily after that for 1 week, but would like clarification on her steroid taper schedule.    Also reports financial concerns today. States "I get too much money from my disability check to qualify for Medicaid by about $288 a month. So I'm trying to figure out how I can give that money  back to them so I can keep my Medicaid."  She has plans to go to the Social Security office tomorrow to get some assistance.  We discussed having our social worker give her a call to see if she may have any additional ideas; Ms. Biggs and her husband are enthusiastic about this option. I will ask Loren Racer, LCSW to give them a call.   She recently had restaging CT scans and is here to discuss those results.         REVIEW OF SYSTEMS:  Review of Systems  Constitutional: Positive for fatigue.  HENT:   Positive for trouble swallowing.   Eyes: Negative.   Respiratory: Positive for cough and shortness of breath.   Cardiovascular: Negative.  Negative for leg swelling.  Gastrointestinal: Negative.  Negative for abdominal pain, blood in stool, constipation, diarrhea, nausea and vomiting.  Endocrine: Negative.   Genitourinary: Negative.  Negative for dysuria, hematuria and vaginal bleeding.   Musculoskeletal: Positive for arthralgias and myalgias.  Skin: Negative.  Negative for rash.  Neurological: Positive for dizziness.  Hematological: Bruises/bleeds easily.  Psychiatric/Behavioral: The patient is nervous/anxious.      PAST MEDICAL/SURGICAL HISTORY:  Past Medical History:  Diagnosis Date  . Anxiety    uses it for menopause   . Arthritis   . Cancer ([facility])    Right Lung  . Chronic bronchitis ([facility])   . COPD (chronic obstructive pulmonary disease) ([facility])   . GERD (gastroesophageal reflux disease)   . Headache    sinus  . Hypertension   . Pneumonia    07/22/16-had pneumonia in right lung and found mass in lung  . Pneumonitis 05/09/2017  . Primary cancer of right upper lobe of lung ([facility]) 08/23/2016   Past Surgical History:  Procedure Laterality Date  . ABDOMINAL HYSTERECTOMY    . APPENDECTOMY     when had hysterectomy  . CARPAL TUNNEL RELEASE Bilateral   . ENDOBRONCHIAL ULTRASOUND Bilateral 08/23/2016   Procedure: ENDOBRONCHIAL ULTRASOUND;  Surgeon: Collene Gobble, MD;  Location: [facility];  Service: Cardiopulmonary;  Laterality: Bilateral;  . HERNIA REPAIR     left inguinal hernia age 88  . PORTACATH PLACEMENT Left 09/17/2016   Procedure: INSERTION PORT-A-CATH LEFT SUBCLAVIAN;  Surgeon: Aviva Signs, MD;  Location: [facility];  Service: General;  Laterality: Left;  . TUBAL LIGATION       SOCIAL HISTORY:  Social History   Social History  . Marital status: Divorced    Spouse name:  N/A  . Number of children: N/A  . Years of education: N/A   Occupational History  . Not on file.   Social History Main Topics  . Smoking status: Former Smoker    Packs/day: 2.00    Years: 43.00    Quit date: 07/20/2016  . Smokeless tobacco: Never Used  . Alcohol use No  . Drug use: No  . Sexual activity: Not on file   Other Topics Concern  . Not on file   Social History Narrative  . No narrative on file    FAMILY HISTORY:  Family History  Problem Relation Age of Onset  . Cancer Mother        lung  . Hypertension Mother   . Hypertension Father   . Diabetes Father   . Hypertension Sister   . Cancer Brother        lung  . Hypertension Sister   . Colon cancer Neg Hx     CURRENT MEDICATIONS:  Outpatient Encounter Prescriptions  as of 08/02/2017  Medication Sig  . albuterol (PROVENTIL HFA;VENTOLIN HFA) 108 (90 Base) MCG/ACT inhaler Inhale 2 puffs into the lungs every 6 (six) hours as needed for wheezing or shortness of breath.  . Benzocaine-Glycerin-DM 5-30-5 %-MG/SPRAY LIQD Take 2 sprays by mouth 4 (four) times daily as needed.  . Benzocaine-Menthol 10-2.1 MG LOZG Use as directed 1 lozenge in the mouth or throat daily as needed.  Marland Kitchen buPROPion (WELLBUTRIN XL) 150 MG 24 hr tablet Take 150 mg by mouth daily.  . cyclobenzaprine (FLEXERIL) 5 MG tablet Take 1 tablet (5 mg total) by mouth 3 (three) times daily as needed for muscle spasms.  Marland Kitchen esomeprazole (NEXIUM) 20 MG capsule Take 1 capsule (20 mg total) by mouth daily at 12 noon.  . lidocaine (XYLOCAINE) 2 % solution Use as directed 10 mLs in the mouth or throat every 6 (six) hours as needed (painful swallowing).  Marland Kitchen lidocaine-prilocaine (EMLA) cream Apply 1 application topically as needed.  Marland Kitchen lisinopril-hydrochlorothiazide (PRINZIDE,ZESTORETIC) 20-12.5 MG tablet Take 1 tablet by mouth as needed.   . magic mouthwash w/lidocaine SOLN Take 5 mLs by mouth 4 (four) times daily as needed (for pain with swallowing).  . meloxicam (MOBIC)  15 MG tablet Take 1 tablet (15 mg total) by mouth daily.  . ondansetron (ZOFRAN) 8 MG tablet Take 1 tablet (8 mg total) by mouth 2 (two) times daily as needed for refractory nausea / vomiting. Start on day 3 after chemo.  Marland Kitchen OVER THE COUNTER MEDICATION Nicotine mini tabs as needed  . predniSONE (DELTASONE) 10 MG tablet Start with 60 mg for 1 week decrease by 10 mg weekly once to 10 mg for 1 week may discontinue (should be on this for 6 weeks)  . predniSONE (DELTASONE) 10 MG tablet 40 mg daily for 2 weeks then wean by 10 mg weekly.  . traMADol (ULTRAM) 50 MG tablet Take 1 tablet (50 mg total) by mouth every 6 (six) hours as needed for moderate pain.  . vitamin B-12 (CYANOCOBALAMIN) 1000 MCG tablet Take 1,000 mcg by mouth daily.   No facility-administered encounter medications on file as of 08/02/2017.     ALLERGIES:  Allergies  Allergen Reactions  . Clindamycin/Lincomycin Rash  . Codeine Anaphylaxis  . Penicillins Anaphylaxis    Has patient had a PCN reaction causing immediate rash, facial/tongue/throat swelling, SOB or lightheadedness with hypotension: Unknown Has patient had a PCN reaction causing severe rash involving mucus membranes or skin necrosis: Unknown Has patient had a PCN reaction that required hospitalization: No Has patient had a PCN reaction occurring within the last 10 years: No If all of the above answers are "NO", then may proceed with Cephalosporin use.   . Vancomycin Itching    Scalp itching  . Sulfa Antibiotics Nausea Only  . Lincomycin Rash    Also Clindamycin as it has lincomycin in it     PHYSICAL EXAM:  ECOG Performance status: 1-2 - Symptomatic, requires occasional assistance but largely independent.   Vitals:   08/02/17 0816  BP: (!) 138/91  Pulse: (!) 105  Resp: 20  Temp: 98.1 F (36.7 C)   Filed Weights   08/02/17 0816  Weight: 132 lb 9.6 oz (60.1 kg)    Physical Exam  Constitutional: She is oriented to person, place, and time and  well-developed, well-nourished, and in no distress.  -Moon facies secondary to steroids   HENT:  Head: Normocephalic.  Mouth/Throat: Oropharynx is clear and moist. No oropharyngeal exudate.  Eyes: Pupils are equal,  round, and reactive to light. Conjunctivae are normal. No scleral icterus.  Neck: Normal range of motion. Neck supple.  Fat pad noted to supraclavicular regions   Cardiovascular: Normal rate and regular rhythm.   Pulmonary/Chest: Effort normal. She has wheezes (RLL expiratory wheeze. LUL diminished, otherwise clear to auscultation ).  Abdominal: Soft. Bowel sounds are normal. There is no tenderness.  Musculoskeletal: Normal range of motion. She exhibits no edema.  Lymphadenopathy:    She has no cervical adenopathy.  Neurological: She is alert and oriented to person, place, and time. No cranial nerve deficit. Gait normal.  Skin: Skin is warm and dry. No rash noted.  Psychiatric: Mood, memory, affect and judgment normal.  Nursing note and vitals reviewed.    LABORATORY DATA:  I have reviewed the labs as listed.  CBC    Component Value Date/Time   WBC 8.9 06/13/2017 0943   RBC 4.00 06/13/2017 0943   HGB 12.3 06/13/2017 0943   HGB 12.9 09/02/2016 1403   HCT 38.3 06/13/2017 0943   HCT 38.4 09/02/2016 1403   PLT 409 (H) 06/13/2017 0943   PLT 476 (H) 09/02/2016 1403   MCV 95.8 06/13/2017 0943   MCV 94.6 09/02/2016 1403   MCH 30.8 06/13/2017 0943   MCHC 32.1 06/13/2017 0943   RDW 15.0 06/13/2017 0943   RDW 13.6 09/02/2016 1403   LYMPHSABS 0.7 06/13/2017 0943   LYMPHSABS 2.5 09/02/2016 1403   MONOABS 0.7 06/13/2017 0943   MONOABS 0.7 09/02/2016 1403   EOSABS 0.1 06/13/2017 0943   EOSABS 0.7 (H) 09/02/2016 1403   BASOSABS 0.0 06/13/2017 0943   BASOSABS 0.1 09/02/2016 1403   CMP Latest Ref Rng & Units 07/19/2017 06/13/2017 05/09/2017  Glucose 65 - 99 mg/dL 79 94 83  BUN 6 - 20 mg/dL 21(H) 12 20  Creatinine 0.44 - 1.00 mg/dL 0.92 0.93 0.92  Sodium 135 - 145 mmol/L 140  137 138  Potassium 3.5 - 5.1 mmol/L 3.5 3.8 4.2  Chloride 101 - 111 mmol/L 101 98(L) 100(L)  CO2 22 - 32 mmol/L 32 28 30  Calcium 8.9 - 10.3 mg/dL 9.1 9.2 8.9  Total Protein 6.5 - 8.1 g/dL 6.7 7.6 6.9  Total Bilirubin 0.3 - 1.2 mg/dL 0.5 0.6 0.6  Alkaline Phos 38 - 126 U/L 78 126 112  AST 15 - 41 U/L 14(L) 18 15  ALT 14 - 54 U/L 21 27 18     PENDING LABS:    DIAGNOSTIC IMAGING:  CT chest/abd/pelvis: 07/29/17 CLINICAL DATA:  61 year old female with history of right-sided lung cancer. Followup study.  EXAM: CT CHEST, ABDOMEN, AND PELVIS WITH CONTRAST  TECHNIQUE: Multidetector CT imaging of the chest, abdomen and pelvis was performed following the standard protocol during bolus administration of intravenous contrast.  CONTRAST:  159mL ISOVUE-300 IOPAMIDOL (ISOVUE-300) INJECTION 61%  COMPARISON:  Multiple priors, most recently chest CT 05/27/2017 and CT the chest, abdomen and pelvis 12/29/2016.  FINDINGS: CT CHEST FINDINGS  Cardiovascular: Heart size is normal. There is no significant pericardial fluid, thickening or pericardial calcification. There is aortic atherosclerosis, as well as atherosclerosis of the great vessels of the mediastinum and the coronary arteries, including calcified atherosclerotic plaque in the left anterior descending coronary arteries. Calcifications of the posterior leaflet of the mitral valve. Left subclavian single-lumen porta cath with tip terminating in the azygos vein (axial image 14 of series 2).  Mediastinum/Nodes: No pathologically enlarged mediastinal or hilar lymph nodes. Esophagus is unremarkable in appearance. No axillary lymphadenopathy.  Lungs/Pleura: Previously noted heterogeneously  calcified mass in the apex of the right upper lobe is similar in appearance to the prior study measuring approximately 4.4 x 3.9 cm on today's examination. Surrounding areas of architectural distortion with chronic volume loss, traction  bronchiectasis, thickening of the peribronchovascular interstitium and septal thickening are compatible with chronic postradiation fibrosis, most evident throughout the paramediastinal aspect of the right upper lobe and superior segment of the right lower lobe, as well as medial aspect of the left upper lobe to a lesser extent. 7 mm subpleural nodule in the periphery of the right lower lobe (axial image 99 of series 3) is stable on prior examinations dating back to 08/04/2016, favored to be a benign subpleural lymph node. No other suspicious appearing pulmonary nodules or masses are noted. No acute consolidative airspace disease. No pleural effusions. Mild diffuse bronchial wall thickening with mild to moderate centrilobular and mild paraseptal emphysema.  Musculoskeletal: There are no aggressive appearing lytic or blastic lesions noted in the visualized portions of the skeleton.  CT ABDOMEN PELVIS FINDINGS  Hepatobiliary: No suspicious-appearing cystic or solid hepatic lesions. No intra or extrahepatic biliary ductal dilatation. Gallbladder is normal in appearance.  Pancreas: No pancreatic mass. No pancreatic ductal dilatation. No pancreatic or peripancreatic fluid or inflammatory changes.  Spleen: Unremarkable.  Adrenals/Urinary Tract: Bilateral kidneys and bilateral adrenal glands are normal in appearance. There is no hydroureteronephrosis. Urinary bladder is normal in appearance.  Stomach/Bowel: Small diverticulum extending off the cardia of the stomach incidentally noted. Stomach is otherwise normal in appearance. There is no pathologic dilatation of small bowel or colon. The appendix is not confidently identified and may be surgically absent. Regardless, there are no inflammatory changes noted adjacent to the cecum to suggest the presence of an acute appendicitis at this time.  Vascular/Lymphatic: Aortic atherosclerosis, without evidence of aneurysm or  dissection in the abdominal or pelvic vasculature. No lymphadenopathy identified in the abdomen or pelvis.  Reproductive: Status post hysterectomy.  Ovaries are atrophic.  Other: No significant volume of ascites.  No pneumoperitoneum.  Musculoskeletal: There are no aggressive appearing lytic or blastic lesions noted in the visualized portions of the skeleton.  IMPRESSION: 1. Stable post treatment related changes of mass-like radiation fibrosis in the upper right lung with similar appearance of densely calcified mass in the apex of the right upper lobe. No finding to suggest local recurrence of disease or metastatic disease in the chest, abdomen or pelvis on today's examination. 2. 7 mm subpleural nodule in the periphery of the right lower lobe is stable and favored to be benign. Continued attention on followup studies is recommended to ensure stability. 3. Aortic atherosclerosis, in addition to left anterior descending coronary artery disease. Please note that although the presence of coronary artery calcium documents the presence of coronary artery disease, the severity of this disease and any potential stenosis cannot be assessed on this non-gated CT examination. Assessment for potential risk factor modification, dietary therapy or pharmacologic therapy may be warranted, if clinically indicated. 4. There are calcifications of the mitral valve. Echocardiographic correlation for evaluation of potential valvular dysfunction may be warranted if clinically indicated. 5. Mild diffuse bronchial wall thickening with mild to moderate centrilobular and mild paraseptal emphysema; imaging findings suggestive of underlying COPD. 6. Tip of left subclavian Port-A-Cath is now misdirected into the subclavian vein. 7. Additional incidental findings, as above. Aortic Atherosclerosis (ICD10-I70.0) and Emphysema (ICD10-J43.9).   Electronically Signed   By: Vinnie Langton M.D.   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  Attempted consolidative therapy with Imfinzi on 2 separate occasions, but patient unable to tolerate d/t pneumonitis symptoms requiring several steroid tapers.  -Recent CT chest/abd/pelvis on 07/29/17 revealed no findings for recurrent or metastatic disease. These results were reviewed with her in detail today and she was provided a copy of the radiology report as well.  Next restaging scan will be due in 3-6 months based on NCCN Guidelines; will place orders at subsequent follow-up visit.  -Return to cancer center in 2 months for continued follow-up.   NCCN Guidelines reviewed for continued surveillance for lung cancer:  Surveillance Guidelines for NSCLC:         Pneumonitis symptoms:  -Symptoms improving.   -Now on 20 mg prednisone daily. We will work on tapering her off steroids with the following recommendations (which were also provided to the patient in writing at the conclusion of today's visit):   -Prednisone 20 mg daily through 08/07/17  -08/08/17-08/14/17: Prednisone 10 mg daily   -08/15/17-08/21/17: Prednisone 10 mg every other day  -Then STOP.  -Discussed referral to Pulmonology for optimization of her symptoms. She and her husband are very concerned about finances/insurance coverage right now and would like to wait before referral is placed; this is reasonable and we will continue to offer referral when they are able to afford to go.   Arthalgias/Myalgias:  -Likely multifactorial in a patient with previous arthritis, chronic steroids, and continued recovery from recent chemoradiation.  -Tramadol effective in controlling pain; no refills needed today.  -E-scribed prescription  for Flexeril PRN to her pharmacy, which may be helpful for myalgias/muscle spasms. We discussed possible side effects of this medication and encouraged her to use caution when taking Flexeril given potential for drowsiness/dizziness.   Financial/Insurance concerns:  -Will send a message to Loren Racer, LCSW for her recommendations/additional support for this patient.      Dispo:  -Return to cancer center in 2 months for continued follow-up with labs.    All questions were answered to patient's stated satisfaction. Encouraged patient to call with any new concerns or questions before her next visit to the cancer center and we can certain see her sooner, if needed.      Orders placed this encounter:  Orders Placed This Encounter  Procedures  . CBC with Differential/Platelet  . Comprehensive metabolic panel      Mike Craze, NP [place] [phone]

## 2017-08-11 ENCOUNTER — Other Ambulatory Visit (HOSPITAL_COMMUNITY): Payer: Medicaid Other

## 2017-08-18 ENCOUNTER — Other Ambulatory Visit (HOSPITAL_COMMUNITY): Payer: Self-pay | Admitting: Adult Health

## 2017-08-18 ENCOUNTER — Encounter (HOSPITAL_COMMUNITY): Payer: Self-pay | Admitting: Adult Health

## 2017-08-18 ENCOUNTER — Encounter (HOSPITAL_BASED_OUTPATIENT_CLINIC_OR_DEPARTMENT_OTHER): Payer: Medicaid Other

## 2017-08-18 ENCOUNTER — Encounter (HOSPITAL_COMMUNITY): Payer: Self-pay

## 2017-08-18 VITALS — BP 131/91 | HR 110 | Temp 98.1°F | Resp 20

## 2017-08-18 DIAGNOSIS — Z95828 Presence of other vascular implants and grafts: Secondary | ICD-10-CM

## 2017-08-18 DIAGNOSIS — C3411 Malignant neoplasm of upper lobe, right bronchus or lung: Secondary | ICD-10-CM

## 2017-08-18 DIAGNOSIS — Z452 Encounter for adjustment and management of vascular access device: Secondary | ICD-10-CM

## 2017-08-18 DIAGNOSIS — Z9221 Personal history of antineoplastic chemotherapy: Secondary | ICD-10-CM | POA: Diagnosis not present

## 2017-08-18 LAB — COMPREHENSIVE METABOLIC PANEL
ALBUMIN: 3.6 g/dL (ref 3.5–5.0)
ALT: 19 U/L (ref 14–54)
AST: 15 U/L (ref 15–41)
Alkaline Phosphatase: 80 U/L (ref 38–126)
Anion gap: 9 (ref 5–15)
BUN: 17 mg/dL (ref 6–20)
CHLORIDE: 103 mmol/L (ref 101–111)
CO2: 28 mmol/L (ref 22–32)
CREATININE: 0.95 mg/dL (ref 0.44–1.00)
Calcium: 9.2 mg/dL (ref 8.9–10.3)
GFR calc Af Amer: >60 mL/min (ref >60)
GFR calc non Af Amer: >60 mL/min (ref >60)
GLUCOSE: 93 mg/dL (ref 65–99)
POTASSIUM: 3.6 mmol/L (ref 3.5–5.1)
Sodium: 140 mmol/L (ref 135–145)
Total Bilirubin: 0.3 mg/dL (ref 0.3–1.2)
Total Protein: 7 g/dL (ref 6.5–8.1)

## 2017-08-18 LAB — CBC WITH DIFFERENTIAL/PLATELET
BASOS ABS: 0 10*3/uL (ref 0.0–0.1)
BASOS PCT: 0 %
EOS PCT: 2 %
Eosinophils Absolute: 0.1 10*3/uL (ref 0.0–0.7)
HCT: 37.4 % (ref 36.0–46.0)
Hemoglobin: 12.3 g/dL (ref 12.0–15.0)
Lymphocytes Relative: 11 %
Lymphs Abs: 0.8 10*3/uL (ref 0.7–4.0)
MCH: 31.9 pg (ref 26.0–34.0)
MCHC: 32.9 g/dL (ref 30.0–36.0)
MCV: 96.9 fL (ref 78.0–100.0)
Monocytes Absolute: 0.7 10*3/uL (ref 0.1–1.0)
Monocytes Relative: 9 %
NEUTROS ABS: 5.8 10*3/uL (ref 1.7–7.7)
Neutrophils Relative %: 78 %
PLATELETS: 406 10*3/uL — AB (ref 150–400)
RBC: 3.86 MIL/uL — AB (ref 3.87–5.11)
RDW: 15.5 % (ref 11.5–15.5)
WBC: 7.4 10*3/uL (ref 4.0–10.5)

## 2017-08-18 LAB — TSH: TSH: 3.228 u[IU]/mL (ref 0.350–4.500)

## 2017-08-18 MED ORDER — HEPARIN SOD (PORK) LOCK FLUSH 100 UNIT/ML IV SOLN
INTRAVENOUS | Status: AC
  Filled 2017-08-18: qty 5

## 2017-08-18 MED ORDER — TRAMADOL HCL 50 MG PO TABS: 50.0000 mg | ORAL_TABLET | Freq: Four times a day (QID) | ORAL | 0 refills | Status: DC | PRN

## 2017-08-18 MED ORDER — HEPARIN SOD (PORK) LOCK FLUSH 100 UNIT/ML IV SOLN
500.0000 [IU] | Freq: Once | INTRAVENOUS | Status: AC
  Administered 2017-08-18: 500 [IU] via INTRAVENOUS

## 2017-08-18 MED ORDER — SODIUM CHLORIDE 0.9% FLUSH
10.0000 mL | INTRAVENOUS | Status: DC | PRN
  Administered 2017-08-18: 10 mL via INTRAVENOUS
  Filled 2017-08-18: qty 10

## 2017-08-18 NOTE — Progress Notes (Signed)
Emily Pope presented for Portacath access and flush. Portacath located left chest wall accessed with  H 20 needle. Good blood return present. Portacath flushed with 47ml NS and 500U/42ml Heparin and needle removed intact. Procedure without incident. Patient tolerated procedure well. Patient discharged ambulatory and in stable condition from clinic.  Patient to follow up as scheduled.

## 2017-08-18 NOTE — Progress Notes (Signed)
Patient here for port flush and requesting refill of Tramadol.   [place] Controlled Substance Reporting System reviewed and refill is appropriate on or after 08/18/17. Paper prescription printed & post-dated; Rx left at cancer center front desk for patient to retrieve after showing photo ID per clinic policy.   NCCSRS reviewed:     Mike Craze, NP [place] [phone]

## 2017-08-18 NOTE — Patient Instructions (Signed)
[place] at [facility] Discharge Instructions  RECOMMENDATIONS MADE BY THE CONSULTANT AND ANY TEST RESULTS WILL BE SENT TO YOUR REFERRING PHYSICIAN.  You had your port flushed and lab work drawn today Follow up as scheduled.  Thank you for choosing [place] at [facility] to provide your oncology and hematology care.  To afford each patient quality time with our provider, please arrive at least 15 minutes before your scheduled appointment time.    If you have a lab appointment with the [facility] please come in thru the  Main Entrance and check in at the main information desk  You need to re-schedule your appointment should you arrive 10 or more minutes late.  We strive to give you quality time with our providers, and arriving late affects you and other patients whose appointments are after yours.  Also, if you no show three or more times for appointments you may be dismissed from the clinic at the providers discretion.     Again, thank you for choosing [facility].  Our hope is that these requests will decrease the amount of time that you wait before being seen by our physicians.       _____________________________________________________________  Should you have questions after your visit to [facility], please contact our office at [phone] between the hours of 8:30 a.m. and 4:30 p.m.  Voicemails left after 4:30 p.m. will not be returned until the following business day.  For prescription refill requests, have your pharmacy contact our office.       Resources For Cancer Patients and their Caregivers ? American Cancer Society: Can assist with transportation, wigs, general needs, runs Look Good Feel Better.        951-684-5528 ? Cancer Care: Provides financial assistance, online support groups, medication/co-pay assistance.  1-800-813-HOPE 7797771186) ? [place] Assists  [place] cancer patients and their families through emotional , educational and financial support.  (343) 501-9916 ? [place] DSS Where to apply for food stamps, Medicaid and utility assistance. (606) 622-7221 ? [facility]: Transportation to medical appointments. 6801048273 ? Social Security Administration: May apply for disability if have a Stage IV cancer. 571-879-8275 484 181 0959 ? [employer], Disability and Transit Services: Assists with nutrition, care and transit needs. [place] Support Programs: @10RELATIVEDAYS @ > Cancer Support Group  2nd Tuesday of the month 1pm-2pm, Journey Room  > Creative Journey  3rd Tuesday of the month 1130am-1pm, Journey Room  > Look Good Feel Better  1st Wednesday of the month 10am-12 noon, Journey Room (Call [facility] to register [phone])

## 2017-08-23 ENCOUNTER — Encounter: Payer: Self-pay | Admitting: *Deleted

## 2017-08-23 NOTE — Progress Notes (Signed)
[facility] Clinical Social Work  Clinical Social Work was referred by medical oncology NP for assessment of psychosocial needs due to Medicaid ending in near future. Clinical Social Worker has contacted patient at home several times to offer support and assess for needs. Pt eager to receive additional resources for [facility] insurance options. CSW researched independent insurance providers in [place] and encouraged pt to reach out to them. CSW provided education about out of pocket expenses, deductibles and other things to consider. Pt could possibly tap into other local assistance options based on income, such as food stamps, [place]. She declines these resources currently as not interested in a "hand out" at this time. Pt is very proud and independent, eager to manage this on her own within her current means. Pt denied other needs currently and will contact [place] ins, [place] ins and [place] ins to explore ACA policies. CSW provided pt with education of CSW coverage at [facility] once this CSW leaves [place].     Clinical Social Work interventions: Resource education and referral  Loren Racer, LCSW, OSW-C [place] T[facility]   [phone]

## 2017-09-01 ENCOUNTER — Telehealth (HOSPITAL_COMMUNITY): Payer: Self-pay

## 2017-09-01 DIAGNOSIS — R059 Cough, unspecified: Secondary | ICD-10-CM

## 2017-09-01 DIAGNOSIS — R05 Cough: Secondary | ICD-10-CM

## 2017-09-01 DIAGNOSIS — J189 Pneumonia, unspecified organism: Secondary | ICD-10-CM

## 2017-09-01 NOTE — Addendum Note (Signed)
Addended by: Jaynie Collins R on: 09/01/2017 03:57 PM   Modules accepted: Orders

## 2017-09-01 NOTE — Telephone Encounter (Signed)
Patient called stating she finished her prednisone last week. Once she finished, she started coughing again. Her cough sounds deep. She states sh does cough up some clear mucus. She is using cough drops all the time with little relief. She is requesting something to help with the cough. Told patient I would review with provider and call her back. Prescriptions go to [facility] in [place].

## 2017-09-01 NOTE — Telephone Encounter (Signed)
Reviewed with Elzie Rings, NP. She ordered a special formula of cough medicine for patient that is made at [facility]. Instructed patient she had to pick up prescription here at cancer center and it had to be taken to [facility]. Also Elzie Rings ,NP stated patient would not get any refills on this medicine. She is going to be referred to pulmonologist because the providers do not think her cough is caused by her treatments or her cancer. Explained all this to patient. She verbalized understanding. Referral to pulmonologist entered also.

## 2017-09-06 ENCOUNTER — Telehealth (HOSPITAL_COMMUNITY): Payer: Self-pay

## 2017-09-06 ENCOUNTER — Encounter (HOSPITAL_COMMUNITY): Payer: Self-pay | Admitting: Lab

## 2017-09-06 NOTE — Telephone Encounter (Signed)
Patient called stating she did not fill the cough medicine Gretchen prescribed for her last week. She states she is allergic to Codeine but was going to try it. Also she states the staff at [facility] treated her poorly. patient states she will just keep using cough drops.She also states she cannot go to [place] to the pulmonologist. I asked if a gas card would help because we would be glad to give her one to get to her appointment. She states her "car cannot make it". Explained to patient I would let Gretchen and scheduling know. She should get a call concerning her appointment with pulmonology this week.

## 2017-09-06 NOTE — Progress Notes (Unsigned)
Referral to [facility]. Records faxed on 9/11

## 2017-09-16 ENCOUNTER — Other Ambulatory Visit (HOSPITAL_COMMUNITY): Payer: Self-pay | Admitting: Respiratory Therapy

## 2017-09-16 DIAGNOSIS — J441 Chronic obstructive pulmonary disease with (acute) exacerbation: Secondary | ICD-10-CM

## 2017-09-19 ENCOUNTER — Ambulatory Visit (HOSPITAL_COMMUNITY): Payer: Medicaid Other

## 2017-09-23 ENCOUNTER — Ambulatory Visit (HOSPITAL_COMMUNITY): Payer: Medicaid Other

## 2017-09-23 ENCOUNTER — Encounter (HOSPITAL_COMMUNITY): Payer: Medicaid Other

## 2017-09-28 ENCOUNTER — Ambulatory Visit (HOSPITAL_COMMUNITY)
Admission: RE | Admit: 2017-09-28 | Discharge: 2017-09-28 | Disposition: A | Discharge status: Home / Self Care | Payer: BLUE CROSS/BLUE SHIELD | Source: Ambulatory Visit | Attending: Pulmonary Disease | Admitting: Pulmonary Disease

## 2017-09-28 DIAGNOSIS — J441 Chronic obstructive pulmonary disease with (acute) exacerbation: Secondary | ICD-10-CM | POA: Diagnosis present

## 2017-09-28 MED ORDER — ALBUTEROL SULFATE (2.5 MG/3ML) 0.083% IN NEBU
2.5000 mg | INHALATION_SOLUTION | Freq: Once | RESPIRATORY_TRACT | Status: AC
Start: 2017-09-28 — End: 2017-09-28
  Administered 2017-09-28: 2.5 mg via RESPIRATORY_TRACT

## 2017-10-03 LAB — PULMONARY FUNCTION TEST
DL/VA % PRED: 68 %
DL/VA: 2.92 ml/min/mmHg/L
DLCO unc % pred: 55 %
DLCO unc: 10.56 ml/min/mmHg
FEF 25-75 POST: 1.22 L/s
FEF 25-75 Pre: 1.97 L/sec
FEF2575-%CHANGE-POST: -38 %
FEF2575-%Pred-Post: 57 %
FEF2575-%Pred-Pre: 94 %
FEV1-%Change-Post: -18 %
FEV1-%PRED-PRE: 82 %
FEV1-%Pred-Post: 67 %
FEV1-PRE: 1.79 L
FEV1-Post: 1.46 L
FEV1FVC-%CHANGE-POST: -10 %
FEV1FVC-%Pred-Pre: 113 %
FEV6-%Change-Post: -9 %
FEV6-%PRED-PRE: 74 %
FEV6-%Pred-Post: 67 %
FEV6-PRE: 2.02 L
FEV6-Post: 1.83 L
FEV6FVC-%Pred-Post: 104 %
FEV6FVC-%Pred-Pre: 104 %
FVC-%CHANGE-POST: -9 %
FVC-%PRED-PRE: 71 %
FVC-%Pred-Post: 65 %
FVC-PRE: 2.02 L
FVC-Post: 1.83 L
POST FEV6/FVC RATIO: 100 %
PRE FEV1/FVC RATIO: 89 %
Post FEV1/FVC ratio: 80 %
Pre FEV6/FVC Ratio: 100 %
RV % pred: 109 %
RV: 1.98 L
TLC % PRED: 82 %
TLC: 3.69 L

## 2017-10-28 ENCOUNTER — Encounter (HOSPITAL_COMMUNITY): Payer: BLUE CROSS/BLUE SHIELD

## 2017-10-28 ENCOUNTER — Encounter (HOSPITAL_COMMUNITY): Payer: BLUE CROSS/BLUE SHIELD | Attending: Hematology & Oncology | Admitting: Oncology

## 2017-10-28 ENCOUNTER — Encounter (HOSPITAL_COMMUNITY): Payer: Self-pay | Admitting: Oncology

## 2017-10-28 VITALS — BP 156/81 | HR 109 | Temp 98.2°F | Resp 20 | Wt 129.7 lb

## 2017-10-28 DIAGNOSIS — Z95828 Presence of other vascular implants and grafts: Secondary | ICD-10-CM

## 2017-10-28 DIAGNOSIS — T66XXXA Radiation sickness, unspecified, initial encounter: Secondary | ICD-10-CM

## 2017-10-28 DIAGNOSIS — K219 Gastro-esophageal reflux disease without esophagitis: Secondary | ICD-10-CM | POA: Diagnosis not present

## 2017-10-28 DIAGNOSIS — C3411 Malignant neoplasm of upper lobe, right bronchus or lung: Secondary | ICD-10-CM | POA: Insufficient documentation

## 2017-10-28 DIAGNOSIS — J189 Pneumonia, unspecified organism: Secondary | ICD-10-CM | POA: Diagnosis not present

## 2017-10-28 DIAGNOSIS — C3490 Malignant neoplasm of unspecified part of unspecified bronchus or lung: Secondary | ICD-10-CM

## 2017-10-28 DIAGNOSIS — M1990 Unspecified osteoarthritis, unspecified site: Secondary | ICD-10-CM | POA: Diagnosis not present

## 2017-10-28 DIAGNOSIS — K208 Other esophagitis without bleeding: Secondary | ICD-10-CM

## 2017-10-28 DIAGNOSIS — Z9221 Personal history of antineoplastic chemotherapy: Secondary | ICD-10-CM | POA: Insufficient documentation

## 2017-10-28 MED ORDER — HEPARIN SOD (PORK) LOCK FLUSH 100 UNIT/ML IV SOLN
500.0000 [IU] | Freq: Once | INTRAVENOUS | Status: AC
  Administered 2017-10-28: 500 [IU] via INTRAVENOUS

## 2017-10-28 MED ORDER — SODIUM CHLORIDE 0.9% FLUSH
10.0000 mL | INTRAVENOUS | Status: DC | PRN
  Administered 2017-10-28: 10 mL via INTRAVENOUS
  Filled 2017-10-28: qty 10

## 2017-10-28 NOTE — Progress Notes (Signed)
Emily Pope presented for Portacath access and flush. Portacath located left chest wall accessed with  H 20 needle. Good blood return present. Portacath flushed with 38ml NS and 500U/9ml Heparin and needle removed intact. Procedure without incident. Patient tolerated procedure well. Patient tolerated treatment without incidence. Patient discharged ambulatory and in stable condition from clinic. Patient to follow up as scheduled.

## 2017-10-28 NOTE — Progress Notes (Signed)
Emily Chroman, MD [place] [address]  Primary cancer of right upper lobe of lung ([place]) - Plan: CBC with Differential, Comprehensive metabolic panel  Pneumonitis  Radiation-induced esophagitis  Malignant neoplasm of unspecified part of unspecified bronchus or lung ([place]) - Plan: CT Chest Wo Contrast  Gastroesophageal reflux disease without esophagitis  Port-A-Cath in place - Plan: Schedule Portacath Flush Appointment, heparin lock flush 100 unit/mL, sodium chloride flush (NS) 0.9 % injection 10 mL  Arthritis pain  CURRENT THERAPY: Surveillance in accordance with NCCN guidelines  INTERVAL HISTORY: Emily Pope 61 y.o. female returns for followup of NSCLC of RUL.  From an oncology perspective, she remains in remission based upon most recent imaging.  She continues to have questionable immune related toxicities to her Imfinzi therapy.  She is seeing Dr. Luan Pulling for her pulmonary issues.  She definitely has underlying COPD but did receive chemotherapy and radiation plus immunotherapy which could cause pneumonitis.  She is currently on a taper of prednisone by Dr. Luan Pulling and recently started trelegy inhaler.  She reports improvement in her pulmonary complaints.  Her cough is significantly improved.  Her shortness of breath is improved as well.  She continues to have dyspnea on exertion.  She rates her appetite at 75%.  Energy levels at 50%.  Interestingly, she reports an increase in her arthritic/joint pain when she is not on prednisone which makes me think that she has an inflammatory arthritis.  I wonder if this is immunotherapy related as well.  If so, management is symptomatic/steroids.  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  S/P concomitant chemoradiation with carboplatin/paclitaxel (09/20/2016- 10/25/2016) followed by 2 cycle of consolidative chemotherapy with carboplatin/paclitaxel (11/15/2016- 12/06/2016).  Unable to tolerate consolidative durvalumab (Imfinzi) beginning on 01/26/2016 and stopped 05/2017.   AND Imfinzi-induced pneumonitis, grade 2, resulting in holding Imfinzi from 04/25/2017- 05/09/2017 with corticosteroids treatment with taper.  This resolved/improved with steroids. Developed pnemonitis again after rechallenge with imfinzi. On steroid taper again.       Primary cancer of right upper lobe of lung ([place])   08/04/2016 PET scan    PET IMPRESSION: 1. 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 Mass is confluent with the right superior hilum and right lower tracheal wall, suggestive of a T4 primary tumor. 2. Postobstructive pneumonia in the right upper lobe. 3. Hypermetabolic ipsilateral and contralateral mediastinal and contralateral hilar lymphadenopathy, suggestive of N3 nodal disease.      08/24/2016 Procedure    Bronchoscopy      08/25/2016 Pathology Results    Diagnosis Endobronchial biopsy, RUL - POORLY DIFFERENTIATED NON-SMALL CELL CARCINOMA. The immunophenotype is consistent with poorly differentiated adenocarcinoma.      09/10/2016 Imaging    MRI brain- No metastatic disease or acute intracranial abnormality.      09/20/2016 - 10/25/2016 Chemotherapy    Carboplatin/Paclitaxel weekly  x 6 with  XRT.      09/20/2016 - 10/25/2016 Radiation Therapy         11/15/2016 - 12/06/2016 Chemotherapy    Carboplatin/paclitaxel every 21 days x 2 cycles.      12/30/2016 Imaging    CT CAP- 1. Central right upper lobe lung mass has mildly decreased since 11/21/2016 chest CT angiogram and significantly decreased since 08/04/2016 PET-CT. 2. No pathologically enlarged thoracic lymph nodes. Previously described hypermetabolic thoracic adenopathy on the 08/04/2016 PET-CT has decreased in size. 3. Previously described bilateral lower lobe pulmonary nodules are stable in size. 4. New solitary subsolid 4 mm right middle lobe pulmonary nodule is nonspecific, and may be inflammatory. Recommend attention on a follow-up chest CT in 3 months. 5. No definite metastatic disease in the abdomen. Tiny sub 5 mm low-attenuation lesions in the liver are too small to characterize, favor benign. 6. Aortic atherosclerosis. One vessel coronary atherosclerosis. 7. Moderate emphysema.      01/25/2017 -  Chemotherapy    Durvalumab (Imfinzi) x 52 weeks.       03/31/2017 Imaging    CT chest with contrast: Stable size of partially calcified mass in the posterior right upper lobe.  Increased airspace disease in paramediastinal right upper and superior right lower lobes. Differential diagnosis includes radiation pneumonitis, drug reaction, and infection.  Stable sub-cm right lower lobe pulmonary nodule.  No evidence of lymphadenopathy or pleural effusion.  Emphysema.  Aortic and coronary artery atherosclerosis.      04/25/2017 Adverse Reaction    Progressive cough, concerning for pneumonitis.      04/25/2017 Treatment Plan Change    Progressive cough concerning for pneumonitis.  Treatment held.  Managed by steroids.      05/09/2017 Treatment Plan Change    Restart Imfinzi with improvement of cough, Grade 1.      05/27/2017 Imaging    CT CHEST WITH CONTRAST IMPRESSION: 1. Similar size of  partially calcified posterior right apical lung mass. 2. Similar to minimal increase in surrounding radiation fibrosis. 3. Subtle areas of mosaic attenuation are greater on the right. Likely attributed to mild ground-glass opacity. In the appropriate clinical setting, this could represent drug toxicity induced pneumonitis. 4.  Coronary artery atherosclerosis. Aortic atherosclerosis. 5. Similar right lower lobe pulmonary nodule.      05/27/2017 Adverse Reaction    Developed worsening cough and SOB again after imfinzi. Grade 2. Placed on long steroid taper.  Permanently discontinue any further imfinzi treatments.      07/29/2017 Imaging    CT chest/abd/pelvis: IMPRESSION: 1. Stable post treatment related changes of mass-like radiation fibrosis in the upper right lung with similar appearance of densely calcified mass in the apex of the right upper lobe. No finding to suggest local recurrence of disease or metastatic disease in the chest, abdomen or pelvis on today's examination. 2. 7 mm subpleural nodule in the periphery of the right lower lobe is stable and favored to be benign. Continued attention on followup studies is recommended to ensure stability. 3. Aortic atherosclerosis, in addition to left anterior descending coronary artery disease. Please note that although the presence of coronary artery calcium documents the presence of coronary artery disease, the severity of this disease and any potential stenosis cannot be assessed on this non-gated CT examination. Assessment for potential risk factor modification, dietary therapy or pharmacologic therapy may be warranted, if clinically indicated. 4. There are calcifications of the mitral valve. Echocardiographic correlation for evaluation of potential valvular dysfunction may be warranted  if clinically indicated. 5. Mild diffuse bronchial wall thickening with mild to moderate centrilobular and mild paraseptal emphysema; imaging  findings suggestive of underlying COPD. 6. Tip of left subclavian Port-A-Cath is now misdirected into the subclavian vein.        Review of Systems  Constitutional: Positive for malaise/fatigue. Negative for chills, fever and weight loss.  HENT: Negative.   Eyes: Negative.   Respiratory: Positive for shortness of breath. Negative for cough.   Cardiovascular: Negative.  Negative for chest pain.  Gastrointestinal: Negative.  Negative for blood in stool, constipation, diarrhea, melena, nausea and vomiting.  Genitourinary: Negative.   Musculoskeletal: Negative.   Skin: Negative.   Neurological: Positive for dizziness and weakness.  Endo/Heme/Allergies: Bruises/bleeds easily.  Psychiatric/Behavioral: Negative.     Past Medical History:  Diagnosis Date  . Anxiety    uses it for menopause   . Arthritis   . Arthritis pain 10/28/2017  . Cancer ([place])    Right Lung  . Chronic bronchitis ([place])   . COPD (chronic obstructive pulmonary disease) ([employer])   . GERD (gastroesophageal reflux disease)   . Headache    sinus  . Hypertension   . Pneumonia    07/22/16-had pneumonia in right lung and found mass in lung  . Pneumonitis 05/09/2017  . Primary cancer of right upper lobe of lung ([place]) 08/23/2016    Past Surgical History:  Procedure Laterality Date  . ABDOMINAL HYSTERECTOMY    . APPENDECTOMY     when had hysterectomy  . CARPAL TUNNEL RELEASE Bilateral   . ENDOBRONCHIAL ULTRASOUND Bilateral 08/23/2016   Procedure: ENDOBRONCHIAL ULTRASOUND;  Surgeon: Collene Gobble, MD;  Location: [facility];  Service: Cardiopulmonary;  Laterality: Bilateral;  . HERNIA REPAIR     left inguinal hernia age 35  . PORTACATH PLACEMENT Left 09/17/2016   Procedure: INSERTION PORT-A-CATH LEFT SUBCLAVIAN;  Surgeon: Aviva Signs, MD;  Location: [facility];  Service: General;  Laterality: Left;  . TUBAL LIGATION      Family History  Problem Relation Age of Onset  . Cancer Mother        lung  . Hypertension  Mother   . Hypertension Father   . Diabetes Father   . Hypertension Sister   . Cancer Brother        lung  . Hypertension Sister   . Colon cancer Neg Hx     Social History   Social History  . Marital status: Divorced    Spouse name: N/A  . Number of children: N/A  . Years of education: N/A   Social History Main Topics  . Smoking status: Former Smoker    Packs/day: 2.00    Years: 43.00    Quit date: 07/20/2016  . Smokeless tobacco: Never Used  . Alcohol use No  . Drug use: No  . Sexual activity: Not Asked   Other Topics Concern  . None   Social History Narrative  . None     PHYSICAL EXAMINATION  ECOG PERFORMANCE STATUS: 1 - Symptomatic but completely ambulatory  Vitals:   10/28/17 1047  BP: (!) 156/81  Pulse: (!) 109  Resp: 20  Temp: 98.2 F (36.8 C)  SpO2: 99%    GENERAL:alert, no distress, well nourished, well developed, comfortable, cooperative, smiling and unaccompanied SKIN: skin color, texture, turgor are normal, no rashes or significant lesions HEAD: Normocephalic, No masses, lesions, tenderness or abnormalities EYES: normal, EOMI EARS: External ears normal OROPHARYNX:lips, buccal mucosa, and tongue normal  NECK: supple, no adenopathy,  trachea midline LYMPH:  no palpable lymphadenopathy BREAST:not examined LUNGS: clear to auscultation and percussion, good air movement on auscultation. HEART: regular rate & rhythm, no murmurs, no gallops, S1 normal and S2 normal ABDOMEN:abdomen soft, non-tender and normal bowel sounds BACK: Back symmetric, no curvature. EXTREMITIES:less then 2 second capillary refill, no joint deformities, effusion, or inflammation, no edema, no skin discoloration, no cyanosis  NEURO: alert & oriented x 3 with fluent speech, no focal motor/sensory deficits, gait normal   LABORATORY DATA: CBC    Component Value Date/Time   WBC 7.4 08/18/2017 1011   RBC 3.86 (L) 08/18/2017 1011   HGB 12.3 08/18/2017 1011   HGB 12.9 09/02/2016  1403   HCT 37.4 08/18/2017 1011   HCT 38.4 09/02/2016 1403   PLT 406 (H) 08/18/2017 1011   PLT 476 (H) 09/02/2016 1403   MCV 96.9 08/18/2017 1011   MCV 94.6 09/02/2016 1403   MCH 31.9 08/18/2017 1011   MCHC 32.9 08/18/2017 1011   RDW 15.5 08/18/2017 1011   RDW 13.6 09/02/2016 1403   LYMPHSABS 0.8 08/18/2017 1011   LYMPHSABS 2.5 09/02/2016 1403   MONOABS 0.7 08/18/2017 1011   MONOABS 0.7 09/02/2016 1403   EOSABS 0.1 08/18/2017 1011   EOSABS 0.7 (H) 09/02/2016 1403   BASOSABS 0.0 08/18/2017 1011   BASOSABS 0.1 09/02/2016 1403      Chemistry      Component Value Date/Time   NA 140 08/18/2017 1011   NA 140 09/02/2016 1403   K 3.6 08/18/2017 1011   K 4.2 09/02/2016 1403   CL 103 08/18/2017 1011   CO2 28 08/18/2017 1011   CO2 27 09/02/2016 1403   BUN 17 08/18/2017 1011   BUN 15.6 09/02/2016 1403   CREATININE 0.95 08/18/2017 1011   CREATININE 0.8 09/02/2016 1403      Component Value Date/Time   CALCIUM 9.2 08/18/2017 1011   CALCIUM 9.6 09/02/2016 1403   ALKPHOS 80 08/18/2017 1011   ALKPHOS 159 (H) 09/02/2016 1403   AST 15 08/18/2017 1011   AST 12 09/02/2016 1403   ALT 19 08/18/2017 1011   ALT <9 09/02/2016 1403   BILITOT 0.3 08/18/2017 1011   BILITOT <0.30 09/02/2016 1403        PENDING LABS:   RADIOGRAPHIC STUDIES:  No results found.   PATHOLOGY:    ASSESSMENT AND PLAN:  Primary cancer of right upper lobe of lung (H[facility]) Stage IIIB NSCLC, adenocarcinoma type, of RUL, initially diagnosed on 08/25/2016.  She is status post concomitant chemoradiation with weekly carboplatin/paclitaxel x6 weekly cycles (09/20/2016-10/25/2016) followed by 2 cycles of consolidative carboplatin/paclitaxel finishing on 12/06/2016.  She then underwent consolidative Imfinzi therapy, complicated by pneumonitis, resulting in early discontinuation with the last dose being on 05/09/2017.  Restaging CT CAP on 07/29/2017 demonstrates remission of disease.  Labs today: CBC diff, CMET.  I  personally reviewed and went over laboratory results with the patient.  The results are noted within this dictation.  Labs in 3 months: CBC diff, CMET.  Restaging CT Chest wo contrast in 3 months.  She was seen by Rad Onc on 10/06/2017 and note is reviewed.  From an XRT standpoint, she is stable.  She will see them back in ~April/May 2019.  Return in 3 months for follow-up.  Recommend pulmonary referral.  2. 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  She was treated with prolonged steroid with taper (repeatedly).  Currently following with Dr. Luan Pulling.  S/P PFT  testing in October 2018.  Currently on Trelegy (inhaler), managed by Dr. Luan Pulling.  She is currently nearing the completion of her prednisone, prescribed by Dr. Luan Pulling.  3. Radiation-induced esophagitis Improved/resolved.  4. GERD Followed by GI- on Nexium.  5. 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  She notes that it started following initiation of immunotherapy.  She reports resolution when she on Prednisone, and recurrence when off anti-inflammatory medication.  She is back on Prednisone taper by Dr. Luan Pulling.    Final Result of Complexity      Choose decision making level with 2 or 3 checks OR choose the decision making level on Section B       A Number of diagnoses or treatment options  []   </= 1 Minimal  []   2 Limited  [x]   3 Multiple  []   >/= 4 Extensive  B Amount and complexity of data  []   </= 1 Minimal or low  []   2 Limited  [x]   3  Moderate  []   >/= 4 Extensive  C Highest risk  []   Minimal  []   Low  [x]   Moderate  []   High   Type of decision making  []   Straight-forward  []   Low Complexity  [x]   Moderate- Complexity  []   High- Complexity     ORDERS PLACED FOR THIS ENCOUNTER: Orders Placed This Encounter  Procedures  . CT Chest Wo Contrast  . CBC with Differential  . Comprehensive metabolic panel    . Schedule Portacath Flush Appointment    MEDICATIONS PRESCRIBED THIS ENCOUNTER: Meds ordered this encounter  Medications  . heparin lock flush 100 unit/mL  . sodium chloride flush (NS) 0.9 % injection 10 mL  . TRELEGY ELLIPTA 100-62.5-25 MCG/INH AEPB    Sig: Take 2 puffs by mouth daily.    Refill:  0  . predniSONE (DELTASONE) 10 MG tablet    Refill:  0    THERAPY PLAN:  NCCN guidelines for Non-Small Cell Lung Cancer Surveillance in the setting of clinical/radiographic remission are as follows (4.2018):  A. Stage I-II (primary treatment included surgery +/- chemotherapy):   1. H+P and chest CT +/- contrast every 6 months for 2-3 years, then H+P and low-dose non-contrast-enhanced chest CT annually  B. Stage I-II (primary treatment included RT) or Stage III or Stage IV (oligometastatic with all sites treated with definitive intent)   1. H+P and chest CT +/- contrast every 3-6 months for 3 years, then H+P and chest CT +/- contrast every 6 months for 2 years, then H+P and low-dose non-contrast-enhanced chest CT annually    A. Residual or new radiographic abnormalities may require more frequent imaging  C. Smoking cessation advice, counseling, and pharmacotherapy  D. PET/CT or Brain MRI is not routinely indicated.   All questions were answered. The patient knows to call the clinic with any problems, questions or concerns. We can certainly see the patient much sooner if necessary.  Patient and plan discussed with Dr. Twana First and she is in agreement with the aforementioned.   This note is electronically signed by: Doy Mince 10/28/2017 11:52 AM

## 2017-10-28 NOTE — Patient Instructions (Addendum)
[place] at [facility] Discharge Instructions  RECOMMENDATIONS MADE BY THE CONSULTANT AND ANY TEST RESULTS WILL BE SENT TO YOUR REFERRING PHYSICIAN.  Labs completed today. Labs in 3 months. CT chest in 3 months. Return in 3 months for follow-up. Continue follow-up with Dr. Luan Pulling as directed.  Thank you for choosing [place] [facility] at [facility] to provide your oncology and hematology care.  To afford each patient quality time with our provider, please arrive at least 15 minutes before your scheduled appointment time.    If you have a lab appointment with the [facility] please come in thru the  Main Entrance and check in at the main information desk  You need to re-schedule your appointment should you arrive 10 or more minutes late.  We strive to give you quality time with our providers, and arriving late affects you and other patients whose appointments are after yours.  Also, if you no show three or more times for appointments you may be dismissed from the clinic at the providers discretion.     Again, thank you for choosing [facility].  Our hope is that these requests will decrease the amount of time that you wait before being seen by our physicians.       _____________________________________________________________  Should you have questions after your visit to [facility], please contact our office at [phone] between the hours of 8:30 a.m. and 4:30 p.m.  Voicemails left after 4:30 p.m. will not be returned until the following business day.  For prescription refill requests, have your pharmacy contact our office.       Resources For Cancer Patients and their Caregivers ? American Cancer Society: Can assist with transportation, wigs, general needs, runs Look Good Feel Better.        670-553-9719 ? Cancer Care: Provides financial assistance, online support groups, medication/co-pay assistance.   1-800-813-HOPE (217) 506-9903) ? [place] Assists [place] cancer patients and their families through emotional , educational and financial support.  (757)677-3467 ? [place] DSS Where to apply for food stamps, Medicaid and utility assistance. 9100468226 ? [employer]: Transportation to medical appointments. 5181880535 ? Social Security Administration: May apply for disability if have a Stage IV cancer. 6788889324 (406) 768-8264 ? [employer], Disability and Transit Services: Assists with nutrition, care and transit needs. [place] Support Programs: @10RELATIVEDAYS @ > Cancer Support Group  2nd Tuesday of the month 1pm-2pm, Journey Room  > Creative Journey  3rd Tuesday of the month 1130am-1pm, Journey Room  > Look Good Feel Better  1st Wednesday of the month 10am-12 noon, Journey Room (Call [place] to register [phone])

## 2017-10-28 NOTE — Patient Instructions (Signed)
[place] at [facility] Discharge Instructions  RECOMMENDATIONS MADE BY THE CONSULTANT AND ANY TEST RESULTS WILL BE SENT TO YOUR REFERRING PHYSICIAN.  You had your port flushed today Continue to get it flushed every 8 weeks  Thank you for choosing [place] at [facility] to provide your oncology and hematology care.  To afford each patient quality time with our provider, please arrive at least 15 minutes before your scheduled appointment time.    If you have a lab appointment with the [facility] please come in thru the  Main Entrance and check in at the main information desk  You need to re-schedule your appointment should you arrive 10 or more minutes late.  We strive to give you quality time with our providers, and arriving late affects you and other patients whose appointments are after yours.  Also, if you no show three or more times for appointments you may be dismissed from the clinic at the providers discretion.     Again, thank you for choosing [facility].  Our hope is that these requests will decrease the amount of time that you wait before being seen by our physicians.       _____________________________________________________________  Should you have questions after your visit to [facility], please contact our office at [phone] between the hours of 8:30 a.m. and 4:30 p.m.  Voicemails left after 4:30 p.m. will not be returned until the following business day.  For prescription refill requests, have your pharmacy contact our office.       Resources For Cancer Patients and their Caregivers ? American Cancer Society: Can assist with transportation, wigs, general needs, runs Look Good Feel Better.        (613) 343-3794 ? Cancer Care: Provides financial assistance, online support groups, medication/co-pay assistance.  1-800-813-HOPE 252-524-9621) ? [place] Assists  [place] cancer patients and their families through emotional , educational and financial support.  787 603 9619 ? [place] DSS Where to apply for food stamps, Medicaid and utility assistance. (215) 015-7599 ? [employer]: Transportation to medical appointments. (531) 218-8278 ? Social Security Administration: May apply for disability if have a Stage IV cancer. 7808163043 734-777-1473 ? [employer], Disability and Transit Services: Assists with nutrition, care and transit needs. [place] Support Programs: @10RELATIVEDAYS @ > Cancer Support Group  2nd Tuesday of the month 1pm-2pm, Journey Room  > Creative Journey  3rd Tuesday of the month 1130am-1pm, Journey Room  > Look Good Feel Better  1st Wednesday of the month 10am-12 noon, Journey Room (Call [place] to register [phone])

## 2017-10-28 NOTE — Assessment & Plan Note (Addendum)
Stage IIIB NSCLC, adenocarcinoma type, of RUL, initially diagnosed on 08/25/2016.  She is status post concomitant chemoradiation with weekly carboplatin/paclitaxel x6 weekly cycles (09/20/2016-10/25/2016) followed by 2 cycles of consolidative carboplatin/paclitaxel finishing on 12/06/2016.  She then underwent consolidative Imfinzi therapy, complicated by pneumonitis, resulting in early discontinuation with the last dose being on 05/09/2017.  Restaging CT CAP on 07/29/2017 demonstrates remission of disease.  Labs today: CBC diff, CMET.  I personally reviewed and went over laboratory results with the patient.  The results are noted within this dictation.  Labs in 3 months: CBC diff, CMET.  Restaging CT Chest wo contrast in 3 months.  She was seen by Rad Onc on 10/06/2017 and note is reviewed.  From an XRT standpoint, she is stable.  She will see them back in ~April/May 2019.  Return in 3 months for follow-up.  Recommend pulmonary referral.

## 2017-12-02 ENCOUNTER — Other Ambulatory Visit (HOSPITAL_COMMUNITY): Payer: Self-pay | Admitting: Adult Health

## 2017-12-02 ENCOUNTER — Encounter (HOSPITAL_COMMUNITY): Payer: Self-pay | Admitting: Adult Health

## 2017-12-02 DIAGNOSIS — C3411 Malignant neoplasm of upper lobe, right bronchus or lung: Secondary | ICD-10-CM

## 2017-12-02 NOTE — Progress Notes (Signed)
Received refill request from pharmacy for Tramadol.   [place] Controlled Substance Reporting System reviewed and refill is appropriate on or after 12/02/17. Paper prescription printed & post-dated. Will ask nursing to fax prescription, if possible. Otherwise, patient will need to come to cancer center to pick up paper prescription after showing photo ID per clinic policy.    NCCSRS reviewed:     Mike Craze, NP [place] [phone]

## 2017-12-08 ENCOUNTER — Telehealth (HOSPITAL_COMMUNITY): Payer: Self-pay

## 2017-12-08 NOTE — Telephone Encounter (Addendum)
Patient called and stated she has been off her prednisone for 2 weeks. About 3 weeks ago, she started getting the dry hacky, cough back. She is having joint pain, nausea and is weak. Zofran is controlling the nausea. She missed her appointment with Dr. Luan Pulling yesterday because of the weather. Reviewed with provider. Patient has not been treated in months at [facility]. At her last appt she was referred to pulmonology. Explained to patient that since Dr. Luan Pulling ordered the prednisone taper than she needs to call him about her symptoms. Patient verbalized understanding and states she will call Dr. Luan Pulling.

## 2017-12-16 ENCOUNTER — Encounter (HOSPITAL_COMMUNITY): Payer: BLUE CROSS/BLUE SHIELD | Attending: Hematology & Oncology

## 2017-12-16 ENCOUNTER — Encounter (HOSPITAL_COMMUNITY): Payer: Self-pay

## 2017-12-16 VITALS — BP 144/95 | HR 100 | Temp 98.2°F | Resp 20

## 2017-12-16 DIAGNOSIS — Z9221 Personal history of antineoplastic chemotherapy: Secondary | ICD-10-CM | POA: Insufficient documentation

## 2017-12-16 DIAGNOSIS — Z452 Encounter for adjustment and management of vascular access device: Secondary | ICD-10-CM

## 2017-12-16 DIAGNOSIS — C3411 Malignant neoplasm of upper lobe, right bronchus or lung: Secondary | ICD-10-CM | POA: Insufficient documentation

## 2017-12-16 MED ORDER — HEPARIN SOD (PORK) LOCK FLUSH 100 UNIT/ML IV SOLN
500.0000 [IU] | Freq: Once | INTRAVENOUS | Status: AC
  Administered 2017-12-16: 500 [IU] via INTRAVENOUS

## 2017-12-16 MED ORDER — SODIUM CHLORIDE 0.9% FLUSH
10.0000 mL | Freq: Once | INTRAVENOUS | Status: AC
  Administered 2017-12-16: 10 mL via INTRAVENOUS

## 2017-12-16 MED ORDER — HEPARIN SOD (PORK) LOCK FLUSH 100 UNIT/ML IV SOLN
INTRAVENOUS | Status: AC
  Filled 2017-12-16: qty 5

## 2017-12-16 NOTE — Progress Notes (Signed)
To treatment room for port flush per protocol.  Port site clean and dry with no bruising or swelling noted at site.  Band aid applied.  VSS with discharge and left ambulatory with no s/s of distress noted.

## 2017-12-16 NOTE — Patient Instructions (Signed)
[facility] at [facility]  Discharge Instructions:  Your port was flushed today. _______________________________________________________________  Thank you for choosing [facility] at [facility] to provide your oncology and hematology care.  To afford each patient quality time with our providers, please arrive at least 15 minutes before your scheduled appointment.  You need to re-schedule your appointment if you arrive 10 or more minutes late.  We strive to give you quality time with our providers, and arriving late affects you and other patients whose appointments are after yours.  Also, if you no show three or more times for appointments you may be dismissed from the clinic.  Again, thank you for choosing [facility] at [facility]. Our hope is that these requests will allow you access to exceptional care and in a timely manner. _______________________________________________________________  If you have questions after your visit, please contact our office at [phone] between the hours of 8:30 a.m. and 5:00 p.m. Voicemails left after 4:30 p.m. will not be returned until the following business day. _______________________________________________________________  For prescription refill requests, have your pharmacy contact our office. _______________________________________________________________  Recommendations made by the consultant and any test results will be sent to your referring physician. _______________________________________________________________ 

## 2017-12-22 IMAGING — CT NM PET TUM IMG INITIAL (PI) SKULL BASE T - THIGH
9 series · 19 of 25 positions shown · non-contrast
Comparison: 07/21/2016 chest radiograph. No prior chest CT is
available at the time this dictation.

CLINICAL DATA: Initial treatment strategy for right hilar mass. A
right upper lobe opacity with volume loss was seen on recent chest
radiograph.

EXAM:
NUCLEAR MEDICINE PET SKULL BASE TO THIGH
TECHNIQUE: 6.1 mCi F-18 FDG was injected intravenously. Full-ring PET imaging
was performed from the skull base to thigh after the radiotracer. CT
data was obtained and used for attenuation correction and anatomic
localization.
FASTING BLOOD GLUCOSE:  Value: 103 mg/dl

[Series 3: pet sk_thigh ac · axial · 5.0mm · 4.07mm/px · z∈[-660,+128]mm · 3 of 198 slices shown]
[im 1/198]
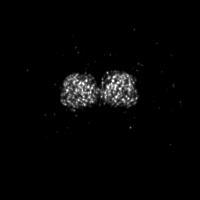
[im 66/198]
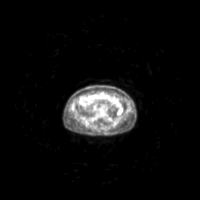
[im 198/198]
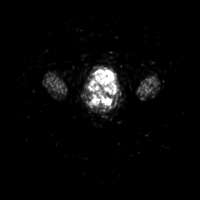

[Series 4: ct sk_thigh 5.0 b31f · axial · 5.0mm · 0.84mm/px · z∈[-660,+128]mm · 3 of 198 slices shown]
[im 1/198]
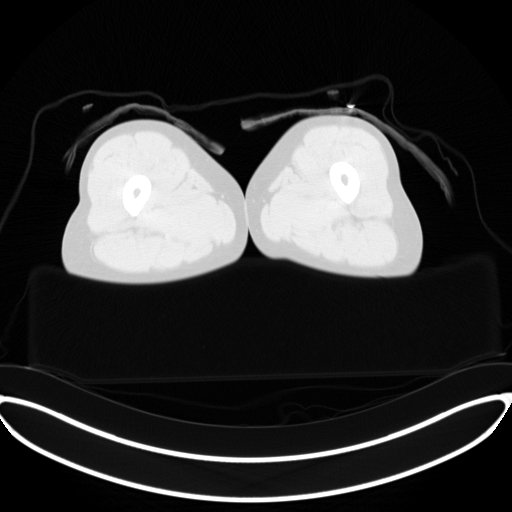
[im 66/198]
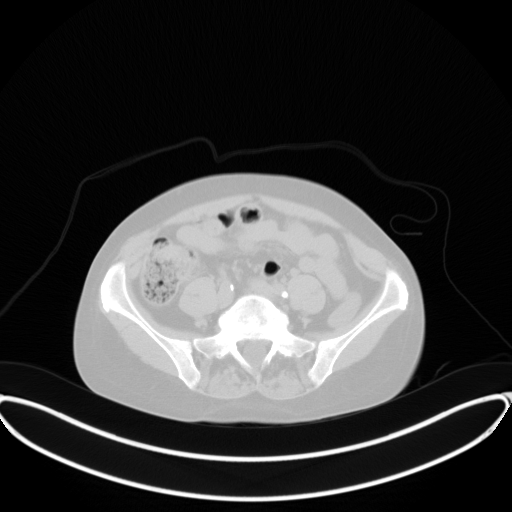
[im 198/198  brain]
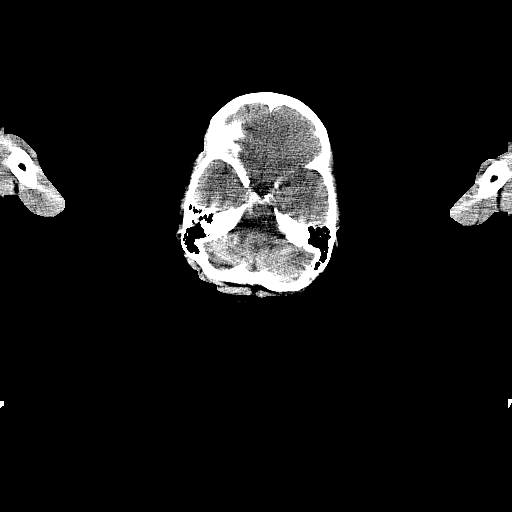

[Series 7: pet sk_thigh nac · axial · 5.0mm · 4.07mm/px · z∈[-660,+128]mm · 4 of 198 slices shown]
[im 1/198]
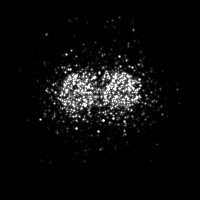
[im 50/198]
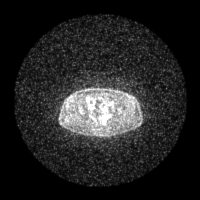
[im 148/198]
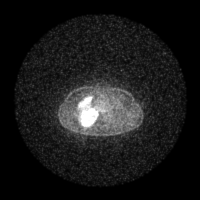
[im 198/198]
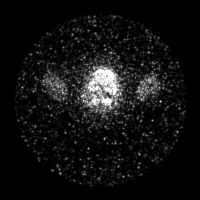

[Series 8: ct sk_thigh 5.0 b70f (id)_bone · axial · 5.0mm · 0.57mm/px · 1 of 67 slices shown]
[im 1/67  bone]
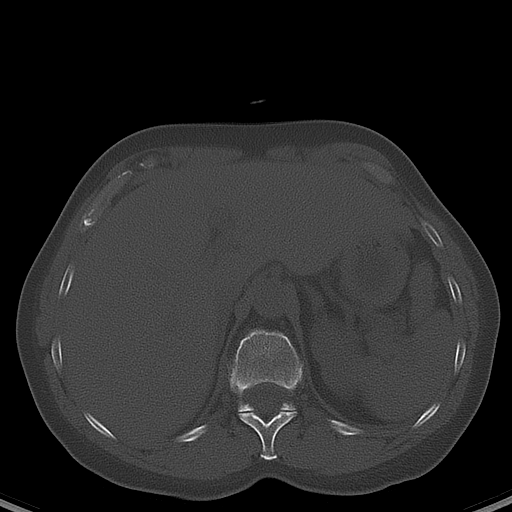

[Series 604: range-ct sk_thigh 5.0 (id)<alpha range> · 2 of 60 slices shown (1 of 2)]
[im 1/60]
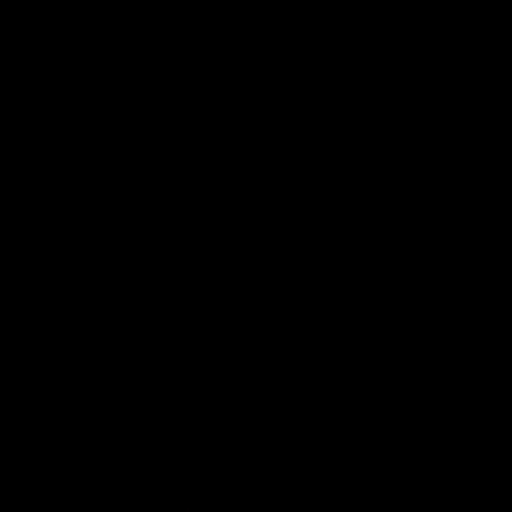
[im 60/60]
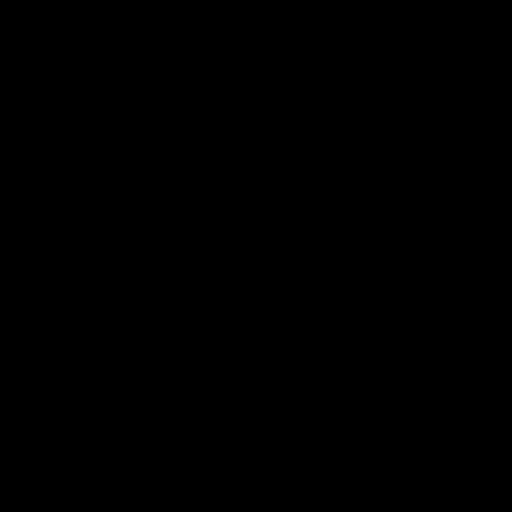

[Series 605: mip collection · coronal · 1.68mm/px · 1 of 32 slices shown]
[im 1/32]
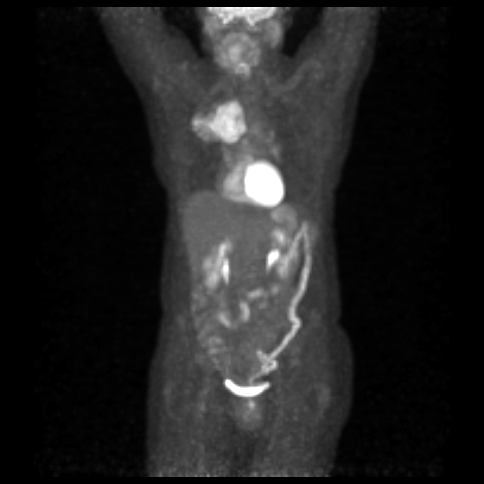

[Series 606: range-ct sk_thigh 5.0 (id)<alpha range> · 3 of 193 slices shown (2 of 2)]
[im 49/193]
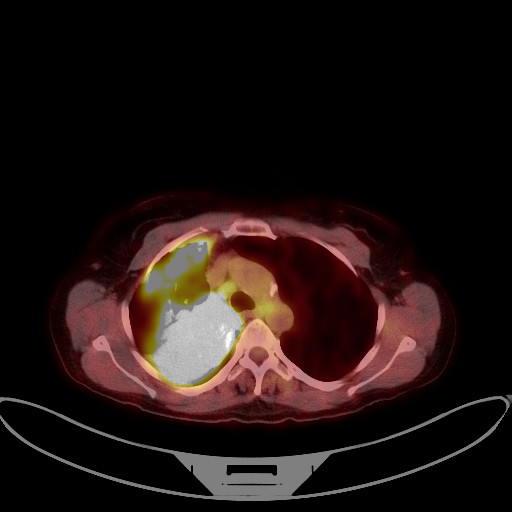
[im 97/193]
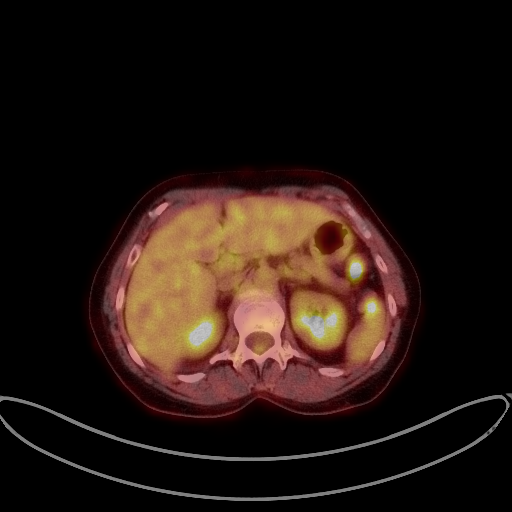
[im 145/193]
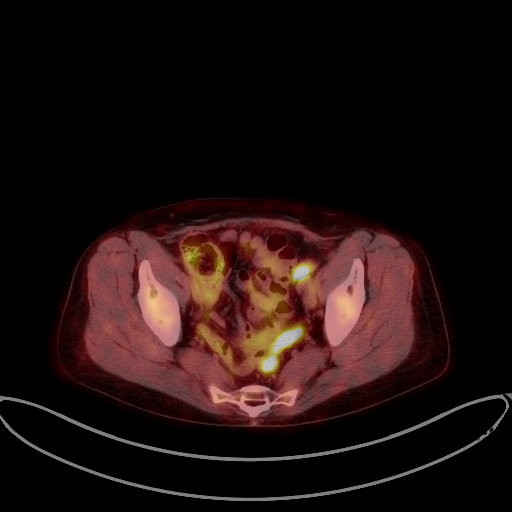

[Series 1032: results mm oncology reading · 0.38mm/px · 1 of 5 slices shown (1 of 2)]
[im 1/5]
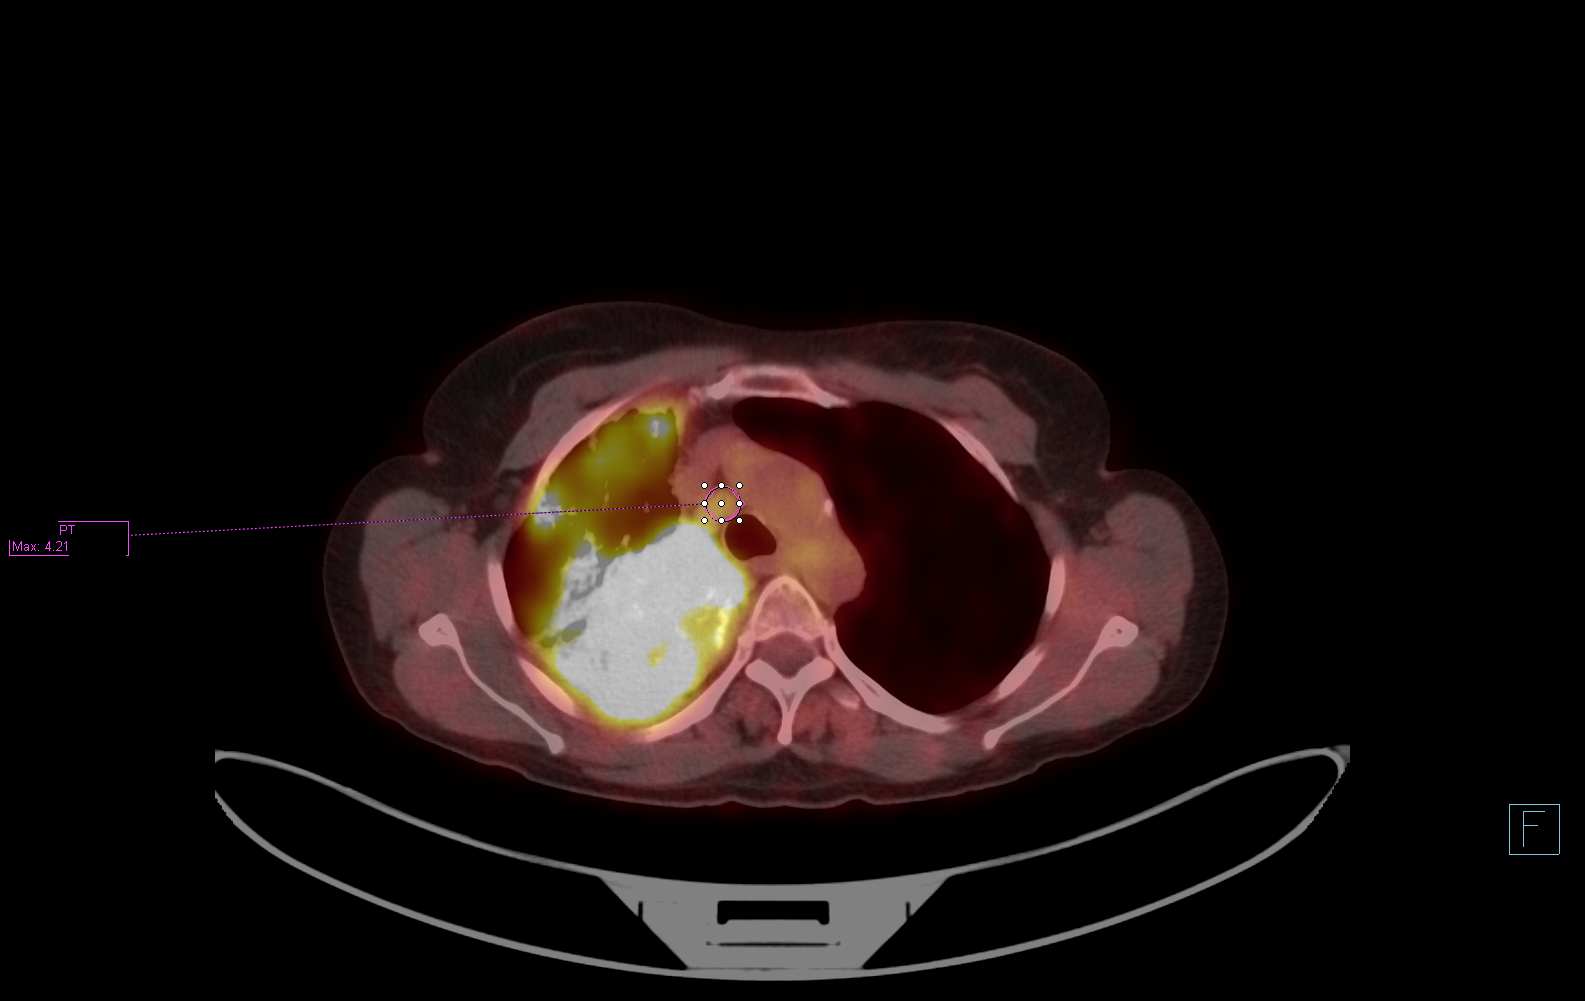

[results mm oncology reading · 1.2mm · 1.05mm/px · 1 of 1 slices shown (2 of 2)]
[im 1/1]
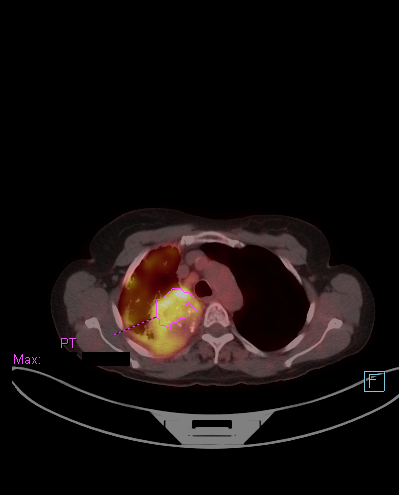

[19 of 25 positions shown; findings below may reference images not displayed]

FINDINGS: NECK

No hypermetabolic lymph nodes in the neck. Symmetric hypermetabolism
in the glottis without CT abnormality, probably physiologic.
Symmetric hypermetabolism within the parotid glands and
submandibular glands without appreciable mass on CT images, likely
physiologic or inflammatory.

CHEST

There is an intensely hypermetabolic 7.9 x 5.6 cm central right
upper lobe lung mass with prominent amorphous internal
calcifications and max SUV 18.4 (series 8/image 22), which encases
and prominently narrows the right upper lobe bronchus, and which
abuts the right major and minor fissures. The mass abuts the right
mediastinal pleura and appears continuous with the right superior
hilum and right lower tracheal wall. There is volume loss in the
right upper lobe. Patchy consolidation with associated
hypermetabolism throughout the periphery of the right upper lobe is
most consistent with postobstructive pneumonia.

At least 5 scattered pulmonary nodules in the right lower lobe
measuring up to 7 mm (series 8/image 51), below PET resolution. Left
lower lobe 5 mm pulmonary nodule (series 8/image 40), below PET
resolution. Moderate centrilobular emphysema.

Hypermetabolic right paratracheal enlarged 1.2 cm node with max SUV
4.2 (series 4/image 51).

Enlarged hypermetabolic 1.2 cm subcarinal node with max SUV
(series 4/image 58).

Hypermetabolic 0.8 cm AP window node with max SUV 4.6 (series
4/image 53).

Hypermetabolic left hilar 0.8 cm node with max SUV 4.8 (series
4/image 59).

No hypermetabolic axillary nodes. Left anterior descending coronary
atherosclerosis. Atherosclerotic nonaneurysmal thoracic aorta. No
pleural effusions.

ABDOMEN/PELVIS

No abnormal hypermetabolic activity within the liver, pancreas,
adrenal glands, or spleen. No hypermetabolic lymph nodes in the
abdomen or pelvis. Atherosclerotic nonaneurysmal abdominal aorta.
Small gastric diverticulum at the posterior gastric fundus.
Hysterectomy.

SKELETON

No focal hypermetabolic activity to suggest skeletal metastasis.
IMPRESSION: 1. Intensely hypermetabolic 7.9 cm central right upper lobe lung
mass, highly suggestive of a primary bronchogenic mucinous carcinoma
given the extensive amorphous internal calcifications. Mass is
confluent with the right superior hilum and right lower tracheal
wall, suggestive of a T4 primary tumor.
2. Postobstructive pneumonia in the right upper lobe.
3. Hypermetabolic ipsilateral and contralateral mediastinal and
contralateral hilar lymphadenopathy, suggestive of N3 nodal disease.
4. Bilateral lower lobe pulmonary nodules, including a 5 mm
contralateral lower lobe pulmonary nodule, cannot exclude pulmonary
metastases.
5. No osseous or extrathoracic metastatic disease.
6. Aortic atherosclerosis.  One vessel coronary atherosclerosis.

## 2018-01-27 ENCOUNTER — Other Ambulatory Visit: Payer: Self-pay

## 2018-01-27 ENCOUNTER — Encounter (HOSPITAL_COMMUNITY): Payer: Self-pay

## 2018-01-27 ENCOUNTER — Inpatient Hospital Stay (HOSPITAL_COMMUNITY): Payer: BLUE CROSS/BLUE SHIELD | Attending: Internal Medicine

## 2018-01-27 VITALS — BP 138/83 | HR 117 | Temp 98.0°F | Resp 22

## 2018-01-27 DIAGNOSIS — R0602 Shortness of breath: Secondary | ICD-10-CM | POA: Diagnosis not present

## 2018-01-27 DIAGNOSIS — R300 Dysuria: Secondary | ICD-10-CM | POA: Diagnosis not present

## 2018-01-27 DIAGNOSIS — M2550 Pain in unspecified joint: Secondary | ICD-10-CM | POA: Insufficient documentation

## 2018-01-27 DIAGNOSIS — R05 Cough: Secondary | ICD-10-CM | POA: Insufficient documentation

## 2018-01-27 DIAGNOSIS — C3411 Malignant neoplasm of upper lobe, right bronchus or lung: Secondary | ICD-10-CM | POA: Diagnosis present

## 2018-01-27 DIAGNOSIS — R7989 Other specified abnormal findings of blood chemistry: Secondary | ICD-10-CM | POA: Insufficient documentation

## 2018-01-27 DIAGNOSIS — Z95828 Presence of other vascular implants and grafts: Secondary | ICD-10-CM

## 2018-01-27 LAB — CBC WITH DIFFERENTIAL/PLATELET
BASOS ABS: 0 10*3/uL (ref 0.0–0.1)
BASOS PCT: 0 %
Eosinophils Absolute: 0 10*3/uL (ref 0.0–0.7)
Eosinophils Relative: 0 %
HEMATOCRIT: 37.6 % (ref 36.0–46.0)
Hemoglobin: 12 g/dL (ref 12.0–15.0)
LYMPHS PCT: 5 %
Lymphs Abs: 0.6 10*3/uL — ABNORMAL LOW (ref 0.7–4.0)
MCH: 31.3 pg (ref 26.0–34.0)
MCHC: 31.9 g/dL (ref 30.0–36.0)
MCV: 97.9 fL (ref 78.0–100.0)
MONOS PCT: 6 %
Monocytes Absolute: 0.7 10*3/uL (ref 0.1–1.0)
NEUTROS PCT: 89 %
Neutro Abs: 9.5 10*3/uL — ABNORMAL HIGH (ref 1.7–7.7)
PLATELETS: 478 10*3/uL — AB (ref 150–400)
RBC: 3.84 MIL/uL — AB (ref 3.87–5.11)
RDW: 15.2 % (ref 11.5–15.5)
WBC: 10.7 10*3/uL — ABNORMAL HIGH (ref 4.0–10.5)

## 2018-01-27 LAB — COMPREHENSIVE METABOLIC PANEL
ALT: 57 U/L — AB (ref 14–54)
AST: 40 U/L (ref 15–41)
Albumin: 4 g/dL (ref 3.5–5.0)
Alkaline Phosphatase: 117 U/L (ref 38–126)
Anion gap: 15 (ref 5–15)
BUN: 17 mg/dL (ref 6–20)
CHLORIDE: 98 mmol/L — AB (ref 101–111)
CO2: 23 mmol/L (ref 22–32)
CREATININE: 1.15 mg/dL — AB (ref 0.44–1.00)
Calcium: 9.1 mg/dL (ref 8.9–10.3)
GFR calc Af Amer: 59 mL/min — ABNORMAL LOW (ref >60)
GFR calc non Af Amer: 51 mL/min — ABNORMAL LOW (ref >60)
Glucose, Bld: 106 mg/dL — ABNORMAL HIGH (ref 65–99)
POTASSIUM: 4 mmol/L (ref 3.5–5.1)
SODIUM: 136 mmol/L (ref 135–145)
Total Bilirubin: 0.6 mg/dL (ref 0.3–1.2)
Total Protein: 7.8 g/dL (ref 6.5–8.1)

## 2018-01-27 MED ORDER — SODIUM CHLORIDE 0.9% FLUSH
10.0000 mL | INTRAVENOUS | Status: DC | PRN
  Administered 2018-01-27: 10 mL via INTRAVENOUS
  Filled 2018-01-27: qty 10

## 2018-01-27 MED ORDER — HEPARIN SOD (PORK) LOCK FLUSH 100 UNIT/ML IV SOLN
500.0000 [IU] | Freq: Once | INTRAVENOUS | Status: AC
  Administered 2018-01-27: 500 [IU] via INTRAVENOUS

## 2018-01-27 MED ORDER — HEPARIN SOD (PORK) LOCK FLUSH 100 UNIT/ML IV SOLN
INTRAVENOUS | Status: AC
  Filled 2018-01-27: qty 5

## 2018-01-27 NOTE — Patient Instructions (Signed)
[place] at [facility] Discharge Instructions  RECOMMENDATIONS MADE BY THE CONSULTANT AND ANY TEST RESULTS WILL BE SENT TO YOUR REFERRING PHYSICIAN.  You had your port flushed today and lab work drawn. We will call you with the results. Follow up next week as scheduled.  Thank you for choosing [place] at [facility] to provide your oncology and hematology care.  To afford each patient quality time with our provider, please arrive at least 15 minutes before your scheduled appointment time.    If you have a lab appointment with the [facility] please come in thru the  Main Entrance and check in at the main information desk  You need to re-schedule your appointment should you arrive 10 or more minutes late.  We strive to give you quality time with our providers, and arriving late affects you and other patients whose appointments are after yours.  Also, if you no show three or more times for appointments you may be dismissed from the clinic at the providers discretion.     Again, thank you for choosing [facility].  Our hope is that these requests will decrease the amount of time that you wait before being seen by our physicians.       _____________________________________________________________  Should you have questions after your visit to [facility], please contact our office at [phone] between the hours of 8:30 a.m. and 4:30 p.m.  Voicemails left after 4:30 p.m. will not be returned until the following business day.  For prescription refill requests, have your pharmacy contact our office.       Resources For Cancer Patients and their Caregivers ? American Cancer Society: Can assist with transportation, wigs, general needs, runs Look Good Feel Better.        419-390-9509 ? Cancer Care: Provides financial assistance, online support groups, medication/co-pay assistance.  1-800-813-HOPE 501-415-9088) ? [facility] Assists [place] cancer patients and their families through emotional , educational and financial support.  979-355-2454 ? [place] DSS Where to apply for food stamps, Medicaid and utility assistance. 2493775923 ? [employer]: Transportation to medical appointments. 347-376-5944 ? Social Security Administration: May apply for disability if have a Stage IV cancer. 415-754-2433 443 446 4567 ? [employer], Disability and Transit Services: Assists with nutrition, care and transit needs. [place] Support Programs: @10RELATIVEDAYS @ > Cancer Support Group  2nd Tuesday of the month 1pm-2pm, Journey Room  > Creative Journey  3rd Tuesday of the month 1130am-1pm, Journey Room  > Look Good Feel Better  1st Wednesday of the month 10am-12 noon, Journey Room (Call [facility] to register [phone])

## 2018-01-27 NOTE — Progress Notes (Addendum)
Patient reports having increased difficulty breathing. She recently was seen by Dr. Luan Pulling who put her on another prednisone taper dose and she is to remain on that until she sees him again.  Patient states that she was doing good and then all of a sudden she got to where she couldn't walk 10 feet without having to stop and catch her breath.  Her breathing today is a little labored but she states that is how it has been.  Her vitals are stable at this time.    Emily Pope presented for Portacath access and flush. Portacath located left chest wall accessed with  H 20 needle. Good blood return present and labs drawn from port. Portacath flushed with 33ml NS and 500U/74ml Heparin and needle removed intact. Patient tolerated procedure well.  Patient discharged ambulatory and in stable condition from clinic. Patient to follow up next week as scheduled.

## 2018-01-30 ENCOUNTER — Ambulatory Visit (HOSPITAL_COMMUNITY)
Admission: RE | Admit: 2018-01-30 | Discharge: 2018-01-30 | Disposition: A | Discharge status: Home / Self Care | Payer: BLUE CROSS/BLUE SHIELD | Source: Ambulatory Visit | Attending: Adult Health | Admitting: Adult Health

## 2018-01-30 DIAGNOSIS — I700 Atherosclerosis of aorta: Secondary | ICD-10-CM | POA: Insufficient documentation

## 2018-01-30 DIAGNOSIS — C3490 Malignant neoplasm of unspecified part of unspecified bronchus or lung: Secondary | ICD-10-CM

## 2018-01-30 DIAGNOSIS — K760 Fatty (change of) liver, not elsewhere classified: Secondary | ICD-10-CM | POA: Diagnosis not present

## 2018-01-30 DIAGNOSIS — J432 Centrilobular emphysema: Secondary | ICD-10-CM | POA: Insufficient documentation

## 2018-01-30 DIAGNOSIS — R918 Other nonspecific abnormal finding of lung field: Secondary | ICD-10-CM | POA: Diagnosis not present

## 2018-01-30 DIAGNOSIS — I2510 Atherosclerotic heart disease of native coronary artery without angina pectoris: Secondary | ICD-10-CM | POA: Insufficient documentation

## 2018-02-01 ENCOUNTER — Inpatient Hospital Stay (HOSPITAL_COMMUNITY): Payer: BLUE CROSS/BLUE SHIELD

## 2018-02-01 ENCOUNTER — Ambulatory Visit (HOSPITAL_COMMUNITY): Payer: BLUE CROSS/BLUE SHIELD | Admitting: Internal Medicine

## 2018-02-01 ENCOUNTER — Inpatient Hospital Stay (HOSPITAL_BASED_OUTPATIENT_CLINIC_OR_DEPARTMENT_OTHER): Payer: BLUE CROSS/BLUE SHIELD | Admitting: Internal Medicine

## 2018-02-01 ENCOUNTER — Encounter (HOSPITAL_COMMUNITY): Payer: Self-pay | Admitting: Internal Medicine

## 2018-02-01 ENCOUNTER — Other Ambulatory Visit: Payer: Self-pay

## 2018-02-01 VITALS — BP 132/75 | HR 100 | Temp 97.6°F | Resp 22 | Wt 139.8 lb

## 2018-02-01 DIAGNOSIS — C3411 Malignant neoplasm of upper lobe, right bronchus or lung: Secondary | ICD-10-CM

## 2018-02-01 DIAGNOSIS — M2550 Pain in unspecified joint: Secondary | ICD-10-CM

## 2018-02-01 DIAGNOSIS — R0602 Shortness of breath: Secondary | ICD-10-CM

## 2018-02-01 DIAGNOSIS — R300 Dysuria: Secondary | ICD-10-CM | POA: Diagnosis not present

## 2018-02-01 DIAGNOSIS — R7989 Other specified abnormal findings of blood chemistry: Secondary | ICD-10-CM

## 2018-02-01 DIAGNOSIS — R05 Cough: Secondary | ICD-10-CM | POA: Diagnosis not present

## 2018-02-01 DIAGNOSIS — M1990 Unspecified osteoarthritis, unspecified site: Secondary | ICD-10-CM

## 2018-02-01 LAB — URINALYSIS, ROUTINE W REFLEX MICROSCOPIC
Bacteria, UA: NONE SEEN
Bilirubin Urine: NEGATIVE
GLUCOSE, UA: NEGATIVE mg/dL
Hgb urine dipstick: NEGATIVE
KETONES UR: NEGATIVE mg/dL
NITRITE: NEGATIVE
PH: 6 (ref 5.0–8.0)
Protein, ur: NEGATIVE mg/dL
SQUAMOUS EPITHELIAL / LPF: NONE SEEN
Specific Gravity, Urine: 1.008 (ref 1.005–1.030)

## 2018-02-01 LAB — BASIC METABOLIC PANEL
ANION GAP: 12 (ref 5–15)
BUN: 15 mg/dL (ref 6–20)
CALCIUM: 9.1 mg/dL (ref 8.9–10.3)
CHLORIDE: 101 mmol/L (ref 101–111)
CO2: 25 mmol/L (ref 22–32)
Creatinine, Ser: 0.85 mg/dL (ref 0.44–1.00)
GFR calc Af Amer: >60 mL/min (ref >60)
GFR calc non Af Amer: >60 mL/min (ref >60)
Glucose, Bld: 117 mg/dL — ABNORMAL HIGH (ref 65–99)
Potassium: 4.1 mmol/L (ref 3.5–5.1)
Sodium: 138 mmol/L (ref 135–145)

## 2018-02-01 MED ORDER — ALPRAZOLAM 0.25 MG PO TABS: 0.2500 mg | ORAL_TABLET | Freq: Two times a day (BID) | ORAL | 0 refills | Status: AC | PRN

## 2018-02-01 NOTE — Progress Notes (Signed)
Chief complaint: Ms. Emily Pope returns for follow-up for right upper lobe non-small cell lung cancer-adenocarcinoma diagnosed in August 2017 stage IIIb.    HPI: She received carboplatin paclitaxel concurrently with radiation ending in October 2017.  2 additional cycles of consolidative carboplatin and paclitaxel were given until December 2017.  She then received about 6 months of infancy therapy which was discontinued in May 2018 due to pneumonitis.  Her treatment was complicated by radiation induced fibrosis of the lung, and radiation esophagitis. She returns today feeling very anxious and complaining of increased shortness of breath and dry cough with expiratory wheezing.  She also has been experiencing pleuritic chest pain worse with cough both sides of the chest.  She has been having trouble swallowing liquids and has had occasional choking spells.  She brought this to the attention of her pulmonologist Dr. Luan Pulling who is given her a course of prednisone along with bronchodilator inhalers she is also on proton pump inhibitors to reduce reflux esophagitis. She complains of pain in multiple joints including the heels, knees, elbows, shoulders, finger joints. She has no sputum production no hemoptysis.  She denies much weight loss.  Appetite is poor.  Denies any hoarseness of voice.  CT chest on 01/30/2018 shows stable radiation pneumonitis and stable lung nodules.  Incidentally noted was coronary atherosclerosis  Blood pressure 132/75, pulse 100, temperature 97.6 F (36.4 C), temperature source Oral, resp. rate (!) 22, weight 139 lb 12.8 oz (63.4 kg), last menstrual period 08/04/1985, SpO2 98 %.  Exam  shows her to be anxious slightly dyspneic at rest but without any cyanosis.  Despite her respirations being at 22 she is saturating on room air at 98%.  She was able to complete sentences had episodes of cough during the conversation but there were also intermittent episodes of expiratory  wheeze.  Percussion of the lungs shows no dullness equal air entry both lungs and there were no rhonchi or rales.  Impression and plan:  ---Personal history of stage IIIb adenocarcinoma.  Based on the CT scan there appears to be no evidence of recurrence. ---Progressive cough and shortness of breath with expiratory stridor.  CT scans did not show evidence of mediastinal or hilar adenopathy.  There is no acute infiltrate, but there is slight increase in the right apical and anterior right lower lobe consolidation with architectural distortion felt to be related to prior radiation.She is being managed by pulmonologist, Dr. Luan Pulling and failed to respond to bronchodilators and a course of steroids. ---Differential diagnosis include acute exacerbation of COPD, worsening interstitial pneumonitis, radiation related tracheal stenosis.   ---An underlying cardiac etiology cannot be ruled out particularly noting the incidental atherosclerosis noted on the CT scan. ---Multiple joint symptoms and dry mouth may also point to an underlying autoimmune disease. ---Her symptoms could further be aggravated by her anxiety.  Contact Dr. Luan Pulling with regard to poorly controlled symptoms.  She may require a bronchoscopy. A trial of low-dose Xanax to help improve her anxiety.  Referred to cardiology for workup for cardiac source. If symptoms worsen, proceed to the nearest emergency room.  Dysuria- obtain a urinalysis and urine for eosinophils to rule out interstitial cystitis.  Multiple joint symptoms- rule out autoimmune arthritis- check ANA, rheumatoid factor  Increase in serum creatinine noted last week from baseline.  Likely due to ARB losartan plus hydrochlorothiazide.  Asked to withhold that blood pressure today stable. Repeat BMP today.    CBC shows mild neutrophilia and thrombocytosis, consistent with reaction to the  underlying inflammation  RTC in 2 months.

## 2018-02-01 NOTE — Patient Instructions (Signed)
[facility] at [facility] Discharge Instructions  RECOMMENDATIONS MADE BY THE CONSULTANT AND ANY TEST RESULTS WILL BE SENT TO YOUR REFERRING PHYSICIAN.  You were seen today by Dr. [place]  Thank you for choosing [facility] at [facility] to provide your oncology and hematology care.  To afford each patient quality time with our provider, please arrive at least 15 minutes before your scheduled appointment time.    If you have a lab appointment with the Cancer Center please come in thru the  Main Entrance and check in at the main information desk  You need to re-schedule your appointment should you arrive 10 or more minutes late.  We strive to give you quality time with our providers, and arriving late affects you and other patients whose appointments are after yours.  Also, if you no show three or more times for appointments you may be dismissed from the clinic at the providers discretion.     Again, thank you for choosing [facility].  Our hope is that these requests will decrease the amount of time that you wait before being seen by our physicians.       _____________________________________________________________  Should you have questions after your visit to [facility], please contact our office at [phone] between the hours of 8:30 a.m. and 4:30 p.m.  Voicemails left after 4:30 p.m. will not be returned until the following business day.  For prescription refill requests, have your pharmacy contact our office.       Resources For Cancer Patients and their Caregivers ? American Cancer Society: Can assist with transportation, wigs, general needs, runs Look Good Feel Better.        1-888-227-6333 ? Cancer Care: Provides financial assistance, online support groups, medication/co-pay assistance.  1-800-813-HOPE (4673) ? [facility] Assists [place] cancer patients and their families  through emotional , educational and financial support.  336-427-4357 ? [place] DSS Where to apply for food stamps, Medicaid and utility assistance. 336-342-1394 ? [employer]: Transportation to medical appointments. 336-347-2287 ? Social Security Administration: May apply for disability if have a Stage IV cancer. 336-342-7796 1-800-772-1213 ? [place] Aging, Disability and Transit Services: Assists with nutrition, care and transit needs. 336-349-2343  Cancer Center Support Programs: @10RELATIVEDAYS@ > Cancer Support Group  2nd Tuesday of the month 1pm-2pm, Journey Room  > Creative Journey  3rd Tuesday of the month 1130am-1pm, Journey Room  > Look Good Feel Better  1st Wednesday of the month 10am-12 noon, Journey Room (Call [employer] to register 1-800-395-5775)     

## 2018-02-02 LAB — EOSINOPHIL, URINE: EOSINOPHIL, URINE: NONE SEEN %

## 2018-02-02 LAB — RHEUMATOID FACTOR: RHEUMATOID FACTOR: 10.3 [IU]/mL (ref 0.0–13.9)

## 2018-02-03 ENCOUNTER — Encounter: Payer: Self-pay | Admitting: Cardiology

## 2018-02-03 ENCOUNTER — Telehealth (HOSPITAL_COMMUNITY): Payer: Self-pay | Admitting: *Deleted

## 2018-02-03 LAB — ANTINUCLEAR ANTIBODIES, IFA: ANA Ab, IFA: NEGATIVE

## 2018-02-03 NOTE — Telephone Encounter (Signed)
Patient called the clinic, she is very SOB, tachypnic, labored respirations, wheezing heard over the telephone.  Patient reports that she has a cough and has not been able to get her breath.  She reports feeling like she is going to pass out.  I asked her if she was alone and she states her husband is there with her, she said "I just cant breath"  I instructed her to have her husband take her straight to the nearest emergency room.  She verbalizes understanding.

## 2018-02-05 ENCOUNTER — Encounter: Payer: Self-pay | Admitting: Cardiology

## 2018-02-13 ENCOUNTER — Telehealth (HOSPITAL_COMMUNITY): Payer: Self-pay

## 2018-02-13 NOTE — Telephone Encounter (Signed)
Called patient to check on her per Dr. Bangladesh. She stated she went to [facility] for emercency care. She was admitted by her PCP, put on antibiotics. Is now feeling better, finishes her antibiotics today and follow up with PCP tomorrow and to see Dr. Luan Pulling on March the 4th.

## 2018-02-15 ENCOUNTER — Ambulatory Visit: Payer: BLUE CROSS/BLUE SHIELD | Admitting: Cardiovascular Disease

## 2018-02-27 ENCOUNTER — Telehealth (HOSPITAL_COMMUNITY): Payer: Self-pay | Admitting: Emergency Medicine

## 2018-02-27 NOTE — Telephone Encounter (Signed)
Pt came to the clinic today to let me know that she did not want to see Dr Bangladesh anymore.  I let her know that she would be seeing the new permanent doctor when she came in in April.  She expressed her concerns of what happened in her last visit.  The patient was told to stop her blood pressure medication (which the pt  did not do), pt was told that Dr Bangladesh would follow up with Dr Luan Pulling about her care (which Dr Bangladesh did not do), The pt was told to start taking Xanax bc this would help with her breathing and anxiety.  She did get an appt to see the cardiologist.  But Dr Bangladesh would not refill her tramadol or mobic or emla cream which we started her on.  I will follow up with the nurse practitioner  about refilling these medications for the patient.  I apologized to the patient.  She was thankful for the talk.

## 2018-03-01 ENCOUNTER — Encounter: Payer: Self-pay | Admitting: Cardiology

## 2018-03-01 NOTE — Progress Notes (Signed)
Cardiology Office Note  Date: 03/02/2018   ID: [name], DOB [birth year], MRN [id number]  PCP: [name], MD  Consulting cardiologist: Rozann Lesches, MD   Chief Complaint  Patient presents with  . Coronary artery calcification    History of Present Illness: Emily Pope is a 62 y.o. female referred for cardiology consultation by Dr. Leta Baptist due to incidental findings of coronary artery calcifications by chest CT imaging.  She has a history of right-sided lung cancer, stage IIIb adenocarcinoma with prior treatment including carboplatin and paclitaxel followed by radiation which was discontinued due to pneumonitis.  She has long-standing shortness of breath and cough, follows with Dr. Luan Pulling.  Records indicate admission to [facility] in February with shortness of breath and worsening cough.  She was treated for right lower lobe pneumonia, although has had radiation changes on that side.  She was managed by Dr. Woody Seller.  Troponin T levels were negative.  I personally reviewed her ECG from 02/03/2018 which showed sinus rhythm with possible left atrial enlargement.  She presents today describing chronic shortness of breath with intermittent coughing spells, NYHA class II-III.  She does not report chest tightness or pain specifically.  No palpitations.  She has had posttussive syncope.  She does not report any prior ischemic testing.  She reports no prior documented history of cardiomyopathy.  I reviewed her most recent ECG and lab work.  Past Medical History:  Diagnosis Date  . Anxiety   . Arthritis   . Chronic bronchitis ([place])   . COPD (chronic obstructive pulmonary disease) ([place])   . Essential hypertension   . GERD (gastroesophageal reflux disease)   . Headache   . History of pneumonia   . Pneumonitis   . Primary cancer of right upper lobe of lung ([place])    Stage IIIb adenocarcinoma    Past Surgical History:  Procedure Laterality Date  . ABDOMINAL  HYSTERECTOMY    . APPENDECTOMY     when had hysterectomy  . CARPAL TUNNEL RELEASE Bilateral   . ENDOBRONCHIAL ULTRASOUND Bilateral 08/23/2016   Procedure: ENDOBRONCHIAL ULTRASOUND;  Surgeon: Collene Gobble, MD;  Location: [facility];  Service: Cardiopulmonary;  Laterality: Bilateral;  . HERNIA REPAIR     left inguinal hernia age 70  . PORTACATH PLACEMENT Left 09/17/2016   Procedure: INSERTION PORT-A-CATH LEFT SUBCLAVIAN;  Surgeon: Aviva Signs, MD;  Location: [facility];  Service: General;  Laterality: Left;  . TUBAL LIGATION      Current Outpatient Medications  Medication Sig Dispense Refill  . albuterol (PROVENTIL HFA;VENTOLIN HFA) 108 (90 Base) MCG/ACT inhaler Inhale 2 puffs into the lungs every 6 (six) hours as needed for wheezing or shortness of breath. 1 Inhaler 6  . buPROPion (WELLBUTRIN XL) 150 MG 24 hr tablet Take 150 mg by mouth daily.    Marland Kitchen esomeprazole (NEXIUM) 20 MG capsule Take 1 capsule (20 mg total) by mouth daily at 12 noon. 30 capsule 2  . lidocaine-prilocaine (EMLA) cream Apply 1 application topically as needed. 30 g 3  . losartan-hydrochlorothiazide (HYZAAR) 50-12.5 MG tablet   1  . meloxicam (MOBIC) 15 MG tablet Take 1 tablet (15 mg total) by mouth daily. 30 tablet 2  . predniSONE (DELTASONE) 5 MG tablet Take 5 mg by mouth daily with breakfast.    . traMADol (ULTRAM) 50 MG tablet TAKE 1 TABLET BY MOUTH EVERY 6 HOURS AS NEEDED FOR  MODERATE  PAIN 28 tablet 0  . TRELEGY ELLIPTA 100-62.5-25 MCG/INH AEPB  Take 2 puffs by mouth daily.  0  . vitamin B-12 (CYANOCOBALAMIN) 1000 MCG tablet Take 1,000 mcg by mouth daily.     No current facility-administered medications for this visit.    Allergies:  Clindamycin/lincomycin; Codeine; Lincomycin; Penicillins; Vancomycin; Hydrocodone; and Sulfa antibiotics   Social History: The patient  reports that she quit smoking about 19 months ago. She has a 86.00 pack-year smoking history. she has never used smokeless tobacco. She reports that she  does not drink alcohol or use drugs.   Family History: The patient's family history includes Diabetes in her father; Hypertension in her father, mother, sister, and sister; Lung cancer in her brother and mother.   ROS:  Please see the history of present illness. Otherwise, complete review of systems is positive for fatigue, neuropathy symptoms in her legs.  All other systems are reviewed and negative.   Physical Exam: VS:  BP (!) 142/72   Pulse 99   Ht 5' (1.524 m)   Wt 140 lb (63.5 kg)   LMP 08/04/1985 (Approximate) Comment: hysterectomy @ 62 years old  SpO2 99%   BMI 27.34 kg/m , BMI Body mass index is 27.34 kg/m.  Wt Readings from Last 3 Encounters:  03/02/18 140 lb (63.5 kg)  02/01/18 139 lb 12.8 oz (63.4 kg)  10/28/17 129 lb 11.2 oz (58.8 kg)    General: Patient appears comfortable at rest. HEENT: Conjunctiva and lids normal, oropharynx clear. Neck: Supple, no elevated JVP or carotid bruits, no thyromegaly. Lungs: Coarse breath sounds without wheezing, nonlabored breathing at rest. Cardiac: Regular rate and rhythm, no S3, soft systolic murmur. Abdomen: Soft, nontender, bowel sounds present, no guarding or rebound. Extremities: No pitting edema, distal pulses 2+. Skin: Warm and dry. Musculoskeletal: No kyphosis. Neuropsychiatric: Alert and oriented x3, affect grossly appropriate.  ECG: I personally reviewed the tracing from 06/13/2017 which showed sinus tachycardia with PVCs.  Recent Labwork: 08/18/2017: TSH 3.228 01/27/2018: ALT 57; AST 40; Hemoglobin 12.0; Platelets 478 02/01/2018: BUN 15; Creatinine, Ser 0.85; Potassium 4.1; Sodium 138   Other Studies Reviewed Today:  Chest CT 01/30/2018: FINDINGS: Cardiovascular: Left Port-A-Cath is unchanged in position, with tip at confluence of the brachiocephalic veins. Aortic atherosclerosis. Tortuous thoracic aorta. Normal heart size, without pericardial effusion. Mitral valve calcifications. Lad coronary  artery atherosclerosis.  Mediastinum/Nodes: No supraclavicular adenopathy. No mediastinal or definite hilar adenopathy, given limitations of unenhanced CT.  Lungs/Pleura: Mild right-sided pleural thickening is similar. Moderate centrilobular emphysema.  7 mm right lower lobe pulmonary nodule is similar on image 101/series 4.  Vague area of ill-defined nodularity measures on the order of 3 mm a right lower lobe on image 71/series 4, not readily apparent on the prior exam. This could be due to thinner slice collimation today.  Minimal progression of right apical and upper lobe consolidation with areas of traction bronchiectasis and calcification. No well-defined locally recurrent or residual disease  Minimal interstitial thickening in the anteromedial left upper lobe, similar and likely radiation induced.  Upper Abdomen: Mild hepatic steatosis. Too small to characterize high right hepatic lobe low-density lesion is similar to on the prior exam. Normal imaged portions of the spleen, pancreas, adrenal glands, kidneys. Gastric diverticulum incidentally noted.  Musculoskeletal: No acute osseous abnormality.  IMPRESSION: 1. Slight increase in right apical and anterior right lower lobe consolidation, architectural distortion, with similar calcification. Findings are most consistent with evolving radiation change. No convincing evidence of residual or recurrent disease. 2. No thoracic adenopathy. 3. Similar right lower lobe 7 mm  of mercury nodule. 4. Age advanced coronary artery atherosclerosis. Recommend assessment of coronary risk factors and consideration of medical therapy. 5. Aortic atherosclerosis (ICD10-I70.0) and emphysema (ICD10-J43.9). 6. Hepatic steatosis. 7. Similar atypical position of Port-A-Cath.  Assessment and Plan:  1.  Coronary artery calcifications by chest CT imaging.  She does not report definitive angina symptoms but has chronic shortness of breath  in the setting of right-sided lung cancer, stage IIIb adenocarcinoma treated with chemotherapy and radiation with resulting pneumonitis.  She follows chronically with Dr. Luan Pulling for pulmonary management.  Our plan at this time is to obtain an echocardiogram to assess cardiac structure and function and also a dobutamine Myoview to exclude any significant ischemic burden that might require further evaluation.  If this is not the case, recommend baby aspirin daily and obtaining lipid panel per PCP as a statin would be a consideration.  2.  History of right-sided stage IIIb adenocarcinoma of the lung status post treatment with carboplatin and paclitaxel as well as radiation and subsequent pneumonitis.  She follows with Oncology and Dr. Luan Pulling.  No history of cardiomyopathy.  3.  Essential hypertension, on Hyzaar.  No changes made today.  4.  Tobacco abuse in remission.  Current medicines were reviewed with the patient today.   Orders Placed This Encounter  Procedures  . NM Myocar Multi W/Spect W/Wall Motion / EF  . ECHOCARDIOGRAM COMPLETE    Disposition: Call with test results.  Signed, Satira Sark, MD, [facility] 03/02/2018 11:03 AM    [place] at [place], [place], [place] [address] Phone: [phone]; Fax: [phone]

## 2018-03-02 ENCOUNTER — Telehealth: Payer: Self-pay | Admitting: Cardiology

## 2018-03-02 ENCOUNTER — Telehealth (HOSPITAL_COMMUNITY): Payer: Self-pay | Admitting: *Deleted

## 2018-03-02 ENCOUNTER — Other Ambulatory Visit (HOSPITAL_COMMUNITY): Payer: Self-pay | Admitting: Adult Health

## 2018-03-02 ENCOUNTER — Encounter (HOSPITAL_COMMUNITY): Payer: Self-pay | Admitting: Adult Health

## 2018-03-02 ENCOUNTER — Telehealth (HOSPITAL_COMMUNITY): Payer: Self-pay | Admitting: Emergency Medicine

## 2018-03-02 ENCOUNTER — Ambulatory Visit: Payer: BLUE CROSS/BLUE SHIELD | Admitting: Cardiology

## 2018-03-02 ENCOUNTER — Encounter: Payer: Self-pay | Admitting: Cardiology

## 2018-03-02 VITALS — BP 142/72 | HR 99 | Ht 60.0 in | Wt 140.0 lb

## 2018-03-02 DIAGNOSIS — C3411 Malignant neoplasm of upper lobe, right bronchus or lung: Secondary | ICD-10-CM

## 2018-03-02 DIAGNOSIS — R0602 Shortness of breath: Secondary | ICD-10-CM | POA: Diagnosis not present

## 2018-03-02 DIAGNOSIS — F17201 Nicotine dependence, unspecified, in remission: Secondary | ICD-10-CM | POA: Diagnosis not present

## 2018-03-02 DIAGNOSIS — I2510 Atherosclerotic heart disease of native coronary artery without angina pectoris: Secondary | ICD-10-CM | POA: Diagnosis not present

## 2018-03-02 DIAGNOSIS — I10 Essential (primary) hypertension: Secondary | ICD-10-CM | POA: Diagnosis not present

## 2018-03-02 MED ORDER — MELOXICAM 15 MG PO TABS: 15.0000 mg | ORAL_TABLET | Freq: Every day | ORAL | 2 refills | Status: DC

## 2018-03-02 MED ORDER — LIDOCAINE-PRILOCAINE 2.5-2.5 % EX CREA: 1.0000 "application " | TOPICAL_CREAM | CUTANEOUS | 3 refills | Status: DC | PRN

## 2018-03-02 MED ORDER — TRAMADOL HCL 50 MG PO TABS
ORAL_TABLET | ORAL | 0 refills | Status: DC
Start: 2018-03-02 — End: 2018-03-02

## 2018-03-02 MED ORDER — TRAMADOL HCL 50 MG PO TABS: ORAL_TABLET | ORAL | 0 refills | Status: DC

## 2018-03-02 NOTE — Patient Instructions (Signed)
Medication Instructions:  .isntcurr  Labwork: NONE  Testing/Procedures: Your physician has requested that you have a dobutamine myoview. For furth information please visit [email or web address]. Please follow instruction sheet, as given.  Your physician has requested that you have an echocardiogram. Echocardiography is a painless test that uses sound waves to create images of your heart. It provides your doctor with information about the size and shape of your heart and how well your heart's chambers and valves are working. This procedure takes approximately one hour. There are no restrictions for this procedure.  Follow-Up: Your physician recommends that you schedule a follow-up appointment PENDING TEST RESULTS  Any Other Special Instructions Will Be Listed Below (If Applicable).  If you need a refill on your cardiac medications before your next appointment, please call your pharmacy.

## 2018-03-02 NOTE — Progress Notes (Signed)
Patient called cancer center requesting refill of Tramadol.   [place] Controlled Substance Reporting System reviewed and refill is appropriate on or after Tramaxol. Medication e-scribed to her pharmacy [facility]) using [employer]'s 2-step verification process.    NCCSRS reviewed:     Mike Craze, NP [place] [phone]

## 2018-03-02 NOTE — Telephone Encounter (Signed)
Pre-cert Verification for the following procedure   Echo scheduled for 03/15/18  Dobutamine Myoview scheduled for 03/07/18 at [facility]

## 2018-03-02 NOTE — Progress Notes (Signed)
Patient called to make [place] aware that her insurance would only pay for 7 days worth of tramadol for her since it has been some time since her last refill.   Rx sent to [facility] for #28 pills Tramadol. She has been instructed by Joanne Gavel, RN to call us when she needs an additional refill and I am happy to send that in for her.   Mike Craze, NP [place] [phone]

## 2018-03-02 NOTE — Telephone Encounter (Signed)
I sent in #28 pills for her (7 days worth) of Tramadol.  She can call us when she's ready for additional refills and I'm happy to send that in for her when needed.   Mike Craze, NP [place] [phone]

## 2018-03-02 NOTE — Telephone Encounter (Signed)
The Rx is written for 1 tab every 6 hours PRN. A 7-day supply of this medication is #28 pills, which was sent to her pharmacy.   Mike Craze, NP [place] [phone]

## 2018-03-02 NOTE — Telephone Encounter (Signed)
Called pt to let her know that we called in the emla cream, tramadol, and meloxicam that she needed refills on.  She said that insurance would only pay for 7 day supply of her tramadol since she had not had it filled since august.  They wanted to make sure she knew how to use it correctly before giving her a 30 day supply.  RN explained to call when she needed a refill and we would send her in another prescription.  Mike Craze NP notified.

## 2018-03-02 NOTE — Telephone Encounter (Signed)
Patient picked up the tramadol this am (3/7). She was aware of having to take the weeks worth prior to getting a months refill. She will call next week when she needs a refill.

## 2018-03-07 ENCOUNTER — Encounter (HOSPITAL_COMMUNITY): Payer: Self-pay

## 2018-03-07 ENCOUNTER — Encounter (HOSPITAL_BASED_OUTPATIENT_CLINIC_OR_DEPARTMENT_OTHER)
Admission: RE | Admit: 2018-03-07 | Discharge: 2018-03-07 | Disposition: A | Discharge status: Home / Self Care | Payer: BLUE CROSS/BLUE SHIELD | Source: Ambulatory Visit | Attending: Cardiology | Admitting: Cardiology

## 2018-03-07 ENCOUNTER — Encounter (HOSPITAL_COMMUNITY)
Admission: RE | Admit: 2018-03-07 | Discharge: 2018-03-07 | Disposition: A | Discharge status: Home / Self Care | Payer: BLUE CROSS/BLUE SHIELD | Source: Ambulatory Visit | Attending: Cardiology | Admitting: Cardiology

## 2018-03-07 DIAGNOSIS — I2510 Atherosclerotic heart disease of native coronary artery without angina pectoris: Secondary | ICD-10-CM

## 2018-03-07 LAB — NM MYOCAR MULTI W/SPECT W/WALL MOTION / EF
CHL CUP NUCLEAR SDS: 1
CHL CUP NUCLEAR SRS: 0
LHR: 0.47
LV dias vol: 43 mL (ref 46–106)
LVSYSVOL: 8 mL
NUC STRESS TID: 1.34
Peak HR: 150 {beats}/min
Rest HR: 83 {beats}/min
SSS: 1

## 2018-03-07 MED ORDER — DOBUTAMINE IN D5W 4-5 MG/ML-% IV SOLN
INTRAVENOUS | Status: AC
  Administered 2018-03-07: 1000 mg via INTRAVENOUS
  Filled 2018-03-07: qty 250

## 2018-03-07 MED ORDER — TECHNETIUM TC 99M TETROFOSMIN IV KIT
30.0000 | PACK | Freq: Once | INTRAVENOUS | Status: AC | PRN
  Administered 2018-03-07: 30 via INTRAVENOUS

## 2018-03-07 MED ORDER — TECHNETIUM TC 99M TETROFOSMIN IV KIT
10.0000 | PACK | Freq: Once | INTRAVENOUS | Status: AC | PRN
  Administered 2018-03-07: 10 via INTRAVENOUS

## 2018-03-09 ENCOUNTER — Telehealth: Payer: Self-pay

## 2018-03-09 NOTE — Telephone Encounter (Signed)
Patient notified. Routed to PCP 

## 2018-03-09 NOTE — Telephone Encounter (Signed)
-----   Message from Merlene Laughter, LPN sent at 9/32/6712  8:38 AM EDT -----   ----- Message ----- From: Satira Sark, MD Sent: 03/08/2018   8:18 AM To: Merlene Laughter, LPN  Results reviewed.  Please let her know that the stress test was reassuring, overall low risk.  This would argue against any major obstructive CAD.  Since she does have coronary artery calcification by chest CT imaging, recommend baby aspirin daily, would also get lipid panel with PCP and consider statin therapy.  We can see her back in 1 year. A copy of this test should be forwarded to [name], MD.

## 2018-03-15 ENCOUNTER — Other Ambulatory Visit: Payer: Self-pay

## 2018-03-15 ENCOUNTER — Telehealth: Payer: Self-pay | Admitting: Cardiology

## 2018-03-15 ENCOUNTER — Ambulatory Visit (INDEPENDENT_AMBULATORY_CARE_PROVIDER_SITE_OTHER): Payer: BLUE CROSS/BLUE SHIELD

## 2018-03-15 DIAGNOSIS — R0602 Shortness of breath: Secondary | ICD-10-CM | POA: Diagnosis not present

## 2018-03-15 NOTE — Telephone Encounter (Signed)
LMTCB

## 2018-03-15 NOTE — Telephone Encounter (Signed)
Emily Pope called today stating that she was told she needs to have lab work . Please verify and call patient.

## 2018-03-15 NOTE — Telephone Encounter (Signed)
Returned call

## 2018-03-16 ENCOUNTER — Telehealth: Payer: Self-pay

## 2018-03-16 NOTE — Telephone Encounter (Signed)
-----   Message from Laurine Blazer, LPN sent at 0/37/5436  4:12 PM EDT -----   ----- Message ----- From: Waynetta Pean Sent: 03/15/2018   5:16 PM To: Bernita Raisin, RN  Covering for Dr. Domenic Polite - Please let the patient know her echocardiogram showed normal pumping function of the heart with a preserved EF of 60-65%. She did have mild abnormal relaxation of the heart which is very common to see and is likely secondary to hypertension. Would continue to follow BP closely. No significant valve abnormalities. Please forward a copy of this to [name], MD. Thank you.

## 2018-03-16 NOTE — Telephone Encounter (Signed)
Patient notified

## 2018-03-16 NOTE — Telephone Encounter (Signed)
Patient notified. Routed to PCP 

## 2018-03-21 ENCOUNTER — Telehealth (HOSPITAL_COMMUNITY): Payer: Self-pay | Admitting: *Deleted

## 2018-03-22 ENCOUNTER — Encounter (HOSPITAL_COMMUNITY): Payer: Self-pay | Admitting: Adult Health

## 2018-03-22 ENCOUNTER — Other Ambulatory Visit (HOSPITAL_COMMUNITY): Payer: Self-pay | Admitting: Adult Health

## 2018-03-22 DIAGNOSIS — C3411 Malignant neoplasm of upper lobe, right bronchus or lung: Secondary | ICD-10-CM

## 2018-03-22 MED ORDER — TRAMADOL HCL 50 MG PO TABS: ORAL_TABLET | ORAL | 0 refills | Status: DC

## 2018-03-22 NOTE — Telephone Encounter (Signed)
Rx e-scribed to [facility] per pt request.   Mike Craze, NP [place] [phone]

## 2018-03-22 NOTE — Progress Notes (Signed)
Patient called cancer center requesting refill of Tramadol.   [place] Controlled Substance Reporting System reviewed and refill is appropriate on or after 03/22/18. Medication e-scribed to her pharmacy [facility]) using [employer]'s 2-step verification process.    NCCSRS reviewed:     Mike Craze, NP [place] [phone]

## 2018-03-31 ENCOUNTER — Inpatient Hospital Stay (HOSPITAL_COMMUNITY): Payer: BLUE CROSS/BLUE SHIELD

## 2018-03-31 ENCOUNTER — Inpatient Hospital Stay (HOSPITAL_COMMUNITY): Payer: BLUE CROSS/BLUE SHIELD | Attending: Internal Medicine | Admitting: Internal Medicine

## 2018-03-31 ENCOUNTER — Other Ambulatory Visit: Payer: Self-pay

## 2018-03-31 DIAGNOSIS — R05 Cough: Secondary | ICD-10-CM

## 2018-03-31 DIAGNOSIS — R911 Solitary pulmonary nodule: Secondary | ICD-10-CM

## 2018-03-31 DIAGNOSIS — Z95828 Presence of other vascular implants and grafts: Secondary | ICD-10-CM

## 2018-03-31 DIAGNOSIS — R0602 Shortness of breath: Secondary | ICD-10-CM | POA: Diagnosis not present

## 2018-03-31 DIAGNOSIS — K760 Fatty (change of) liver, not elsewhere classified: Secondary | ICD-10-CM | POA: Diagnosis not present

## 2018-03-31 DIAGNOSIS — R918 Other nonspecific abnormal finding of lung field: Secondary | ICD-10-CM

## 2018-03-31 DIAGNOSIS — C3411 Malignant neoplasm of upper lobe, right bronchus or lung: Secondary | ICD-10-CM

## 2018-03-31 MED ORDER — DEXAMETHASONE SODIUM PHOSPHATE 100 MG/10ML IJ SOLN
20.0000 mg | Freq: Once | INTRAMUSCULAR | Status: AC
  Administered 2018-03-31: 20 mg via INTRAVENOUS
  Filled 2018-03-31: qty 2

## 2018-03-31 MED ORDER — SODIUM CHLORIDE 0.9 % IV SOLN
INTRAVENOUS | Status: DC
  Administered 2018-03-31: 15:00:00 via INTRAVENOUS

## 2018-03-31 MED ORDER — HEPARIN SOD (PORK) LOCK FLUSH 100 UNIT/ML IV SOLN
500.0000 [IU] | Freq: Once | INTRAVENOUS | Status: AC
  Administered 2018-03-31: 500 [IU] via INTRAVENOUS
  Filled 2018-03-31: qty 5

## 2018-03-31 MED ORDER — SODIUM CHLORIDE 0.9% FLUSH
10.0000 mL | Freq: Once | INTRAVENOUS | Status: AC
  Administered 2018-03-31: 10 mL via INTRAVENOUS

## 2018-03-31 NOTE — Progress Notes (Signed)
Patient to treatment area for decadron IV and port flush verbal order Dr. Walden Field.  Patient ambulated to treatment area with no s/s of distress.  Patient stated she has noted more SOB prn but able to perform ADL's.  Port accessed with no difficulty and good blood return.   Patient tolerated decadron infusion with no complaints voiced.  Port site clean and dry with no bruising or swelling noted at site. Band aid applied.  VSS with discharge and left ambulatory with no s/s of distress noted.

## 2018-03-31 NOTE — Patient Instructions (Signed)
[place] at [facility]  Discharge Instructions:  You received decadron today and a port flush.  _______________________________________________________________  Thank you for choosing [place] at [facility] to provide your oncology and hematology care.  To afford each patient quality time with our providers, please arrive at least 15 minutes before your scheduled appointment.  You need to re-schedule your appointment if you arrive 10 or more minutes late.  We strive to give you quality time with our providers, and arriving late affects you and other patients whose appointments are after yours.  Also, if you no show three or more times for appointments you may be dismissed from the clinic.  Again, thank you for choosing [place] at [place] hope is that these requests will allow you access to exceptional care and in a timely manner. _______________________________________________________________  If you have questions after your visit, please contact our office at [phone] between the hours of 8:30 a.m. and 5:00 p.m. Voicemails left after 4:30 p.m. will not be returned until the following business day. _______________________________________________________________  For prescription refill requests, have your pharmacy contact our office. _______________________________________________________________  Recommendations made by the consultant and any test results will be sent to your referring physician. _______________________________________________________________

## 2018-03-31 NOTE — Patient Instructions (Addendum)
[place] at [facility] Discharge Instructions  Today you saw Dr Walden Field. We Will schedule PET Scan and 2 week follow-up appointment in the clinic for results.    Thank you for choosing [place] at [facility] to provide your oncology and hematology care.  To afford each patient quality time with our provider, please arrive at least 15 minutes before your scheduled appointment time.   If you have a lab appointment with the [place] please come in thru the  Main Entrance and check in at the main information desk  You need to re-schedule your appointment should you arrive 10 or more minutes late.  We strive to give you quality time with our providers, and arriving late affects you and other patients whose appointments are after yours.  Also, if you no show three or more times for appointments you may be dismissed from the clinic at the providers discretion.     Again, thank you for choosing [facility].  Our hope is that these requests will decrease the amount of time that you wait before being seen by our physicians.       _____________________________________________________________  Should you have questions after your visit to [facility], please contact our office at [phone] between the hours of 8:30 a.m. and 4:30 p.m.  Voicemails left after 4:30 p.m. will not be returned until the following business day.  For prescription refill requests, have your pharmacy contact our office.       Resources For Cancer Patients and their Caregivers ? American Cancer Society: Can assist with transportation, wigs, general needs, runs Look Good Feel Better.        681 147 3164 ? Cancer Care: Provides financial assistance, online support groups, medication/co-pay assistance.  1-800-813-HOPE 405 071 1749) ? [place] Assists [place] cancer patients and their families through emotional ,  educational and financial support.  (703) 093-9879 ? [place] DSS Where to apply for food stamps, Medicaid and utility assistance. 4322671212 ? [facility]: Transportation to medical appointments. 248-339-4823 ? Social Security Administration: May apply for disability if have a Stage IV cancer. (914) 732-0128 510-123-1841 ? [employer], Disability and Transit Services: Assists with nutrition, care and transit needs. [place] Support Programs:   > Cancer Support Group  2nd Tuesday of the month 1pm-2pm, Journey Room   > Creative Journey  3rd Tuesday of the month 1130am-1pm, Journey Room

## 2018-03-31 NOTE — Progress Notes (Signed)
Diagnosis Abnormal findings on diagnostic imaging of lung - Plan: NM PET Image Restag (PS) Skull Base To Thigh  Staging Cancer Staging Primary cancer of right upper lobe of lung ([place]) Staging form: Lung, AJCC 7th Edition - Clinical stage from 09/27/2016: Stage IIIB (T4, N3, M0) - Signed by Baird Cancer, PA-C on 09/27/2016   Assessment and Plan:  1. 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 S/P concomitant chemoradiation with carboplatin/paclitaxel (09/20/2016- 10/25/2016) followed by 2 cycle of consolidative chemotherapy with carboplatin/paclitaxel (11/15/2016- 12/06/2016). Pt was treated with consolidative durvalumab (Imfinzi) beginning on 01/26/2016 for up to 52 weeks. She developed Grade 2 pneumonitis due to imfinzi and the medication has been permanently discontinued from imfinzi due to the development of grade 2 pneumonitis twice in a row after imfinzi treatment. She remains on Prednisone 2.5 mg daily and is followed by Dr. Luan Pulling.    Patient has undergone CT chest w/o contrast on 01/30/2018 that showed  IMPRESSION: 1. Slight increase in right apical and anterior right lower lobe consolidation, architectural distortion, with similar calcification. Findings are most consistent with evolving radiation change. No convincing evidence of residual or recurrent disease. 2. No thoracic adenopathy. 3. Similar right lower lobe 7 mm of mercury nodule. 4. Age advanced coronary artery atherosclerosis. Recommend assessment of coronary risk factors and consideration of medical therapy. 5. Aortic atherosclerosis (ICD10-I70.0) and emphysema (ICD10-J43.9). 6. Hepatic steatosis. 7. Similar atypical position of Port-A-Cath.  Due to her ongoing respiratory complaints and in light of the pulmonary nodule in the right lower lung she will undergo a PET scan for further evaluation.  She will return to clinic in 2 weeks to go over the results.  She  will also get port flushed today and should continue flushes every 8 weeks.  2.  Cough and shortness of breath.  Pulse ox is 100% on room air.  CT angios done 02/03/2018 at [facility] [place] showed no PE.  The patient has a history of pneumonitis.  She is on chronic prednisone with the current dose of 2.5 mg daily.  She will be given Decadron 20 mg IV in the clinic today.  She should continue to follow-up with Dr. Luan Pulling as recommended.  She is set up for PET scan for further evaluation of some of the findings on recent CT of the chest.  3.  Fatty liver.  She had a mild elevation in liver function tests done in February 2019.  She is set up for PET scan and will return to clinic and have repeat labs in 2 weeks.  Current Status: 62 year old female seen today for follow-up.  She continues to complain of shortness of breath and a dry cough.  She reports she is followed by Dr. Luan Pulling.  She had a recent CT of the chest and is here to go over the results.  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  S/P concomitant chemoradiation with carboplatin/paclitaxel (09/20/2016- 10/25/2016) followed by 2 cycle of consolidative chemotherapy with carboplatin/paclitaxel (11/15/2016- 12/06/2016).  Unable to tolerate consolidative durvalumab (Imfinzi) beginning on 01/26/2016 and stopped 05/2017.   AND Imfinzi-induced pneumonitis, grade 2, resulting in holding Imfinzi from 04/25/2017- 05/09/2017 with corticosteroids treatment with taper.  This resolved/improved with steroids. Developed pnemonitis again after rechallenge with imfinzi. On steroid taper again.       Primary cancer of right upper lobe of lung ([place])   08/04/2016 PET scan  PET IMPRESSION: 1. 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 Mass  is confluent with the right superior hilum and right lower tracheal wall, suggestive of a T4 primary tumor. 2. Postobstructive pneumonia in the right upper lobe. 3. Hypermetabolic ipsilateral and contralateral mediastinal and contralateral hilar lymphadenopathy, suggestive of N3 nodal disease.      08/24/2016 Procedure    Bronchoscopy      08/25/2016 Pathology Results    Diagnosis Endobronchial biopsy, RUL - POORLY DIFFERENTIATED NON-SMALL CELL CARCINOMA. The immunophenotype is consistent with poorly differentiated adenocarcinoma.      09/10/2016 Imaging    MRI brain- No metastatic disease or acute intracranial abnormality.      09/20/2016 - 10/25/2016 Chemotherapy    Carboplatin/Paclitaxel weekly x 6 with XRT.      09/20/2016 - 10/25/2016 Radiation Therapy         11/15/2016 - 12/06/2016 Chemotherapy    Carboplatin/paclitaxel every 21 days x 2 cycles.      12/30/2016 Imaging    CT CAP- 1. Central right upper lobe lung mass has mildly decreased since 11/21/2016 chest CT angiogram and significantly decreased since 08/04/2016 PET-CT. 2. No pathologically enlarged thoracic lymph nodes. Previously described hypermetabolic thoracic adenopathy on the 08/04/2016 PET-CT has decreased in size. 3. Previously described bilateral lower lobe pulmonary nodules are stable in size. 4. New solitary subsolid 4 mm right middle lobe pulmonary nodule is nonspecific, and may be inflammatory. Recommend attention on a follow-up chest CT in 3 months. 5. No definite metastatic disease in the abdomen. Tiny sub 5 mm low-attenuation lesions in the liver are too small to characterize, favor benign. 6. Aortic atherosclerosis. One vessel coronary atherosclerosis. 7. Moderate emphysema.      01/25/2017 -  Chemotherapy    Durvalumab (Imfinzi) x 52 weeks.       03/31/2017 Imaging    CT chest with contrast: Stable size of partially calcified mass in the posterior right upper lobe.  Increased  airspace disease in paramediastinal right upper and superior right lower lobes. Differential diagnosis includes radiation pneumonitis, drug reaction, and infection.  Stable sub-cm right lower lobe pulmonary nodule.  No evidence of lymphadenopathy or pleural effusion.  Emphysema.  Aortic and coronary artery atherosclerosis.      04/25/2017 Adverse Reaction    Progressive cough, concerning for pneumonitis.      04/25/2017 Treatment Plan Change    Progressive cough concerning for pneumonitis.  Treatment held.  Managed by steroids.      05/09/2017 Treatment Plan Change    Restart Imfinzi with improvement of cough, Grade 1.      05/27/2017 Imaging    CT CHEST WITH CONTRAST IMPRESSION: 1. Similar size of partially calcified posterior right apical lung mass. 2. Similar to minimal increase in surrounding radiation fibrosis. 3. Subtle areas of mosaic attenuation are greater on the right. Likely attributed to mild ground-glass opacity. In the appropriate clinical setting, this could represent drug toxicity induced pneumonitis. 4.  Coronary artery atherosclerosis. Aortic atherosclerosis. 5. Similar right lower lobe pulmonary nodule.      05/27/2017 Adverse Reaction    Developed worsening cough and SOB again after imfinzi. Grade 2. Placed on long steroid taper.  Permanently discontinue any further imfinzi treatments.      07/29/2017 Imaging    CT chest/abd/pelvis: IMPRESSION: 1. Stable post treatment related changes of mass-like radiation fibrosis in the upper right lung with similar appearance of densely  calcified mass in the apex of the right upper lobe. No finding to suggest local recurrence of disease or metastatic disease in the chest, abdomen or pelvis on today's examination. 2. 7 mm subpleural nodule in the periphery of the right lower lobe is stable and favored to be benign. Continued attention on followup studies is recommended to ensure stability. 3. Aortic  atherosclerosis, in addition to left anterior descending coronary artery disease. Please note that although the presence of coronary artery calcium documents the presence of coronary artery disease, the severity of this disease and any potential stenosis cannot be assessed on this non-gated CT examination. Assessment for potential risk factor modification, dietary therapy or pharmacologic therapy may be warranted, if clinically indicated. 4. There are calcifications of the mitral valve. Echocardiographic correlation for evaluation of potential valvular dysfunction may be warranted if clinically indicated. 5. Mild diffuse bronchial wall thickening with mild to moderate centrilobular and mild paraseptal emphysema; imaging findings suggestive of underlying COPD. 6. Tip of left subclavian Port-A-Cath is now misdirected into the subclavian vein.        Problem List Patient Active Problem List   Diagnosis Date Noted  . Arthritis pain [M19.90] 10/28/2017  . Pneumonitis [J18.9] 05/09/2017  . 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 GERD (gastroesophageal reflux disease) [K21.9] 10/11/2016  . Primary cancer of right upper lobe of lung ([place]) [C34.11] 08/23/2016    Past Medical History Past Medical History:  Diagnosis Date  . Anxiety   . Arthritis   . Chronic bronchitis ([place])   . COPD (chronic obstructive pulmonary disease) ([place])   . Essential hypertension   . GERD (gastroesophageal reflux disease)   . Headache   . History of pneumonia   . Pneumonitis   . Primary cancer of right upper lobe of lung ([place])    Stage IIIb adenocarcinoma    Past Surgical History Past Surgical History:  Procedure Laterality Date  . ABDOMINAL HYSTERECTOMY    . APPENDECTOMY     when had hysterectomy  . CARPAL TUNNEL RELEASE Bilateral   . ENDOBRONCHIAL ULTRASOUND Bilateral 08/23/2016   Procedure: ENDOBRONCHIAL ULTRASOUND;  Surgeon: Collene Gobble, MD;  Location: [facility];  Service:  Cardiopulmonary;  Laterality: Bilateral;  . HERNIA REPAIR     left inguinal hernia age 19  . PORTACATH PLACEMENT Left 09/17/2016   Procedure: INSERTION PORT-A-CATH LEFT SUBCLAVIAN;  Surgeon: Aviva Signs, MD;  Location: [facility];  Service: General;  Laterality: Left;  . TUBAL LIGATION      Family History Family History  Problem Relation Age of Onset  . Hypertension Mother   . Lung cancer Mother   . Hypertension Father   . Diabetes Father   . Hypertension Sister   . Lung cancer Brother   . Hypertension Sister   . Colon cancer Neg Hx      Social History  reports that she quit smoking about 20 months ago. She has a 86.00 pack-year smoking history. She has never used smokeless tobacco. She reports that she does not drink alcohol or use drugs.  Medications  Current Outpatient Medications:  .  albuterol (PROVENTIL HFA;VENTOLIN HFA) 108 (90 Base) MCG/ACT inhaler, Inhale 2 puffs into the lungs every 6 (six) hours as needed for wheezing or shortness of breath., Disp: 1 Inhaler, Rfl: 6 .  aspirin EC 81 MG tablet, Take 81 mg by mouth daily., Disp: , Rfl:  .  buPROPion (WELLBUTRIN XL) 150 MG 24 hr tablet, Take 150 mg by mouth daily., Disp: , Rfl:  .  esomeprazole (NEXIUM) 20 MG capsule, Take 1 capsule (20 mg total) by mouth daily at 12 noon., Disp: 30 capsule, Rfl: 2 .  lidocaine-prilocaine (EMLA) cream, Apply 1 application topically as needed., Disp: 30 g, Rfl: 3 .  losartan-hydrochlorothiazide (HYZAAR) 50-12.5 MG tablet, , Disp: , Rfl: 1 .  meloxicam (MOBIC) 15 MG tablet, Take 1 tablet (15 mg total) by mouth daily., Disp: 30 tablet, Rfl: 2 .  predniSONE (DELTASONE) 5 MG tablet, Take 5 mg by mouth daily with breakfast., Disp: , Rfl:  .  traMADol (ULTRAM) 50 MG tablet, TAKE 1 TABLET BY MOUTH EVERY 6 HOURS AS NEEDED FOR  MODERATE  PAIN, Disp: 60 tablet, Rfl: 0 .  TRELEGY ELLIPTA 100-62.5-25 MCG/INH AEPB, Take 2 puffs by mouth daily., Disp: , Rfl: 0 .  vitamin B-12 (CYANOCOBALAMIN) 1000 MCG  tablet, Take 1,000 mcg by mouth daily., Disp: , Rfl:  No current facility-administered medications for this visit.   Facility-Administered Medications Ordered in Other Visits:  .  0.9 %  sodium chloride infusion, , Intravenous, Continuous, Vinia Jemmott, Mathis Dad, MD, Stopped at 03/31/18 1526  Allergies Clindamycin/lincomycin; Codeine; Lincomycin; Penicillins; Vancomycin; Hydrocodone; and Sulfa antibiotics  Review of Systems Review of Systems - Oncology ROS as per HPI otherwise 12 point ROS is negative other than SOB and fatigue   Physical Exam  Vitals Wt Readings from Last 3 Encounters:  03/31/18 138 lb 3.2 oz (62.7 kg)  03/02/18 140 lb (63.5 kg)  02/01/18 139 lb 12.8 oz (63.4 kg)   Temp Readings from Last 3 Encounters:  03/31/18 97.8 F (36.6 C) (Oral)  02/01/18 97.6 F (36.4 C) (Oral)  01/27/18 98 F (36.7 C) (Oral)   BP Readings from Last 3 Encounters:  03/31/18 (!) 142/81  03/02/18 (!) 142/72  02/01/18 132/75   Pulse Readings from Last 3 Encounters:  03/31/18 94  03/02/18 99  02/01/18 100   Constitutional: Well-developed, well-nourished, and in no distress.   HENT: Head: Normocephalic and atraumatic.  Mouth/Throat: No oropharyngeal exudate. Mucosa moist. Eyes: Pupils are equal, round, and reactive to light. Conjunctivae are normal. No scleral icterus.  Neck: Normal range of motion. Neck supple. No JVD present.  Cardiovascular: Normal rate, regular rhythm and normal heart sounds.  Exam reveals no gallop and no friction rub.   No murmur heard. Pulmonary/Chest: Coarse BS with wheezes noted bilaterally.   Abdominal: Soft. Bowel sounds are normal. No distension. There is no tenderness. There is no guarding.  Musculoskeletal: No edema or tenderness.  Lymphadenopathy: No cervical, axillary or supraclavicular adenopathy.  Neurological: Alert and oriented to person, place, and time. No cranial nerve deficit.  Skin: Skin is warm and dry. No rash noted. No erythema. No pallor.   Psychiatric: Affect and judgment normal.   Labs No visits with results within 3 Day(s) from this visit.  Latest known visit with results is:  Hospital Outpatient Visit on 03/07/2018  Component Date Value Ref Range Status  . Rest HR 03/07/2018 83  bpm Final  . Rest BP 03/07/2018 120/78  mmHg Final  . Peak HR 03/07/2018 150  bpm Final  . Peak BP 03/07/2018 126/73  mmHg Final  . SSS 03/07/2018 1   Final  . SRS 03/07/2018 0   Final  . SDS 03/07/2018 1   Final  . LHR 03/07/2018 0.47   Final  . TID 03/07/2018 1.34   Final  . LV sys vol 03/07/2018 8  mL Final  . LV dias vol 03/07/2018 43  46 - 106 mL Final  Pathology Orders Placed This Encounter  Procedures  . NM PET Image Restag (PS) Skull Base To Thigh    Standing Status:   Future    Standing Expiration Date:   03/31/2019    Order Specific Question:   If indicated for the ordered procedure, I authorize the administration of a radiopharmaceutical per Radiology protocol    Answer:   Yes    Order Specific Question:   Preferred imaging location?    Answer:   [facility]    Order Specific Question:   Radiology Contrast Protocol - do NOT remove file path    Answer:   [email or web address]       Zoila Shutter MD

## 2018-04-10 ENCOUNTER — Ambulatory Visit (HOSPITAL_COMMUNITY): Payer: BLUE CROSS/BLUE SHIELD

## 2018-04-18 ENCOUNTER — Ambulatory Visit (HOSPITAL_COMMUNITY): Payer: BLUE CROSS/BLUE SHIELD | Admitting: Internal Medicine

## 2018-04-24 ENCOUNTER — Ambulatory Visit (HOSPITAL_COMMUNITY)
Admission: RE | Admit: 2018-04-24 | Discharge: 2018-04-24 | Disposition: A | Discharge status: Home / Self Care | Payer: BLUE CROSS/BLUE SHIELD | Source: Ambulatory Visit | Attending: Internal Medicine | Admitting: Internal Medicine

## 2018-04-24 ENCOUNTER — Other Ambulatory Visit (HOSPITAL_COMMUNITY): Payer: BLUE CROSS/BLUE SHIELD

## 2018-04-24 DIAGNOSIS — R918 Other nonspecific abnormal finding of lung field: Secondary | ICD-10-CM | POA: Diagnosis present

## 2018-04-24 MED ORDER — FLUDEOXYGLUCOSE F - 18 (FDG) INJECTION
10.0000 | Freq: Once | INTRAVENOUS | Status: AC | PRN
  Administered 2018-04-24: 10.18 via INTRAVENOUS

## 2018-04-26 ENCOUNTER — Other Ambulatory Visit: Payer: Self-pay

## 2018-04-26 ENCOUNTER — Inpatient Hospital Stay (HOSPITAL_COMMUNITY): Payer: BLUE CROSS/BLUE SHIELD | Attending: Internal Medicine | Admitting: Internal Medicine

## 2018-04-26 ENCOUNTER — Inpatient Hospital Stay (HOSPITAL_COMMUNITY): Payer: BLUE CROSS/BLUE SHIELD

## 2018-04-26 VITALS — BP 127/76 | HR 95 | Temp 97.9°F | Resp 20 | Wt 134.7 lb

## 2018-04-26 DIAGNOSIS — C3411 Malignant neoplasm of upper lobe, right bronchus or lung: Secondary | ICD-10-CM | POA: Diagnosis not present

## 2018-04-26 DIAGNOSIS — R0602 Shortness of breath: Secondary | ICD-10-CM | POA: Diagnosis not present

## 2018-04-26 DIAGNOSIS — K760 Fatty (change of) liver, not elsewhere classified: Secondary | ICD-10-CM | POA: Insufficient documentation

## 2018-04-26 DIAGNOSIS — I10 Essential (primary) hypertension: Secondary | ICD-10-CM | POA: Insufficient documentation

## 2018-04-26 DIAGNOSIS — R05 Cough: Secondary | ICD-10-CM | POA: Diagnosis not present

## 2018-04-26 LAB — LACTATE DEHYDROGENASE: LDH: 170 U/L (ref 98–192)

## 2018-04-26 LAB — CBC WITH DIFFERENTIAL/PLATELET
Basophils Absolute: 0 10*3/uL (ref 0.0–0.1)
Basophils Relative: 1 %
Eosinophils Absolute: 0.1 10*3/uL (ref 0.0–0.7)
Eosinophils Relative: 2 %
HCT: 35.5 % — ABNORMAL LOW (ref 36.0–46.0)
Hemoglobin: 11.3 g/dL — ABNORMAL LOW (ref 12.0–15.0)
LYMPHS ABS: 0.7 10*3/uL (ref 0.7–4.0)
LYMPHS PCT: 12 %
MCH: 29.7 pg (ref 26.0–34.0)
MCHC: 31.8 g/dL (ref 30.0–36.0)
MCV: 93.2 fL (ref 78.0–100.0)
Monocytes Absolute: 0.3 10*3/uL (ref 0.1–1.0)
Monocytes Relative: 5 %
NEUTROS PCT: 80 %
Neutro Abs: 5.2 10*3/uL (ref 1.7–7.7)
Platelets: 362 10*3/uL (ref 150–400)
RBC: 3.81 MIL/uL — AB (ref 3.87–5.11)
RDW: 14 % (ref 11.5–15.5)
WBC: 6.4 10*3/uL (ref 4.0–10.5)

## 2018-04-26 LAB — COMPREHENSIVE METABOLIC PANEL
ALK PHOS: 87 U/L (ref 38–126)
ALT: 18 U/L (ref 14–54)
AST: 18 U/L (ref 15–41)
Albumin: 4.1 g/dL (ref 3.5–5.0)
Anion gap: 13 (ref 5–15)
BUN: 23 mg/dL — ABNORMAL HIGH (ref 6–20)
CALCIUM: 9.5 mg/dL (ref 8.9–10.3)
CO2: 25 mmol/L (ref 22–32)
CREATININE: 0.96 mg/dL (ref 0.44–1.00)
Chloride: 98 mmol/L — ABNORMAL LOW (ref 101–111)
Glucose, Bld: 102 mg/dL — ABNORMAL HIGH (ref 65–99)
Potassium: 3.9 mmol/L (ref 3.5–5.1)
Sodium: 136 mmol/L (ref 135–145)
Total Bilirubin: 0.5 mg/dL (ref 0.3–1.2)
Total Protein: 7.3 g/dL (ref 6.5–8.1)

## 2018-04-26 LAB — TSH: TSH: 1.884 u[IU]/mL (ref 0.350–4.500)

## 2018-04-26 NOTE — Progress Notes (Signed)
Diagnosis Malignant neoplasm of upper lobe of right lung ([place]) - Plan: CBC with Differential/Platelet, Comprehensive metabolic panel, Lactate dehydrogenase, TSH, NM PET Image Restag (PS) Skull Base To Thigh, CBC with Differential/Platelet, Comprehensive metabolic panel, Lactate dehydrogenase, TSH  Staging Cancer Staging Primary cancer of right upper lobe of lung ([place]) Staging form: Lung, AJCC 7th Edition - Clinical stage from 09/27/2016: Stage IIIB (T4, N3, M0) - Signed by Baird Cancer, PA-C on 09/27/2016   Assessment and Plan:  1. 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 S/P concomitant chemoradiation with carboplatin/paclitaxel (09/20/2016- 10/25/2016) followed by 2 cycle of consolidative chemotherapy with carboplatin/paclitaxel (11/15/2016- 12/06/2016). Pt was treated with consolidative durvalumab (Imfinzi) beginning on 01/26/2016 for up to 52 weeks. She developed Grade 2 pneumonitis due to imfinzi and the medication has been permanently discontinued from imfinzi due to the development of grade 2 pneumonitis twice in a row after imfinzi treatment. She remains on Prednisone 2.5 mg daily and is followed by Dr. Luan Pulling.    Patient has undergone CT chest w/o contrast on 01/30/2018 that showed  IMPRESSION: 1. Slight increase in right apical and anterior right lower lobe consolidation, architectural distortion, with similar calcification. Findings are most consistent with evolving radiation change. No convincing evidence of residual or recurrent disease. 2. No thoracic adenopathy. 3. Similar right lower lobe 7 mm of mercury nodule. 4. Age advanced coronary artery atherosclerosis. Recommend assessment of coronary risk factors and consideration of medical therapy. 5. Aortic atherosclerosis (ICD10-I70.0) and emphysema (ICD10-J43.9). 6. Hepatic steatosis. 7. Similar atypical position of Port-A-Cath.  Due to her ongoing respiratory  complaints and in light of the pulmonary nodule in the right lower lung she was set up for  PET scan for further evaluation.  Pet scan done 04/24/2018 showed IMPRESSION: 1. Uniform low metabolic activity associated with the RIGHT upper lobe consolidation at prior carcinoma treatment site is most consistent with benign post therapy inflammation. 2. No evidence of metastatic disease.  She will be set up for repeat PET scan in 4 months for ongoing follow-up.  She will RTC in 4 months to go over results  Continue Port flushes every 8 weeks.  Labs today WNL. TSH WNL.    2.  Cough and shortness of breath.  Pulse ox is 97% on room air.  CT angio chest  done 02/03/2018 at [facility] showed no PE.  The patient has a history of pneumonitis.  She is on chronic prednisone.  She should continue to follow-up with Dr. Luan Pulling as recommended.  Pet scan findings appear consistent with prior treatment.    3.  Fatty liver.  LFTs WNL on labs done today 04/26/2018.  Pet scan done 04/24/2018 showed no liver abnormalities.    4.  HTN.  BP 127/76.  Follow-up with PCP.    Current Status: 62 year old female seen today for follow-up.  She continues to complain of shortness of breath and a dry cough.  She reports she is followed by Dr. Luan Pulling.  She had a recent CT of the chest and is here to go over the results.  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  S/P concomitant chemoradiation with carboplatin/paclitaxel (09/20/2016- 10/25/2016) followed by 2 cycle of consolidative chemotherapy with carboplatin/paclitaxel (11/15/2016- 12/06/2016).  Unable to tolerate consolidative durvalumab (Imfinzi) beginning on 01/26/2016 and stopped 05/2017.   AND Imfinzi-induced pneumonitis, grade 2, resulting in holding Imfinzi from 04/25/2017- 05/09/2017 with corticosteroids  treatment with taper.  This resolved/improved with steroids. Developed pnemonitis again after  rechallenge with imfinzi. On steroid taper again.       Primary cancer of right upper lobe of lung ([place])   08/04/2016 PET scan    PET IMPRESSION: 1. 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 Mass is confluent with the right superior hilum and right lower tracheal wall, suggestive of a T4 primary tumor. 2. Postobstructive pneumonia in the right upper lobe. 3. Hypermetabolic ipsilateral and contralateral mediastinal and contralateral hilar lymphadenopathy, suggestive of N3 nodal disease.      08/24/2016 Procedure    Bronchoscopy      08/25/2016 Pathology Results    Diagnosis Endobronchial biopsy, RUL - POORLY DIFFERENTIATED NON-SMALL CELL CARCINOMA. The immunophenotype is consistent with poorly differentiated adenocarcinoma.      09/10/2016 Imaging    MRI brain- No metastatic disease or acute intracranial abnormality.      09/20/2016 - 10/25/2016 Chemotherapy    Carboplatin/Paclitaxel weekly x 6 with XRT.      09/20/2016 - 10/25/2016 Radiation Therapy         11/15/2016 - 12/06/2016 Chemotherapy    Carboplatin/paclitaxel every 21 days x 2 cycles.      12/30/2016 Imaging    CT CAP- 1. Central right upper lobe lung mass has mildly decreased since 11/21/2016 chest CT angiogram and significantly decreased since 08/04/2016 PET-CT. 2. No pathologically enlarged thoracic lymph nodes. Previously described hypermetabolic thoracic adenopathy on the 08/04/2016 PET-CT has decreased in size. 3. Previously described bilateral lower lobe pulmonary nodules are stable in size. 4. New solitary subsolid 4 mm right middle lobe pulmonary nodule is nonspecific, and may be inflammatory. Recommend attention on a follow-up chest CT in 3 months. 5. No definite metastatic disease in the abdomen. Tiny sub 5 mm low-attenuation lesions in the liver are too  small to characterize, favor benign. 6. Aortic atherosclerosis. One vessel coronary atherosclerosis. 7. Moderate emphysema.      01/25/2017 -  Chemotherapy    Durvalumab (Imfinzi) x 52 weeks.       03/31/2017 Imaging    CT chest with contrast: Stable size of partially calcified mass in the posterior right upper lobe.  Increased airspace disease in paramediastinal right upper and superior right lower lobes. Differential diagnosis includes radiation pneumonitis, drug reaction, and infection.  Stable sub-cm right lower lobe pulmonary nodule.  No evidence of lymphadenopathy or pleural effusion.  Emphysema.  Aortic and coronary artery atherosclerosis.      04/25/2017 Adverse Reaction    Progressive cough, concerning for pneumonitis.      04/25/2017 Treatment Plan Change    Progressive cough concerning for pneumonitis.  Treatment held.  Managed by steroids.      05/09/2017 Treatment Plan Change    Restart Imfinzi with improvement of cough, Grade 1.      05/27/2017 Imaging    CT CHEST WITH CONTRAST IMPRESSION: 1. Similar size of partially calcified posterior right apical lung mass. 2. Similar to minimal increase in surrounding radiation fibrosis. 3. Subtle areas of mosaic attenuation are greater on the right. Likely attributed to mild ground-glass opacity. In the appropriate clinical setting, this could represent drug toxicity induced pneumonitis. 4.  Coronary artery atherosclerosis. Aortic atherosclerosis. 5. Similar right lower lobe pulmonary nodule.      05/27/2017 Adverse Reaction    Developed worsening cough and SOB again after imfinzi. Grade 2. Placed on long  steroid taper.  Permanently discontinue any further imfinzi treatments.      07/29/2017 Imaging    CT chest/abd/pelvis: IMPRESSION: 1. Stable post treatment related changes of mass-like radiation fibrosis in the upper right lung with similar appearance of densely calcified mass in the apex of the right  upper lobe. No finding to suggest local recurrence of disease or metastatic disease in the chest, abdomen or pelvis on today's examination. 2. 7 mm subpleural nodule in the periphery of the right lower lobe is stable and favored to be benign. Continued attention on followup studies is recommended to ensure stability. 3. Aortic atherosclerosis, in addition to left anterior descending coronary artery disease. Please note that although the presence of coronary artery calcium documents the presence of coronary artery disease, the severity of this disease and any potential stenosis cannot be assessed on this non-gated CT examination. Assessment for potential risk factor modification, dietary therapy or pharmacologic therapy may be warranted, if clinically indicated. 4. There are calcifications of the mitral valve. Echocardiographic correlation for evaluation of potential valvular dysfunction may be warranted if clinically indicated. 5. Mild diffuse bronchial wall thickening with mild to moderate centrilobular and mild paraseptal emphysema; imaging findings suggestive of underlying COPD. 6. Tip of left subclavian Port-A-Cath is now misdirected into the subclavian vein.        Problem List Patient Active Problem List   Diagnosis Date Noted  . Arthritis pain [M19.90] 10/28/2017  . Pneumonitis [J18.9] 05/09/2017  . 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 GERD (gastroesophageal reflux disease) [K21.9] 10/11/2016  . Primary cancer of right upper lobe of lung ([place]) [C34.11] 08/23/2016    Past Medical History Past Medical History:  Diagnosis Date  . Anxiety   . Arthritis   . Chronic bronchitis ([place])   . COPD (chronic obstructive pulmonary disease) ([place])   . Essential hypertension   . GERD (gastroesophageal reflux disease)   . Headache   . History of pneumonia   . Pneumonitis   . Primary cancer of right upper lobe of lung ([place])    Stage IIIb adenocarcinoma    Past  Surgical History Past Surgical History:  Procedure Laterality Date  . ABDOMINAL HYSTERECTOMY    . APPENDECTOMY     when had hysterectomy  . CARPAL TUNNEL RELEASE Bilateral   . ENDOBRONCHIAL ULTRASOUND Bilateral 08/23/2016   Procedure: ENDOBRONCHIAL ULTRASOUND;  Surgeon: Collene Gobble, MD;  Location: [facility];  Service: Cardiopulmonary;  Laterality: Bilateral;  . HERNIA REPAIR     left inguinal hernia age 31  . PORTACATH PLACEMENT Left 09/17/2016   Procedure: INSERTION PORT-A-CATH LEFT SUBCLAVIAN;  Surgeon: Aviva Signs, MD;  Location: [facility];  Service: General;  Laterality: Left;  . TUBAL LIGATION      Family History Family History  Problem Relation Age of Onset  . Hypertension Mother   . Lung cancer Mother   . Hypertension Father   . Diabetes Father   . Hypertension Sister   . Lung cancer Brother   . Hypertension Sister   . Colon cancer Neg Hx      Social History  reports that she quit smoking about 21 months ago. She has a 86.00 pack-year smoking history. She has never used smokeless tobacco. She reports that she does not drink alcohol or use drugs.  Medications  Current Outpatient Medications:  .  albuterol (PROVENTIL HFA;VENTOLIN HFA) 108 (90 Base) MCG/ACT inhaler, Inhale 2 puffs into the lungs every 6 (six) hours as needed for wheezing or shortness of breath.,  Disp: 1 Inhaler, Rfl: 6 .  aspirin EC 81 MG tablet, Take 81 mg by mouth daily., Disp: , Rfl:  .  buPROPion (WELLBUTRIN XL) 150 MG 24 hr tablet, Take 150 mg by mouth daily., Disp: , Rfl:  .  esomeprazole (NEXIUM) 20 MG capsule, Take 1 capsule (20 mg total) by mouth daily at 12 noon., Disp: 30 capsule, Rfl: 2 .  lidocaine-prilocaine (EMLA) cream, Apply 1 application topically as needed., Disp: 30 g, Rfl: 3 .  losartan-hydrochlorothiazide (HYZAAR) 50-12.5 MG tablet, , Disp: , Rfl: 1 .  meloxicam (MOBIC) 15 MG tablet, Take 1 tablet (15 mg total) by mouth daily., Disp: 30 tablet, Rfl: 2 .  predniSONE (DELTASONE) 5 MG  tablet, Take 5 mg by mouth daily with breakfast., Disp: , Rfl:  .  traMADol (ULTRAM) 50 MG tablet, TAKE 1 TABLET BY MOUTH EVERY 6 HOURS AS NEEDED FOR  MODERATE  PAIN, Disp: 60 tablet, Rfl: 0 .  TRELEGY ELLIPTA 100-62.5-25 MCG/INH AEPB, Take 2 puffs by mouth daily., Disp: , Rfl: 0 .  vitamin B-12 (CYANOCOBALAMIN) 1000 MCG tablet, Take 1,000 mcg by mouth daily., Disp: , Rfl:   Allergies Clindamycin/lincomycin; Codeine; Lincomycin; Penicillins; Vancomycin; Hydrocodone; and Sulfa antibiotics  Review of Systems Review of Systems - Oncology ROS as per HPI otherwise 12 point ROS is negative.   Physical Exam  Vitals Wt Readings from Last 3 Encounters:  04/26/18 134 lb 11.2 oz (61.1 kg)  03/31/18 138 lb 3.2 oz (62.7 kg)  03/02/18 140 lb (63.5 kg)   Temp Readings from Last 3 Encounters:  04/26/18 97.9 F (36.6 C) (Oral)  03/31/18 97.8 F (36.6 C) (Oral)  02/01/18 97.6 F (36.4 C) (Oral)   BP Readings from Last 3 Encounters:  04/26/18 127/76  03/31/18 (!) 142/81  03/02/18 (!) 142/72   Pulse Readings from Last 3 Encounters:  04/26/18 95  03/31/18 94  03/02/18 99   Constitutional: Well-developed, well-nourished, and in no distress.   HENT: Head: Normocephalic and atraumatic.  Mouth/Throat: No oropharyngeal exudate. Mucosa moist. Eyes: Pupils are equal, round, and reactive to light. Conjunctivae are normal. No scleral icterus.  Neck: Normal range of motion. Neck supple. No JVD present.  Cardiovascular: Normal rate, regular rhythm and normal heart sounds.  Exam reveals no gallop and no friction rub.   No murmur heard. Pulmonary/Chest: Coarse BS.    Abdominal: Soft. Bowel sounds are normal. No distension. There is no tenderness. There is no guarding.  Musculoskeletal: No edema or tenderness.  Lymphadenopathy: No cervical, axillary or supraclavicular adenopathy.  Neurological: Alert and oriented to person, place, and time. No cranial nerve deficit.  Skin: Skin is warm and dry. No  rash noted. No erythema. No pallor.  Psychiatric: Affect and judgment normal.   Labs Appointment on 04/26/2018  Component Date Value Ref Range Status  . WBC 04/26/2018 6.4  4.0 - 10.5 K/uL Final  . RBC 04/26/2018 3.81* 3.87 - 5.11 MIL/uL Final  . Hemoglobin 04/26/2018 11.3* 12.0 - 15.0 g/dL Final  . HCT 04/26/2018 35.5* 36.0 - 46.0 % Final  . MCV 04/26/2018 93.2  78.0 - 100.0 fL Final  . MCH 04/26/2018 29.7  26.0 - 34.0 pg Final  . MCHC 04/26/2018 31.8  30.0 - 36.0 g/dL Final  . RDW 04/26/2018 14.0  11.5 - 15.5 % Final  . Platelets 04/26/2018 362  150 - 400 K/uL Final  . Neutrophils Relative % 04/26/2018 80  % Final  . Neutro Abs 04/26/2018 5.2  1.7 - 7.7 K/uL  Final  . Lymphocytes Relative 04/26/2018 12  % Final  . Lymphs Abs 04/26/2018 0.7  0.7 - 4.0 K/uL Final  . Monocytes Relative 04/26/2018 5  % Final  . Monocytes Absolute 04/26/2018 0.3  0.1 - 1.0 K/uL Final  . Eosinophils Relative 04/26/2018 2  % Final  . Eosinophils Absolute 04/26/2018 0.1  0.0 - 0.7 K/uL Final  . Basophils Relative 04/26/2018 1  % Final  . Basophils Absolute 04/26/2018 0.0  0.0 - 0.1 K/uL Final   Performed at [facility], [address] [place], [place] [address]  . Sodium 04/26/2018 136  135 - 145 mmol/L Final  . Potassium 04/26/2018 3.9  3.5 - 5.1 mmol/L Final  . Chloride 04/26/2018 98* 101 - 111 mmol/L Final  . CO2 04/26/2018 25  22 - 32 mmol/L Final  . Glucose, Bld 04/26/2018 102* 65 - 99 mg/dL Final  . BUN 04/26/2018 23* 6 - 20 mg/dL Final  . Creatinine, Ser 04/26/2018 0.96  0.44 - 1.00 mg/dL Final  . Calcium 04/26/2018 9.5  8.9 - 10.3 mg/dL Final  . Total Protein 04/26/2018 7.3  6.5 - 8.1 g/dL Final  . Albumin 04/26/2018 4.1  3.5 - 5.0 g/dL Final  . AST 04/26/2018 18  15 - 41 U/L Final  . ALT 04/26/2018 18  14 - 54 U/L Final  . Alkaline Phosphatase 04/26/2018 87  38 - 126 U/L Final  . Total Bilirubin 04/26/2018 0.5  0.3 - 1.2 mg/dL Final  . GFR calc non Af Amer 04/26/2018 >60  >60 mL/min Final   . GFR calc Af Amer 04/26/2018 >60  >60 mL/min Final   Comment: (NOTE) The eGFR has been calculated using the CKD EPI equation. This calculation has not been validated in all clinical situations. eGFR's persistently <60 mL/min signify possible Chronic Kidney Disease.   Georgiann Hahn gap 04/26/2018 13  5 - 15 Final   Performed at [facility], [address], [place], [place] [address]  . LDH 04/26/2018 170  98 - 192 U/L Final   Performed at [facility], [address] [place], [place] [address]  . TSH 04/26/2018 1.884  0.350 - 4.500 uIU/mL Final   Comment: Performed by a 3rd Generation assay with a functional sensitivity of <=0.01 uIU/mL. Performed at [facility], [address], [place],  [address]      Pathology Orders Placed This Encounter  Procedures  . NM PET Image Restag (PS) Skull Base To Thigh    Standing Status:   Future    Standing Expiration Date:   04/26/2019    Order Specific Question:   If indicated for the ordered procedure, I authorize the administration of a radiopharmaceutical per Radiology protocol    Answer:   Yes    Order Specific Question:   Preferred imaging location?    Answer:   [facility]    Order Specific Question:   Radiology Contrast Protocol - do NOT remove file path    Answer:   [email or web address]  . CBC with Differential/Platelet    Standing Status:   Future    Standing Expiration Date:   04/27/2019  . Comprehensive metabolic panel    Standing Status:   Future    Standing Expiration Date:   04/27/2019  . Lactate dehydrogenase    Standing Status:   Future    Standing Expiration Date:   04/27/2019  . TSH    Standing Status:   Future    Standing Expiration Date:   04/27/2019  .  CBC with Differential/Platelet    Standing Status:   Future    Number of Occurrences:   1    Standing Expiration Date:   04/27/2019  . Comprehensive metabolic panel    Standing Status:   Future    Number of Occurrences:   1    Standing  Expiration Date:   04/27/2019  . Lactate dehydrogenase    Standing Status:   Future    Number of Occurrences:   1    Standing Expiration Date:   04/27/2019  . TSH    Standing Status:   Future    Number of Occurrences:   1    Standing Expiration Date:   04/26/2019       Zoila Shutter MD

## 2018-04-26 NOTE — Patient Instructions (Signed)
[place] at [facility]  Discharge Instructions:  You were seen by Dr. Walden Field today.  You will have lab work scheduled for today.  You will be scheduled for a PET scan and blood work in 4 months and follow up visit with Dr. Walden Field.   _______________________________________________________________  Thank you for choosing [place] at [facility] to provide your oncology and hematology care.  To afford each patient quality time with our providers, please arrive at least 15 minutes before your scheduled appointment.  You need to re-schedule your appointment if you arrive 10 or more minutes late.  We strive to give you quality time with our providers, and arriving late affects you and other patients whose appointments are after yours.  Also, if you no show three or more times for appointments you may be dismissed from the clinic.  Again, thank you for choosing [place] at [place] hope is that these requests will allow you access to exceptional care and in a timely manner. _______________________________________________________________  If you have questions after your visit, please contact our office at [phone] between the hours of 8:30 a.m. and 5:00 p.m. Voicemails left after 4:30 p.m. will not be returned until the following business day. _______________________________________________________________  For prescription refill requests, have your pharmacy contact our office. _______________________________________________________________  Recommendations made by the consultant and any test results will be sent to your referring physician. _______________________________________________________________

## 2018-06-26 ENCOUNTER — Other Ambulatory Visit: Payer: Self-pay

## 2018-06-26 ENCOUNTER — Inpatient Hospital Stay (HOSPITAL_COMMUNITY): Payer: BLUE CROSS/BLUE SHIELD | Attending: Internal Medicine

## 2018-06-26 VITALS — BP 113/96 | HR 103 | Resp 22

## 2018-06-26 DIAGNOSIS — I7300 Raynaud's syndrome without gangrene: Secondary | ICD-10-CM | POA: Insufficient documentation

## 2018-06-26 DIAGNOSIS — Z452 Encounter for adjustment and management of vascular access device: Secondary | ICD-10-CM | POA: Diagnosis not present

## 2018-06-26 DIAGNOSIS — Z95828 Presence of other vascular implants and grafts: Secondary | ICD-10-CM

## 2018-06-26 DIAGNOSIS — Z87891 Personal history of nicotine dependence: Secondary | ICD-10-CM | POA: Insufficient documentation

## 2018-06-26 DIAGNOSIS — G7289 Other specified myopathies: Secondary | ICD-10-CM | POA: Insufficient documentation

## 2018-06-26 DIAGNOSIS — R05 Cough: Secondary | ICD-10-CM | POA: Insufficient documentation

## 2018-06-26 DIAGNOSIS — C3411 Malignant neoplasm of upper lobe, right bronchus or lung: Secondary | ICD-10-CM | POA: Insufficient documentation

## 2018-06-26 MED ORDER — HEPARIN SOD (PORK) LOCK FLUSH 100 UNIT/ML IV SOLN
500.0000 [IU] | Freq: Once | INTRAVENOUS | Status: AC
  Administered 2018-06-26: 500 [IU] via INTRAVENOUS

## 2018-06-26 MED ORDER — SODIUM CHLORIDE 0.9% FLUSH
10.0000 mL | INTRAVENOUS | Status: DC | PRN
  Administered 2018-06-26: 10 mL via INTRAVENOUS
  Filled 2018-06-26: qty 10

## 2018-06-26 NOTE — Progress Notes (Signed)
Emily Pope presented for Portacath access and flush.   Portacath located left chest wall accessed with  H 20 needle.  Good blood return present. Portacath flushed with 28ml NS and 500U/37ml Heparin and needle removed intact.  Procedure tolerated well and without incident.  Pt stable and discharged home ambulatory.  Pt stated that she is still experiencing terrible joint and muscle pain, from her lower back down. The pt was not scheduled to see a provider or another scan until September. The pt was given a sooner appointment with Dr. Raliegh Ip.

## 2018-07-13 ENCOUNTER — Other Ambulatory Visit: Payer: Self-pay

## 2018-07-13 ENCOUNTER — Inpatient Hospital Stay (HOSPITAL_COMMUNITY): Payer: BLUE CROSS/BLUE SHIELD

## 2018-07-13 ENCOUNTER — Inpatient Hospital Stay (HOSPITAL_BASED_OUTPATIENT_CLINIC_OR_DEPARTMENT_OTHER): Payer: BLUE CROSS/BLUE SHIELD | Admitting: Hematology

## 2018-07-13 ENCOUNTER — Encounter (HOSPITAL_COMMUNITY): Payer: Self-pay | Admitting: Hematology

## 2018-07-13 VITALS — BP 128/72 | HR 90 | Temp 97.8°F | Resp 18 | Wt 132.0 lb

## 2018-07-13 DIAGNOSIS — Z87891 Personal history of nicotine dependence: Secondary | ICD-10-CM | POA: Diagnosis not present

## 2018-07-13 DIAGNOSIS — R05 Cough: Secondary | ICD-10-CM | POA: Diagnosis not present

## 2018-07-13 DIAGNOSIS — C3411 Malignant neoplasm of upper lobe, right bronchus or lung: Secondary | ICD-10-CM

## 2018-07-13 DIAGNOSIS — G7289 Other specified myopathies: Secondary | ICD-10-CM

## 2018-07-13 DIAGNOSIS — I7300 Raynaud's syndrome without gangrene: Secondary | ICD-10-CM

## 2018-07-13 LAB — LACTATE DEHYDROGENASE: LDH: 158 U/L (ref 98–192)

## 2018-07-13 LAB — SEDIMENTATION RATE: Sed Rate: 40 mm/hr — ABNORMAL HIGH (ref 0–22)

## 2018-07-13 LAB — C-REACTIVE PROTEIN: CRP: 0.9 mg/dL (ref <1.0)

## 2018-07-13 LAB — CK: Total CK: 111 U/L (ref 38–234)

## 2018-07-13 MED ORDER — TRAMADOL HCL 50 MG PO TABS: ORAL_TABLET | ORAL | 0 refills | Status: DC

## 2018-07-13 MED ORDER — MELOXICAM 15 MG PO TABS
15.0000 mg | ORAL_TABLET | Freq: Every day | ORAL | 0 refills | Status: DC
Start: 2018-07-13 — End: 2018-08-16

## 2018-07-13 NOTE — Assessment & Plan Note (Addendum)
1.  Stage IIIb (T4 N3 M0) adenocarcinoma of the right upper lobe of the lung: -Diagnosed in August 2017, status post combination chemoradiation therapy with carboplatin and paclitaxel from 09/20/2016 through 10/25/2016, followed by 2 cycles of consolidative carboplatin/paclitaxel from 11/15/2016 through 12/06/2016 - Started on durvalumab consolidation on 01/26/2016, discontinued on 05/10/1999 7:18 treatment secondary to pneumonitis - PET/CT scan on 04/24/2018 shows uniform low metabolic activity associated with right upper lobe consolidation most consistent with benign post therapy inflammation with no evidence of metastatic disease.  We will continue periodic monitoring by scans.  2.  Pneumonitis: - She has developed severe pneumonitis which required prednisone 60 mg which was tapered multiple times.  Right now she is on 2.5 mg daily.  Her cough is improving but still there.  This is being managed by Dr. Luan Pulling.  Prednisone was tapered from 5 mg to 2.5 mg in May.  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  She was reportedly on meloxicam for 5 to 6 months, which was started when she started having musculoskeletal pains with Neulasta.  She reports hurting in the backs of her heels.  She also has some weakness and getting up from sitting position.  She has to climb down the stairs sideways. -She reportedly has Raynard's disease which was diagnosed 4 years ago. 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  I have offered her to increase the prednisone dose, but she was reluctant.  I have given meloxicam to be taken once a day for 30 pills.  We will do connective tissue disorder work-up including ESR, CRP, serum myoglobin, CPK, anti-Jo 1, anti-Ro, anti-La, anti-Smith, anti-RNP antibodies. - I have offered referral to a rheumatologist.  Patient is not willing to travel to  either [facility] or [facility].  Hence we will do the work-up here.  I would recommend increasing the dose of prednisone to better control these muscular pains.  Proximal muscle weakness could be both from myositis and prolonged steroid use.

## 2018-07-13 NOTE — Progress Notes (Signed)
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  BRIEF ONCOLOGIC HISTORY:  Oncology History   Stage IIIB (T4 N3 M0) carcinoma of RUL of lung, immunophenotyping consistent with adenocarcinoma.  S/P concomitant chemoradiation with carboplatin/paclitaxel (09/20/2016- 10/25/2016) followed by 2 cycle of consolidative chemotherapy with carboplatin/paclitaxel (11/15/2016- 12/06/2016).  Unable to tolerate consolidative durvalumab (Imfinzi) beginning on 01/26/2016 and stopped 05/2017.   AND Imfinzi-induced pneumonitis, grade 2, resulting in holding Imfinzi from 04/25/2017- 05/09/2017 with corticosteroids treatment with taper.  This resolved/improved with steroids. Developed pnemonitis again after rechallenge with imfinzi. On steroid taper again.       Primary cancer of right upper lobe of lung ([place])   08/04/2016 PET scan    PET IMPRESSION: 1. 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 Mass is confluent with the right superior hilum and right lower tracheal wall, suggestive of a T4 primary tumor. 2. Postobstructive pneumonia in the right upper lobe. 3. Hypermetabolic ipsilateral and contralateral mediastinal and contralateral hilar lymphadenopathy, suggestive of N3 nodal disease.      08/24/2016 Procedure    Bronchoscopy      08/25/2016 Pathology Results    Diagnosis Endobronchial biopsy, RUL - POORLY DIFFERENTIATED NON-SMALL CELL CARCINOMA. The immunophenotype is consistent with poorly differentiated adenocarcinoma.      09/10/2016 Imaging    MRI brain- No metastatic disease or acute  intracranial abnormality.      09/20/2016 - 10/25/2016 Chemotherapy    Carboplatin/Paclitaxel weekly x 6 with XRT.      09/20/2016 - 10/25/2016 Radiation Therapy         11/15/2016 - 12/06/2016 Chemotherapy    Carboplatin/paclitaxel every 21 days x 2 cycles.      12/30/2016 Imaging    CT CAP- 1. Central right upper lobe lung mass has mildly decreased since 11/21/2016 chest CT angiogram and significantly decreased since 08/04/2016 PET-CT. 2. No pathologically enlarged thoracic lymph nodes. Previously described hypermetabolic thoracic adenopathy on the 08/04/2016 PET-CT has decreased in size. 3. Previously described bilateral lower lobe pulmonary nodules are stable in size. 4. New solitary subsolid 4 mm right middle lobe pulmonary nodule is nonspecific, and may be inflammatory. Recommend attention on a follow-up chest CT in 3 months. 5. No definite metastatic disease in the abdomen. Tiny sub 5 mm low-attenuation lesions in the liver are too small to characterize, favor benign. 6. Aortic atherosclerosis. One vessel coronary atherosclerosis. 7. Moderate emphysema.      01/25/2017 -  Chemotherapy    Durvalumab (Imfinzi) x 52 weeks.       03/31/2017 Imaging    CT chest with contrast: Stable size of partially calcified mass in the posterior right upper lobe.  Increased airspace disease in paramediastinal right upper and superior right lower lobes. Differential diagnosis includes radiation pneumonitis, drug reaction, and infection.  Stable sub-cm right lower lobe pulmonary nodule.  No evidence of lymphadenopathy or pleural effusion.  Emphysema.  Aortic and coronary artery atherosclerosis.      04/25/2017 Adverse Reaction    Progressive cough, concerning for pneumonitis.      04/25/2017 Treatment Plan Change    Progressive cough concerning for pneumonitis.  Treatment held.  Managed by steroids.      05/09/2017 Treatment Plan Change    Restart Imfinzi with  improvement of cough, Grade 1.      05/27/2017 Imaging    CT CHEST WITH CONTRAST IMPRESSION: 1. Similar size of partially calcified posterior right apical lung mass. 2. Similar to minimal increase in surrounding radiation fibrosis. 3. Subtle areas of mosaic attenuation are greater on the right. Likely attributed to mild ground-glass opacity. In the appropriate clinical setting, this could represent drug toxicity induced pneumonitis. 4.  Coronary artery atherosclerosis. Aortic atherosclerosis. 5. Similar right lower lobe pulmonary nodule.      05/27/2017 Adverse Reaction    Developed worsening cough and SOB again after imfinzi. Grade 2. Placed on long steroid taper.  Permanently discontinue any further imfinzi treatments.      07/29/2017 Imaging    CT chest/abd/pelvis: IMPRESSION: 1. Stable post treatment related changes of mass-like radiation fibrosis in the upper right lung with similar appearance of densely calcified mass in the apex of the right upper lobe. No finding to suggest local recurrence of disease or metastatic disease in the chest, abdomen or pelvis on today's examination. 2. 7 mm subpleural nodule in the periphery of the right lower lobe is stable and favored to be benign. Continued attention on followup studies is recommended to ensure stability. 3. Aortic atherosclerosis, in addition to left anterior descending coronary artery disease. Please note that although the presence of coronary artery calcium documents the presence of coronary artery disease, the severity of this disease and any potential stenosis cannot be assessed on this non-gated CT examination. Assessment for potential risk factor modification, dietary therapy or pharmacologic therapy may be warranted, if clinically indicated. 4. There are calcifications of the mitral valve. Echocardiographic correlation for evaluation of potential valvular dysfunction may be warranted if clinically indicated. 5. Mild  diffuse bronchial wall thickening with mild to moderate centrilobular and mild paraseptal emphysema; imaging findings suggestive of underlying COPD. 6. Tip of left subclavian Port-A-Cath is now misdirected into the subclavian vein.        CANCER STAGING: Cancer Staging Primary cancer of right upper lobe of lung ([place]) Staging form: Lung, AJCC 7th Edition - Clinical stage from 09/27/2016: Stage IIIB (T4, N3, M0) - Signed by Baird Cancer, PA-C on 09/27/2016    INTERVAL HISTORY:  Ms. Fait 62 y.o. 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 Patient is here today to discuss her muscle and joint pain. The pain is mostly in her heels and other joints.Her pain started in April. She was given meloxicam to help with the pain. It helped but she is out of the medication and it bothering her again. The pain gets worse as the day goes on. Her legs get weak by the end of the day. She has difficulty standing from sitting position. She still has a cough and is taking prednisone 2.56m daily. This dose was reduced from 5 mg back in May. Denies any nausea, vomiting, or diarrhea.     REVIEW OF SYSTEMS:  Review of Systems  Constitutional: Positive for fatigue.  HENT:  Negative.   Eyes: Negative.   Respiratory: Positive for shortness of breath.   Cardiovascular: Negative.   Gastrointestinal: Negative.   Endocrine: Negative.   Genitourinary: Negative.    Skin: Negative.   Neurological: Positive for extremity weakness.  Psychiatric/Behavioral: Negative.      PAST MEDICAL/SURGICAL HISTORY:  Past Medical History:  Diagnosis Date  . Anxiety   .  Arthritis   . Chronic bronchitis ([place])   . COPD (chronic obstructive pulmonary disease) ([place])   . Essential hypertension   . GERD (gastroesophageal reflux disease)   . Headache   . History of pneumonia   . Pneumonitis   . Primary cancer of right upper lobe of lung ([place])    Stage IIIb  adenocarcinoma   Past Surgical History:  Procedure Laterality Date  . ABDOMINAL HYSTERECTOMY    . APPENDECTOMY     when had hysterectomy  . CARPAL TUNNEL RELEASE Bilateral   . ENDOBRONCHIAL ULTRASOUND Bilateral 08/23/2016   Procedure: ENDOBRONCHIAL ULTRASOUND;  Surgeon: Collene Gobble, MD;  Location: [facility];  Service: Cardiopulmonary;  Laterality: Bilateral;  . HERNIA REPAIR     left inguinal hernia age 57  . PORTACATH PLACEMENT Left 09/17/2016   Procedure: INSERTION PORT-A-CATH LEFT SUBCLAVIAN;  Surgeon: Aviva Signs, MD;  Location: [facility];  Service: General;  Laterality: Left;  . TUBAL LIGATION       SOCIAL HISTORY:  Social History   Socioeconomic History  . Marital status: Divorced    Spouse name: Not on file  . Number of children: Not on file  . Years of education: Not on file  . Highest education level: Not on file  Occupational History  . Not on file  Social Needs  . Financial resource strain: Not on file  . Food insecurity:    Worry: Not on file    Inability: Not on file  . Transportation needs:    Medical: Not on file    Non-medical: Not on file  Tobacco Use  . Smoking status: Former Smoker    Packs/day: 2.00    Years: 43.00    Pack years: 86.00    Last attempt to quit: 07/20/2016    Years since quitting: 1.9  . Smokeless tobacco: Never Used  Substance and Sexual Activity  . Alcohol use: No  . Drug use: No  . Sexual activity: Not on file  Lifestyle  . Physical activity:    Days per week: Not on file    Minutes per session: Not on file  . Stress: Not on file  Relationships  . Social connections:    Talks on phone: Not on file    Gets together: Not on file    Attends religious service: Not on file    Active member of club or organization: Not on file    Attends meetings of clubs or organizations: Not on file    Relationship status: Not on file  . Intimate partner violence:    Fear of current or ex partner: Not on file    Emotionally abused: Not  on file    Physically abused: Not on file    Forced sexual activity: Not on file  Other Topics Concern  . Not on file  Social History Narrative  . Not on file    FAMILY HISTORY:  Family History  Problem Relation Age of Onset  . Hypertension Mother   . Lung cancer Mother   . Hypertension Father   . Diabetes Father   . Hypertension Sister   . Lung cancer Brother   . Hypertension Sister   . Colon cancer Neg Hx     CURRENT MEDICATIONS:  Outpatient Encounter Medications as of 07/13/2018  Medication Sig  . albuterol (PROVENTIL HFA;VENTOLIN HFA) 108 (90 Base) MCG/ACT inhaler Inhale 2 puffs into the lungs every 6 (six) hours as needed for wheezing or shortness of breath.  Marland Kitchen aspirin  EC 81 MG tablet Take 81 mg by mouth daily.  Marland Kitchen buPROPion (WELLBUTRIN XL) 300 MG 24 hr tablet Take 300 mg by mouth daily.  Marland Kitchen docusate sodium (COLACE) 100 MG capsule Take 100 mg by mouth 2 (two) times daily.  Marland Kitchen lidocaine-prilocaine (EMLA) cream Apply 1 application topically as needed.  Marland Kitchen losartan-hydrochlorothiazide (HYZAAR) 50-12.5 MG tablet   . meloxicam (MOBIC) 15 MG tablet Take 1 tablet (15 mg total) by mouth daily.  . predniSONE (DELTASONE) 2.5 MG tablet   . traMADol (ULTRAM) 50 MG tablet TAKE 1 TABLET BY MOUTH EVERY 6 HOURS AS NEEDED FOR  MODERATE  PAIN  . TRELEGY ELLIPTA 100-62.5-25 MCG/INH AEPB Take 2 puffs by mouth daily.  . [DISCONTINUED] traMADol (ULTRAM) 50 MG tablet TAKE 1 TABLET BY MOUTH EVERY 6 HOURS AS NEEDED FOR  MODERATE  PAIN  . esomeprazole (NEXIUM) 20 MG capsule Take 1 capsule (20 mg total) by mouth daily at 12 noon. (Patient not taking: Reported on 07/13/2018)  . loratadine (CLARITIN) 10 MG tablet   . meloxicam (MOBIC) 15 MG tablet Take 1 tablet (15 mg total) by mouth daily.  . vitamin B-12 (CYANOCOBALAMIN) 1000 MCG tablet Take 1,000 mcg by mouth daily.   No facility-administered encounter medications on file as of 07/13/2018.     ALLERGIES:  Allergies  Allergen Reactions  .  Clindamycin/Lincomycin Rash  . Codeine Anaphylaxis  . Lincomycin Rash    Also Clindamycin as it has lincomycin in it Also Clindamycin as it has lincomycin in it  . Penicillins Anaphylaxis    Has patient had a PCN reaction causing immediate rash, facial/tongue/throat swelling, SOB or lightheadedness with hypotension: Unknown Has patient had a PCN reaction causing severe rash involving mucus membranes or skin necrosis: Unknown Has patient had a PCN reaction that required hospitalization: No Has patient had a PCN reaction occurring within the last 10 years: No If all of the above answers are "NO", then may proceed with Cephalosporin use. Has patient had a PCN reaction causing immediate rash, facial/tongue/throat swelling, SOB or lightheadedness with hypotension: Unknown Has patient had a PCN reaction causing severe rash involving mucus membranes or skin necrosis: Unknown Has patient had a PCN reaction that required hospitalization: No Has patient had a PCN reaction occurring within the last 10 years: No If all of the above answers are "NO", then may proceed with Cephalosporin use.   . Vancomycin Itching    Scalp itching  . Hydrocodone     RASH  . Sulfa Antibiotics Nausea Only     PHYSICAL EXAM:  ECOG Performance status: 1  Vitals:   07/13/18 0932  BP: 128/72  Pulse: 90  Resp: 18  Temp: 97.8 F (36.6 C)  SpO2: 99%   Filed Weights   07/13/18 0932  Weight: 132 lb (59.9 kg)    Physical Exam HEENT: Oropharynx has no thrush or other lesions. Chest: Bilateral clear to auscultation.  Occasional crackles. CVS: S1-S2 regular rate and rhythm. Abdomen: Soft nontender with no palpable abnormality. Lymphadenopathy: No palpable neck, supraclavicular and inguinal adenopathy. Extremities: No edema or cyanosis.  Muscle strength is 4 out of 5.  LABORATORY DATA:  I have reviewed the labs as listed.  CBC    Component Value Date/Time   WBC 6.4 04/26/2018 1434   RBC 3.81 (L) 04/26/2018  1434   HGB 11.3 (L) 04/26/2018 1434   HGB 12.9 09/02/2016 1403   HCT 35.5 (L) 04/26/2018 1434   HCT 38.4 09/02/2016 1403   PLT 362 04/26/2018 1434  PLT 476 (H) 09/02/2016 1403   MCV 93.2 04/26/2018 1434   MCV 94.6 09/02/2016 1403   MCH 29.7 04/26/2018 1434   MCHC 31.8 04/26/2018 1434   RDW 14.0 04/26/2018 1434   RDW 13.6 09/02/2016 1403   LYMPHSABS 0.7 04/26/2018 1434   LYMPHSABS 2.5 09/02/2016 1403   MONOABS 0.3 04/26/2018 1434   MONOABS 0.7 09/02/2016 1403   EOSABS 0.1 04/26/2018 1434   EOSABS 0.7 (H) 09/02/2016 1403   BASOSABS 0.0 04/26/2018 1434   BASOSABS 0.1 09/02/2016 1403   CMP Latest Ref Rng & Units 04/26/2018 02/01/2018 01/27/2018  Glucose 65 - 99 mg/dL 102(H) 117(H) 106(H)  BUN 6 - 20 mg/dL 23(H) 15 17  Creatinine 0.44 - 1.00 mg/dL 0.96 0.85 1.15(H)  Sodium 135 - 145 mmol/L 136 138 136  Potassium 3.5 - 5.1 mmol/L 3.9 4.1 4.0  Chloride 101 - 111 mmol/L 98(L) 101 98(L)  CO2 22 - 32 mmol/L _0 Calcium 8.9 - 10.3 mg/dL 9.5 9.1 9.1  Total Protein 6.5 - 8.1 g/dL 7.3 - 7.8  Total Bilirubin 0.3 - 1.2 mg/dL 0.5 - 0.6  Alkaline Phos 38 - 126 U/L 87 - 117  AST 15 - 41 U/L 18 - 40  ALT 14 - 54 U/L 18 - 57(H)       DIAGNOSTIC IMAGING:  I have independently reviewed PET CT scan dated 04/24/2018.      ASSESSMENT & PLAN:   Primary cancer of right upper lobe of lung ([place]) 1.  Stage IIIb (T4 N3 M0) adenocarcinoma of the right upper lobe of the lung: -Diagnosed in August 2017, status post combination chemoradiation therapy with carboplatin and paclitaxel from 09/20/2016 through 10/25/2016, followed by 2 cycles of consolidative carboplatin/paclitaxel from 11/15/2016 through 12/06/2016 - Started on durvalumab consolidation on 01/26/2016, discontinued on 05/10/1999 7:18 treatment secondary to pneumonitis - PET/CT scan on 04/24/2018 shows uniform low metabolic activity associated with right upper lobe consolidation most consistent with benign post therapy inflammation with no  evidence of metastatic disease.  We will continue periodic monitoring by scans.  2.  Pneumonitis: - She has developed severe pneumonitis which required prednisone 60 mg which was tapered multiple times.  Right now she is on 2.5 mg daily.  Her cough is improving but still there.  This is being managed by Dr. Luan Pulling.  Prednisone was tapered from 5 mg to 2.5 mg in May.  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  She was reportedly on meloxicam for 5 to 6 months, which was started when she started having musculoskeletal pains with Neulasta.  She reports hurting in the backs of her heels.  She also has some weakness and getting up from sitting position.  She has to climb down the stairs sideways. -She reportedly has Raynard's disease which was diagnosed 4 years ago. 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  I have offered her to increase the prednisone dose, but she was reluctant.  I have given meloxicam to be taken once a day for 30 pills.  We will do connective tissue disorder work-up including ESR, CRP, serum myoglobin, CPK, anti-Jo 1, anti-Ro, anti-La, anti-Smith, anti-RNP antibodies. - I have offered referral to a rheumatologist.  Patient is not willing to travel to either [facility] or [facility].  Hence we will do the work-up here.  I would recommend increasing the dose of prednisone  to better control these muscular pains.  Proximal muscle weakness could be both from myositis and prolonged steroid use.      Orders placed this encounter:  Orders Placed This Encounter  Procedures  . Sedimentation rate  . C-reactive protein  . Lactate dehydrogenase  . CK  . Angi-Jo 1 antibody, IgG  . Myositis Assessr Plus Jo-1 Autoabs  . Myoglobin, serum  . RNP Antibodies  . Sm and Sm/RNP Antibodies  . ANA, IFA (with reflex)      Derek Jack,  MD [place] [phone]

## 2018-07-13 NOTE — Patient Instructions (Signed)
[facility] Discharge Instructions  Today you saw Dr. K.   Thank you for choosing [facility] to provide your oncology and hematology care.  To afford each patient quality time with our provider, please arrive at least 15 minutes before your scheduled appointment time.   If you have a lab appointment with the Cancer Center please come in thru the  Main Entrance and check in at the main information desk  You need to re-schedule your appointment should you arrive 10 or more minutes late.  We strive to give you quality time with our providers, and arriving late affects you and other patients whose appointments are after yours.  Also, if you no show three or more times for appointments you may be dismissed from the clinic at the providers discretion.     Again, thank you for choosing [facility].  Our hope is that these requests will decrease the amount of time that you wait before being seen by our physicians.       _____________________________________________________________  Should you have questions after your visit to [facility], please contact our office at [phone] between the hours of 8:30 a.m. and 4:30 p.m.  Voicemails left after 4:30 p.m. will not be returned until the following business day.  For prescription refill requests, have your pharmacy contact our office.       Resources For Cancer Patients and their Caregivers ? American Cancer Society: Can assist with transportation, wigs, general needs, runs Look Good Feel Better.        1-888-227-6333 ? Cancer Care: Provides financial assistance, online support groups, medication/co-pay assistance.  1-800-813-HOPE (4673) ? [facility] Assists [place] cancer patients and their families through emotional , educational and financial support.  336-427-4357 ? [place] DSS Where to apply for food  stamps, Medicaid and utility assistance. 336-342-1394 ? [facility]: Transportation to medical appointments. 336-347-2287 ? Social Security Administration: May apply for disability if have a Stage IV cancer. 336-342-7796 1-800-772-1213 ? [place] Aging, Disability and Transit Services: Assists with nutrition, care and transit needs. 336-349-2343  Cancer Center Support Programs:   > Cancer Support Group  2nd Tuesday of the month 1pm-2pm, Journey Room   > Creative Journey  3rd Tuesday of the month 1130am-1pm, Journey Room    

## 2018-07-14 LAB — MYOGLOBIN, SERUM: Myoglobin: 30 ng/mL (ref 25–58)

## 2018-07-14 LAB — ANTI-JO 1 ANTIBODY, IGG: Anti JO-1: <0.2 AI (ref 0.0–0.9)

## 2018-07-15 LAB — ANTINUCLEAR ANTIBODIES, IFA: ANTINUCLEAR ANTIBODIES, IFA: NEGATIVE

## 2018-07-19 ENCOUNTER — Telehealth (HOSPITAL_COMMUNITY): Payer: Self-pay | Admitting: *Deleted

## 2018-07-19 NOTE — Telephone Encounter (Signed)
Pt called asking if it is ok to take Flomax. Another doctor would like her to start taking this but doesn't want to give it to her with out Dr. Marthann Schiller ok. Please advise!

## 2018-07-24 NOTE — Telephone Encounter (Signed)
-----   Message from [place], NP-C sent at 07/20/2018  5:11 PM EDT ----- Dr. Raliegh Ip said it was Faythe Ghee for  Albena Greenwell  to take Flowmax. I couldn't respond back on the other message u sent bc it wouldn't let me,

## 2018-07-24 NOTE — Telephone Encounter (Signed)
Pt aware that she can take Flomax per Dr. Delton Coombes.

## 2018-07-27 ENCOUNTER — Inpatient Hospital Stay (HOSPITAL_COMMUNITY): Payer: BLUE CROSS/BLUE SHIELD | Attending: Internal Medicine | Admitting: Hematology

## 2018-07-27 ENCOUNTER — Encounter (HOSPITAL_COMMUNITY): Payer: Self-pay | Admitting: Hematology

## 2018-07-27 ENCOUNTER — Other Ambulatory Visit: Payer: Self-pay

## 2018-07-27 DIAGNOSIS — R0609 Other forms of dyspnea: Secondary | ICD-10-CM | POA: Diagnosis not present

## 2018-07-27 DIAGNOSIS — M791 Myalgia, unspecified site: Secondary | ICD-10-CM | POA: Diagnosis not present

## 2018-07-27 DIAGNOSIS — G7289 Other specified myopathies: Secondary | ICD-10-CM | POA: Insufficient documentation

## 2018-07-27 DIAGNOSIS — C3411 Malignant neoplasm of upper lobe, right bronchus or lung: Secondary | ICD-10-CM | POA: Diagnosis not present

## 2018-07-27 DIAGNOSIS — Z87891 Personal history of nicotine dependence: Secondary | ICD-10-CM | POA: Diagnosis not present

## 2018-07-27 DIAGNOSIS — R5383 Other fatigue: Secondary | ICD-10-CM | POA: Diagnosis not present

## 2018-07-27 NOTE — Progress Notes (Signed)
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  S/P concomitant chemoradiation with carboplatin/paclitaxel (09/20/2016- 10/25/2016) followed by 2 cycle of consolidative chemotherapy with carboplatin/paclitaxel (11/15/2016- 12/06/2016).  Unable to tolerate consolidative durvalumab (Imfinzi) beginning on 01/26/2016 and stopped 05/2017.   AND Imfinzi-induced pneumonitis, grade 2, resulting in holding Imfinzi from 04/25/2017- 05/09/2017 with corticosteroids treatment with taper.  This resolved/improved with steroids. Developed pnemonitis again after rechallenge with imfinzi. On steroid taper again.       Primary cancer of right upper lobe of lung ([place]   08/04/2016 PET scan    PET IMPRESSION: 1. 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 Mass is confluent with the right superior hilum and right lower tracheal wall, suggestive of a T4 primary tumor. 2. Postobstructive pneumonia in the right upper lobe. 3. Hypermetabolic ipsilateral and contralateral mediastinal and contralateral hilar lymphadenopathy, suggestive of N3 nodal disease.      08/24/2016 Procedure    Bronchoscopy      08/25/2016 Pathology Results    Diagnosis Endobronchial biopsy, RUL - POORLY DIFFERENTIATED NON-SMALL CELL CARCINOMA. The immunophenotype is consistent with poorly differentiated adenocarcinoma.      09/10/2016 Imaging    MRI brain- No metastatic disease or acute  intracranial abnormality.      09/20/2016 - 10/25/2016 Chemotherapy    Carboplatin/Paclitaxel weekly x 6 with XRT.      09/20/2016 - 10/25/2016 Radiation Therapy         11/15/2016 - 12/06/2016 Chemotherapy    Carboplatin/paclitaxel every 21 days x 2 cycles.      12/30/2016 Imaging    CT CAP- 1. Central right upper lobe lung mass has mildly decreased since 11/21/2016 chest CT angiogram and significantly decreased since 08/04/2016 PET-CT. 2. No pathologically enlarged thoracic lymph nodes. Previously described hypermetabolic thoracic adenopathy on the 08/04/2016 PET-CT has decreased in size. 3. Previously described bilateral lower lobe pulmonary nodules are stable in size. 4. New solitary subsolid 4 mm right middle lobe pulmonary nodule is nonspecific, and may be inflammatory. Recommend attention on a follow-up chest CT in 3 months. 5. No definite metastatic disease in the abdomen. Tiny sub 5 mm low-attenuation lesions in the liver are too small to characterize, favor benign. 6. Aortic atherosclerosis. One vessel coronary atherosclerosis. 7. Moderate emphysema.      01/25/2017 -  Chemotherapy    Durvalumab (Imfinzi) x 52 weeks.       03/31/2017 Imaging    CT chest with contrast: Stable size of partially calcified mass in the posterior right upper lobe.  Increased airspace disease in paramediastinal right upper and superior right lower lobes. Differential diagnosis includes radiation pneumonitis, drug reaction, and infection.  Stable sub-cm right lower lobe pulmonary nodule.  No evidence of lymphadenopathy or pleural effusion.  Emphysema.  Aortic and coronary artery atherosclerosis.      04/25/2017 Adverse Reaction    Progressive cough, concerning for pneumonitis.      04/25/2017 Treatment Plan Change    Progressive cough concerning for pneumonitis.  Treatment held.  Managed by steroids.      05/09/2017 Treatment Plan Change    Restart Imfinzi with  improvement of cough, Grade 1.      05/27/2017 Imaging    CT CHEST WITH CONTRAST IMPRESSION: 1. Similar size of partially calcified posterior right apical lung mass. 2. Similar to minimal increase in surrounding radiation fibrosis. 3. Subtle areas of mosaic attenuation are greater on the right. Likely attributed to mild ground-glass opacity. In the appropriate clinical setting, this could represent drug toxicity induced pneumonitis. 4.  Coronary artery atherosclerosis. Aortic atherosclerosis. 5. Similar right lower lobe pulmonary nodule.      05/27/2017 Adverse Reaction    Developed worsening cough and SOB again after imfinzi. Grade 2. Placed on long steroid taper.  Permanently discontinue any further imfinzi treatments.      07/29/2017 Imaging    CT chest/abd/pelvis: IMPRESSION: 1. Stable post treatment related changes of mass-like radiation fibrosis in the upper right lung with similar appearance of densely calcified mass in the apex of the right upper lobe. No finding to suggest local recurrence of disease or metastatic disease in the chest, abdomen or pelvis on today's examination. 2. 7 mm subpleural nodule in the periphery of the right lower lobe is stable and favored to be benign. Continued attention on followup studies is recommended to ensure stability. 3. Aortic atherosclerosis, in addition to left anterior descending coronary artery disease. Please note that although the presence of coronary artery calcium documents the presence of coronary artery disease, the severity of this disease and any potential stenosis cannot be assessed on this non-gated CT examination. Assessment for potential risk factor modification, dietary therapy or pharmacologic therapy may be warranted, if clinically indicated. 4. There are calcifications of the mitral valve. Echocardiographic correlation for evaluation of potential valvular dysfunction may be warranted if clinically indicated. 5. Mild  diffuse bronchial wall thickening with mild to moderate centrilobular and mild paraseptal emphysema; imaging findings suggestive of underlying COPD. 6. Tip of left subclavian Port-A-Cath is now misdirected into the subclavian vein.        CANCER STAGING: Cancer Staging Primary cancer of right upper lobe of lung ([facility]) Staging form: Lung, AJCC 7th Edition - Clinical stage from 09/27/2016: Stage IIIB (T4, N3, M0) - Signed by Baird Cancer, PA-C on 09/27/2016    INTERVAL HISTORY:  Ms. Bobst 62 y.o. female returns for routine follow-up for right lung cancer. Patient is here today still having the muscle pains. She can go to sleep with no pains and wake up and feel like she has pulled a muscle. She is still taking prednisone daily. Patient is getting daily exercise. She walks up and down steps and does other exercise daily. She is taking meloxicam daily for the pain. Most of her pain is in her heels and other joints. She is stable on her feet and doesn't have balance issues. She is SOB and wheezy sometimes however is seeing her primary care MD for this. Denies any nausea, vomiting, or diarrhea. Appetite is 100% and energy levels are 50%.     REVIEW OF SYSTEMS:  Review of Systems  Constitutional: Positive for fatigue.  HENT:  Negative.   Eyes: Negative.   Respiratory: Negative.   Cardiovascular: Negative.   Gastrointestinal: Negative.   Endocrine: Negative.   Genitourinary: Negative.    Skin: Negative.   Neurological: Positive for dizziness and extremity weakness.  Hematological: Bruises/bleeds easily.  Psychiatric/Behavioral: Negative.      PAST MEDICAL/SURGICAL HISTORY:  Past Medical History:  Diagnosis  Date  . Anxiety   . Arthritis   . Chronic bronchitis ([facility])   . COPD (chronic obstructive pulmonary disease) ([facility])   . Essential hypertension   . GERD (gastroesophageal reflux disease)   . Headache   . History of pneumonia   . Pneumonitis   . Primary cancer of right  upper lobe of lung ([facility])    Stage IIIb adenocarcinoma   Past Surgical History:  Procedure Laterality Date  . ABDOMINAL HYSTERECTOMY    . APPENDECTOMY     when had hysterectomy  . CARPAL TUNNEL RELEASE Bilateral   . ENDOBRONCHIAL ULTRASOUND Bilateral 08/23/2016   Procedure: ENDOBRONCHIAL ULTRASOUND;  Surgeon: Collene Gobble, MD;  Location: [facility];  Service: Cardiopulmonary;  Laterality: Bilateral;  . HERNIA REPAIR     left inguinal hernia age 89  . PORTACATH PLACEMENT Left 09/17/2016   Procedure: INSERTION PORT-A-CATH LEFT SUBCLAVIAN;  Surgeon: Aviva Signs, MD;  Location: [facility];  Service: General;  Laterality: Left;  . TUBAL LIGATION       SOCIAL HISTORY:  Social History   Socioeconomic History  . Marital status: Divorced    Spouse name: Not on file  . Number of children: Not on file  . Years of education: Not on file  . Highest education level: Not on file  Occupational History  . Not on file  Social Needs  . Financial resource strain: Not on file  . Food insecurity:    Worry: Not on file    Inability: Not on file  . Transportation needs:    Medical: Not on file    Non-medical: Not on file  Tobacco Use  . Smoking status: Former Smoker    Packs/day: 2.00    Years: 43.00    Pack years: 86.00    Last attempt to quit: 07/20/2016    Years since quitting: 2.0  . Smokeless tobacco: Never Used  Substance and Sexual Activity  . Alcohol use: No  . Drug use: No  . Sexual activity: Not on file  Lifestyle  . Physical activity:    Days per week: Not on file    Minutes per session: Not on file  . Stress: Not on file  Relationships  . Social connections:    Talks on phone: Not on file    Gets together: Not on file    Attends religious service: Not on file    Active member of club or organization: Not on file    Attends meetings of clubs or organizations: Not on file    Relationship status: Not on file  . Intimate partner violence:    Fear of current or ex partner:  Not on file    Emotionally abused: Not on file    Physically abused: Not on file    Forced sexual activity: Not on file  Other Topics Concern  . Not on file  Social History Narrative  . Not on file    FAMILY HISTORY:  Family History  Problem Relation Age of Onset  . Hypertension Mother   . Lung cancer Mother   . Hypertension Father   . Diabetes Father   . Hypertension Sister   . Lung cancer Brother   . Hypertension Sister   . Colon cancer Neg Hx     CURRENT MEDICATIONS:  Outpatient Encounter Medications as of 07/27/2018  Medication Sig  . albuterol (PROVENTIL HFA;VENTOLIN HFA) 108 (90 Base) MCG/ACT inhaler Inhale 2 puffs into the lungs every 6 (six) hours as needed for wheezing  or shortness of breath.  Marland Kitchen aspirin EC 81 MG tablet Take 81 mg by mouth daily.  Marland Kitchen buPROPion (WELLBUTRIN XL) 300 MG 24 hr tablet Take 300 mg by mouth daily.  Marland Kitchen docusate sodium (COLACE) 100 MG capsule Take 100 mg by mouth 2 (two) times daily.  Marland Kitchen esomeprazole (NEXIUM) 20 MG capsule Take 1 capsule (20 mg total) by mouth daily at 12 noon.  . lidocaine-prilocaine (EMLA) cream Apply 1 application topically as needed.  Marland Kitchen losartan-hydrochlorothiazide (HYZAAR) 50-12.5 MG tablet   . meloxicam (MOBIC) 15 MG tablet Take 1 tablet (15 mg total) by mouth daily.  . meloxicam (MOBIC) 15 MG tablet Take 1 tablet (15 mg total) by mouth daily.  . predniSONE (DELTASONE) 2.5 MG tablet   . traMADol (ULTRAM) 50 MG tablet TAKE 1 TABLET BY MOUTH EVERY 6 HOURS AS NEEDED FOR  MODERATE  PAIN  . TRELEGY ELLIPTA 100-62.5-25 MCG/INH AEPB Take 2 puffs by mouth daily.  . vitamin B-12 (CYANOCOBALAMIN) 1000 MCG tablet Take 1,000 mcg by mouth daily.  Marland Kitchen loratadine (CLARITIN) 10 MG tablet    No facility-administered encounter medications on file as of 07/27/2018.     ALLERGIES:  Allergies  Allergen Reactions  . Clindamycin/Lincomycin Rash  . Codeine Anaphylaxis  . Lincomycin Rash    Also Clindamycin as it has lincomycin in it Also  Clindamycin as it has lincomycin in it  . Penicillins Anaphylaxis    Has patient had a PCN reaction causing immediate rash, facial/tongue/throat swelling, SOB or lightheadedness with hypotension: Unknown Has patient had a PCN reaction causing severe rash involving mucus membranes or skin necrosis: Unknown Has patient had a PCN reaction that required hospitalization: No Has patient had a PCN reaction occurring within the last 10 years: No If all of the above answers are "NO", then may proceed with Cephalosporin use. Has patient had a PCN reaction causing immediate rash, facial/tongue/throat swelling, SOB or lightheadedness with hypotension: Unknown Has patient had a PCN reaction causing severe rash involving mucus membranes or skin necrosis: Unknown Has patient had a PCN reaction that required hospitalization: No Has patient had a PCN reaction occurring within the last 10 years: No If all of the above answers are "NO", then may proceed with Cephalosporin use.   . Vancomycin Itching    Scalp itching  . Hydrocodone     RASH  . Sulfa Antibiotics Nausea Only     PHYSICAL EXAM:  ECOG Performance status: 1  Vitals:   07/27/18 1003  BP: 130/90  Pulse: 90  Resp: 20  Temp: 98.1 F (36.7 C)  SpO2: 100%   Filed Weights   07/27/18 1003  Weight: 131 lb 8 oz (59.6 kg)    Physical Exam  Constitutional: She is oriented to person, place, and time.  Cardiovascular: Normal rate, regular rhythm and normal heart sounds.  Pulmonary/Chest: Effort normal and breath sounds normal.  Neurological: She is alert and oriented to person, place, and time.  Skin: Skin is warm and dry.     LABORATORY DATA:  I have reviewed the labs as listed.  CBC    Component Value Date/Time   WBC 6.4 04/26/2018 1434   RBC 3.81 (L) 04/26/2018 1434   HGB 11.3 (L) 04/26/2018 1434   HGB 12.9 09/02/2016 1403   HCT 35.5 (L) 04/26/2018 1434   HCT 38.4 09/02/2016 1403   PLT 362 04/26/2018 1434   PLT 476 (H)  09/02/2016 1403   MCV 93.2 04/26/2018 1434   MCV 94.6 09/02/2016 1403  MCH 29.7 04/26/2018 1434   MCHC 31.8 04/26/2018 1434   RDW 14.0 04/26/2018 1434   RDW 13.6 09/02/2016 1403   LYMPHSABS 0.7 04/26/2018 1434   LYMPHSABS 2.5 09/02/2016 1403   MONOABS 0.3 04/26/2018 1434   MONOABS 0.7 09/02/2016 1403   EOSABS 0.1 04/26/2018 1434   EOSABS 0.7 (H) 09/02/2016 1403   BASOSABS 0.0 04/26/2018 1434   BASOSABS 0.1 09/02/2016 1403   CMP Latest Ref Rng & Units 04/26/2018 02/01/2018 01/27/2018  Glucose 65 - 99 mg/dL 102(H) 117(H) 106(H)  BUN 6 - 20 mg/dL 23(H) 15 17  Creatinine 0.44 - 1.00 mg/dL 0.96 0.85 1.15(H)  Sodium 135 - 145 mmol/L 136 138 136  Potassium 3.5 - 5.1 mmol/L 3.9 4.1 4.0  Chloride 101 - 111 mmol/L 98(L) 101 98(L)  CO2 22 - 32 mmol/L '25 25 23  ' Calcium 8.9 - 10.3 mg/dL 9.5 9.1 9.1  Total Protein 6.5 - 8.1 g/dL 7.3 - 7.8  Total Bilirubin 0.3 - 1.2 mg/dL 0.5 - 0.6  Alkaline Phos 38 - 126 U/L 87 - 117  AST 15 - 41 U/L 18 - 40  ALT 14 - 54 U/L 18 - 57(H)          ASSESSMENT & PLAN:   Primary cancer of right upper lobe of lung ([facility]) 1.  Stage IIIb (T4 N3 M0) adenocarcinoma of the right upper lobe of the lung: -Diagnosed in August 2017, status post combination chemoradiation therapy with carboplatin and paclitaxel from 09/20/2016 through 10/25/2016, followed by 2 cycles of consolidative carboplatin/paclitaxel from 11/15/2016 through 12/06/2016 - Started on durvalumab consolidation on 01/25/2017, discontinued on 05/09/2017 treatment secondary to pneumonitis - PET/CT scan on 04/24/2018 shows uniform low metabolic activity associated with right upper lobe consolidation most consistent with benign post therapy inflammation with no evidence of metastatic disease.  She will have next PET CT scan in the next few weeks and follow-up with me after that.  2.  Pneumonitis: - She has developed severe pneumonitis which required prednisone 60 mg which was tapered multiple times.  Right now she  is on 2.5 mg daily.  Her cough is improving but still there.  This is being managed by Dr. Luan Pulling.  Prednisone was tapered from 5 mg to 2.5 mg in May with the intention of stopping it completely in September.  She also uses pro-air and Trelegy Ellipta.  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  She was reportedly on meloxicam for 5 to 6 months, which was started when she started having musculoskeletal pains with Neulasta.  She reports hurting in the backs of her heels.  She also has some weakness and getting up from sitting position.  She has to climb down the stairs sideways. -She reportedly has Raynard's disease which was diagnosed 4 years ago. - We have offered rheumatology referral but patient refused.  I have done basic work-up including CPK, myoglobin, anti-Jo 1 antibody which were negative.  LDH was normal.  CRP was normal.  ESR was mildly elevated.  ANA was negative. 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  I have offered physical therapy evaluation and treatment.  Patient refused as she was doing a lot of exercises and doing household chores. -She will use meloxicam as needed.  She takes tramadol at nighttime.      Orders placed this encounter:  Orders Placed This Encounter  Procedures  . CBC  with Differential/Platelet  . Comprehensive metabolic panel  . Lactate dehydrogenase  . TSH      Derek Jack, [place] [phone]

## 2018-07-27 NOTE — Assessment & Plan Note (Signed)
1.  Stage IIIb (T4 N3 M0) adenocarcinoma of the right upper lobe of the lung: -Diagnosed in August 2017, status post combination chemoradiation therapy with carboplatin and paclitaxel from 09/20/2016 through 10/25/2016, followed by 2 cycles of consolidative carboplatin/paclitaxel from 11/15/2016 through 12/06/2016 - Started on durvalumab consolidation on 01/25/2017, discontinued on 05/09/2017 treatment secondary to pneumonitis - PET/CT scan on 04/24/2018 shows uniform low metabolic activity associated with right upper lobe consolidation most consistent with benign post therapy inflammation with no evidence of metastatic disease.  She will have next PET CT scan in the next few weeks and follow-up with me after that.  2.  Pneumonitis: - She has developed severe pneumonitis which required prednisone 60 mg which was tapered multiple times.  Right now she is on 2.5 mg daily.  Her cough is improving but still there.  This is being managed by Dr. Luan Pulling.  Prednisone was tapered from 5 mg to 2.5 mg in May with the intention of stopping it completely in September.  She also uses pro-air and Trelegy Ellipta.  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  She was reportedly on meloxicam for 5 to 6 months, which was started when she started having musculoskeletal pains with Neulasta.  She reports hurting in the backs of her heels.  She also has some weakness and getting up from sitting position.  She has to climb down the stairs sideways. -She reportedly has Raynard's disease which was diagnosed 4 years ago. - We have offered rheumatology referral but patient refused.  I have done basic work-up including CPK, myoglobin, anti-Jo 1 antibody which were negative.  LDH was normal.  CRP was normal.  ESR was mildly elevated.  ANA was negative. 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  I have offered  physical therapy evaluation and treatment.  Patient refused as she was doing a lot of exercises and doing household chores. -She will use meloxicam as needed.  She takes tramadol at nighttime.

## 2018-07-27 NOTE — Patient Instructions (Signed)
[facility] at [facility] Discharge Instructions  You were seen today by Dr. Katragadda   Thank you for choosing [facility] at [facility] to provide your oncology and hematology care.  To afford each patient quality time with our provider, please arrive at least 15 minutes before your scheduled appointment time.   If you have a lab appointment with the Cancer Center please come in thru the  Main Entrance and check in at the main information desk  You need to re-schedule your appointment should you arrive 10 or more minutes late.  We strive to give you quality time with our providers, and arriving late affects you and other patients whose appointments are after yours.  Also, if you no show three or more times for appointments you may be dismissed from the clinic at the providers discretion.     Again, thank you for choosing [facility].  Our hope is that these requests will decrease the amount of time that you wait before being seen by our physicians.       _____________________________________________________________  Should you have questions after your visit to [facility], please contact our office at [phone] between the hours of 8:00 a.m. and 4:30 p.m.  Voicemails left after 4:00 p.m. will not be returned until the following business day.  For prescription refill requests, have your pharmacy contact our office and allow 72 hours.    Cancer Center Support Programs:   > Cancer Support Group  2nd Tuesday of the month 1pm-2pm, Journey Room    

## 2018-08-14 ENCOUNTER — Encounter (HOSPITAL_COMMUNITY): Payer: Self-pay

## 2018-08-14 ENCOUNTER — Encounter (HOSPITAL_COMMUNITY)
Admission: RE | Admit: 2018-08-14 | Discharge: 2018-08-14 | Disposition: A | Discharge status: Home / Self Care | Payer: BLUE CROSS/BLUE SHIELD | Source: Ambulatory Visit | Attending: Internal Medicine | Admitting: Internal Medicine

## 2018-08-14 ENCOUNTER — Inpatient Hospital Stay (HOSPITAL_COMMUNITY): Payer: BLUE CROSS/BLUE SHIELD

## 2018-08-14 DIAGNOSIS — C3411 Malignant neoplasm of upper lobe, right bronchus or lung: Secondary | ICD-10-CM | POA: Diagnosis present

## 2018-08-14 LAB — COMPREHENSIVE METABOLIC PANEL
ALK PHOS: 97 U/L (ref 38–126)
ALT: 11 U/L (ref 0–44)
ANION GAP: 9 (ref 5–15)
AST: 14 U/L — ABNORMAL LOW (ref 15–41)
Albumin: 3.5 g/dL (ref 3.5–5.0)
BUN: 23 mg/dL (ref 8–23)
CHLORIDE: 106 mmol/L (ref 98–111)
CO2: 26 mmol/L (ref 22–32)
Calcium: 8.7 mg/dL — ABNORMAL LOW (ref 8.9–10.3)
Creatinine, Ser: 0.9 mg/dL (ref 0.44–1.00)
GFR calc non Af Amer: >60 mL/min (ref >60)
Glucose, Bld: 93 mg/dL (ref 70–99)
POTASSIUM: 4.1 mmol/L (ref 3.5–5.1)
SODIUM: 141 mmol/L (ref 135–145)
Total Bilirubin: 0.4 mg/dL (ref 0.3–1.2)
Total Protein: 7 g/dL (ref 6.5–8.1)

## 2018-08-14 LAB — CBC WITH DIFFERENTIAL/PLATELET
BASOS PCT: 0 %
Basophils Absolute: 0 10*3/uL (ref 0.0–0.1)
Eosinophils Absolute: 0.2 10*3/uL (ref 0.0–0.7)
Eosinophils Relative: 4 %
HEMATOCRIT: 30.2 % — AB (ref 36.0–46.0)
HEMOGLOBIN: 9.9 g/dL — AB (ref 12.0–15.0)
LYMPHS PCT: 16 %
Lymphs Abs: 0.9 10*3/uL (ref 0.7–4.0)
MCH: 31 pg (ref 26.0–34.0)
MCHC: 32.8 g/dL (ref 30.0–36.0)
MCV: 94.7 fL (ref 78.0–100.0)
MONOS PCT: 9 %
Monocytes Absolute: 0.5 10*3/uL (ref 0.1–1.0)
NEUTROS ABS: 3.9 10*3/uL (ref 1.7–7.7)
NEUTROS PCT: 71 %
Platelets: 366 10*3/uL (ref 150–400)
RBC: 3.19 MIL/uL — ABNORMAL LOW (ref 3.87–5.11)
RDW: 13.7 % (ref 11.5–15.5)
WBC: 5.5 10*3/uL (ref 4.0–10.5)

## 2018-08-14 LAB — LACTATE DEHYDROGENASE: LDH: 143 U/L (ref 98–192)

## 2018-08-14 LAB — TSH: TSH: 3.664 u[IU]/mL (ref 0.350–4.500)

## 2018-08-14 MED ORDER — SODIUM CHLORIDE 0.9% FLUSH
10.0000 mL | Freq: Once | INTRAVENOUS | Status: AC
  Administered 2018-08-14: 10 mL via INTRAVENOUS

## 2018-08-14 MED ORDER — FLUDEOXYGLUCOSE F - 18 (FDG) INJECTION
10.0000 | Freq: Once | INTRAVENOUS | Status: AC | PRN
  Administered 2018-08-14: 8.2 via INTRAVENOUS

## 2018-08-14 MED ORDER — HEPARIN SOD (PORK) LOCK FLUSH 100 UNIT/ML IV SOLN
500.0000 [IU] | Freq: Once | INTRAVENOUS | Status: AC
  Administered 2018-08-14: 500 [IU] via INTRAVENOUS

## 2018-08-14 NOTE — Patient Instructions (Signed)
[facility] at [facility]  Discharge Instructions:  Your port was flushed with labs drawn.  _______________________________________________________________  Thank you for choosing [facility] to provide your oncology and hematology care.  To afford each patient quality time with our providers, please arrive at least 15 minutes before your scheduled appointment.  You need to re-schedule your appointment if you arrive 10 or more minutes late.  We strive to give you quality time with our providers, and arriving late affects you and other patients whose appointments are after yours.  Also, if you no show three or more times for appointments you may be dismissed from the clinic.  Again, thank you for choosing [facility] [place] hope is that these requests will allow you access to exceptional care and in a timely manner. _______________________________________________________________  If you have questions after your visit, please contact our office at [phone] between the hours of 8:30 a.m. and 5:00 p.m. Voicemails left after 4:30 p.m. will not be returned until the following business day. _______________________________________________________________  For prescription refill requests, have your pharmacy contact our office. _______________________________________________________________  Recommendations made by the consultant and any test results will be sent to your referring physician. _______________________________________________________________

## 2018-08-14 NOTE — Progress Notes (Signed)
Port accessed for PET/CT today with labs drawn from port.  Good blood return noted.  No complaints of pain with flush.  Left ambulatory with no s/s of distress noted.    Patients port deaccessed after PET Ct injection of medication.  Good blood return noted.  Band aid applied.  Patient left ambulatory with no s/s of distress noted.

## 2018-08-16 ENCOUNTER — Other Ambulatory Visit: Payer: Self-pay

## 2018-08-16 ENCOUNTER — Inpatient Hospital Stay (HOSPITAL_BASED_OUTPATIENT_CLINIC_OR_DEPARTMENT_OTHER): Payer: BLUE CROSS/BLUE SHIELD | Admitting: Hematology

## 2018-08-16 ENCOUNTER — Encounter (HOSPITAL_COMMUNITY): Payer: Self-pay | Admitting: Hematology

## 2018-08-16 ENCOUNTER — Telehealth (HOSPITAL_COMMUNITY): Payer: Self-pay

## 2018-08-16 DIAGNOSIS — R0609 Other forms of dyspnea: Secondary | ICD-10-CM

## 2018-08-16 DIAGNOSIS — G7289 Other specified myopathies: Secondary | ICD-10-CM | POA: Diagnosis not present

## 2018-08-16 DIAGNOSIS — R5383 Other fatigue: Secondary | ICD-10-CM | POA: Diagnosis not present

## 2018-08-16 DIAGNOSIS — C3411 Malignant neoplasm of upper lobe, right bronchus or lung: Secondary | ICD-10-CM

## 2018-08-16 DIAGNOSIS — Z87891 Personal history of nicotine dependence: Secondary | ICD-10-CM

## 2018-08-16 MED ORDER — MELOXICAM 15 MG PO TABS: 15.0000 mg | ORAL_TABLET | Freq: Every day | ORAL | 0 refills | Status: DC

## 2018-08-16 MED ORDER — TRAMADOL HCL 50 MG PO TABS: ORAL_TABLET | ORAL | 0 refills | Status: DC

## 2018-08-16 NOTE — Progress Notes (Signed)
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  S/P concomitant chemoradiation with carboplatin/paclitaxel (09/20/2016- 10/25/2016) followed by 2 cycle of consolidative chemotherapy with carboplatin/paclitaxel (11/15/2016- 12/06/2016).  Unable to tolerate consolidative durvalumab (Imfinzi) beginning on 01/26/2016 and stopped 05/2017.   AND Imfinzi-induced pneumonitis, grade 2, resulting in holding Imfinzi from 04/25/2017- 05/09/2017 with corticosteroids treatment with taper.  This resolved/improved with steroids. Developed pnemonitis again after rechallenge with imfinzi. On steroid taper again.       Primary cancer of right upper lobe of lung ([place])   08/04/2016 PET scan    PET IMPRESSION: 1. 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 Mass is confluent with the right superior hilum and right lower tracheal wall, suggestive of a T4 primary tumor. 2. Postobstructive pneumonia in the right upper lobe. 3. Hypermetabolic ipsilateral and contralateral mediastinal and contralateral hilar lymphadenopathy, suggestive of N3 nodal disease.    08/24/2016 Procedure    Bronchoscopy    08/25/2016 Pathology Results    Diagnosis Endobronchial biopsy, RUL - POORLY DIFFERENTIATED NON-SMALL CELL CARCINOMA. The immunophenotype is consistent with poorly differentiated adenocarcinoma.    09/10/2016 Imaging    MRI brain- No metastatic disease or acute intracranial abnormality.    09/20/2016 - 10/25/2016 Chemotherapy    Carboplatin/Paclitaxel weekly x 6 with XRT.    09/20/2016 - 10/25/2016 Radiation Therapy       11/15/2016 - 12/06/2016 Chemotherapy    Carboplatin/paclitaxel every 21 days x 2 cycles.    12/30/2016 Imaging    CT CAP- 1. Central right upper lobe lung mass has mildly decreased since 11/21/2016 chest CT angiogram and significantly decreased since 08/04/2016 PET-CT. 2. No pathologically enlarged thoracic lymph nodes. Previously described hypermetabolic thoracic adenopathy on the 08/04/2016 PET-CT has decreased in size. 3. Previously described bilateral lower lobe pulmonary nodules are stable in size. 4. New solitary subsolid 4 mm right middle lobe pulmonary nodule is nonspecific, and may be inflammatory. Recommend attention on a follow-up chest CT in 3 months. 5. No definite metastatic disease in the abdomen. Tiny sub 5 mm low-attenuation lesions in the liver are too small to characterize, favor benign. 6. Aortic atherosclerosis. One vessel coronary atherosclerosis. 7. Moderate emphysema.    01/25/2017 -  Chemotherapy    Durvalumab (Imfinzi) x 52 weeks.     03/31/2017 Imaging    CT chest with contrast: Stable size of partially calcified mass in the posterior right upper lobe.  Increased airspace disease in paramediastinal right upper and superior right lower lobes. Differential diagnosis includes radiation pneumonitis, drug reaction, and infection.  Stable sub-cm right lower lobe pulmonary nodule.  No evidence of lymphadenopathy or pleural effusion.  Emphysema.  Aortic and coronary artery atherosclerosis.    04/25/2017 Adverse Reaction    Progressive cough, concerning for pneumonitis.    04/25/2017 Treatment Plan Change    Progressive cough concerning for pneumonitis.  Treatment held.  Managed by steroids.    05/09/2017 Treatment Plan Change    Restart Imfinzi with improvement of cough, Grade 1.    05/27/2017  Imaging    CT CHEST  WITH CONTRAST IMPRESSION: 1. Similar size of partially calcified posterior right apical lung mass. 2. Similar to minimal increase in surrounding radiation fibrosis. 3. Subtle areas of mosaic attenuation are greater on the right. Likely attributed to mild ground-glass opacity. In the appropriate clinical setting, this could represent drug toxicity induced pneumonitis. 4.  Coronary artery atherosclerosis. Aortic atherosclerosis. 5. Similar right lower lobe pulmonary nodule.    05/27/2017 Adverse Reaction    Developed worsening cough and SOB again after imfinzi. Grade 2. Placed on long steroid taper.  Permanently discontinue any further imfinzi treatments.    07/29/2017 Imaging    CT chest/abd/pelvis: IMPRESSION: 1. Stable post treatment related changes of mass-like radiation fibrosis in the upper right lung with similar appearance of densely calcified mass in the apex of the right upper lobe. No finding to suggest local recurrence of disease or metastatic disease in the chest, abdomen or pelvis on today's examination. 2. 7 mm subpleural nodule in the periphery of the right lower lobe is stable and favored to be benign. Continued attention on followup studies is recommended to ensure stability. 3. Aortic atherosclerosis, in addition to left anterior descending coronary artery disease. Please note that although the presence of coronary artery calcium documents the presence of coronary artery disease, the severity of this disease and any potential stenosis cannot be assessed on this non-gated CT examination. Assessment for potential risk factor modification, dietary therapy or pharmacologic therapy may be warranted, if clinically indicated. 4. There are calcifications of the mitral valve. Echocardiographic correlation for evaluation of potential valvular dysfunction may be warranted if clinically indicated. 5. Mild diffuse bronchial wall thickening with mild to moderate centrilobular and  mild paraseptal emphysema; imaging findings suggestive of underlying COPD. 6. Tip of left subclavian Port-A-Cath is now misdirected into the subclavian vein.      CANCER STAGING: Cancer Staging Primary cancer of right upper lobe of lung ([facility]) Staging form: Lung, AJCC 7th Edition - Clinical stage from 09/27/2016: Stage IIIB (T4, N3, M0) - Signed by Baird [facility], PA-C on 09/27/2016    INTERVAL HISTORY:  Ms. Guillermo 62 y.o. 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 Patient is here today with her husband. She is still having pain mostly in her hips and legs. Patient is taking 1 meloxicam daily with tramadol for break through pain. She has good days and bad days. She has fatigue throughout the day. She has SOB with any exertion. Patient tries to remain active at home as much as she can due to the pain and SOB. Her husband helps her with any ADLs she cannot perform. Patient denies any nausea, vomiting, or diarrhea. Denies any rash or itching. Denies any bleeding or blood in her stool.     REVIEW OF SYSTEMS:  Review of Systems  Constitutional: Positive for fatigue.  Respiratory: Positive for shortness of breath.   Musculoskeletal: Positive for back pain.  Neurological: Positive for extremity weakness.  All other systems reviewed and are negative.    PAST MEDICAL/SURGICAL HISTORY:  Past Medical History:  Diagnosis Date  . Anxiety   . Arthritis   . Chronic bronchitis ([facility])   . COPD (chronic obstructive pulmonary disease) ([facility])   . Essential hypertension   . GERD (gastroesophageal reflux disease)   . Headache   . History of pneumonia   . Pneumonitis   . Primary cancer of right upper lobe of lung ([facility])    Stage IIIb  adenocarcinoma   Past Surgical History:  Procedure Laterality Date  . ABDOMINAL HYSTERECTOMY    . APPENDECTOMY     when had hysterectomy  . CARPAL TUNNEL RELEASE Bilateral   . ENDOBRONCHIAL ULTRASOUND Bilateral  08/23/2016   Procedure: ENDOBRONCHIAL ULTRASOUND;  Surgeon: Collene Gobble, MD;  Location: [facility];  Service: Cardiopulmonary;  Laterality: Bilateral;  . HERNIA REPAIR     left inguinal hernia age 18  . PORTACATH PLACEMENT Left 09/17/2016   Procedure: INSERTION PORT-A-CATH LEFT SUBCLAVIAN;  Surgeon: Aviva Signs, MD;  Location: [facility];  Service: General;  Laterality: Left;  . TUBAL LIGATION       SOCIAL HISTORY:  Social History   Socioeconomic History  . Marital status: Divorced    Spouse name: Not on file  . Number of children: Not on file  . Years of education: Not on file  . Highest education level: Not on file  Occupational History  . Not on file  Social Needs  . Financial resource strain: Not on file  . Food insecurity:    Worry: Not on file    Inability: Not on file  . Transportation needs:    Medical: Not on file    Non-medical: Not on file  Tobacco Use  . Smoking status: Former Smoker    Packs/day: 2.00    Years: 43.00    Pack years: 86.00    Last attempt to quit: 07/20/2016    Years since quitting: 2.0  . Smokeless tobacco: Never Used  Substance and Sexual Activity  . Alcohol use: No  . Drug use: No  . Sexual activity: Not on file  Lifestyle  . Physical activity:    Days per week: Not on file    Minutes per session: Not on file  . Stress: Not on file  Relationships  . Social connections:    Talks on phone: Not on file    Gets together: Not on file    Attends religious service: Not on file    Active member of club or organization: Not on file    Attends meetings of clubs or organizations: Not on file    Relationship status: Not on file  . Intimate partner violence:    Fear of current or ex partner: Not on file    Emotionally abused: Not on file    Physically abused: Not on file    Forced sexual activity: Not on file  Other Topics Concern  . Not on file  Social History Narrative  . Not on file    FAMILY HISTORY:  Family History  Problem  Relation Age of Onset  . Hypertension Mother   . Lung cancer Mother   . Hypertension Father   . Diabetes Father   . Hypertension Sister   . Lung cancer Brother   . Hypertension Sister   . Colon cancer Neg Hx     CURRENT MEDICATIONS:  Outpatient Encounter Medications as of 08/16/2018  Medication Sig  . albuterol (PROVENTIL HFA;VENTOLIN HFA) 108 (90 Base) MCG/ACT inhaler Inhale 2 puffs into the lungs every 6 (six) hours as needed for wheezing or shortness of breath.  Marland Kitchen aspirin EC 81 MG tablet Take 81 mg by mouth daily.  Marland Kitchen buPROPion (WELLBUTRIN XL) 300 MG 24 hr tablet Take 300 mg by mouth daily.  Marland Kitchen docusate sodium (COLACE) 100 MG capsule Take 100 mg by mouth 2 (two) times daily.  Marland Kitchen esomeprazole (NEXIUM) 20 MG capsule Take 1 capsule (20 mg total) by mouth daily  at 12 noon.  . hydrochlorothiazide (HYDRODIURIL) 12.5 MG tablet   . lidocaine-prilocaine (EMLA) cream Apply 1 application topically as needed.  . loratadine (CLARITIN) 10 MG tablet   . losartan (COZAAR) 50 MG tablet   . losartan-hydrochlorothiazide (HYZAAR) 50-12.5 MG tablet   . meloxicam (MOBIC) 15 MG tablet Take 1 tablet (15 mg total) by mouth daily.  . montelukast (SINGULAIR) 10 MG tablet   . predniSONE (DELTASONE) 2.5 MG tablet   . traMADol (ULTRAM) 50 MG tablet TAKE 1 TABLET BY MOUTH EVERY 6 HOURS AS NEEDED FOR  MODERATE  PAIN  . TRELEGY ELLIPTA 100-62.5-25 MCG/INH AEPB Take 2 puffs by mouth daily.  . vitamin B-12 (CYANOCOBALAMIN) 1000 MCG tablet Take 1,000 mcg by mouth daily.  . [DISCONTINUED] meloxicam (MOBIC) 15 MG tablet Take 1 tablet (15 mg total) by mouth daily.  . [DISCONTINUED] traMADol (ULTRAM) 50 MG tablet TAKE 1 TABLET BY MOUTH EVERY 6 HOURS AS NEEDED FOR  MODERATE  PAIN  . [DISCONTINUED] meloxicam (MOBIC) 15 MG tablet Take 1 tablet (15 mg total) by mouth daily.   No facility-administered encounter medications on file as of 08/16/2018.     ALLERGIES:  Allergies  Allergen Reactions  . Clindamycin/Lincomycin  Rash  . Codeine Anaphylaxis  . Lincomycin Rash    Also Clindamycin as it has lincomycin in it Also Clindamycin as it has lincomycin in it  . Penicillins Anaphylaxis    Has patient had a PCN reaction causing immediate rash, facial/tongue/throat swelling, SOB or lightheadedness with hypotension: Unknown Has patient had a PCN reaction causing severe rash involving mucus membranes or skin necrosis: Unknown Has patient had a PCN reaction that required hospitalization: No Has patient had a PCN reaction occurring within the last 10 years: No If all of the above answers are "NO", then may proceed with Cephalosporin use. Has patient had a PCN reaction causing immediate rash, facial/tongue/throat swelling, SOB or lightheadedness with hypotension: Unknown Has patient had a PCN reaction causing severe rash involving mucus membranes or skin necrosis: Unknown Has patient had a PCN reaction that required hospitalization: No Has patient had a PCN reaction occurring within the last 10 years: No If all of the above answers are "NO", then may proceed with Cephalosporin use.   . Vancomycin Itching    Scalp itching  . Hydrocodone     RASH  . Sulfa Antibiotics Nausea Only     PHYSICAL EXAM:  ECOG Performance status: 1  Vitals:   08/16/18 1428  BP: (!) 134/91  Pulse: 97  Resp: 20  Temp: 98.3 F (36.8 C)  SpO2: 100%   There were no vitals filed for this visit.  Physical Exam  Constitutional: She is oriented to person, place, and time. She appears well-developed and well-nourished.  Cardiovascular: Normal rate, regular rhythm and normal heart sounds.  Pulmonary/Chest: Effort normal and breath sounds normal.  Neurological: She is alert and oriented to person, place, and time.  Skin: Skin is warm and dry.     LABORATORY DATA:  I have reviewed the labs as listed.  CBC    Component Value Date/Time   WBC 5.5 08/14/2018 1318   RBC 3.19 (L) 08/14/2018 1318   HGB 9.9 (L) 08/14/2018 1318   HGB  12.9 09/02/2016 1403   HCT 30.2 (L) 08/14/2018 1318   HCT 38.4 09/02/2016 1403   PLT 366 08/14/2018 1318   PLT 476 (H) 09/02/2016 1403   MCV 94.7 08/14/2018 1318   MCV 94.6 09/02/2016 1403   MCH 31.0  08/14/2018 1318   MCHC 32.8 08/14/2018 1318   RDW 13.7 08/14/2018 1318   RDW 13.6 09/02/2016 1403   LYMPHSABS 0.9 08/14/2018 1318   LYMPHSABS 2.5 09/02/2016 1403   MONOABS 0.5 08/14/2018 1318   MONOABS 0.7 09/02/2016 1403   EOSABS 0.2 08/14/2018 1318   EOSABS 0.7 (H) 09/02/2016 1403   BASOSABS 0.0 08/14/2018 1318   BASOSABS 0.1 09/02/2016 1403   CMP Latest Ref Rng & Units 08/14/2018 04/26/2018 02/01/2018  Glucose 70 - 99 mg/dL 93 102(H) 117(H)  BUN 8 - 23 mg/dL 23 23(H) 15  Creatinine 0.44 - 1.00 mg/dL 0.90 0.96 0.85  Sodium 135 - 145 mmol/L 141 136 138  Potassium 3.5 - 5.1 mmol/L 4.1 3.9 4.1  Chloride 98 - 111 mmol/L 106 98(L) 101  CO2 22 - 32 mmol/L '26 25 25  ' Calcium 8.9 - 10.3 mg/dL 8.7(L) 9.5 9.1  Total Protein 6.5 - 8.1 g/dL 7.0 7.3 -  Total Bilirubin 0.3 - 1.2 mg/dL 0.4 0.5 -  Alkaline Phos 38 - 126 U/L 97 87 -  AST 15 - 41 U/L 14(L) 18 -  ALT 0 - 44 U/L 11 18 -       DIAGNOSTIC IMAGING:  I have independently reviewed images with the patient and her fianc.     ASSESSMENT & PLAN:   Primary cancer of right upper lobe of lung ([facility]) 1.  Stage IIIb (T4 N3 M0) adenocarcinoma of the right upper lobe of the lung: -Diagnosed in August 2017, status post combination chemoradiation therapy with carboplatin and paclitaxel from 09/20/2016 through 10/25/2016, followed by 2 cycles of consolidative carboplatin/paclitaxel from 11/15/2016 through 12/06/2016 - Started on durvalumab consolidation on 01/25/2017, discontinued on 05/09/2017 treatment secondary to pneumonitis - PET/CT scan on 04/24/2018 shows uniform low metabolic activity associated with right upper lobe consolidation most consistent with benign post therapy inflammation with no evidence of metastatic disease. - I have reviewed  PET CT scan dated 08/15/2018 and showed the images to the patient.  There is too small supraclavicular lymph nodes on the right side.  There is also slight soft tissue density in the right upper lobe which is new and hypermetabolic.  But they are too small to biopsy.  Hence I have recommended repeating another scan in 3 months.  See her back after the scan.  2.  Pneumonitis: - She has developed severe pneumonitis which required prednisone 60 mg which was tapered multiple times.  Right now she is on 2.5 mg daily.  Her cough is improving but still there.  This is being managed by Dr. Luan Pulling.  Prednisone was tapered from 5 mg to 2.5 mg in May with the intention of stopping it completely in September.  She also uses pro-air and Trelegy Ellipta.  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  She was reportedly on meloxicam for 5 to 6 months, which was started when she started having musculoskeletal pains with Neulasta.  She reports hurting in the backs of her heels.  She also has some weakness and getting up from sitting position.  She has to climb down the stairs sideways. -She reportedly has Raynard's disease which was diagnosed 4 years ago. - We have offered rheumatology referral but patient refused.  I have done basic work-up including CPK, myoglobin, anti-Jo 1 antibody which were negative.  LDH was normal.  CRP was normal.  ESR was mildly elevated.  ANA was negative. 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  I have offered physical therapy evaluation and treatment.  Patient refused as she was doing a lot of exercises and doing household chores. -She will use meloxicam as needed.  She takes tramadol at nighttime.  We have given prescription refills for both.      Orders placed this encounter:  Orders Placed This Encounter  Procedures  . NM PET Image Restag (PS) Skull Base To  Thigh  . CBC with Differential/Platelet  . Comprehensive metabolic panel  . Ferritin  . Iron and TIBC  . Vitamin B12  . Folate      Derek Jack, MD [place] [phone]

## 2018-08-16 NOTE — Telephone Encounter (Signed)
-----   Message from Derek Jack, MD sent at 08/15/2018  6:11 PM EDT ----- Yes, It's fine with me. Thanks. ----- Message ----- From: Penelope Galas, RN Sent: 08/14/2018   1:34 PM EDT To: Derek Jack, MD, [place], NP-C  Ms. Escudero was accessed for her PET CT today.  She is having frequency but her NP from Dr. Woody Seller office will note give a prescription for her bladder unless she receives something from [place] saying it is ok.  I'll be glad to call them if you say it is ok to give her something for her bladder.    Thanks!  Rickie Gutierres

## 2018-08-16 NOTE — Patient Instructions (Signed)
[facility] at [facility] Discharge Instructions     Thank you for choosing [facility] at [facility] to provide your oncology and hematology care.  To afford each patient quality time with our provider, please arrive at least 15 minutes before your scheduled appointment time.   If you have a lab appointment with the Cancer Center please come in thru the  Main Entrance and check in at the main information desk  You need to re-schedule your appointment should you arrive 10 or more minutes late.  We strive to give you quality time with our providers, and arriving late affects you and other patients whose appointments are after yours.  Also, if you no show three or more times for appointments you may be dismissed from the clinic at the providers discretion.     Again, thank you for choosing [facility].  Our hope is that these requests will decrease the amount of time that you wait before being seen by our physicians.       _____________________________________________________________  Should you have questions after your visit to [facility], please contact our office at [phone] between the hours of 8:00 a.m. and 4:30 p.m.  Voicemails left after 4:00 p.m. will not be returned until the following business day.  For prescription refill requests, have your pharmacy contact our office and allow 72 hours.    Cancer Center Support Programs:   > Cancer Support Group  2nd Tuesday of the month 1pm-2pm, Journey Room    

## 2018-08-16 NOTE — Assessment & Plan Note (Signed)
1.  Stage IIIb (T4 N3 M0) adenocarcinoma of the right upper lobe of the lung: -Diagnosed in August 2017, status post combination chemoradiation therapy with carboplatin and paclitaxel from 09/20/2016 through 10/25/2016, followed by 2 cycles of consolidative carboplatin/paclitaxel from 11/15/2016 through 12/06/2016 - Started on durvalumab consolidation on 01/25/2017, discontinued on 05/09/2017 treatment secondary to pneumonitis - PET/CT scan on 04/24/2018 shows uniform low metabolic activity associated with right upper lobe consolidation most consistent with benign post therapy inflammation with no evidence of metastatic disease. - I have reviewed PET CT scan dated 08/15/2018 and showed the images to the patient.  There is too small supraclavicular lymph nodes on the right side.  There is also slight soft tissue density in the right upper lobe which is new and hypermetabolic.  But they are too small to biopsy.  Hence I have recommended repeating another scan in 3 months.  See her back after the scan.  2.  Pneumonitis: - She has developed severe pneumonitis which required prednisone 60 mg which was tapered multiple times.  Right now she is on 2.5 mg daily.  Her cough is improving but still there.  This is being managed by Dr. Luan Pulling.  Prednisone was tapered from 5 mg to 2.5 mg in May with the intention of stopping it completely in September.  She also uses pro-air and Trelegy Ellipta.  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  She was reportedly on meloxicam for 5 to 6 months, which was started when she started having musculoskeletal pains with Neulasta.  She reports hurting in the backs of her heels.  She also has some weakness and getting up from sitting position.  She has to climb down the stairs sideways. -She reportedly has Raynard's disease which was diagnosed 4 years ago. - We have offered  rheumatology referral but patient refused.  I have done basic work-up including CPK, myoglobin, anti-Jo 1 antibody which were negative.  LDH was normal.  CRP was normal.  ESR was mildly elevated.  ANA was negative. 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  I have offered physical therapy evaluation and treatment.  Patient refused as she was doing a lot of exercises and doing household chores. -She will use meloxicam as needed.  She takes tramadol at nighttime.  We have given prescription refills for both.

## 2018-08-16 NOTE — Telephone Encounter (Signed)
No answer on either phone numbers listed. Message left on the patients voice mail.  Spoke with Glendale Chard from Dr. Woody Seller office and she stated she will call in something for the patients bladder.

## 2018-09-11 ENCOUNTER — Other Ambulatory Visit (HOSPITAL_COMMUNITY): Payer: BLUE CROSS/BLUE SHIELD

## 2018-09-13 ENCOUNTER — Ambulatory Visit (HOSPITAL_COMMUNITY): Payer: BLUE CROSS/BLUE SHIELD | Admitting: Internal Medicine

## 2018-09-26 ENCOUNTER — Encounter (HOSPITAL_COMMUNITY): Payer: BLUE CROSS/BLUE SHIELD

## 2018-10-11 ENCOUNTER — Other Ambulatory Visit (HOSPITAL_COMMUNITY): Payer: Self-pay | Admitting: Nurse Practitioner

## 2018-10-11 DIAGNOSIS — C3411 Malignant neoplasm of upper lobe, right bronchus or lung: Secondary | ICD-10-CM

## 2018-11-20 ENCOUNTER — Inpatient Hospital Stay (HOSPITAL_COMMUNITY): Payer: BLUE CROSS/BLUE SHIELD | Attending: Internal Medicine

## 2018-11-20 ENCOUNTER — Ambulatory Visit (HOSPITAL_COMMUNITY)
Admission: RE | Admit: 2018-11-20 | Discharge: 2018-11-20 | Disposition: A | Discharge status: Home / Self Care | Payer: BLUE CROSS/BLUE SHIELD | Source: Ambulatory Visit | Attending: Nurse Practitioner | Admitting: Nurse Practitioner

## 2018-11-20 DIAGNOSIS — Z87891 Personal history of nicotine dependence: Secondary | ICD-10-CM | POA: Diagnosis not present

## 2018-11-20 DIAGNOSIS — C3411 Malignant neoplasm of upper lobe, right bronchus or lung: Secondary | ICD-10-CM | POA: Insufficient documentation

## 2018-11-20 DIAGNOSIS — D509 Iron deficiency anemia, unspecified: Secondary | ICD-10-CM | POA: Diagnosis not present

## 2018-11-20 DIAGNOSIS — M545 Low back pain: Secondary | ICD-10-CM | POA: Insufficient documentation

## 2018-11-20 DIAGNOSIS — G7289 Other specified myopathies: Secondary | ICD-10-CM | POA: Insufficient documentation

## 2018-11-20 LAB — CBC WITH DIFFERENTIAL/PLATELET
ABS IMMATURE GRANULOCYTES: 0.02 10*3/uL (ref 0.00–0.07)
Basophils Absolute: 0.1 10*3/uL (ref 0.0–0.1)
Basophils Relative: 1 %
Eosinophils Absolute: 0.3 10*3/uL (ref 0.0–0.5)
Eosinophils Relative: 3 %
HCT: 37.6 % (ref 36.0–46.0)
HEMOGLOBIN: 11.8 g/dL — AB (ref 12.0–15.0)
Immature Granulocytes: 0 %
Lymphocytes Relative: 17 %
Lymphs Abs: 1.2 10*3/uL (ref 0.7–4.0)
MCH: 29.2 pg (ref 26.0–34.0)
MCHC: 31.4 g/dL (ref 30.0–36.0)
MCV: 93.1 fL (ref 80.0–100.0)
MONO ABS: 0.6 10*3/uL (ref 0.1–1.0)
Monocytes Relative: 9 %
NEUTROS ABS: 5.1 10*3/uL (ref 1.7–7.7)
Neutrophils Relative %: 70 %
Platelets: 382 10*3/uL (ref 150–400)
RBC: 4.04 MIL/uL (ref 3.87–5.11)
RDW: 15.4 % (ref 11.5–15.5)
WBC: 7.3 10*3/uL (ref 4.0–10.5)
nRBC: 0 % (ref 0.0–0.2)

## 2018-11-20 LAB — COMPREHENSIVE METABOLIC PANEL
ALK PHOS: 103 U/L (ref 38–126)
ALT: 14 U/L (ref 0–44)
AST: 14 U/L — ABNORMAL LOW (ref 15–41)
Albumin: 4.2 g/dL (ref 3.5–5.0)
Anion gap: 9 (ref 5–15)
BUN: 20 mg/dL (ref 8–23)
CALCIUM: 9.1 mg/dL (ref 8.9–10.3)
CO2: 26 mmol/L (ref 22–32)
CREATININE: 0.94 mg/dL (ref 0.44–1.00)
Chloride: 102 mmol/L (ref 98–111)
GFR calc Af Amer: >60 mL/min (ref >60)
GFR calc non Af Amer: >60 mL/min (ref >60)
Glucose, Bld: 91 mg/dL (ref 70–99)
Potassium: 4 mmol/L (ref 3.5–5.1)
SODIUM: 137 mmol/L (ref 135–145)
Total Bilirubin: 0.5 mg/dL (ref 0.3–1.2)
Total Protein: 7.9 g/dL (ref 6.5–8.1)

## 2018-11-20 LAB — IRON AND TIBC
Iron: 52 ug/dL (ref 28–170)
Saturation Ratios: 14 % (ref 10.4–31.8)
TIBC: 365 ug/dL (ref 250–450)
UIBC: 313 ug/dL

## 2018-11-20 LAB — FERRITIN: Ferritin: 13 ng/mL (ref 11–307)

## 2018-11-20 LAB — FOLATE: FOLATE: 8.7 ng/mL (ref >5.9)

## 2018-11-20 LAB — VITAMIN B12: VITAMIN B 12: 2738 pg/mL — AB (ref 180–914)

## 2018-11-20 MED ORDER — FLUDEOXYGLUCOSE F - 18 (FDG) INJECTION
8.7600 | Freq: Once | INTRAVENOUS | Status: AC | PRN
  Administered 2018-11-20: 8.76 via INTRAVENOUS

## 2018-11-22 ENCOUNTER — Inpatient Hospital Stay (HOSPITAL_BASED_OUTPATIENT_CLINIC_OR_DEPARTMENT_OTHER): Payer: BLUE CROSS/BLUE SHIELD | Admitting: Hematology

## 2018-11-22 ENCOUNTER — Encounter (HOSPITAL_COMMUNITY): Payer: Self-pay | Admitting: Hematology

## 2018-11-22 ENCOUNTER — Inpatient Hospital Stay (HOSPITAL_COMMUNITY): Payer: BLUE CROSS/BLUE SHIELD

## 2018-11-22 ENCOUNTER — Other Ambulatory Visit: Payer: Self-pay

## 2018-11-22 VITALS — BP 139/87 | HR 92 | Temp 98.1°F | Resp 20 | Wt 123.5 lb

## 2018-11-22 DIAGNOSIS — Z87891 Personal history of nicotine dependence: Secondary | ICD-10-CM

## 2018-11-22 DIAGNOSIS — M545 Low back pain: Secondary | ICD-10-CM

## 2018-11-22 DIAGNOSIS — G7289 Other specified myopathies: Secondary | ICD-10-CM | POA: Diagnosis not present

## 2018-11-22 DIAGNOSIS — R0609 Other forms of dyspnea: Secondary | ICD-10-CM

## 2018-11-22 DIAGNOSIS — C3411 Malignant neoplasm of upper lobe, right bronchus or lung: Secondary | ICD-10-CM | POA: Diagnosis not present

## 2018-11-22 DIAGNOSIS — D509 Iron deficiency anemia, unspecified: Secondary | ICD-10-CM | POA: Diagnosis not present

## 2018-11-22 MED ORDER — SODIUM CHLORIDE 0.9% FLUSH
10.0000 mL | INTRAVENOUS | Status: DC | PRN
  Administered 2018-11-22: 10 mL via INTRAVENOUS
  Filled 2018-11-22: qty 10

## 2018-11-22 MED ORDER — HEPARIN SOD (PORK) LOCK FLUSH 100 UNIT/ML IV SOLN
500.0000 [IU] | Freq: Once | INTRAVENOUS | Status: AC
  Administered 2018-11-22: 500 [IU] via INTRAVENOUS

## 2018-11-22 MED ORDER — TRAMADOL HCL 50 MG PO TABS: ORAL_TABLET | ORAL | 0 refills | Status: DC

## 2018-11-22 NOTE — Progress Notes (Signed)
Hyman Bower presented for Portacath access and flush.  Proper placement of portacath confirmed by CXR.  Portacath located left chest wall accessed with  H 20 needle.  Good blood return present. Portacath flushed with 37ml NS and 500U/78ml Heparin and needle removed intact.  Procedure tolerated well and without incident.  Discharged ambulatory.

## 2018-11-22 NOTE — Assessment & Plan Note (Addendum)
1.  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  Chronically stable 7 mm nodule at the right lateral costophrenic angle without current hypermetabolic activity. - We will plan to repeat another PET scan in 6 months.  2.  Pneumonitis: - She developed severe pneumonitis and required prednisone which was tapered recently. - She uses pro-air and Trelegy Ellipta. -She has an appointment with Dr. Luan Pulling in the next few weeks.    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  Meloxicam is no longer helping.  She discontinued it. -She takes tramadol as needed.  We will give her a refill on it.  4.  Iron deficiency state: -Ferritin is low at 13.  Hemoglobin is 11.8. - I have recommended starting her on oral iron therapy.  She will start ferrous sulfate 325 mg along with stool softener.  We plan to repeat it in 2 months.  If there is no improvement we will consider parenteral iron therapy.  This will likely improve her fatigue.

## 2018-11-22 NOTE — Patient Instructions (Signed)
[facility] at [facility] Discharge Instructions  Follow up in 3 months with labs a few days prior.  Start taking Oral iron daily. Have PET SCAN in 6 months then follow up after scan with labs    Thank you for choosing [place] at [facility] to provide your oncology and hematology care.  To afford each patient quality time with our provider, please arrive at least 15 minutes before your scheduled appointment time.   If you have a lab appointment with the [facility] please come in thru the  Main Entrance and check in at the main information desk  You need to re-schedule your appointment should you arrive 10 or more minutes late.  We strive to give you quality time with our providers, and arriving late affects you and other patients whose appointments are after yours.  Also, if you no show three or more times for appointments you may be dismissed from the clinic at the providers discretion.     Again, thank you for choosing [facility].  Our hope is that these requests will decrease the amount of time that you wait before being seen by our physicians.       _____________________________________________________________  Should you have questions after your visit to [facility], please contact our office at [phone] between the hours of 8:00 a.m. and 4:30 p.m.  Voicemails left after 4:00 p.m. will not be returned until the following business day.  For prescription refill requests, have your pharmacy contact our office and allow 72 hours.    Cancer Center Support Programs:   > Cancer Support Group  2nd Tuesday of the month 1pm-2pm, Journey Room

## 2018-11-22 NOTE — Progress Notes (Signed)
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  S/P concomitant chemoradiation with carboplatin/paclitaxel (09/20/2016- 10/25/2016) followed by 2 cycle of consolidative chemotherapy with carboplatin/paclitaxel (11/15/2016- 12/06/2016).  Unable to tolerate consolidative durvalumab (Imfinzi) beginning on 01/26/2016 and stopped 05/2017.   AND Imfinzi-induced pneumonitis, grade 2, resulting in holding Imfinzi from 04/25/2017- 05/09/2017 with corticosteroids treatment with taper.  This resolved/improved with steroids. Developed pnemonitis again after rechallenge with imfinzi. On steroid taper again.       Primary cancer of right upper lobe of lung ([place])   08/04/2016 PET scan    PET IMPRESSION: 1. 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 Mass is confluent with the right superior hilum and right lower tracheal wall, suggestive of a T4 primary tumor. 2. Postobstructive pneumonia in the right upper lobe. 3. Hypermetabolic ipsilateral and contralateral mediastinal and contralateral hilar lymphadenopathy, suggestive of N3 nodal disease.    08/24/2016 Procedure    Bronchoscopy    08/25/2016 Pathology Results    Diagnosis Endobronchial biopsy, RUL - POORLY DIFFERENTIATED NON-SMALL CELL CARCINOMA. The immunophenotype is consistent with poorly differentiated adenocarcinoma.    09/10/2016 Imaging    MRI brain-  No metastatic disease or acute intracranial abnormality.    09/20/2016 - 10/25/2016 Chemotherapy    Carboplatin/Paclitaxel weekly x 6 with XRT.    09/20/2016 - 10/25/2016 Radiation Therapy       11/15/2016 - 12/06/2016 Chemotherapy    Carboplatin/paclitaxel every 21 days x 2 cycles.    12/30/2016 Imaging    CT CAP- 1. Central right upper lobe lung mass has mildly decreased since 11/21/2016 chest CT angiogram and significantly decreased since 08/04/2016 PET-CT. 2. No pathologically enlarged thoracic lymph nodes. Previously described hypermetabolic thoracic adenopathy on the 08/04/2016 PET-CT has decreased in size. 3. Previously described bilateral lower lobe pulmonary nodules are stable in size. 4. New solitary subsolid 4 mm right middle lobe pulmonary nodule is nonspecific, and may be inflammatory. Recommend attention on a follow-up chest CT in 3 months. 5. No definite metastatic disease in the abdomen. Tiny sub 5 mm low-attenuation lesions in the liver are too small to characterize, favor benign. 6. Aortic atherosclerosis. One vessel coronary atherosclerosis. 7. Moderate emphysema.    01/25/2017 -  Chemotherapy    Durvalumab (Imfinzi) x 52 weeks.     03/31/2017 Imaging    CT chest with contrast: Stable size of partially calcified mass in the posterior right upper lobe.  Increased airspace disease in paramediastinal right upper and superior right lower lobes. Differential diagnosis includes radiation pneumonitis, drug reaction, and infection.  Stable sub-cm right lower lobe pulmonary nodule.  No evidence of lymphadenopathy or pleural effusion.  Emphysema.  Aortic and coronary artery atherosclerosis.    04/25/2017 Adverse Reaction    Progressive cough, concerning for pneumonitis.    04/25/2017 Treatment Plan Change    Progressive cough concerning for pneumonitis.  Treatment held.  Managed by steroids.    05/09/2017 Treatment Plan Change  Restart Imfinzi with  improvement of cough, Grade 1.    05/27/2017 Imaging    CT CHEST WITH CONTRAST IMPRESSION: 1. Similar size of partially calcified posterior right apical lung mass. 2. Similar to minimal increase in surrounding radiation fibrosis. 3. Subtle areas of mosaic attenuation are greater on the right. Likely attributed to mild ground-glass opacity. In the appropriate clinical setting, this could represent drug toxicity induced pneumonitis. 4.  Coronary artery atherosclerosis. Aortic atherosclerosis. 5. Similar right lower lobe pulmonary nodule.    05/27/2017 Adverse Reaction    Developed worsening cough and SOB again after imfinzi. Grade 2. Placed on long steroid taper.  Permanently discontinue any further imfinzi treatments.    07/29/2017 Imaging    CT chest/abd/pelvis: IMPRESSION: 1. Stable post treatment related changes of mass-like radiation fibrosis in the upper right lung with similar appearance of densely calcified mass in the apex of the right upper lobe. No finding to suggest local recurrence of disease or metastatic disease in the chest, abdomen or pelvis on today's examination. 2. 7 mm subpleural nodule in the periphery of the right lower lobe is stable and favored to be benign. Continued attention on followup studies is recommended to ensure stability. 3. Aortic atherosclerosis, in addition to left anterior descending coronary artery disease. Please note that although the presence of coronary artery calcium documents the presence of coronary artery disease, the severity of this disease and any potential stenosis cannot be assessed on this non-gated CT examination. Assessment for potential risk factor modification, dietary therapy or pharmacologic therapy may be warranted, if clinically indicated. 4. There are calcifications of the mitral valve. Echocardiographic correlation for evaluation of potential valvular dysfunction may be warranted if clinically indicated. 5. Mild diffuse  bronchial wall thickening with mild to moderate centrilobular and mild paraseptal emphysema; imaging findings suggestive of underlying COPD. 6. Tip of left subclavian Port-A-Cath is now misdirected into the subclavian vein.      CANCER STAGING: Cancer Staging Primary cancer of right upper lobe of lung ([place]) Staging form: Lung, AJCC 7th Edition - Clinical stage from 09/27/2016: Stage IIIB (T4, N3, M0) - Signed by Baird Cancer, PA-C on 09/27/2016    INTERVAL HISTORY:  Emily Pope 62 y.o. female returns for routine follow-up for lung cancer and iron deficiency anemia. She is here today with her husband. She is in pain in her back this is constant and is controlled with pain medication. She is SOB with exertion and follow up with Dr. Luan Pulling. She denies any fevers or recent infections. Denies any bleeding or hemoptysis. Denies any new cough. She reports her appetite and energy level good. She lives at home with her husband and performs all her own ADLs.    REVIEW OF SYSTEMS:  Review of Systems  Musculoskeletal: Positive for back pain.  All other systems reviewed and are negative.    PAST MEDICAL/SURGICAL HISTORY:  Past Medical History:  Diagnosis Date  . Anxiety   . Arthritis   . Chronic bronchitis ([place])   . COPD (chronic obstructive pulmonary disease) ([place])   . Essential hypertension   . GERD (gastroesophageal reflux disease)   . Headache   . History of pneumonia   . Pneumonitis   . Primary cancer of right upper lobe of lung ([place])    Stage IIIb adenocarcinoma   Past Surgical History:  Procedure Laterality Date  . ABDOMINAL HYSTERECTOMY    . APPENDECTOMY     when had hysterectomy  . CARPAL TUNNEL RELEASE Bilateral   .  ENDOBRONCHIAL ULTRASOUND Bilateral 08/23/2016   Procedure: ENDOBRONCHIAL ULTRASOUND;  Surgeon: Collene Gobble, MD;  Location: [facility];  Service: Cardiopulmonary;  Laterality: Bilateral;  . HERNIA REPAIR     left inguinal hernia age 45  . PORTACATH  PLACEMENT Left 09/17/2016   Procedure: INSERTION PORT-A-CATH LEFT SUBCLAVIAN;  Surgeon: Aviva Signs, MD;  Location: [facility];  Service: General;  Laterality: Left;  . TUBAL LIGATION       SOCIAL HISTORY:  Social History   Socioeconomic History  . Marital status: Divorced    Spouse name: Not on file  . Number of children: Not on file  . Years of education: Not on file  . Highest education level: Not on file  Occupational History  . Not on file  Social Needs  . Financial resource strain: Not on file  . Food insecurity:    Worry: Not on file    Inability: Not on file  . Transportation needs:    Medical: Not on file    Non-medical: Not on file  Tobacco Use  . Smoking status: Former Smoker    Packs/day: 2.00    Years: 43.00    Pack years: 86.00    Last attempt to quit: 07/20/2016    Years since quitting: 2.3  . Smokeless tobacco: Never Used  Substance and Sexual Activity  . Alcohol use: No  . Drug use: No  . Sexual activity: Not on file  Lifestyle  . Physical activity:    Days per week: Not on file    Minutes per session: Not on file  . Stress: Not on file  Relationships  . Social connections:    Talks on phone: Not on file    Gets together: Not on file    Attends religious service: Not on file    Active member of club or organization: Not on file    Attends meetings of clubs or organizations: Not on file    Relationship status: Not on file  . Intimate partner violence:    Fear of current or ex partner: Not on file    Emotionally abused: Not on file    Physically abused: Not on file    Forced sexual activity: Not on file  Other Topics Concern  . Not on file  Social History Narrative  . Not on file    FAMILY HISTORY:  Family History  Problem Relation Age of Onset  . Hypertension Mother   . Lung cancer Mother   . Hypertension Father   . Diabetes Father   . Hypertension Sister   . Lung cancer Brother   . Hypertension Sister   . Colon cancer Neg Hx      CURRENT MEDICATIONS:  Outpatient Encounter Medications as of 11/22/2018  Medication Sig  . albuterol (PROVENTIL HFA;VENTOLIN HFA) 108 (90 Base) MCG/ACT inhaler Inhale 2 puffs into the lungs every 6 (six) hours as needed for wheezing or shortness of breath.  Marland Kitchen aspirin EC 81 MG tablet Take 81 mg by mouth daily.  Marland Kitchen buPROPion (WELLBUTRIN XL) 300 MG 24 hr tablet Take 300 mg by mouth daily.  . ciprofloxacin (CIPRO) 500 MG tablet Take 500 mg by mouth 2 (two) times daily.  Marland Kitchen docusate sodium (COLACE) 100 MG capsule Take 100 mg by mouth 2 (two) times daily.  Marland Kitchen esomeprazole (NEXIUM) 20 MG capsule Take 1 capsule (20 mg total) by mouth daily at 12 noon.  . hydrochlorothiazide (HYDRODIURIL) 12.5 MG tablet Take 12.5 mg by mouth daily.   Marland Kitchen  lidocaine-prilocaine (EMLA) cream Apply 1 application topically as needed.  . loratadine (CLARITIN) 10 MG tablet Take 10 mg by mouth daily as needed.   Marland Kitchen losartan (COZAAR) 50 MG tablet Take 50 mg by mouth daily.   . montelukast (SINGULAIR) 10 MG tablet Take 10 mg by mouth at bedtime.   . traMADol (ULTRAM) 50 MG tablet Take 1 tablet by mouth every 6 hours as needed for pain.  . TRELEGY ELLIPTA 100-62.5-25 MCG/INH AEPB Take 1 puff by mouth daily.   . vitamin B-12 (CYANOCOBALAMIN) 1000 MCG tablet Take 1,000 mcg by mouth daily.  . [DISCONTINUED] losartan-hydrochlorothiazide (HYZAAR) 50-12.5 MG tablet Take 1 tablet by mouth daily.   . [DISCONTINUED] traMADol (ULTRAM) 50 MG tablet TAKE 1 TABLET BY MOUTH EVERY 6 HOURS AS NEEDED FOR MODERATE PAIN  . meloxicam (MOBIC) 15 MG tablet Take 1 tablet (15 mg total) by mouth daily. (Patient not taking: Reported on 11/22/2018)  . [DISCONTINUED] predniSONE (DELTASONE) 2.5 MG tablet Take 2.5 mg by mouth daily with breakfast.    No facility-administered encounter medications on file as of 11/22/2018.     ALLERGIES:  Allergies  Allergen Reactions  . Clindamycin/Lincomycin Rash  . Codeine Anaphylaxis  . Lincomycin Rash    Also  Clindamycin as it has lincomycin in it Also Clindamycin as it has lincomycin in it  . Other Rash  . Penicillins Anaphylaxis    Has patient had a PCN reaction causing immediate rash, facial/tongue/throat swelling, SOB or lightheadedness with hypotension: Unknown Has patient had a PCN reaction causing severe rash involving mucus membranes or skin necrosis: Unknown Has patient had a PCN reaction that required hospitalization: No Has patient had a PCN reaction occurring within the last 10 years: No If all of the above answers are "NO", then may proceed with Cephalosporin use. Has patient had a PCN reaction causing immediate rash, facial/tongue/throat swelling, SOB or lightheadedness with hypotension: Unknown Has patient had a PCN reaction causing severe rash involving mucus membranes or skin necrosis: Unknown Has patient had a PCN reaction that required hospitalization: No Has patient had a PCN reaction occurring within the last 10 years: No If all of the above answers are "NO", then may proceed with Cephalosporin use.   . Vancomycin Itching    Scalp itching  . Hydrocodone     RASH  . Sulfa Antibiotics Nausea Only     PHYSICAL EXAM:  ECOG Performance status: 1  Vitals:   11/22/18 1355  BP: 139/87  Pulse: 92  Resp: 20  Temp: 98.1 F (36.7 C)  SpO2: 100%   Filed Weights   11/22/18 1355  Weight: 123 lb 8 oz (56 kg)    Physical Exam  Constitutional: She is oriented to person, place, and time. She appears well-developed and well-nourished.  Cardiovascular: Normal rate, regular rhythm and normal heart sounds.  Pulmonary/Chest: Effort normal and breath sounds normal.  Musculoskeletal: Normal range of motion.  Neurological: She is alert and oriented to person, place, and time.  Skin: Skin is warm and dry.  Psychiatric: She has a normal mood and affect. Her behavior is normal. Judgment and thought content normal.     LABORATORY DATA:  I have reviewed the labs as listed.  CBC     Component Value Date/Time   WBC 7.3 11/20/2018 1621   RBC 4.04 11/20/2018 1621   HGB 11.8 (L) 11/20/2018 1621   HGB 12.9 09/02/2016 1403   HCT 37.6 11/20/2018 1621   HCT 38.4 09/02/2016 1403   PLT 382  11/20/2018 1621   PLT 476 (H) 09/02/2016 1403   MCV 93.1 11/20/2018 1621   MCV 94.6 09/02/2016 1403   MCH 29.2 11/20/2018 1621   MCHC 31.4 11/20/2018 1621   RDW 15.4 11/20/2018 1621   RDW 13.6 09/02/2016 1403   LYMPHSABS 1.2 11/20/2018 1621   LYMPHSABS 2.5 09/02/2016 1403   MONOABS 0.6 11/20/2018 1621   MONOABS 0.7 09/02/2016 1403   EOSABS 0.3 11/20/2018 1621   EOSABS 0.7 (H) 09/02/2016 1403   BASOSABS 0.1 11/20/2018 1621   BASOSABS 0.1 09/02/2016 1403   CMP Latest Ref Rng & Units 11/20/2018 08/14/2018 04/26/2018  Glucose 70 - 99 mg/dL 91 93 102(H)  BUN 8 - 23 mg/dL 20 23 23(H)  Creatinine 0.44 - 1.00 mg/dL 0.94 0.90 0.96  Sodium 135 - 145 mmol/L 137 141 136  Potassium 3.5 - 5.1 mmol/L 4.0 4.1 3.9  Chloride 98 - 111 mmol/L 102 106 98(L)  CO2 22 - 32 mmol/L 26 26 25   Calcium 8.9 - 10.3 mg/dL 9.1 8.7(L) 9.5  Total Protein 6.5 - 8.1 g/dL 7.9 7.0 7.3  Total Bilirubin 0.3 - 1.2 mg/dL 0.5 0.4 0.5  Alkaline Phos 38 - 126 U/L 103 97 87  AST 15 - 41 U/L 14(L) 14(L) 18  ALT 0 - 44 U/L 14 11 18        DIAGNOSTIC IMAGING:  I have independently reviewed the scans and discussed with the patient.  I have reviewed Francene Finders, NP's note and agree with the documentation.  I personally performed a face-to-face visit, made revisions and my assessment and plan is as follows.     ASSESSMENT & PLAN:   Primary cancer of right upper lobe of lung ([place]) 1.  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  Chronically stable 7 mm nodule at the right lateral costophrenic angle without current hypermetabolic activity. - We will plan to repeat another PET scan in 6 months.  2.  Pneumonitis: - She developed severe pneumonitis and required prednisone which was tapered recently. - She uses pro-air and Trelegy Ellipta. -She has an appointment with Dr. Luan Pulling in the next few weeks.    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  Meloxicam is no longer helping.  She discontinued it. -She takes tramadol as needed.  We will give her a refill on it.  4.  Iron deficiency state: -Ferritin is low at 13.  Hemoglobin is 11.8. - I have recommended starting her on oral iron therapy.  She will start ferrous sulfate 325 mg along with stool softener.  We plan to repeat it in 2 months.  If there is no improvement we will consider parenteral iron therapy.  This will likely improve her fatigue.      Orders placed this encounter:  Orders Placed This Encounter  Procedures  . NM PET Image Restag (PS) Skull Base To Thigh  . CBC with Differential/Platelet  . Comprehensive metabolic panel  . CBC with Differential/Platelet  . Comprehensive metabolic panel  .  Ferritin  . Iron and TIBC  . Folate  . Vitamin B12  . Lactate dehydrogenase  . TSH      Derek Jack, [place] [phone]

## 2019-02-07 ENCOUNTER — Other Ambulatory Visit (HOSPITAL_COMMUNITY): Payer: Self-pay | Admitting: Nurse Practitioner

## 2019-02-07 DIAGNOSIS — C3411 Malignant neoplasm of upper lobe, right bronchus or lung: Secondary | ICD-10-CM

## 2019-02-22 ENCOUNTER — Inpatient Hospital Stay (HOSPITAL_COMMUNITY): Payer: Medicare Other | Attending: Internal Medicine

## 2019-02-22 VITALS — BP 142/86 | HR 92 | Temp 98.4°F

## 2019-02-22 DIAGNOSIS — C3411 Malignant neoplasm of upper lobe, right bronchus or lung: Secondary | ICD-10-CM | POA: Insufficient documentation

## 2019-02-22 DIAGNOSIS — M1990 Unspecified osteoarthritis, unspecified site: Secondary | ICD-10-CM

## 2019-02-22 LAB — CBC WITH DIFFERENTIAL/PLATELET
Abs Immature Granulocytes: 0.02 10*3/uL (ref 0.00–0.07)
BASOS ABS: 0 10*3/uL (ref 0.0–0.1)
Basophils Relative: 1 %
EOS ABS: 0.3 10*3/uL (ref 0.0–0.5)
Eosinophils Relative: 4 %
HCT: 36.2 % (ref 36.0–46.0)
HEMOGLOBIN: 11.7 g/dL — AB (ref 12.0–15.0)
IMMATURE GRANULOCYTES: 0 %
LYMPHS ABS: 1 10*3/uL (ref 0.7–4.0)
LYMPHS PCT: 16 %
MCH: 31.8 pg (ref 26.0–34.0)
MCHC: 32.3 g/dL (ref 30.0–36.0)
MCV: 98.4 fL (ref 80.0–100.0)
Monocytes Absolute: 0.5 10*3/uL (ref 0.1–1.0)
Monocytes Relative: 9 %
NEUTROS PCT: 70 %
Neutro Abs: 4.2 10*3/uL (ref 1.7–7.7)
Platelets: 307 10*3/uL (ref 150–400)
RBC: 3.68 MIL/uL — AB (ref 3.87–5.11)
RDW: 13.9 % (ref 11.5–15.5)
WBC: 6 10*3/uL (ref 4.0–10.5)
nRBC: 0 % (ref 0.0–0.2)

## 2019-02-22 LAB — COMPREHENSIVE METABOLIC PANEL
ALT: 17 U/L (ref 0–44)
ANION GAP: 9 (ref 5–15)
AST: 16 U/L (ref 15–41)
Albumin: 3.9 g/dL (ref 3.5–5.0)
Alkaline Phosphatase: 111 U/L (ref 38–126)
BUN: 22 mg/dL (ref 8–23)
CHLORIDE: 103 mmol/L (ref 98–111)
CO2: 25 mmol/L (ref 22–32)
Calcium: 8.8 mg/dL — ABNORMAL LOW (ref 8.9–10.3)
Creatinine, Ser: 0.96 mg/dL (ref 0.44–1.00)
Glucose, Bld: 90 mg/dL (ref 70–99)
POTASSIUM: 3.6 mmol/L (ref 3.5–5.1)
Sodium: 137 mmol/L (ref 135–145)
Total Bilirubin: 0.3 mg/dL (ref 0.3–1.2)
Total Protein: 6.9 g/dL (ref 6.5–8.1)

## 2019-02-22 LAB — IRON AND TIBC
IRON: 139 ug/dL (ref 28–170)
SATURATION RATIOS: 44 % — AB (ref 10.4–31.8)
TIBC: 317 ug/dL (ref 250–450)
UIBC: 178 ug/dL

## 2019-02-22 LAB — LACTATE DEHYDROGENASE: LDH: 162 U/L (ref 98–192)

## 2019-02-22 LAB — VITAMIN B12: Vitamin B-12: 3741 pg/mL — ABNORMAL HIGH (ref 180–914)

## 2019-02-22 LAB — TSH: TSH: 3.982 u[IU]/mL (ref 0.350–4.500)

## 2019-02-22 LAB — FERRITIN: FERRITIN: 32 ng/mL (ref 11–307)

## 2019-02-22 LAB — FOLATE: Folate: 5.6 ng/mL — ABNORMAL LOW (ref >5.9)

## 2019-02-22 MED ORDER — HEPARIN SOD (PORK) LOCK FLUSH 100 UNIT/ML IV SOLN
500.0000 [IU] | Freq: Once | INTRAVENOUS | Status: AC
  Administered 2019-02-22: 500 [IU] via INTRAVENOUS

## 2019-02-22 MED ORDER — MELOXICAM 15 MG PO TABS: 15.0000 mg | ORAL_TABLET | Freq: Every day | ORAL | 0 refills | Status: DC

## 2019-02-22 MED ORDER — SODIUM CHLORIDE 0.9% FLUSH
10.0000 mL | Freq: Once | INTRAVENOUS | Status: AC
  Administered 2019-02-22: 10 mL

## 2019-02-22 NOTE — Progress Notes (Signed)
Hyman Bower presented for Portacath access and flush. Portacath located in the left chest wall accessed with  H 20 needle. Clean, Dry and Intact Good blood return present. Portacath flushed with 47ml NS and 500U/49ml Heparin per protocol and needle removed intact. Procedure without incident. Patient tolerated procedure well.

## 2019-02-22 NOTE — Patient Instructions (Signed)
[facility]  Discharge Instructions:   _______________________________________________________________  Thank you for choosing [facility] at [facility] to provide your oncology and hematology care.  To afford each patient quality time with our providers, please arrive at least 15 minutes before your scheduled appointment.  You need to re-schedule your appointment if you arrive 10 or more minutes late.  We strive to give you quality time with our providers, and arriving late affects you and other patients whose appointments are after yours.  Also, if you no show three or more times for appointments you may be dismissed from the clinic.  Again, thank you for choosing [facility] at [facility]. Our hope is that these requests will allow you access to exceptional care and in a timely manner. _______________________________________________________________  If you have questions after your visit, please contact our office at [phone] between the hours of 8:30 a.m. and 5:00 p.m. Voicemails left after 4:30 p.m. will not be returned until the following business day. _______________________________________________________________  For prescription refill requests, have your pharmacy contact our office. _______________________________________________________________  Recommendations made by the consultant and any test results will be sent to your referring physician. _______________________________________________________________ 

## 2019-03-01 ENCOUNTER — Other Ambulatory Visit: Payer: Self-pay

## 2019-03-01 ENCOUNTER — Encounter (HOSPITAL_COMMUNITY): Payer: Self-pay | Admitting: Hematology

## 2019-03-01 ENCOUNTER — Inpatient Hospital Stay (HOSPITAL_COMMUNITY): Payer: Medicare Other | Attending: Internal Medicine | Admitting: Hematology

## 2019-03-01 VITALS — BP 142/81 | HR 102 | Temp 98.1°F | Wt 125.9 lb

## 2019-03-01 DIAGNOSIS — J189 Pneumonia, unspecified organism: Secondary | ICD-10-CM

## 2019-03-01 DIAGNOSIS — C3411 Malignant neoplasm of upper lobe, right bronchus or lung: Secondary | ICD-10-CM | POA: Diagnosis not present

## 2019-03-01 DIAGNOSIS — J449 Chronic obstructive pulmonary disease, unspecified: Secondary | ICD-10-CM | POA: Diagnosis not present

## 2019-03-01 DIAGNOSIS — R05 Cough: Secondary | ICD-10-CM | POA: Diagnosis not present

## 2019-03-01 DIAGNOSIS — Z87891 Personal history of nicotine dependence: Secondary | ICD-10-CM | POA: Diagnosis not present

## 2019-03-01 DIAGNOSIS — R0609 Other forms of dyspnea: Secondary | ICD-10-CM | POA: Diagnosis not present

## 2019-03-01 DIAGNOSIS — D509 Iron deficiency anemia, unspecified: Secondary | ICD-10-CM

## 2019-03-01 MED ORDER — FOLIC ACID 1 MG PO TABS: 1.0000 mg | ORAL_TABLET | Freq: Every day | ORAL | 3 refills | Status: DC

## 2019-03-01 NOTE — Patient Instructions (Signed)
[place] at [facility] Discharge Instructions  You were seen today by Dr. Delton Coombes, he went over how you've been feeling lately and how your breathing has been. He went over your recent lab work and everything looked good except your folic acid . He would like you to take folic acid daily, he has sent that prescription to your pharmacy. We will see you back in June after your scan for labs and follow up.  Thank you for choosing [place] at [facility] to provide your oncology and hematology care.  To afford each patient quality time with our provider, please arrive at least 15 minutes before your scheduled appointment time.   If you have a lab appointment with the [place] please come in thru the  Main Entrance and check in at the main information desk  You need to re-schedule your appointment should you arrive 10 or more minutes late.  We strive to give you quality time with our providers, and arriving late affects you and other patients whose appointments are after yours.  Also, if you no show three or more times for appointments you may be dismissed from the clinic at the providers discretion.     Again, thank you for choosing [facility].  Our hope is that these requests will decrease the amount of time that you wait before being seen by our physicians.       _____________________________________________________________  Should you have questions after your visit to [facility], please contact our office at [phone] between the hours of 8:00 a.m. and 4:30 p.m.  Voicemails left after 4:00 p.m. will not be returned until the following business day.  For prescription refill requests, have your pharmacy contact our office and allow 72 hours.    Cancer Center Support Programs:   > Cancer Support Group  2nd Tuesday of the month 1pm-2pm, Journey Room

## 2019-03-01 NOTE — Progress Notes (Signed)
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  S/P concomitant chemoradiation with carboplatin/paclitaxel (09/20/2016- 10/25/2016) followed by 2 cycle of consolidative chemotherapy with carboplatin/paclitaxel (11/15/2016- 12/06/2016).  Unable to tolerate consolidative durvalumab (Imfinzi) beginning on 01/26/2016 and stopped 05/2017.   AND Imfinzi-induced pneumonitis, grade 2, resulting in holding Imfinzi from 04/25/2017- 05/09/2017 with corticosteroids treatment with taper.  This resolved/improved with steroids. Developed pnemonitis again after rechallenge with imfinzi. On steroid taper again.       Primary cancer of right upper lobe of lung ([place])   08/04/2016 PET scan    PET IMPRESSION: 1. 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 Mass is confluent with the right superior hilum and right lower tracheal wall, suggestive of a T4 primary tumor. 2. Postobstructive pneumonia in the right upper lobe. 3. Hypermetabolic ipsilateral and contralateral mediastinal and contralateral hilar lymphadenopathy, suggestive of N3 nodal disease.    08/24/2016 Procedure    Bronchoscopy    08/25/2016 Pathology Results    Diagnosis Endobronchial biopsy, RUL - POORLY DIFFERENTIATED NON-SMALL CELL CARCINOMA. The immunophenotype is consistent with poorly differentiated adenocarcinoma.    09/10/2016 Imaging    MRI  brain- No metastatic disease or acute intracranial abnormality.    09/20/2016 - 10/25/2016 Chemotherapy    Carboplatin/Paclitaxel weekly x 6 with XRT.    09/20/2016 - 10/25/2016 Radiation Therapy       11/15/2016 - 12/06/2016 Chemotherapy    Carboplatin/paclitaxel every 21 days x 2 cycles.    12/30/2016 Imaging    CT CAP- 1. Central right upper lobe lung mass has mildly decreased since 11/21/2016 chest CT angiogram and significantly decreased since 08/04/2016 PET-CT. 2. No pathologically enlarged thoracic lymph nodes. Previously described hypermetabolic thoracic adenopathy on the 08/04/2016 PET-CT has decreased in size. 3. Previously described bilateral lower lobe pulmonary nodules are stable in size. 4. New solitary subsolid 4 mm right middle lobe pulmonary nodule is nonspecific, and may be inflammatory. Recommend attention on a follow-up chest CT in 3 months. 5. No definite metastatic disease in the abdomen. Tiny sub 5 mm low-attenuation lesions in the liver are too small to characterize, favor benign. 6. Aortic atherosclerosis. One vessel coronary atherosclerosis. 7. Moderate emphysema.    01/25/2017 -  Chemotherapy    Durvalumab (Imfinzi) x 52 weeks.     03/31/2017 Imaging    CT chest with contrast: Stable size of partially calcified mass in the posterior right upper lobe.  Increased airspace disease in paramediastinal right upper and superior right lower lobes. Differential diagnosis includes radiation pneumonitis, drug reaction, and infection.  Stable sub-cm right lower lobe pulmonary nodule.  No evidence of lymphadenopathy or pleural effusion.  Emphysema.  Aortic and coronary artery atherosclerosis.    04/25/2017 Adverse Reaction    Progressive cough, concerning for pneumonitis.    04/25/2017 Treatment Plan Change    Progressive cough concerning for pneumonitis.  Treatment held.  Managed by steroids.    05/09/2017 Treatment Plan Change  Restart Imfinzi  with improvement of cough, Grade 1.    05/27/2017 Imaging    CT CHEST WITH CONTRAST IMPRESSION: 1. Similar size of partially calcified posterior right apical lung mass. 2. Similar to minimal increase in surrounding radiation fibrosis. 3. Subtle areas of mosaic attenuation are greater on the right. Likely attributed to mild ground-glass opacity. In the appropriate clinical setting, this could represent drug toxicity induced pneumonitis. 4.  Coronary artery atherosclerosis. Aortic atherosclerosis. 5. Similar right lower lobe pulmonary nodule.    05/27/2017 Adverse Reaction    Developed worsening cough and SOB again after imfinzi. Grade 2. Placed on long steroid taper.  Permanently discontinue any further imfinzi treatments.    07/29/2017 Imaging    CT chest/abd/pelvis: IMPRESSION: 1. Stable post treatment related changes of mass-like radiation fibrosis in the upper right lung with similar appearance of densely calcified mass in the apex of the right upper lobe. No finding to suggest local recurrence of disease or metastatic disease in the chest, abdomen or pelvis on today's examination. 2. 7 mm subpleural nodule in the periphery of the right lower lobe is stable and favored to be benign. Continued attention on followup studies is recommended to ensure stability. 3. Aortic atherosclerosis, in addition to left anterior descending coronary artery disease. Please note that although the presence of coronary artery calcium documents the presence of coronary artery disease, the severity of this disease and any potential stenosis cannot be assessed on this non-gated CT examination. Assessment for potential risk factor modification, dietary therapy or pharmacologic therapy may be warranted, if clinically indicated. 4. There are calcifications of the mitral valve. Echocardiographic correlation for evaluation of potential valvular dysfunction may be warranted if clinically indicated. 5. Mild  diffuse bronchial wall thickening with mild to moderate centrilobular and mild paraseptal emphysema; imaging findings suggestive of underlying COPD. 6. Tip of left subclavian Port-A-Cath is now misdirected into the subclavian vein.      CANCER STAGING: Cancer Staging Primary cancer of right upper lobe of lung ([place]) Staging form: Lung, AJCC 7th Edition - Clinical stage from 09/27/2016: Stage IIIB (T4, N3, M0) - Signed by Baird Cancer, PA-C on 09/27/2016    INTERVAL HISTORY:  Emily Pope 63 y.o. female returns for routine follow-up adenocarcinoma of the right upper lobe of the lung. She reports her breathing is uncomfortable but not painful. She reports SOB with exertion. She has had a cough and sinus congestion over the past few days. She also has sinus headaches. She was placed on flonase by her primary care MD. Denies any nausea, vomiting, or diarrhea. Denies any new pains. Had not noticed any recent bleeding such as epistaxis, hematuria or hematochezia. Denies recent chest pain on exertion, pre-syncopal episodes, or palpitations. Denies any numbness or tingling in hands or feet. Denies any recent fevers, infections, or recent hospitalizations. Patient reports appetite at 75% and energy level at 50%. She is maintaining her weight at this time.    REVIEW OF SYSTEMS:  Review of Systems  Respiratory: Positive for cough and shortness of breath.   Skin: Positive for itching (left thumb).  Neurological: Positive for headaches.     PAST MEDICAL/SURGICAL HISTORY:  Past Medical History:  Diagnosis Date  . Anxiety   . Arthritis   . Chronic bronchitis ([place])   . COPD (chronic obstructive pulmonary disease) ([place])   . Essential hypertension   . GERD (gastroesophageal reflux disease)   . Headache   . History of pneumonia   . Pneumonitis   .  Primary cancer of right upper lobe of lung ([place])    Stage IIIb adenocarcinoma   Past Surgical History:  Procedure Laterality Date  . ABDOMINAL  HYSTERECTOMY    . APPENDECTOMY     when had hysterectomy  . CARPAL TUNNEL RELEASE Bilateral   . ENDOBRONCHIAL ULTRASOUND Bilateral 08/23/2016   Procedure: ENDOBRONCHIAL ULTRASOUND;  Surgeon: Collene Gobble, MD;  Location: [facility];  Service: Cardiopulmonary;  Laterality: Bilateral;  . HERNIA REPAIR     left inguinal hernia age 76  . PORTACATH PLACEMENT Left 09/17/2016   Procedure: INSERTION PORT-A-CATH LEFT SUBCLAVIAN;  Surgeon: Aviva Signs, MD;  Location: [facility];  Service: General;  Laterality: Left;  . TUBAL LIGATION       SOCIAL HISTORY:  Social History   Socioeconomic History  . Marital status: Divorced    Spouse name: Not on file  . Number of children: Not on file  . Years of education: Not on file  . Highest education level: Not on file  Occupational History  . Not on file  Social Needs  . Financial resource strain: Not on file  . Food insecurity:    Worry: Not on file    Inability: Not on file  . Transportation needs:    Medical: Not on file    Non-medical: Not on file  Tobacco Use  . Smoking status: Former Smoker    Packs/day: 2.00    Years: 43.00    Pack years: 86.00    Last attempt to quit: 07/20/2016    Years since quitting: 2.6  . Smokeless tobacco: Never Used  Substance and Sexual Activity  . Alcohol use: No  . Drug use: No  . Sexual activity: Not on file  Lifestyle  . Physical activity:    Days per week: Not on file    Minutes per session: Not on file  . Stress: Not on file  Relationships  . Social connections:    Talks on phone: Not on file    Gets together: Not on file    Attends religious service: Not on file    Active member of club or organization: Not on file    Attends meetings of clubs or organizations: Not on file    Relationship status: Not on file  . Intimate partner violence:    Fear of current or ex partner: Not on file    Emotionally abused: Not on file    Physically abused: Not on file    Forced sexual activity: Not on file   Other Topics Concern  . Not on file  Social History Narrative  . Not on file    FAMILY HISTORY:  Family History  Problem Relation Age of Onset  . Hypertension Mother   . Lung cancer Mother   . Hypertension Father   . Diabetes Father   . Hypertension Sister   . Lung cancer Brother   . Hypertension Sister   . Colon cancer Neg Hx     CURRENT MEDICATIONS:  Outpatient Encounter Medications as of 03/01/2019  Medication Sig  . albuterol (PROVENTIL HFA;VENTOLIN HFA) 108 (90 Base) MCG/ACT inhaler Inhale 2 puffs into the lungs every 6 (six) hours as needed for wheezing or shortness of breath.  Marland Kitchen aspirin EC 81 MG tablet Take 81 mg by mouth daily.  Marland Kitchen buPROPion (WELLBUTRIN XL) 300 MG 24 hr tablet Take 300 mg by mouth daily.  . ciprofloxacin (CIPRO) 500 MG tablet Take 500 mg by mouth 2 (two) times daily.  Marland Kitchen docusate  sodium (COLACE) 100 MG capsule Take 100 mg by mouth 2 (two) times daily.  Marland Kitchen esomeprazole (NEXIUM) 20 MG capsule Take 1 capsule (20 mg total) by mouth daily at 12 noon.  . hydrochlorothiazide (HYDRODIURIL) 12.5 MG tablet Take 12.5 mg by mouth daily.   Marland Kitchen lidocaine-prilocaine (EMLA) cream Apply 1 application topically as needed.  . loratadine (CLARITIN) 10 MG tablet Take 10 mg by mouth daily as needed.   Marland Kitchen losartan (COZAAR) 50 MG tablet Take 50 mg by mouth daily.   . meloxicam (MOBIC) 15 MG tablet Take 1 tablet (15 mg total) by mouth daily.  . montelukast (SINGULAIR) 10 MG tablet Take 10 mg by mouth at bedtime.   . traMADol (ULTRAM) 50 MG tablet TAKE 1 TABLET BY MOUTH EVERY 6 HOURS AS NEEDED FOR PAIN  . TRELEGY ELLIPTA 100-62.5-25 MCG/INH AEPB Take 1 puff by mouth daily.   . vitamin B-12 (CYANOCOBALAMIN) 1000 MCG tablet Take 1,000 mcg by mouth daily.  . folic acid (FOLVITE) 1 MG tablet Take 1 tablet (1 mg total) by mouth daily.   No facility-administered encounter medications on file as of 03/01/2019.     ALLERGIES:  Allergies  Allergen Reactions  . Clindamycin/Lincomycin Rash   . Codeine Anaphylaxis  . Lincomycin Rash    Also Clindamycin as it has lincomycin in it Also Clindamycin as it has lincomycin in it  . Other Rash  . Penicillins Anaphylaxis    Has patient had a PCN reaction causing immediate rash, facial/tongue/throat swelling, SOB or lightheadedness with hypotension: Unknown Has patient had a PCN reaction causing severe rash involving mucus membranes or skin necrosis: Unknown Has patient had a PCN reaction that required hospitalization: No Has patient had a PCN reaction occurring within the last 10 years: No If all of the above answers are "NO", then may proceed with Cephalosporin use. Has patient had a PCN reaction causing immediate rash, facial/tongue/throat swelling, SOB or lightheadedness with hypotension: Unknown Has patient had a PCN reaction causing severe rash involving mucus membranes or skin necrosis: Unknown Has patient had a PCN reaction that required hospitalization: No Has patient had a PCN reaction occurring within the last 10 years: No If all of the above answers are "NO", then may proceed with Cephalosporin use.   . Vancomycin Itching    Scalp itching  . Hydrocodone     RASH  . Sulfa Antibiotics Nausea Only     PHYSICAL EXAM:  ECOG Performance status: 1  Vitals:   03/01/19 1444  BP: (!) 142/81  Pulse: (!) 102  Temp: 98.1 F (36.7 C)  SpO2: 100%   Filed Weights   03/01/19 1444  Weight: 125 lb 14.4 oz (57.1 kg)    Physical Exam Constitutional:      Appearance: Normal appearance. She is normal weight.  Cardiovascular:     Rate and Rhythm: Normal rate and regular rhythm.     Heart sounds: Normal heart sounds.  Pulmonary:     Effort: Pulmonary effort is normal.     Breath sounds: Normal breath sounds.  Musculoskeletal: Normal range of motion.  Skin:    General: Skin is warm and dry.  Neurological:     Mental Status: She is alert and oriented to person, place, and time. Mental status is at baseline.  Psychiatric:         Mood and Affect: Mood normal.        Behavior: Behavior normal.        Thought Content: Thought content normal.  Judgment: Judgment normal.      LABORATORY DATA:  I have reviewed the labs as listed.  CBC    Component Value Date/Time   WBC 6.0 02/22/2019 1304   RBC 3.68 (L) 02/22/2019 1304   HGB 11.7 (L) 02/22/2019 1304   HGB 12.9 09/02/2016 1403   HCT 36.2 02/22/2019 1304   HCT 38.4 09/02/2016 1403   PLT 307 02/22/2019 1304   PLT 476 (H) 09/02/2016 1403   MCV 98.4 02/22/2019 1304   MCV 94.6 09/02/2016 1403   MCH 31.8 02/22/2019 1304   MCHC 32.3 02/22/2019 1304   RDW 13.9 02/22/2019 1304   RDW 13.6 09/02/2016 1403   LYMPHSABS 1.0 02/22/2019 1304   LYMPHSABS 2.5 09/02/2016 1403   MONOABS 0.5 02/22/2019 1304   MONOABS 0.7 09/02/2016 1403   EOSABS 0.3 02/22/2019 1304   EOSABS 0.7 (H) 09/02/2016 1403   BASOSABS 0.0 02/22/2019 1304   BASOSABS 0.1 09/02/2016 1403   CMP Latest Ref Rng & Units 02/22/2019 11/20/2018 08/14/2018  Glucose 70 - 99 mg/dL 90 91 93  BUN 8 - 23 mg/dL 22 20 23   Creatinine 0.44 - 1.00 mg/dL 0.96 0.94 0.90  Sodium 135 - 145 mmol/L 137 137 141  Potassium 3.5 - 5.1 mmol/L 3.6 4.0 4.1  Chloride 98 - 111 mmol/L 103 102 106  CO2 22 - 32 mmol/L 25 26 26   Calcium 8.9 - 10.3 mg/dL 8.8(L) 9.1 8.7(L)  Total Protein 6.5 - 8.1 g/dL 6.9 7.9 7.0  Total Bilirubin 0.3 - 1.2 mg/dL 0.3 0.5 0.4  Alkaline Phos 38 - 126 U/L 111 103 97  AST 15 - 41 U/L 16 14(L) 14(L)  ALT 0 - 44 U/L 17 14 11        DIAGNOSTIC IMAGING:  I have independently reviewed the scans and discussed with the patient.   I have reviewed Francene Finders, NP's note and agree with the documentation.  I personally performed a face-to-face visit, made revisions and my assessment and plan is as follows.    ASSESSMENT & PLAN:   Primary cancer of right upper lobe of lung ([place]) 1.  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  Chronically stable 7 mm nodule in the right lateral costophrenic angle without current hypermetabolic activity.  - She is scheduled to have a PET CT scan on 06/04/2019.  I will see her after the PET scan.  2.  COPD/pneumonitis: - She developed severe pneumonitis and required prednisone which was tapered recently. - She is using pro-air and Trelegy Ellipta. -She complained of tremors in her hands, since she increased the frequency of pro-air. -She has an appointment to see Dr. Luan Pulling in the next 2 weeks.  3.  Hip pain: - She has pain in the hip region, left more than right. -She is taking tramadol 50 mg twice daily which is helping.  4.  Iron deficiency state: -Hemoglobin today is 11.7 and ferritin improved to 32. -She will continue ferrous sulfate 325 mg daily with stool softener.       Orders placed this encounter:  Orders Placed This  Encounter  Procedures  . TSH  . Lactate dehydrogenase  . Magnesium  . CBC with Differential/Platelet  . Comprehensive metabolic panel  . Ferritin  . Iron and TIBC  . Vitamin B12  . VITAMIN D 25 Hydroxy (Vit-D Deficiency, Fractures)  . Folate      Derek Jack, MD [place] [phone]

## 2019-03-01 NOTE — Assessment & Plan Note (Signed)
1.  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  Chronically stable 7 mm nodule in the right lateral costophrenic angle without current hypermetabolic activity.  - She is scheduled to have a PET CT scan on 06/04/2019.  I will see her after the PET scan.  2.  COPD/pneumonitis: - She developed severe pneumonitis and required prednisone which was tapered recently. - She is using pro-air and Trelegy Ellipta. -She complained of tremors in her hands, since she increased the frequency of pro-air. -She has an appointment to see Dr. Luan Pulling in the next 2 weeks.  3.  Hip pain: - She has pain in the hip region, left more than right. -She is taking tramadol 50 mg twice daily which is helping.  4.  Iron deficiency state: -Hemoglobin today is 11.7 and ferritin improved to 32. -She will continue ferrous sulfate 325 mg daily with stool softener.

## 2019-04-09 ENCOUNTER — Other Ambulatory Visit (HOSPITAL_COMMUNITY): Payer: Self-pay | Admitting: Nurse Practitioner

## 2019-04-09 DIAGNOSIS — C3411 Malignant neoplasm of upper lobe, right bronchus or lung: Secondary | ICD-10-CM

## 2019-05-29 ENCOUNTER — Other Ambulatory Visit (HOSPITAL_COMMUNITY): Payer: Self-pay | Admitting: Hematology

## 2019-05-29 DIAGNOSIS — C3411 Malignant neoplasm of upper lobe, right bronchus or lung: Secondary | ICD-10-CM

## 2019-05-31 ENCOUNTER — Other Ambulatory Visit (HOSPITAL_COMMUNITY): Payer: Medicare Other

## 2019-06-01 ENCOUNTER — Other Ambulatory Visit: Payer: Self-pay

## 2019-06-01 ENCOUNTER — Ambulatory Visit (HOSPITAL_COMMUNITY)
Admission: RE | Admit: 2019-06-01 | Discharge: 2019-06-01 | Disposition: A | Discharge status: Home / Self Care | Payer: Medicare Other | Source: Ambulatory Visit | Attending: Hematology | Admitting: Hematology

## 2019-06-01 ENCOUNTER — Inpatient Hospital Stay (HOSPITAL_COMMUNITY): Payer: Medicare Other | Attending: Hematology

## 2019-06-01 ENCOUNTER — Other Ambulatory Visit (HOSPITAL_COMMUNITY): Payer: Medicare Other

## 2019-06-01 ENCOUNTER — Encounter (HOSPITAL_COMMUNITY): Payer: Self-pay

## 2019-06-01 DIAGNOSIS — D509 Iron deficiency anemia, unspecified: Secondary | ICD-10-CM | POA: Diagnosis not present

## 2019-06-01 DIAGNOSIS — E559 Vitamin D deficiency, unspecified: Secondary | ICD-10-CM | POA: Diagnosis not present

## 2019-06-01 DIAGNOSIS — J449 Chronic obstructive pulmonary disease, unspecified: Secondary | ICD-10-CM | POA: Insufficient documentation

## 2019-06-01 DIAGNOSIS — C3411 Malignant neoplasm of upper lobe, right bronchus or lung: Secondary | ICD-10-CM | POA: Insufficient documentation

## 2019-06-01 DIAGNOSIS — Z79899 Other long term (current) drug therapy: Secondary | ICD-10-CM | POA: Diagnosis not present

## 2019-06-01 DIAGNOSIS — Z87891 Personal history of nicotine dependence: Secondary | ICD-10-CM | POA: Diagnosis not present

## 2019-06-01 LAB — CBC WITH DIFFERENTIAL/PLATELET
Abs Immature Granulocytes: 0.02 10*3/uL (ref 0.00–0.07)
Basophils Absolute: 0.1 10*3/uL (ref 0.0–0.1)
Basophils Relative: 1 %
Eosinophils Absolute: 0.3 10*3/uL (ref 0.0–0.5)
Eosinophils Relative: 5 %
HCT: 38 % (ref 36.0–46.0)
Hemoglobin: 12.3 g/dL (ref 12.0–15.0)
Immature Granulocytes: 0 %
Lymphocytes Relative: 21 %
Lymphs Abs: 1.2 10*3/uL (ref 0.7–4.0)
MCH: 32.9 pg (ref 26.0–34.0)
MCHC: 32.4 g/dL (ref 30.0–36.0)
MCV: 101.6 fL — ABNORMAL HIGH (ref 80.0–100.0)
Monocytes Absolute: 0.5 10*3/uL (ref 0.1–1.0)
Monocytes Relative: 8 %
Neutro Abs: 3.9 10*3/uL (ref 1.7–7.7)
Neutrophils Relative %: 65 %
Platelets: 327 10*3/uL (ref 150–400)
RBC: 3.74 MIL/uL — ABNORMAL LOW (ref 3.87–5.11)
RDW: 12.3 % (ref 11.5–15.5)
WBC: 5.9 10*3/uL (ref 4.0–10.5)
nRBC: 0 % (ref 0.0–0.2)

## 2019-06-01 LAB — TSH: TSH: 3.706 u[IU]/mL (ref 0.350–4.500)

## 2019-06-01 LAB — COMPREHENSIVE METABOLIC PANEL
ALT: 22 U/L (ref 0–44)
AST: 19 U/L (ref 15–41)
Albumin: 4.2 g/dL (ref 3.5–5.0)
Alkaline Phosphatase: 96 U/L (ref 38–126)
Anion gap: 12 (ref 5–15)
BUN: 19 mg/dL (ref 8–23)
CO2: 25 mmol/L (ref 22–32)
Calcium: 9.1 mg/dL (ref 8.9–10.3)
Chloride: 102 mmol/L (ref 98–111)
Creatinine, Ser: 0.87 mg/dL (ref 0.44–1.00)
GFR calc Af Amer: >60 mL/min (ref >60)
GFR calc non Af Amer: >60 mL/min (ref >60)
Glucose, Bld: 87 mg/dL (ref 70–99)
Potassium: 3.8 mmol/L (ref 3.5–5.1)
Sodium: 139 mmol/L (ref 135–145)
Total Bilirubin: 0.4 mg/dL (ref 0.3–1.2)
Total Protein: 7.6 g/dL (ref 6.5–8.1)

## 2019-06-01 LAB — IRON AND TIBC
Iron: 72 ug/dL (ref 28–170)
Saturation Ratios: 24 % (ref 10.4–31.8)
TIBC: 298 ug/dL (ref 250–450)
UIBC: 226 ug/dL

## 2019-06-01 LAB — MAGNESIUM: Magnesium: 2 mg/dL (ref 1.7–2.4)

## 2019-06-01 LAB — FERRITIN: Ferritin: 36 ng/mL (ref 11–307)

## 2019-06-01 LAB — LACTATE DEHYDROGENASE: LDH: 160 U/L (ref 98–192)

## 2019-06-01 LAB — VITAMIN B12: Vitamin B-12: 3950 pg/mL — ABNORMAL HIGH (ref 180–914)

## 2019-06-01 MED ORDER — IOHEXOL 300 MG/ML  SOLN
75.0000 mL | Freq: Once | INTRAMUSCULAR | Status: AC | PRN
  Administered 2019-06-01: 75 mL via INTRAVENOUS

## 2019-06-02 LAB — FOLATE: Folate: 28.9 ng/mL (ref >5.9)

## 2019-06-02 LAB — VITAMIN D 25 HYDROXY (VIT D DEFICIENCY, FRACTURES): Vit D, 25-Hydroxy: 13 ng/mL — ABNORMAL LOW (ref 30.0–100.0)

## 2019-06-04 ENCOUNTER — Other Ambulatory Visit (HOSPITAL_COMMUNITY): Payer: BLUE CROSS/BLUE SHIELD

## 2019-06-07 ENCOUNTER — Other Ambulatory Visit (HOSPITAL_COMMUNITY): Payer: Self-pay | Admitting: Nurse Practitioner

## 2019-06-07 ENCOUNTER — Encounter (HOSPITAL_COMMUNITY): Payer: Self-pay | Admitting: Hematology

## 2019-06-07 ENCOUNTER — Other Ambulatory Visit: Payer: Self-pay

## 2019-06-07 ENCOUNTER — Inpatient Hospital Stay (HOSPITAL_COMMUNITY): Payer: Medicare Other

## 2019-06-07 ENCOUNTER — Encounter (HOSPITAL_COMMUNITY): Payer: Self-pay

## 2019-06-07 ENCOUNTER — Inpatient Hospital Stay (HOSPITAL_BASED_OUTPATIENT_CLINIC_OR_DEPARTMENT_OTHER): Payer: Medicare Other | Admitting: Hematology

## 2019-06-07 VITALS — BP 176/92 | HR 79 | Temp 98.1°F | Resp 18 | Wt 125.2 lb

## 2019-06-07 DIAGNOSIS — M2550 Pain in unspecified joint: Secondary | ICD-10-CM

## 2019-06-07 DIAGNOSIS — E611 Iron deficiency: Secondary | ICD-10-CM | POA: Diagnosis not present

## 2019-06-07 DIAGNOSIS — J449 Chronic obstructive pulmonary disease, unspecified: Secondary | ICD-10-CM

## 2019-06-07 DIAGNOSIS — E559 Vitamin D deficiency, unspecified: Secondary | ICD-10-CM

## 2019-06-07 DIAGNOSIS — C3411 Malignant neoplasm of upper lobe, right bronchus or lung: Secondary | ICD-10-CM | POA: Diagnosis not present

## 2019-06-07 DIAGNOSIS — R7989 Other specified abnormal findings of blood chemistry: Secondary | ICD-10-CM

## 2019-06-07 DIAGNOSIS — R05 Cough: Secondary | ICD-10-CM

## 2019-06-07 DIAGNOSIS — Z87891 Personal history of nicotine dependence: Secondary | ICD-10-CM

## 2019-06-07 MED ORDER — LEVOFLOXACIN 500 MG PO TABS: 500.0000 mg | ORAL_TABLET | Freq: Every day | ORAL | 0 refills | Status: DC

## 2019-06-07 MED ORDER — SODIUM CHLORIDE 0.9% FLUSH
10.0000 mL | INTRAVENOUS | Status: DC | PRN
  Administered 2019-06-07: 15:00:00 10 mL via INTRAVENOUS
  Filled 2019-06-07: qty 10

## 2019-06-07 MED ORDER — HEPARIN SOD (PORK) LOCK FLUSH 100 UNIT/ML IV SOLN
500.0000 [IU] | Freq: Once | INTRAVENOUS | Status: AC
  Administered 2019-06-07: 500 [IU] via INTRAVENOUS

## 2019-06-07 MED ORDER — ERGOCALCIFEROL 1.25 MG (50000 UT) PO CAPS: 50000.0000 [IU] | ORAL_CAPSULE | ORAL | 0 refills | Status: DC

## 2019-06-07 NOTE — Patient Instructions (Signed)
[place] at [facility] Discharge Instructions  Portacath flushed today per protocol. Follow-up as scheduled. Call clinic for any questions or concerns   Thank you for choosing [place] at [facility] to provide your oncology and hematology care.  To afford each patient quality time with our provider, please arrive at least 15 minutes before your scheduled appointment time.   If you have a lab appointment with the [place] please come in thru the  Main Entrance and check in at the main information desk  You need to re-schedule your appointment should you arrive 10 or more minutes late.  We strive to give you quality time with our providers, and arriving late affects you and other patients whose appointments are after yours.  Also, if you no show three or more times for appointments you may be dismissed from the clinic at the providers discretion.     Again, thank you for choosing [facility].  Our hope is that these requests will decrease the amount of time that you wait before being seen by our physicians.       _____________________________________________________________  Should you have questions after your visit to [facility], please contact our office at [phone] between the hours of 8:00 a.m. and 4:30 p.m.  Voicemails left after 4:00 p.m. will not be returned until the following business day.  For prescription refill requests, have your pharmacy contact our office and allow 72 hours.    Cancer Center Support Programs:   > Cancer Support Group  2nd Tuesday of the month 1pm-2pm, Journey Room

## 2019-06-07 NOTE — Patient Instructions (Signed)
[facility] at [facility] Discharge Instructions  You were seen today by Dr. Katragadda. He went over your recent lab and scan results. He will see you back in 6 months for labs and follow up.   Thank you for choosing [facility] at [facility] to provide your oncology and hematology care.  To afford each patient quality time with our provider, please arrive at least 15 minutes before your scheduled appointment time.   If you have a lab appointment with the Cancer Center please come in thru the  Main Entrance and check in at the main information desk  You need to re-schedule your appointment should you arrive 10 or more minutes late.  We strive to give you quality time with our providers, and arriving late affects you and other patients whose appointments are after yours.  Also, if you no show three or more times for appointments you may be dismissed from the clinic at the providers discretion.     Again, thank you for choosing [facility].  Our hope is that these requests will decrease the amount of time that you wait before being seen by our physicians.       _____________________________________________________________  Should you have questions after your visit to [facility], please contact our office at [phone] between the hours of 8:00 a.m. and 4:30 p.m.  Voicemails left after 4:00 p.m. will not be returned until the following business day.  For prescription refill requests, have your pharmacy contact our office and allow 72 hours.    Cancer Center Support Programs:   > Cancer Support Group  2nd Tuesday of the month 1pm-2pm, Journey Room    

## 2019-06-07 NOTE — Progress Notes (Signed)
Emily Pope tolerated portacath flush well without complaints or incident. Port accessed with 20 gauge needle with blood return noted then flushed with 10 ml NS and 5 ml Heparin easily per protocol then de-accessed. Pt discharged self ambulatory in satisfactory condition

## 2019-06-07 NOTE — Progress Notes (Signed)
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  S/P concomitant chemoradiation with carboplatin/paclitaxel (09/20/2016- 10/25/2016) followed by 2 cycle of consolidative chemotherapy with carboplatin/paclitaxel (11/15/2016- 12/06/2016).  Unable to tolerate consolidative durvalumab (Imfinzi) beginning on 01/26/2016 and stopped 05/2017.   AND Imfinzi-induced pneumonitis, grade 2, resulting in holding Imfinzi from 04/25/2017- 05/09/2017 with corticosteroids treatment with taper.  This resolved/improved with steroids. Developed pnemonitis again after rechallenge with imfinzi. On steroid taper again.     Primary cancer of right upper lobe of lung ([place])  08/04/2016 PET scan   PET IMPRESSION: 1. 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 Mass is confluent with the right superior hilum and right lower tracheal wall, suggestive of a T4 primary tumor. 2. Postobstructive pneumonia in the right upper lobe. 3. Hypermetabolic ipsilateral and contralateral mediastinal and contralateral hilar lymphadenopathy, suggestive of N3 nodal disease.   08/24/2016 Procedure   Bronchoscopy   08/25/2016 Pathology Results   Diagnosis Endobronchial biopsy, RUL - POORLY DIFFERENTIATED NON-SMALL CELL CARCINOMA. The immunophenotype is consistent with poorly differentiated adenocarcinoma.   09/10/2016 Imaging   MRI  brain- No metastatic disease or acute intracranial abnormality.   09/20/2016 - 10/25/2016 Chemotherapy   Carboplatin/Paclitaxel weekly x 6 with XRT.   09/20/2016 - 10/25/2016 Radiation Therapy     11/15/2016 - 12/06/2016 Chemotherapy   Carboplatin/paclitaxel every 21 days x 2 cycles.   12/30/2016 Imaging   CT CAP- 1. Central right upper lobe lung mass has mildly decreased since 11/21/2016 chest CT angiogram and significantly decreased since 08/04/2016 PET-CT. 2. No pathologically enlarged thoracic lymph nodes. Previously described hypermetabolic thoracic adenopathy on the 08/04/2016 PET-CT has decreased in size. 3. Previously described bilateral lower lobe pulmonary nodules are stable in size. 4. New solitary subsolid 4 mm right middle lobe pulmonary nodule is nonspecific, and may be inflammatory. Recommend attention on a follow-up chest CT in 3 months. 5. No definite metastatic disease in the abdomen. Tiny sub 5 mm low-attenuation lesions in the liver are too small to characterize, favor benign. 6. Aortic atherosclerosis. One vessel coronary atherosclerosis. 7. Moderate emphysema.   01/25/2017 -  Chemotherapy   Durvalumab (Imfinzi) x 52 weeks.    03/31/2017 Imaging   CT chest with contrast: Stable size of partially calcified mass in the posterior right upper lobe.  Increased airspace disease in paramediastinal right upper and superior right lower lobes. Differential diagnosis includes radiation pneumonitis, drug reaction, and infection.  Stable sub-cm right lower lobe pulmonary nodule.  No evidence of lymphadenopathy or pleural effusion.  Emphysema.  Aortic and coronary artery atherosclerosis.   04/25/2017 Adverse Reaction   Progressive cough, concerning for pneumonitis.   04/25/2017 Treatment Plan Change   Progressive cough concerning for pneumonitis.  Treatment held.  Managed by steroids.   05/09/2017 Treatment Plan Change   Restart Imfinzi with improvement of  cough, Grade 1.   05/27/2017 Imaging   CT CHEST WITH CONTRAST IMPRESSION: 1. Similar size of  partially calcified posterior right apical lung mass. 2. Similar to minimal increase in surrounding radiation fibrosis. 3. Subtle areas of mosaic attenuation are greater on the right. Likely attributed to mild ground-glass opacity. In the appropriate clinical setting, this could represent drug toxicity induced pneumonitis. 4.  Coronary artery atherosclerosis. Aortic atherosclerosis. 5. Similar right lower lobe pulmonary nodule.   05/27/2017 Adverse Reaction   Developed worsening cough and SOB again after imfinzi. Grade 2. Placed on long steroid taper.  Permanently discontinue any further imfinzi treatments.   07/29/2017 Imaging   CT chest/abd/pelvis: IMPRESSION: 1. Stable post treatment related changes of mass-like radiation fibrosis in the upper right lung with similar appearance of densely calcified mass in the apex of the right upper lobe. No finding to suggest local recurrence of disease or metastatic disease in the chest, abdomen or pelvis on today's examination. 2. 7 mm subpleural nodule in the periphery of the right lower lobe is stable and favored to be benign. Continued attention on followup studies is recommended to ensure stability. 3. Aortic atherosclerosis, in addition to left anterior descending coronary artery disease. Please note that although the presence of coronary artery calcium documents the presence of coronary artery disease, the severity of this disease and any potential stenosis cannot be assessed on this non-gated CT examination. Assessment for potential risk factor modification, dietary therapy or pharmacologic therapy may be warranted, if clinically indicated. 4. There are calcifications of the mitral valve. Echocardiographic correlation for evaluation of potential valvular dysfunction may be warranted if clinically indicated. 5. Mild diffuse bronchial wall  thickening with mild to moderate centrilobular and mild paraseptal emphysema; imaging findings suggestive of underlying COPD. 6. Tip of left subclavian Port-A-Cath is now misdirected into the subclavian vein.      CANCER STAGING: Cancer Staging Primary cancer of right upper lobe of lung ([place]) Staging form: Lung, AJCC 7th Edition - Clinical stage from 09/27/2016: Stage IIIB (T4, N3, M0) - Signed by Baird Cancer, PA-C on 09/27/2016    INTERVAL HISTORY:  Emily Pope 63 y.o. female follow-up of her lung cancer.  She is continuing to take iron tablet daily.  She reports cough with yellowish expectoration for the past few days.  She reportedly used all over-the-counter medications which did not help.  She denies any fevers or chills.  No night sweats or weight loss in the last 6 months.  No hemoptysis noted.  Denies any new onset pains.  However she reports multiple joint pains all over the body.  Appetite is 75%.  Energy levels are 25%.  REVIEW OF SYSTEMS:  Review of Systems  Respiratory: Positive for cough and shortness of breath.   Skin: Negative for itching (left thumb).  Neurological: Negative for headaches.  Psychiatric/Behavioral: Positive for sleep disturbance.  All other systems reviewed and are negative.    PAST MEDICAL/SURGICAL HISTORY:  Past Medical History:  Diagnosis Date  . Anxiety   . Arthritis   . Chronic bronchitis ([place])   . COPD (chronic obstructive pulmonary disease) ([place])   . Essential hypertension   . GERD (gastroesophageal reflux disease)   . Headache   . History of pneumonia   . Pneumonitis   . Primary cancer of right upper lobe of lung ([place])    Stage IIIb adenocarcinoma   Past Surgical History:  Procedure Laterality Date  . ABDOMINAL HYSTERECTOMY    . APPENDECTOMY     when had hysterectomy  . CARPAL TUNNEL RELEASE Bilateral   . ENDOBRONCHIAL ULTRASOUND Bilateral 08/23/2016  Procedure: ENDOBRONCHIAL ULTRASOUND;  Surgeon: Collene Gobble, MD;   Location: [facility];  Service: Cardiopulmonary;  Laterality: Bilateral;  . HERNIA REPAIR     left inguinal hernia age 45  . PORTACATH PLACEMENT Left 09/17/2016   Procedure: INSERTION PORT-A-CATH LEFT SUBCLAVIAN;  Surgeon: Aviva Signs, MD;  Location: [facility];  Service: General;  Laterality: Left;  . TUBAL LIGATION       SOCIAL HISTORY:  Social History   Socioeconomic History  . Marital status: Divorced    Spouse name: Not on file  . Number of children: Not on file  . Years of education: Not on file  . Highest education level: Not on file  Occupational History  . Not on file  Social Needs  . Financial resource strain: Not on file  . Food insecurity    Worry: Not on file    Inability: Not on file  . Transportation needs    Medical: Not on file    Non-medical: Not on file  Tobacco Use  . Smoking status: Former Smoker    Packs/day: 2.00    Years: 43.00    Pack years: 86.00    Quit date: 07/20/2016    Years since quitting: 2.8  . Smokeless tobacco: Never Used  Substance and Sexual Activity  . Alcohol use: No  . Drug use: No  . Sexual activity: Not on file  Lifestyle  . Physical activity    Days per week: Not on file    Minutes per session: Not on file  . Stress: Not on file  Relationships  . Social Herbalist on phone: Not on file    Gets together: Not on file    Attends religious service: Not on file    Active member of club or organization: Not on file    Attends meetings of clubs or organizations: Not on file    Relationship status: Not on file  . Intimate partner violence    Fear of current or ex partner: Not on file    Emotionally abused: Not on file    Physically abused: Not on file    Forced sexual activity: Not on file  Other Topics Concern  . Not on file  Social History Narrative  . Not on file    FAMILY HISTORY:  Family History  Problem Relation Age of Onset  . Hypertension Mother   . Lung cancer Mother   . Hypertension Father   .  Diabetes Father   . Hypertension Sister   . Lung cancer Brother   . Hypertension Sister   . Colon cancer Neg Hx     CURRENT MEDICATIONS:  Outpatient Encounter Medications as of 06/07/2019  Medication Sig  . albuterol (PROVENTIL HFA;VENTOLIN HFA) 108 (90 Base) MCG/ACT inhaler Inhale 2 puffs into the lungs every 6 (six) hours as needed for wheezing or shortness of breath.  Marland Kitchen aspirin EC 81 MG tablet Take 81 mg by mouth daily.  Marland Kitchen buPROPion (WELLBUTRIN XL) 300 MG 24 hr tablet Take 300 mg by mouth daily.  Marland Kitchen docusate sodium (COLACE) 100 MG capsule Take 100 mg by mouth 2 (two) times daily.  . folic acid (FOLVITE) 1 MG tablet Take 1 tablet (1 mg total) by mouth daily.  . hydrochlorothiazide (HYDRODIURIL) 12.5 MG tablet Take 12.5 mg by mouth daily.   Marland Kitchen lidocaine-prilocaine (EMLA) cream Apply 1 application topically as needed.  Marland Kitchen losartan (COZAAR) 50 MG tablet Take 50 mg by mouth daily.   . traMADol (  ULTRAM) 50 MG tablet TAKE 1 TABLET BY MOUTH EVERY 6 HOURS AS NEEDED FOR PAIN  . TRELEGY ELLIPTA 100-62.5-25 MCG/INH AEPB Take 1 puff by mouth daily.   . vitamin B-12 (CYANOCOBALAMIN) 1000 MCG tablet Take 1,000 mcg by mouth daily.  . ergocalciferol (VITAMIN D2) 1.25 MG (50000 UT) capsule Take 1 capsule (50,000 Units total) by mouth once a week.  Marland Kitchen levofloxacin (LEVAQUIN) 500 MG tablet Take 1 tablet (500 mg total) by mouth daily.  Marland Kitchen loratadine (CLARITIN) 10 MG tablet Take 10 mg by mouth daily as needed.   . meloxicam (MOBIC) 15 MG tablet Take 1 tablet (15 mg total) by mouth daily. (Patient not taking: Reported on 06/07/2019)  . montelukast (SINGULAIR) 10 MG tablet Take 10 mg by mouth at bedtime.   . [DISCONTINUED] ciprofloxacin (CIPRO) 500 MG tablet Take 500 mg by mouth 2 (two) times daily.  . [DISCONTINUED] esomeprazole (NEXIUM) 20 MG capsule Take 1 capsule (20 mg total) by mouth daily at 12 noon. (Patient not taking: Reported on 06/07/2019)   No facility-administered encounter medications on file as of  06/07/2019.     ALLERGIES:  Allergies  Allergen Reactions  . Clindamycin/Lincomycin Rash  . Codeine Anaphylaxis  . Lincomycin Rash    Also Clindamycin as it has lincomycin in it Also Clindamycin as it has lincomycin in it  . Other Rash  . Penicillins Anaphylaxis    Has patient had a PCN reaction causing immediate rash, facial/tongue/throat swelling, SOB or lightheadedness with hypotension: Unknown Has patient had a PCN reaction causing severe rash involving mucus membranes or skin necrosis: Unknown Has patient had a PCN reaction that required hospitalization: No Has patient had a PCN reaction occurring within the last 10 years: No If all of the above answers are "NO", then may proceed with Cephalosporin use. Has patient had a PCN reaction causing immediate rash, facial/tongue/throat swelling, SOB or lightheadedness with hypotension: Unknown Has patient had a PCN reaction causing severe rash involving mucus membranes or skin necrosis: Unknown Has patient had a PCN reaction that required hospitalization: No Has patient had a PCN reaction occurring within the last 10 years: No If all of the above answers are "NO", then may proceed with Cephalosporin use.   . Vancomycin Itching    Scalp itching  . Hydrocodone     RASH  . Sulfa Antibiotics Nausea Only     PHYSICAL EXAM:  ECOG Performance status: 1  Vitals:   06/07/19 1400  BP: (!) 176/92  Pulse: 79  Resp: 18  Temp: 98.1 F (36.7 C)  SpO2: 100%   Filed Weights   06/07/19 1400  Weight: 125 lb 3.2 oz (56.8 kg)    Physical Exam Constitutional:      Appearance: Normal appearance. She is normal weight.  Cardiovascular:     Rate and Rhythm: Normal rate and regular rhythm.     Heart sounds: Normal heart sounds.  Pulmonary:     Effort: Pulmonary effort is normal.     Breath sounds: Normal breath sounds.  Musculoskeletal: Normal range of motion.  Skin:    General: Skin is warm and dry.  Neurological:     Mental Status:  She is alert and oriented to person, place, and time. Mental status is at baseline.  Psychiatric:        Mood and Affect: Mood normal.        Behavior: Behavior normal.        Thought Content: Thought content normal.  Judgment: Judgment normal.      LABORATORY DATA:  I have reviewed the labs as listed.  CBC    Component Value Date/Time   WBC 5.9 06/01/2019 1448   RBC 3.74 (L) 06/01/2019 1448   HGB 12.3 06/01/2019 1448   HGB 12.9 09/02/2016 1403   HCT 38.0 06/01/2019 1448   HCT 38.4 09/02/2016 1403   PLT 327 06/01/2019 1448   PLT 476 (H) 09/02/2016 1403   MCV 101.6 (H) 06/01/2019 1448   MCV 94.6 09/02/2016 1403   MCH 32.9 06/01/2019 1448   MCHC 32.4 06/01/2019 1448   RDW 12.3 06/01/2019 1448   RDW 13.6 09/02/2016 1403   LYMPHSABS 1.2 06/01/2019 1448   LYMPHSABS 2.5 09/02/2016 1403   MONOABS 0.5 06/01/2019 1448   MONOABS 0.7 09/02/2016 1403   EOSABS 0.3 06/01/2019 1448   EOSABS 0.7 (H) 09/02/2016 1403   BASOSABS 0.1 06/01/2019 1448   BASOSABS 0.1 09/02/2016 1403   CMP Latest Ref Rng & Units 06/01/2019 02/22/2019 11/20/2018  Glucose 70 - 99 mg/dL 87 90 91  BUN 8 - 23 mg/dL 19 22 20   Creatinine 0.44 - 1.00 mg/dL 0.87 0.96 0.94  Sodium 135 - 145 mmol/L 139 137 137  Potassium 3.5 - 5.1 mmol/L 3.8 3.6 4.0  Chloride 98 - 111 mmol/L 102 103 102  CO2 22 - 32 mmol/L 25 25 26   Calcium 8.9 - 10.3 mg/dL 9.1 8.8(L) 9.1  Total Protein 6.5 - 8.1 g/dL 7.6 6.9 7.9  Total Bilirubin 0.3 - 1.2 mg/dL 0.4 0.3 0.5  Alkaline Phos 38 - 126 U/L 96 111 103  AST 15 - 41 U/L 19 16 14(L)  ALT 0 - 44 U/L 22 17 14        DIAGNOSTIC IMAGING:  I have independently reviewed the scans and discussed with the patient.     ASSESSMENT & PLAN:   Primary cancer of right upper lobe of lung ([place]) 1.  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  - PET scan on 11/20/2018 showed further reduction in activity associated with right apical consolidation, likely treatment related, without current evidence of malignancy.  Chronically stable 7 mm nodule in the right lateral costophrenic angle without current hypermetabolic activity.  - We reviewed results of CT of the chest on 06/02/2019 which shows treatment effect within the right upper lobe with no evidence of local recurrence or residual disease. - I will see her back in 6 months with repeat CT scan of the chest. -She is complaining of congestion in her chest with yellowish expectoration.  I will give her Levaquin for 7 days.  2.  COPD/pneumonitis: - She is using Ventolin and Trelegy.  She is also taking Singulair.  3.  Vitamin D deficiency: -She has severe vitamin D deficiency with a level of 13. -I will start her on weekly high-dose vitamin D for 12 weeks.  She was told to follow-up with her primary doctor to check the levels and decrease the dose.  4.  Iron deficiency state: -Hemoglobin is normal today.  Ferritin is 34. -She will continue iron tablet daily with stool softener. .   Total time spent is 25 minutes more than 50% of the time spent face-to-face discussing treatment plan  and coordination of care.    Orders placed this encounter:  Orders Placed This Encounter  Procedures  . CT Chest W Contrast  . CBC with Differential/Platelet  . Comprehensive metabolic panel  . Vitamin D 25 hydroxy      Derek Jack, MD [place] [phone]

## 2019-06-07 NOTE — Assessment & Plan Note (Signed)
1.  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  - PET scan on 11/20/2018 showed further reduction in activity associated with right apical consolidation, likely treatment related, without current evidence of malignancy.  Chronically stable 7 mm nodule in the right lateral costophrenic angle without current hypermetabolic activity.  - We reviewed results of CT of the chest on 06/02/2019 which shows treatment effect within the right upper lobe with no evidence of local recurrence or residual disease. - I will see her back in 6 months with repeat CT scan of the chest. -She is complaining of congestion in her chest with yellowish expectoration.  I will give her Levaquin for 7 days.  2.  COPD/pneumonitis: - She is using Ventolin and Trelegy.  She is also taking Singulair.  3.  Vitamin D deficiency: -She has severe vitamin D deficiency with a level of 13. -I will start her on weekly high-dose vitamin D for 12 weeks.  She was told to follow-up with her primary doctor to check the levels and decrease the dose.  4.  Iron deficiency state: -Hemoglobin is normal today.  Ferritin is 34. -She will continue iron tablet daily with stool softener. Marland Kitchen

## 2019-06-25 ENCOUNTER — Other Ambulatory Visit (HOSPITAL_COMMUNITY): Payer: Self-pay | Admitting: Nurse Practitioner

## 2019-06-25 DIAGNOSIS — C3411 Malignant neoplasm of upper lobe, right bronchus or lung: Secondary | ICD-10-CM

## 2019-07-23 ENCOUNTER — Other Ambulatory Visit (HOSPITAL_COMMUNITY): Payer: Self-pay | Admitting: Nurse Practitioner

## 2019-07-23 DIAGNOSIS — C3411 Malignant neoplasm of upper lobe, right bronchus or lung: Secondary | ICD-10-CM

## 2019-07-27 ENCOUNTER — Other Ambulatory Visit (HOSPITAL_COMMUNITY): Payer: Self-pay | Admitting: Nurse Practitioner

## 2019-07-27 DIAGNOSIS — C3411 Malignant neoplasm of upper lobe, right bronchus or lung: Secondary | ICD-10-CM

## 2019-08-22 ENCOUNTER — Other Ambulatory Visit (HOSPITAL_COMMUNITY): Payer: Self-pay | Admitting: Nurse Practitioner

## 2019-08-22 DIAGNOSIS — C3411 Malignant neoplasm of upper lobe, right bronchus or lung: Secondary | ICD-10-CM

## 2019-09-07 ENCOUNTER — Other Ambulatory Visit: Payer: Self-pay

## 2019-09-07 ENCOUNTER — Inpatient Hospital Stay (HOSPITAL_COMMUNITY): Payer: Medicare Other | Attending: Hematology

## 2019-09-07 DIAGNOSIS — Z452 Encounter for adjustment and management of vascular access device: Secondary | ICD-10-CM | POA: Diagnosis not present

## 2019-09-07 DIAGNOSIS — C3411 Malignant neoplasm of upper lobe, right bronchus or lung: Secondary | ICD-10-CM | POA: Insufficient documentation

## 2019-09-07 MED ORDER — SODIUM CHLORIDE 0.9% FLUSH
10.0000 mL | Freq: Once | INTRAVENOUS | Status: AC
  Administered 2019-09-07: 10 mL

## 2019-09-07 MED ORDER — HEPARIN SOD (PORK) LOCK FLUSH 100 UNIT/ML IV SOLN
500.0000 [IU] | Freq: Once | INTRAVENOUS | Status: AC
  Administered 2019-09-07: 500 [IU] via INTRAVENOUS

## 2019-09-07 NOTE — Progress Notes (Signed)
Hyman Bower presented for Portacath access and flush.  left chest wall accessed with  H 20 needle. Clean, Dry and Intact Good blood return present. Portacath flushed with 58ml NS and 500U/53ml Heparin per protocol and needle removed intact. Procedure without incident. Patient tolerated procedure well.

## 2019-09-12 ENCOUNTER — Telehealth: Payer: Self-pay | Admitting: Cardiology

## 2019-09-12 NOTE — Telephone Encounter (Signed)
FYI.  °Contacted patient regarding recall appointment, patient notified our office they did not wish to keep this appointment at this time.  Deleted recall from system. °

## 2019-09-13 ENCOUNTER — Other Ambulatory Visit (HOSPITAL_COMMUNITY): Payer: Self-pay | Admitting: *Deleted

## 2019-09-13 DIAGNOSIS — C3411 Malignant neoplasm of upper lobe, right bronchus or lung: Secondary | ICD-10-CM

## 2019-09-13 MED ORDER — LIDOCAINE-PRILOCAINE 2.5-2.5 % EX CREA: 1.0000 "application " | TOPICAL_CREAM | CUTANEOUS | 3 refills | Status: DC | PRN

## 2019-09-21 ENCOUNTER — Other Ambulatory Visit (HOSPITAL_COMMUNITY): Payer: Self-pay | Admitting: Nurse Practitioner

## 2019-09-21 DIAGNOSIS — C3411 Malignant neoplasm of upper lobe, right bronchus or lung: Secondary | ICD-10-CM

## 2019-10-22 ENCOUNTER — Other Ambulatory Visit (HOSPITAL_COMMUNITY): Payer: Self-pay | Admitting: Nurse Practitioner

## 2019-10-22 DIAGNOSIS — C3411 Malignant neoplasm of upper lobe, right bronchus or lung: Secondary | ICD-10-CM

## 2019-11-20 ENCOUNTER — Other Ambulatory Visit (HOSPITAL_COMMUNITY): Payer: Self-pay | Admitting: Nurse Practitioner

## 2019-11-20 DIAGNOSIS — C3411 Malignant neoplasm of upper lobe, right bronchus or lung: Secondary | ICD-10-CM

## 2019-12-06 ENCOUNTER — Other Ambulatory Visit (HOSPITAL_COMMUNITY): Payer: Self-pay | Admitting: Nurse Practitioner

## 2019-12-06 DIAGNOSIS — C3411 Malignant neoplasm of upper lobe, right bronchus or lung: Secondary | ICD-10-CM

## 2019-12-11 ENCOUNTER — Ambulatory Visit (HOSPITAL_COMMUNITY)
Admission: RE | Admit: 2019-12-11 | Discharge: 2019-12-11 | Disposition: A | Discharge status: Home / Self Care | Payer: Medicare Other | Source: Ambulatory Visit | Attending: Hematology | Admitting: Hematology

## 2019-12-11 ENCOUNTER — Inpatient Hospital Stay (HOSPITAL_COMMUNITY): Payer: Medicare Other | Attending: Hematology

## 2019-12-11 ENCOUNTER — Other Ambulatory Visit: Payer: Self-pay

## 2019-12-11 DIAGNOSIS — Z90710 Acquired absence of both cervix and uterus: Secondary | ICD-10-CM | POA: Insufficient documentation

## 2019-12-11 DIAGNOSIS — Z87891 Personal history of nicotine dependence: Secondary | ICD-10-CM | POA: Insufficient documentation

## 2019-12-11 DIAGNOSIS — I10 Essential (primary) hypertension: Secondary | ICD-10-CM | POA: Insufficient documentation

## 2019-12-11 DIAGNOSIS — Z833 Family history of diabetes mellitus: Secondary | ICD-10-CM | POA: Diagnosis not present

## 2019-12-11 DIAGNOSIS — Z452 Encounter for adjustment and management of vascular access device: Secondary | ICD-10-CM | POA: Insufficient documentation

## 2019-12-11 DIAGNOSIS — E559 Vitamin D deficiency, unspecified: Secondary | ICD-10-CM | POA: Insufficient documentation

## 2019-12-11 DIAGNOSIS — C3411 Malignant neoplasm of upper lobe, right bronchus or lung: Secondary | ICD-10-CM | POA: Diagnosis not present

## 2019-12-11 DIAGNOSIS — Z8249 Family history of ischemic heart disease and other diseases of the circulatory system: Secondary | ICD-10-CM | POA: Diagnosis not present

## 2019-12-11 DIAGNOSIS — F419 Anxiety disorder, unspecified: Secondary | ICD-10-CM | POA: Diagnosis not present

## 2019-12-11 DIAGNOSIS — J440 Chronic obstructive pulmonary disease with acute lower respiratory infection: Secondary | ICD-10-CM | POA: Diagnosis not present

## 2019-12-11 DIAGNOSIS — Z7982 Long term (current) use of aspirin: Secondary | ICD-10-CM | POA: Diagnosis not present

## 2019-12-11 DIAGNOSIS — Z79899 Other long term (current) drug therapy: Secondary | ICD-10-CM | POA: Diagnosis not present

## 2019-12-11 DIAGNOSIS — Z9221 Personal history of antineoplastic chemotherapy: Secondary | ICD-10-CM | POA: Diagnosis not present

## 2019-12-11 DIAGNOSIS — Z923 Personal history of irradiation: Secondary | ICD-10-CM | POA: Diagnosis not present

## 2019-12-11 DIAGNOSIS — Z791 Long term (current) use of non-steroidal anti-inflammatories (NSAID): Secondary | ICD-10-CM | POA: Insufficient documentation

## 2019-12-11 DIAGNOSIS — Z801 Family history of malignant neoplasm of trachea, bronchus and lung: Secondary | ICD-10-CM | POA: Insufficient documentation

## 2019-12-11 LAB — CBC WITH DIFFERENTIAL/PLATELET
Abs Immature Granulocytes: 0.02 10*3/uL (ref 0.00–0.07)
Basophils Absolute: 0.1 10*3/uL (ref 0.0–0.1)
Basophils Relative: 1 %
Eosinophils Absolute: 0.2 10*3/uL (ref 0.0–0.5)
Eosinophils Relative: 4 %
HCT: 38.4 % (ref 36.0–46.0)
Hemoglobin: 12.6 g/dL (ref 12.0–15.0)
Immature Granulocytes: 0 %
Lymphocytes Relative: 17 %
Lymphs Abs: 0.9 10*3/uL (ref 0.7–4.0)
MCH: 33.6 pg (ref 26.0–34.0)
MCHC: 32.8 g/dL (ref 30.0–36.0)
MCV: 102.4 fL — ABNORMAL HIGH (ref 80.0–100.0)
Monocytes Absolute: 0.5 10*3/uL (ref 0.1–1.0)
Monocytes Relative: 9 %
Neutro Abs: 3.6 10*3/uL (ref 1.7–7.7)
Neutrophils Relative %: 69 %
Platelets: 325 10*3/uL (ref 150–400)
RBC: 3.75 MIL/uL — ABNORMAL LOW (ref 3.87–5.11)
RDW: 12 % (ref 11.5–15.5)
WBC: 5.3 10*3/uL (ref 4.0–10.5)
nRBC: 0 % (ref 0.0–0.2)

## 2019-12-11 LAB — COMPREHENSIVE METABOLIC PANEL
ALT: 11 U/L (ref 0–44)
AST: 15 U/L (ref 15–41)
Albumin: 4.1 g/dL (ref 3.5–5.0)
Alkaline Phosphatase: 89 U/L (ref 38–126)
Anion gap: 8 (ref 5–15)
BUN: 16 mg/dL (ref 8–23)
CO2: 29 mmol/L (ref 22–32)
Calcium: 9.2 mg/dL (ref 8.9–10.3)
Chloride: 102 mmol/L (ref 98–111)
Creatinine, Ser: 0.89 mg/dL (ref 0.44–1.00)
GFR calc Af Amer: >60 mL/min (ref >60)
GFR calc non Af Amer: >60 mL/min (ref >60)
Glucose, Bld: 89 mg/dL (ref 70–99)
Potassium: 4.4 mmol/L (ref 3.5–5.1)
Sodium: 139 mmol/L (ref 135–145)
Total Bilirubin: 0.7 mg/dL (ref 0.3–1.2)
Total Protein: 7.4 g/dL (ref 6.5–8.1)

## 2019-12-11 LAB — VITAMIN D 25 HYDROXY (VIT D DEFICIENCY, FRACTURES): Vit D, 25-Hydroxy: 24.92 ng/mL — ABNORMAL LOW (ref 30–100)

## 2019-12-11 MED ORDER — IOHEXOL 300 MG/ML  SOLN
75.0000 mL | Freq: Once | INTRAMUSCULAR | Status: AC | PRN
  Administered 2019-12-11: 75 mL via INTRAVENOUS

## 2019-12-13 ENCOUNTER — Inpatient Hospital Stay (HOSPITAL_BASED_OUTPATIENT_CLINIC_OR_DEPARTMENT_OTHER): Payer: Medicare Other | Admitting: Hematology

## 2019-12-13 ENCOUNTER — Other Ambulatory Visit: Payer: Self-pay

## 2019-12-13 ENCOUNTER — Inpatient Hospital Stay (HOSPITAL_COMMUNITY): Payer: Medicare Other

## 2019-12-13 ENCOUNTER — Encounter (HOSPITAL_COMMUNITY): Payer: Self-pay | Admitting: Hematology

## 2019-12-13 VITALS — BP 131/80 | HR 97 | Temp 97.5°F | Resp 18 | Wt 124.9 lb

## 2019-12-13 DIAGNOSIS — C3411 Malignant neoplasm of upper lobe, right bronchus or lung: Secondary | ICD-10-CM

## 2019-12-13 DIAGNOSIS — Z452 Encounter for adjustment and management of vascular access device: Secondary | ICD-10-CM | POA: Diagnosis not present

## 2019-12-13 MED ORDER — TRAMADOL HCL 50 MG PO TABS: 50.0000 mg | ORAL_TABLET | Freq: Four times a day (QID) | ORAL | 0 refills | Status: DC | PRN

## 2019-12-13 MED ORDER — HEPARIN SOD (PORK) LOCK FLUSH 100 UNIT/ML IV SOLN
500.0000 [IU] | Freq: Once | INTRAVENOUS | Status: AC
  Administered 2019-12-13: 14:00:00 500 [IU] via INTRAVENOUS

## 2019-12-13 MED ORDER — SODIUM CHLORIDE 0.9% FLUSH
10.0000 mL | INTRAVENOUS | Status: DC | PRN
  Administered 2019-12-13: 14:00:00 10 mL via INTRAVENOUS

## 2019-12-13 NOTE — Patient Instructions (Signed)
[facility] at [facility] Discharge Instructions  Portacath flushed per protocol today. Follow-up as scheduled. Call clinic for any questions or concerns   Thank you for choosing [facility] at [facility] to provide your oncology and hematology care.  To afford each patient quality time with our provider, please arrive at least 15 minutes before your scheduled appointment time.   If you have a lab appointment with the Cancer Center please come in thru the Main Entrance and check in at the main information desk.  You need to re-schedule your appointment should you arrive 10 or more minutes late.  We strive to give you quality time with our providers, and arriving late affects you and other patients whose appointments are after yours.  Also, if you no show three or more times for appointments you may be dismissed from the clinic at the providers discretion.     Again, thank you for choosing [facility].  Our hope is that these requests will decrease the amount of time that you wait before being seen by our physicians.       _____________________________________________________________  Should you have questions after your visit to [facility], please contact our office at [phone] between the hours of 8:00 a.m. and 4:30 p.m.  Voicemails left after 4:00 p.m. will not be returned until the following business day.  For prescription refill requests, have your pharmacy contact our office and allow 72 hours.    Due to Covid, you will need to wear a mask upon entering the hospital. If you do not have a mask, a mask will be given to you at the Main Entrance upon arrival. For doctor visits, patients may have 1 support person with them. For treatment visits, patients can not have anyone with them due to social distancing guidelines and our immunocompromised population.     

## 2019-12-13 NOTE — Progress Notes (Signed)
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  S/P concomitant chemoradiation with carboplatin/paclitaxel (09/20/2016- 10/25/2016) followed by 2 cycle of consolidative chemotherapy with carboplatin/paclitaxel (11/15/2016- 12/06/2016).  Unable to tolerate consolidative durvalumab (Imfinzi) beginning on 01/26/2016 and stopped 05/2017.   AND Imfinzi-induced pneumonitis, grade 2, resulting in holding Imfinzi from 04/25/2017- 05/09/2017 with corticosteroids treatment with taper.  This resolved/improved with steroids. Developed pnemonitis again after rechallenge with imfinzi. On steroid taper again.     Primary cancer of right upper lobe of lung ([facility])  08/04/2016 PET scan   PET IMPRESSION: 1. 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 Mass is confluent with the right superior hilum and right lower tracheal wall, suggestive of a T4 primary tumor. 2. Postobstructive pneumonia in the right upper lobe. 3. Hypermetabolic ipsilateral and contralateral mediastinal and contralateral hilar lymphadenopathy, suggestive of N3 nodal disease.   08/24/2016 Procedure   Bronchoscopy   08/25/2016 Pathology Results   Diagnosis Endobronchial biopsy, RUL - POORLY DIFFERENTIATED NON-SMALL CELL CARCINOMA. The immunophenotype is consistent with poorly differentiated adenocarcinoma.   09/10/2016 Imaging   MRI brain- No metastatic disease or acute intracranial abnormality.   09/20/2016 -  10/25/2016 Chemotherapy   Carboplatin/Paclitaxel weekly x 6 with XRT.   09/20/2016 - 10/25/2016 Radiation Therapy     11/15/2016 - 12/06/2016 Chemotherapy   Carboplatin/paclitaxel every 21 days x 2 cycles.   12/30/2016 Imaging   CT CAP- 1. Central right upper lobe lung mass has mildly decreased since 11/21/2016 chest CT angiogram and significantly decreased since 08/04/2016 PET-CT. 2. No pathologically enlarged thoracic lymph nodes. Previously described hypermetabolic thoracic adenopathy on the 08/04/2016 PET-CT has decreased in size. 3. Previously described bilateral lower lobe pulmonary nodules are stable in size. 4. New solitary subsolid 4 mm right middle lobe pulmonary nodule is nonspecific, and may be inflammatory. Recommend attention on a follow-up chest CT in 3 months. 5. No definite metastatic disease in the abdomen. Tiny sub 5 mm low-attenuation lesions in the liver are too small to characterize, favor benign. 6. Aortic atherosclerosis. One vessel coronary atherosclerosis. 7. Moderate emphysema.   01/25/2017 -  Chemotherapy   Durvalumab (Imfinzi) x 52 weeks.    03/31/2017 Imaging   CT chest with contrast: Stable size of partially calcified mass in the posterior right upper lobe.  Increased airspace disease in paramediastinal right upper and superior right lower lobes. Differential diagnosis includes radiation pneumonitis, drug reaction, and infection.  Stable sub-cm right lower lobe pulmonary nodule.  No evidence of lymphadenopathy or pleural effusion.  Emphysema.  Aortic and coronary artery atherosclerosis.   04/25/2017 Adverse Reaction   Progressive cough, concerning for pneumonitis.   04/25/2017 Treatment Plan Change   Progressive cough concerning for pneumonitis.  Treatment held.  Managed by steroids.   05/09/2017 Treatment Plan Change   Restart Imfinzi with improvement of cough, Grade 1.   05/27/2017 Imaging   CT CHEST WITH CONTRAST IMPRESSION: 1.  Similar size of partially calcified posterior right apical lung mass. 2. Similar to minimal increase in surrounding radiation fibrosis. 3. Subtle  areas of mosaic attenuation are greater on the right. Likely attributed to mild ground-glass opacity. In the appropriate clinical setting, this could represent drug toxicity induced pneumonitis. 4.  Coronary artery atherosclerosis. Aortic atherosclerosis. 5. Similar right lower lobe pulmonary nodule.   05/27/2017 Adverse Reaction   Developed worsening cough and SOB again after imfinzi. Grade 2. Placed on long steroid taper.  Permanently discontinue any further imfinzi treatments.   07/29/2017 Imaging   CT chest/abd/pelvis: IMPRESSION: 1. Stable post treatment related changes of mass-like radiation fibrosis in the upper right lung with similar appearance of densely calcified mass in the apex of the right upper lobe. No finding to suggest local recurrence of disease or metastatic disease in the chest, abdomen or pelvis on today's examination. 2. 7 mm subpleural nodule in the periphery of the right lower lobe is stable and favored to be benign. Continued attention on followup studies is recommended to ensure stability. 3. Aortic atherosclerosis, in addition to left anterior descending coronary artery disease. Please note that although the presence of coronary artery calcium documents the presence of coronary artery disease, the severity of this disease and any potential stenosis cannot be assessed on this non-gated CT examination. Assessment for potential risk factor modification, dietary therapy or pharmacologic therapy may be warranted, if clinically indicated. 4. There are calcifications of the mitral valve. Echocardiographic correlation for evaluation of potential valvular dysfunction may be warranted if clinically indicated. 5. Mild diffuse bronchial wall thickening with mild to moderate centrilobular and mild paraseptal emphysema; imaging  findings suggestive of underlying COPD. 6. Tip of left subclavian Port-A-Cath is now misdirected into the subclavian vein.      CANCER STAGING: Cancer Staging Primary cancer of right upper lobe of lung ([facility]) Staging form: Lung, AJCC 7th Edition - Clinical stage from 09/27/2016: Stage IIIB (T4, N3, M0) - Signed by Baird Cancer, PA-C on 09/27/2016    INTERVAL HISTORY:  Emily Pope 63 y.o. female Libby Maw today for follow-up.  She reports overall doing well.  She does report fullness of breath on exertion.  Shortness of breath does resolve with rest.  She continues with reports of chronic productive cough.  She produces white to brown sputum.  She does follow with pulmonology regularly.  She denies any recurrent infections.  Denies any fevers, chills, night sweats.  Bowel habits are stable.  No change change in weight.  She is here to discuss most recent results of scans.  REVIEW OF SYSTEMS:  Review of Systems  Constitutional: Positive for fatigue.  HENT:  Negative.   Eyes: Negative.   Respiratory: Positive for cough and shortness of breath.   Cardiovascular: Negative.   Gastrointestinal: Negative.   Endocrine: Negative.   Genitourinary: Negative.    Musculoskeletal: Positive for arthralgias and myalgias.  Skin: Negative.   Neurological: Negative.   Hematological: Negative.   Psychiatric/Behavioral: Negative.      PAST MEDICAL/SURGICAL HISTORY:  Past Medical History:  Diagnosis Date  . Anxiety   . Arthritis   . Chronic bronchitis ([place])   . COPD (chronic obstructive pulmonary disease) ([facility])   . Essential hypertension   . GERD (gastroesophageal reflux disease)   . Headache   . History of pneumonia   . Pneumonitis   . Primary cancer of right upper lobe of lung ([facility])    Stage IIIb adenocarcinoma   Past Surgical History:  Procedure Laterality Date  . ABDOMINAL HYSTERECTOMY    . APPENDECTOMY     when had hysterectomy  . CARPAL TUNNEL RELEASE  Bilateral   .  ENDOBRONCHIAL ULTRASOUND Bilateral 08/23/2016   Procedure: ENDOBRONCHIAL ULTRASOUND;  Surgeon: Collene Gobble, MD;  Location: [facility];  Service: Cardiopulmonary;  Laterality: Bilateral;  . HERNIA REPAIR     left inguinal hernia age 6  . PORTACATH PLACEMENT Left 09/17/2016   Procedure: INSERTION PORT-A-CATH LEFT SUBCLAVIAN;  Surgeon: Aviva Signs, MD;  Location: [facility];  Service: General;  Laterality: Left;  . TUBAL LIGATION       SOCIAL HISTORY:  Social History   Socioeconomic History  . Marital status: Divorced    Spouse name: Not on file  . Number of children: Not on file  . Years of education: Not on file  . Highest education level: Not on file  Occupational History  . Not on file  Tobacco Use  . Smoking status: Former Smoker    Packs/day: 2.00    Years: 43.00    Pack years: 86.00    Quit date: 07/20/2016    Years since quitting: 3.4  . Smokeless tobacco: Never Used  Substance and Sexual Activity  . Alcohol use: No  . Drug use: No  . Sexual activity: Not on file  Other Topics Concern  . Not on file  Social History Narrative  . Not on file   Social Determinants of Health   Financial Resource Strain:   . Difficulty of Paying Living Expenses: Not on file  Food Insecurity:   . Worried About Charity fundraiser in the Last Year: Not on file  . Ran Out of Food in the Last Year: Not on file  Transportation Needs:   . Lack of Transportation (Medical): Not on file  . Lack of Transportation (Non-Medical): Not on file  Physical Activity:   . Days of Exercise per Week: Not on file  . Minutes of Exercise per Session: Not on file  Stress:   . Feeling of Stress : Not on file  Social Connections:   . Frequency of Communication with Friends and Family: Not on file  . Frequency of Social Gatherings with Friends and Family: Not on file  . Attends Religious Services: Not on file  . Active Member of Clubs or Organizations: Not on file  . Attends Archivist  Meetings: Not on file  . Marital Status: Not on file  Intimate Partner Violence:   . Fear of Current or Ex-Partner: Not on file  . Emotionally Abused: Not on file  . Physically Abused: Not on file  . Sexually Abused: Not on file    FAMILY HISTORY:  Family History  Problem Relation Age of Onset  . Hypertension Mother   . Lung cancer Mother   . Hypertension Father   . Diabetes Father   . Hypertension Sister   . Lung cancer Brother   . Hypertension Sister   . Colon cancer Neg Hx     CURRENT MEDICATIONS:  Outpatient Encounter Medications as of 12/13/2019  Medication Sig  . aspirin EC 81 MG tablet Take 81 mg by mouth daily.  Marland Kitchen buPROPion (WELLBUTRIN XL) 300 MG 24 hr tablet Take 300 mg by mouth daily.  Marland Kitchen docusate sodium (COLACE) 100 MG capsule Take 100 mg by mouth 2 (two) times daily.  . ferrous sulfate 325 (65 FE) MG tablet Take 325 mg by mouth daily with breakfast.  . folic acid (FOLVITE) 1 MG tablet Take 1 tablet by mouth once daily  . hydrochlorothiazide (HYDRODIURIL) 12.5 MG tablet Take 12.5 mg by mouth daily.   Marland Kitchen  losartan (COZAAR) 50 MG tablet Take 50 mg by mouth daily.   . TRELEGY ELLIPTA 100-62.5-25 MCG/INH AEPB Take 1 puff by mouth daily.   . vitamin B-12 (CYANOCOBALAMIN) 1000 MCG tablet Take 1,000 mcg by mouth daily.  . [DISCONTINUED] ergocalciferol (VITAMIN D2) 1.25 MG (50000 UT) capsule Take 1 capsule (50,000 Units total) by mouth once a week.  . [DISCONTINUED] montelukast (SINGULAIR) 10 MG tablet Take 10 mg by mouth at bedtime.   Marland Kitchen albuterol (PROVENTIL HFA;VENTOLIN HFA) 108 (90 Base) MCG/ACT inhaler Inhale 2 puffs into the lungs every 6 (six) hours as needed for wheezing or shortness of breath.  . lidocaine-prilocaine (EMLA) cream Apply 1 application topically as needed. (Patient not taking: Reported on 12/13/2019)  . loratadine (CLARITIN) 10 MG tablet Take 10 mg by mouth daily as needed.   . traMADol (ULTRAM) 50 MG tablet Take 1 tablet (50 mg total) by mouth every 6  (six) hours as needed. for pain  . [DISCONTINUED] folic acid (FOLVITE) 1 MG tablet Take 1 tablet by mouth once daily  . [DISCONTINUED] levofloxacin (LEVAQUIN) 500 MG tablet Take 1 tablet (500 mg total) by mouth daily.  . [DISCONTINUED] meloxicam (MOBIC) 15 MG tablet Take 1 tablet (15 mg total) by mouth daily. (Patient not taking: Reported on 06/07/2019)  . [DISCONTINUED] traMADol (ULTRAM) 50 MG tablet TAKE 1 TABLET BY MOUTH EVERY 6 HOURS AS NEEDED FOR PAIN (Patient not taking: Reported on 12/13/2019)   No facility-administered encounter medications on file as of 12/13/2019.    ALLERGIES:  Allergies  Allergen Reactions  . Clindamycin/Lincomycin Rash  . Codeine Anaphylaxis  . Lincomycin Rash    Also Clindamycin as it has lincomycin in it Also Clindamycin as it has lincomycin in it  . Other Rash  . Penicillins Anaphylaxis    Has patient had a PCN reaction causing immediate rash, facial/tongue/throat swelling, SOB or lightheadedness with hypotension: Unknown Has patient had a PCN reaction causing severe rash involving mucus membranes or skin necrosis: Unknown Has patient had a PCN reaction that required hospitalization: No Has patient had a PCN reaction occurring within the last 10 years: No If all of the above answers are "NO", then may proceed with Cephalosporin use. Has patient had a PCN reaction causing immediate rash, facial/tongue/throat swelling, SOB or lightheadedness with hypotension: Unknown Has patient had a PCN reaction causing severe rash involving mucus membranes or skin necrosis: Unknown Has patient had a PCN reaction that required hospitalization: No Has patient had a PCN reaction occurring within the last 10 years: No If all of the above answers are "NO", then may proceed with Cephalosporin use.   . Vancomycin Itching    Scalp itching  . Hydrocodone     RASH  . Sulfa Antibiotics Nausea Only     PHYSICAL EXAM:  ECOG Performance status: 1  Vitals:   12/13/19 1310   BP: 131/80  Pulse: 97  Resp: 18  Temp: (!) 97.5 F (36.4 C)  SpO2: 100%   Filed Weights   12/13/19 1310  Weight: 124 lb 14.4 oz (56.7 kg)    Physical Exam Constitutional:      Appearance: Normal appearance.  HENT:     Head: Normocephalic.     Right Ear: External ear normal.     Left Ear: External ear normal.     Nose: Nose normal.     Mouth/Throat:     Mouth: Mucous membranes are moist.  Eyes:     Conjunctiva/sclera: Conjunctivae normal.  Cardiovascular:  Rate and Rhythm: Normal rate and regular rhythm.     Pulses: Normal pulses.     Heart sounds: Normal heart sounds.  Pulmonary:     Effort: Pulmonary effort is normal.     Breath sounds: Rhonchi present.  Abdominal:     General: Bowel sounds are normal.  Musculoskeletal:        General: Normal range of motion.     Cervical back: Normal range of motion.  Skin:    General: Skin is warm.  Neurological:     General: No focal deficit present.     Mental Status: She is alert and oriented to person, place, and time.  Psychiatric:        Mood and Affect: Mood normal.        Behavior: Behavior normal.      LABORATORY DATA:  I have reviewed the labs as listed.  CBC    Component Value Date/Time   WBC 5.3 12/11/2019 0934   RBC 3.75 (L) 12/11/2019 0934   HGB 12.6 12/11/2019 0934   HGB 12.9 09/02/2016 1403   HCT 38.4 12/11/2019 0934   HCT 38.4 09/02/2016 1403   PLT 325 12/11/2019 0934   PLT 476 (H) 09/02/2016 1403   MCV 102.4 (H) 12/11/2019 0934   MCV 94.6 09/02/2016 1403   MCH 33.6 12/11/2019 0934   MCHC 32.8 12/11/2019 0934   RDW 12.0 12/11/2019 0934   RDW 13.6 09/02/2016 1403   LYMPHSABS 0.9 12/11/2019 0934   LYMPHSABS 2.5 09/02/2016 1403   MONOABS 0.5 12/11/2019 0934   MONOABS 0.7 09/02/2016 1403   EOSABS 0.2 12/11/2019 0934   EOSABS 0.7 (H) 09/02/2016 1403   BASOSABS 0.1 12/11/2019 0934   BASOSABS 0.1 09/02/2016 1403   CMP Latest Ref Rng & Units 12/11/2019 06/01/2019 02/22/2019  Glucose 70 - 99  mg/dL 89 87 90  BUN 8 - 23 mg/dL 16 19 22   Creatinine 0.44 - 1.00 mg/dL 0.89 0.87 0.96  Sodium 135 - 145 mmol/L 139 139 137  Potassium 3.5 - 5.1 mmol/L 4.4 3.8 3.6  Chloride 98 - 111 mmol/L 102 102 103  CO2 22 - 32 mmol/L 29 25 25   Calcium 8.9 - 10.3 mg/dL 9.2 9.1 8.8(L)  Total Protein 6.5 - 8.1 g/dL 7.4 7.6 6.9  Total Bilirubin 0.3 - 1.2 mg/dL 0.7 0.4 0.3  Alkaline Phos 38 - 126 U/L 89 96 111  AST 15 - 41 U/L 15 19 16   ALT 0 - 44 U/L 11 22 17           ASSESSMENT & PLAN:   Primary cancer of right upper lobe of lung ([facility]) 1.  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  - PET scan on 11/20/2018 showed further reduction in activity associated with right apical consolidation, likely treatment related, without current evidence of malignancy.  Chronically stable 7 mm nodule in the right lateral costophrenic angle without current hypermetabolic activity.  - We reviewed results of CT of the chest on 06/02/2019 which shows treatment effect within the right upper lobe with no evidence of local recurrence or residual disease. -CT chest showed no changes in right apical consolidation.  A new 3 mm left upper lobe nodule is new since the prior  exam.  Recommend attention on follow-up.  Plan to repeat CT chest in 6 months.  If new left upper lobe nodule has increased in size this would warrant a PET CT followed by possible biopsy if FDG avid.  We will continue to monitor and follow closely. -Repeat CT chest in 6 months.  2.  COPD/pneumonitis: - She is using Ventolin and Trelegy.  She is also taking Singulair.  3.  Vitamin D  deficiency: -Recommend continue vitamin D weekly..  4.  Iron deficiency state: -Hemoglobin is normal today.  Ferritin is 34. -She will continue iron tablet daily with stool softener. .       Orders placed this encounter:  Orders Placed This Encounter  Procedures  . [place] [phone]

## 2019-12-13 NOTE — Progress Notes (Signed)
Emily Pope tolerated portacath flush well without complaints or incident. Port accessed with 20 gauge needle with blood return noted then flushed with 10 ml NS and 5 ml Heparin easily per protocol then de-accessed. Pt discharged self ambulatory in satisfactory condition

## 2019-12-13 NOTE — Assessment & Plan Note (Signed)
1.  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  - PET scan on 11/20/2018 showed further reduction in activity associated with right apical consolidation, likely treatment related, without current evidence of malignancy.  Chronically stable 7 mm nodule in the right lateral costophrenic angle without current hypermetabolic activity.  - We reviewed results of CT of the chest on 06/02/2019 which shows treatment effect within the right upper lobe with no evidence of local recurrence or residual disease. -CT chest showed no changes in right apical consolidation.  A new 3 mm left upper lobe nodule is new since the prior exam.  Recommend attention on follow-up.  Plan to repeat CT chest in 6 months.  If new left upper lobe nodule has increased in size this would warrant a PET CT followed by possible biopsy if FDG avid.  We will continue to monitor and follow closely. -Repeat CT chest in 6 months.  2.  COPD/pneumonitis: - She is using Ventolin and Trelegy.  She is also taking Singulair.  3.  Vitamin D deficiency: -Recommend continue vitamin D weekly..  4.  Iron deficiency state: -Hemoglobin is normal today.  Ferritin is 34. -She will continue iron tablet daily with stool softener. Marland Kitchen

## 2019-12-22 ENCOUNTER — Other Ambulatory Visit (HOSPITAL_COMMUNITY): Payer: Self-pay | Admitting: Nurse Practitioner

## 2019-12-22 DIAGNOSIS — C3411 Malignant neoplasm of upper lobe, right bronchus or lung: Secondary | ICD-10-CM

## 2020-01-02 DIAGNOSIS — Z923 Personal history of irradiation: Secondary | ICD-10-CM | POA: Diagnosis not present

## 2020-01-02 DIAGNOSIS — Z9225 Personal history of immunosupression therapy: Secondary | ICD-10-CM | POA: Diagnosis not present

## 2020-01-02 DIAGNOSIS — C3411 Malignant neoplasm of upper lobe, right bronchus or lung: Secondary | ICD-10-CM | POA: Diagnosis not present

## 2020-01-02 DIAGNOSIS — Z6824 Body mass index (BMI) 24.0-24.9, adult: Secondary | ICD-10-CM | POA: Diagnosis not present

## 2020-01-20 ENCOUNTER — Other Ambulatory Visit (HOSPITAL_COMMUNITY): Payer: Self-pay | Admitting: Nurse Practitioner

## 2020-01-20 DIAGNOSIS — C3411 Malignant neoplasm of upper lobe, right bronchus or lung: Secondary | ICD-10-CM

## 2020-02-20 ENCOUNTER — Other Ambulatory Visit (HOSPITAL_COMMUNITY): Payer: Self-pay | Admitting: Nurse Practitioner

## 2020-02-20 DIAGNOSIS — C3411 Malignant neoplasm of upper lobe, right bronchus or lung: Secondary | ICD-10-CM

## 2020-02-24 ENCOUNTER — Ambulatory Visit: Payer: Medicare Other | Attending: Internal Medicine

## 2020-02-24 DIAGNOSIS — Z23 Encounter for immunization: Secondary | ICD-10-CM | POA: Insufficient documentation

## 2020-02-24 NOTE — Progress Notes (Signed)
   Covid-19 Vaccination Clinic  Name:  Emily Pope    MRN: [id number] DOB: September 04, 1956  02/24/2020  [name] was observed post Covid-19 immunization for 30 minutes based on pre-vaccination screening without incidence. She was provided with Vaccine Information Sheet and instruction to access the V-Safe system.   [name] was instructed to call 911 with any severe reactions post vaccine: Marland Kitchen Difficulty breathing  . Swelling of your face and throat  . A fast heartbeat  . A bad rash all over your body  . Dizziness and weakness    Immunizations Administered    Name Date Dose VIS Date Route   Moderna COVID-19 Vaccine 02/24/2020  4:31 PM 0.5 mL 11/27/2019 Intramuscular   Manufacturer: Moderna   Lot: [id number]   [place]: [phone]

## 2020-02-28 DIAGNOSIS — Z87891 Personal history of nicotine dependence: Secondary | ICD-10-CM | POA: Diagnosis not present

## 2020-02-28 DIAGNOSIS — I700 Atherosclerosis of aorta: Secondary | ICD-10-CM | POA: Diagnosis not present

## 2020-02-28 DIAGNOSIS — I10 Essential (primary) hypertension: Secondary | ICD-10-CM | POA: Diagnosis not present

## 2020-02-28 DIAGNOSIS — M549 Dorsalgia, unspecified: Secondary | ICD-10-CM | POA: Diagnosis not present

## 2020-02-28 DIAGNOSIS — C3490 Malignant neoplasm of unspecified part of unspecified bronchus or lung: Secondary | ICD-10-CM | POA: Diagnosis not present

## 2020-02-28 DIAGNOSIS — Z6824 Body mass index (BMI) 24.0-24.9, adult: Secondary | ICD-10-CM | POA: Diagnosis not present

## 2020-02-28 DIAGNOSIS — Z299 Encounter for prophylactic measures, unspecified: Secondary | ICD-10-CM | POA: Diagnosis not present

## 2020-03-12 ENCOUNTER — Other Ambulatory Visit (HOSPITAL_COMMUNITY): Payer: Self-pay | Admitting: Surgery

## 2020-03-12 ENCOUNTER — Other Ambulatory Visit: Payer: Self-pay

## 2020-03-12 ENCOUNTER — Inpatient Hospital Stay (HOSPITAL_COMMUNITY): Payer: Medicare Other | Attending: Hematology

## 2020-03-12 ENCOUNTER — Encounter (HOSPITAL_COMMUNITY): Payer: Self-pay

## 2020-03-12 DIAGNOSIS — Z452 Encounter for adjustment and management of vascular access device: Secondary | ICD-10-CM | POA: Diagnosis not present

## 2020-03-12 DIAGNOSIS — C3411 Malignant neoplasm of upper lobe, right bronchus or lung: Secondary | ICD-10-CM

## 2020-03-12 MED ORDER — HEPARIN SOD (PORK) LOCK FLUSH 100 UNIT/ML IV SOLN
500.0000 [IU] | Freq: Once | INTRAVENOUS | Status: AC
  Administered 2020-03-12: 500 [IU] via INTRAVENOUS

## 2020-03-12 MED ORDER — SODIUM CHLORIDE 0.9% FLUSH
10.0000 mL | Freq: Once | INTRAVENOUS | Status: AC
  Administered 2020-03-12: 10 mL via INTRAVENOUS

## 2020-03-12 NOTE — Progress Notes (Signed)
Patients port flushed without difficulty.  No blood return but pt had no pain with flushing and no bruising or swelling noted at site.  Band aid applied.  VSS with discharge and left ambulatory with no s/s of distress noted

## 2020-03-13 MED ORDER — TRAMADOL HCL 50 MG PO TABS: 50.0000 mg | ORAL_TABLET | Freq: Four times a day (QID) | ORAL | 0 refills | Status: DC | PRN

## 2020-03-21 ENCOUNTER — Other Ambulatory Visit (HOSPITAL_COMMUNITY): Payer: Self-pay | Admitting: Nurse Practitioner

## 2020-03-21 DIAGNOSIS — C3411 Malignant neoplasm of upper lobe, right bronchus or lung: Secondary | ICD-10-CM

## 2020-03-26 DIAGNOSIS — I10 Essential (primary) hypertension: Secondary | ICD-10-CM | POA: Diagnosis not present

## 2020-03-29 ENCOUNTER — Ambulatory Visit: Payer: Medicare Other | Attending: Internal Medicine

## 2020-03-29 DIAGNOSIS — Z23 Encounter for immunization: Secondary | ICD-10-CM

## 2020-03-29 NOTE — Progress Notes (Signed)
   Covid-19 Vaccination Clinic  Name:  Emily Pope    MRN: [id number] DOB: 03-13-1956  03/29/2020  [name] was observed post Covid-19 immunization for 15 minutes without incident. She was provided with Vaccine Information Sheet and instruction to access the V-Safe system.   [name] was instructed to call 911 with any severe reactions post vaccine: Marland Kitchen Difficulty breathing  . Swelling of face and throat  . A fast heartbeat  . A bad rash all over body  . Dizziness and weakness   Immunizations Administered    Name Date Dose VIS Date Route   Moderna COVID-19 Vaccine 03/29/2020 11:16 AM 0.5 mL 11/27/2019 Intramuscular   Manufacturer: Moderna   Lot: [id number]   [place]: [id number]

## 2020-04-03 DIAGNOSIS — I700 Atherosclerosis of aorta: Secondary | ICD-10-CM | POA: Diagnosis not present

## 2020-04-03 DIAGNOSIS — I10 Essential (primary) hypertension: Secondary | ICD-10-CM | POA: Diagnosis not present

## 2020-04-03 DIAGNOSIS — K1379 Other lesions of oral mucosa: Secondary | ICD-10-CM | POA: Diagnosis not present

## 2020-04-03 DIAGNOSIS — C3490 Malignant neoplasm of unspecified part of unspecified bronchus or lung: Secondary | ICD-10-CM | POA: Diagnosis not present

## 2020-04-03 DIAGNOSIS — Z299 Encounter for prophylactic measures, unspecified: Secondary | ICD-10-CM | POA: Diagnosis not present

## 2020-04-18 ENCOUNTER — Other Ambulatory Visit (HOSPITAL_COMMUNITY): Payer: Self-pay | Admitting: Nurse Practitioner

## 2020-04-18 DIAGNOSIS — C3411 Malignant neoplasm of upper lobe, right bronchus or lung: Secondary | ICD-10-CM

## 2020-05-23 ENCOUNTER — Other Ambulatory Visit (HOSPITAL_COMMUNITY): Payer: Self-pay | Admitting: Nurse Practitioner

## 2020-05-23 DIAGNOSIS — C3411 Malignant neoplasm of upper lobe, right bronchus or lung: Secondary | ICD-10-CM

## 2020-05-26 DIAGNOSIS — I10 Essential (primary) hypertension: Secondary | ICD-10-CM | POA: Diagnosis not present

## 2020-06-06 ENCOUNTER — Other Ambulatory Visit (HOSPITAL_COMMUNITY): Payer: Self-pay | Admitting: *Deleted

## 2020-06-06 DIAGNOSIS — C3411 Malignant neoplasm of upper lobe, right bronchus or lung: Secondary | ICD-10-CM

## 2020-06-09 ENCOUNTER — Inpatient Hospital Stay (HOSPITAL_COMMUNITY): Payer: Medicare Other | Attending: Hematology

## 2020-06-09 ENCOUNTER — Other Ambulatory Visit: Payer: Self-pay

## 2020-06-09 ENCOUNTER — Ambulatory Visit (HOSPITAL_COMMUNITY)
Admission: RE | Admit: 2020-06-09 | Discharge: 2020-06-09 | Disposition: A | Discharge status: Home / Self Care | Payer: Medicare Other | Source: Ambulatory Visit | Attending: Hematology | Admitting: Hematology

## 2020-06-09 DIAGNOSIS — Z7982 Long term (current) use of aspirin: Secondary | ICD-10-CM | POA: Insufficient documentation

## 2020-06-09 DIAGNOSIS — Z801 Family history of malignant neoplasm of trachea, bronchus and lung: Secondary | ICD-10-CM | POA: Insufficient documentation

## 2020-06-09 DIAGNOSIS — E559 Vitamin D deficiency, unspecified: Secondary | ICD-10-CM | POA: Diagnosis not present

## 2020-06-09 DIAGNOSIS — I10 Essential (primary) hypertension: Secondary | ICD-10-CM | POA: Diagnosis not present

## 2020-06-09 DIAGNOSIS — Z90710 Acquired absence of both cervix and uterus: Secondary | ICD-10-CM | POA: Insufficient documentation

## 2020-06-09 DIAGNOSIS — D509 Iron deficiency anemia, unspecified: Secondary | ICD-10-CM | POA: Diagnosis not present

## 2020-06-09 DIAGNOSIS — J440 Chronic obstructive pulmonary disease with acute lower respiratory infection: Secondary | ICD-10-CM | POA: Insufficient documentation

## 2020-06-09 DIAGNOSIS — Z87891 Personal history of nicotine dependence: Secondary | ICD-10-CM | POA: Insufficient documentation

## 2020-06-09 DIAGNOSIS — F419 Anxiety disorder, unspecified: Secondary | ICD-10-CM | POA: Insufficient documentation

## 2020-06-09 DIAGNOSIS — Z9221 Personal history of antineoplastic chemotherapy: Secondary | ICD-10-CM | POA: Diagnosis not present

## 2020-06-09 DIAGNOSIS — C3411 Malignant neoplasm of upper lobe, right bronchus or lung: Secondary | ICD-10-CM | POA: Diagnosis not present

## 2020-06-09 DIAGNOSIS — Z8249 Family history of ischemic heart disease and other diseases of the circulatory system: Secondary | ICD-10-CM | POA: Insufficient documentation

## 2020-06-09 DIAGNOSIS — Z833 Family history of diabetes mellitus: Secondary | ICD-10-CM | POA: Insufficient documentation

## 2020-06-09 DIAGNOSIS — Z923 Personal history of irradiation: Secondary | ICD-10-CM | POA: Diagnosis not present

## 2020-06-09 DIAGNOSIS — Z79899 Other long term (current) drug therapy: Secondary | ICD-10-CM | POA: Diagnosis not present

## 2020-06-09 LAB — CBC WITH DIFFERENTIAL/PLATELET
Abs Immature Granulocytes: 0.02 10*3/uL (ref 0.00–0.07)
Basophils Absolute: 0.1 10*3/uL (ref 0.0–0.1)
Basophils Relative: 1 %
Eosinophils Absolute: 0.3 10*3/uL (ref 0.0–0.5)
Eosinophils Relative: 5 %
HCT: 38.2 % (ref 36.0–46.0)
Hemoglobin: 12.4 g/dL (ref 12.0–15.0)
Immature Granulocytes: 0 %
Lymphocytes Relative: 20 %
Lymphs Abs: 1.1 10*3/uL (ref 0.7–4.0)
MCH: 33.2 pg (ref 26.0–34.0)
MCHC: 32.5 g/dL (ref 30.0–36.0)
MCV: 102.4 fL — ABNORMAL HIGH (ref 80.0–100.0)
Monocytes Absolute: 0.5 10*3/uL (ref 0.1–1.0)
Monocytes Relative: 8 %
Neutro Abs: 3.7 10*3/uL (ref 1.7–7.7)
Neutrophils Relative %: 66 %
Platelets: 305 10*3/uL (ref 150–400)
RBC: 3.73 MIL/uL — ABNORMAL LOW (ref 3.87–5.11)
RDW: 12.3 % (ref 11.5–15.5)
WBC: 5.7 10*3/uL (ref 4.0–10.5)
nRBC: 0 % (ref 0.0–0.2)

## 2020-06-09 LAB — COMPREHENSIVE METABOLIC PANEL
ALT: 21 U/L (ref 0–44)
AST: 19 U/L (ref 15–41)
Albumin: 4.2 g/dL (ref 3.5–5.0)
Alkaline Phosphatase: 87 U/L (ref 38–126)
Anion gap: 10 (ref 5–15)
BUN: 22 mg/dL (ref 8–23)
CO2: 25 mmol/L (ref 22–32)
Calcium: 9 mg/dL (ref 8.9–10.3)
Chloride: 100 mmol/L (ref 98–111)
Creatinine, Ser: 1 mg/dL (ref 0.44–1.00)
GFR calc Af Amer: >60 mL/min (ref >60)
GFR calc non Af Amer: 60 mL/min — ABNORMAL LOW (ref >60)
Glucose, Bld: 88 mg/dL (ref 70–99)
Potassium: 4.1 mmol/L (ref 3.5–5.1)
Sodium: 135 mmol/L (ref 135–145)
Total Bilirubin: 0.3 mg/dL (ref 0.3–1.2)
Total Protein: 7.2 g/dL (ref 6.5–8.1)

## 2020-06-09 LAB — VITAMIN D 25 HYDROXY (VIT D DEFICIENCY, FRACTURES): Vit D, 25-Hydroxy: 22.66 ng/mL — ABNORMAL LOW (ref 30–100)

## 2020-06-09 MED ORDER — IOHEXOL 300 MG/ML  SOLN
75.0000 mL | Freq: Once | INTRAMUSCULAR | Status: AC | PRN
  Administered 2020-06-09: 75 mL via INTRAVENOUS

## 2020-06-12 ENCOUNTER — Inpatient Hospital Stay (HOSPITAL_COMMUNITY): Payer: Medicare Other

## 2020-06-12 ENCOUNTER — Inpatient Hospital Stay (HOSPITAL_BASED_OUTPATIENT_CLINIC_OR_DEPARTMENT_OTHER): Payer: Medicare Other | Admitting: Nurse Practitioner

## 2020-06-12 ENCOUNTER — Other Ambulatory Visit: Payer: Self-pay

## 2020-06-12 DIAGNOSIS — Z801 Family history of malignant neoplasm of trachea, bronchus and lung: Secondary | ICD-10-CM | POA: Diagnosis not present

## 2020-06-12 DIAGNOSIS — Z833 Family history of diabetes mellitus: Secondary | ICD-10-CM | POA: Diagnosis not present

## 2020-06-12 DIAGNOSIS — C3411 Malignant neoplasm of upper lobe, right bronchus or lung: Secondary | ICD-10-CM | POA: Diagnosis not present

## 2020-06-12 DIAGNOSIS — E559 Vitamin D deficiency, unspecified: Secondary | ICD-10-CM | POA: Diagnosis not present

## 2020-06-12 DIAGNOSIS — Z923 Personal history of irradiation: Secondary | ICD-10-CM | POA: Diagnosis not present

## 2020-06-12 DIAGNOSIS — Z9221 Personal history of antineoplastic chemotherapy: Secondary | ICD-10-CM | POA: Diagnosis not present

## 2020-06-12 DIAGNOSIS — I10 Essential (primary) hypertension: Secondary | ICD-10-CM | POA: Diagnosis not present

## 2020-06-12 DIAGNOSIS — Z7982 Long term (current) use of aspirin: Secondary | ICD-10-CM | POA: Diagnosis not present

## 2020-06-12 DIAGNOSIS — D509 Iron deficiency anemia, unspecified: Secondary | ICD-10-CM | POA: Diagnosis not present

## 2020-06-12 DIAGNOSIS — Z8249 Family history of ischemic heart disease and other diseases of the circulatory system: Secondary | ICD-10-CM | POA: Diagnosis not present

## 2020-06-12 DIAGNOSIS — Z79899 Other long term (current) drug therapy: Secondary | ICD-10-CM | POA: Diagnosis not present

## 2020-06-12 DIAGNOSIS — Z87891 Personal history of nicotine dependence: Secondary | ICD-10-CM | POA: Diagnosis not present

## 2020-06-12 DIAGNOSIS — J440 Chronic obstructive pulmonary disease with acute lower respiratory infection: Secondary | ICD-10-CM | POA: Diagnosis not present

## 2020-06-12 MED ORDER — HEPARIN SOD (PORK) LOCK FLUSH 100 UNIT/ML IV SOLN
500.0000 [IU] | Freq: Once | INTRAVENOUS | Status: AC
  Administered 2020-06-12: 500 [IU] via INTRAVENOUS

## 2020-06-12 MED ORDER — TRAMADOL HCL 50 MG PO TABS: 50.0000 mg | ORAL_TABLET | Freq: Four times a day (QID) | ORAL | 0 refills | Status: DC | PRN

## 2020-06-12 MED ORDER — SODIUM CHLORIDE 0.9% FLUSH
10.0000 mL | INTRAVENOUS | Status: DC | PRN
  Administered 2020-06-12: 10 mL via INTRAVENOUS

## 2020-06-12 NOTE — Assessment & Plan Note (Addendum)
1.  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 -Durvalumab consolidation from 01/25/2017, discontinued on 05/09/2017 secondary to pneumonitis. -PET scan on 11/20/2018 showed further reduction in activity associated with the right apical consolidation, likely treatment related, without current evidence of malignancy.  Chronically stable 7 mm nodule in the right lateral costophrenic angle without current hypermetabolic activity. -CT of the chest on 06/01/2019 which showed treatment effect with the right upper lobe with no evidence of local recurrence or residual disease. -CT chest 12/11/2019 showed no changes in right apical consolidation.  A new 3 mm left upper lobe nodule is new since the prior exam.  If new left upper lobe nodule has increased in size this would warrant a PET CT followed by possible biopsy if FDG avid. -CT of the chest done on 06/09/2020 showed stable appearance of posttreatment changes involving the right upper lobe with compensatory hyperinflation of the right middle lobe and right lower lobe.  Stable subpleural nodules in the right lower lobe and 3 mm left upper lobe lung nodule.  Previously noted 5 mm  peribronchovascular nodule now measures 3 mm and is likely postinflammatory. -Labs done on 06/09/2020 were all WNL. -She will follow-up in 6 months with repeat labs and CT scan.  2.  COPD/pneumonitis: -She is using ventolin and trelegy.  She is also taking Singulair. -She is still coughing up thick yellow mucus.  She has been doing this for a year now.  3.  Vitamin D deficiency: -Labs done on 06/09/2020 showed vitamin D level 22.66. -She stopped taking the combination vitamin D and calcium due to constipation. -She will start taking  an vitamin D3 by itself. -We will recheck her labs at her next visit.  4.  Iron deficiency anemia: -She is taking oral iron tablets daily. -Labs done on 06/09/2020 showed hemoglobin 12.4.

## 2020-06-12 NOTE — Progress Notes (Signed)
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  S/P concomitant chemoradiation with carboplatin/paclitaxel (09/20/2016- 10/25/2016) followed by 2 cycle of consolidative chemotherapy with carboplatin/paclitaxel (11/15/2016- 12/06/2016).  Unable to tolerate consolidative durvalumab (Imfinzi) beginning on 01/26/2016 and stopped 05/2017.   AND Imfinzi-induced pneumonitis, grade 2, resulting in holding Imfinzi from 04/25/2017- 05/09/2017 with corticosteroids treatment with taper.  This resolved/improved with steroids. Developed pnemonitis again after rechallenge with imfinzi. On steroid taper again.     Primary cancer of right upper lobe of lung ([place])  08/04/2016 PET scan   PET IMPRESSION: 1. 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 Mass is confluent with the right superior hilum and right lower tracheal wall, suggestive of a T4 primary tumor. 2. Postobstructive pneumonia in the right upper lobe. 3. Hypermetabolic ipsilateral and contralateral mediastinal and contralateral hilar lymphadenopathy, suggestive of N3 nodal disease.   08/24/2016 Procedure   Bronchoscopy   08/25/2016 Pathology Results   Diagnosis Endobronchial biopsy, RUL - POORLY DIFFERENTIATED NON-SMALL CELL CARCINOMA. The immunophenotype is consistent with poorly differentiated adenocarcinoma.   09/10/2016 Imaging   MRI brain- No metastatic disease or acute intracranial abnormality.   09/20/2016 - 10/25/2016 Chemotherapy    Carboplatin/Paclitaxel weekly x 6 with XRT.   09/20/2016 - 10/25/2016 Radiation Therapy     11/15/2016 - 12/06/2016 Chemotherapy   Carboplatin/paclitaxel every 21 days x 2 cycles.   12/30/2016 Imaging   CT CAP- 1. Central right upper lobe lung mass has mildly decreased since 11/21/2016 chest CT angiogram and significantly decreased since 08/04/2016 PET-CT. 2. No pathologically enlarged thoracic lymph nodes. Previously described hypermetabolic thoracic adenopathy on the 08/04/2016 PET-CT has decreased in size. 3. Previously described bilateral lower lobe pulmonary nodules are stable in size. 4. New solitary subsolid 4 mm right middle lobe pulmonary nodule is nonspecific, and may be inflammatory. Recommend attention on a follow-up chest CT in 3 months. 5. No definite metastatic disease in the abdomen. Tiny sub 5 mm low-attenuation lesions in the liver are too small to characterize, favor benign. 6. Aortic atherosclerosis. One vessel coronary atherosclerosis. 7. Moderate emphysema.   01/25/2017 -  Chemotherapy   Durvalumab (Imfinzi) x 52 weeks.    03/31/2017 Imaging   CT chest with contrast: Stable size of partially calcified mass in the posterior right upper lobe.  Increased airspace disease in paramediastinal right upper and superior right lower lobes. Differential diagnosis includes radiation pneumonitis, drug reaction, and infection.  Stable sub-cm right lower lobe pulmonary nodule.  No evidence of lymphadenopathy or pleural effusion.  Emphysema.  Aortic and coronary artery atherosclerosis.   04/25/2017 Adverse Reaction   Progressive cough, concerning for pneumonitis.   04/25/2017 Treatment Plan Change   Progressive cough concerning for pneumonitis.  Treatment held.  Managed by steroids.   05/09/2017 Treatment Plan Change   Restart Imfinzi with improvement of cough, Grade 1.   05/27/2017 Imaging   CT CHEST WITH CONTRAST IMPRESSION: 1. Similar size of partially  calcified posterior right apical lung mass. 2. Similar to minimal increase in surrounding radiation fibrosis. 3. Subtle areas  of mosaic attenuation are greater on the right. Likely attributed to mild ground-glass opacity. In the appropriate clinical setting, this could represent drug toxicity induced pneumonitis. 4.  Coronary artery atherosclerosis. Aortic atherosclerosis. 5. Similar right lower lobe pulmonary nodule.   05/27/2017 Adverse Reaction   Developed worsening cough and SOB again after imfinzi. Grade 2. Placed on long steroid taper.  Permanently discontinue any further imfinzi treatments.   07/29/2017 Imaging   CT chest/abd/pelvis: IMPRESSION: 1. Stable post treatment related changes of mass-like radiation fibrosis in the upper right lung with similar appearance of densely calcified mass in the apex of the right upper lobe. No finding to suggest local recurrence of disease or metastatic disease in the chest, abdomen or pelvis on today's examination. 2. 7 mm subpleural nodule in the periphery of the right lower lobe is stable and favored to be benign. Continued attention on followup studies is recommended to ensure stability. 3. Aortic atherosclerosis, in addition to left anterior descending coronary artery disease. Please note that although the presence of coronary artery calcium documents the presence of coronary artery disease, the severity of this disease and any potential stenosis cannot be assessed on this non-gated CT examination. Assessment for potential risk factor modification, dietary therapy or pharmacologic therapy may be warranted, if clinically indicated. 4. There are calcifications of the mitral valve. Echocardiographic correlation for evaluation of potential valvular dysfunction may be warranted if clinically indicated. 5. Mild diffuse bronchial wall thickening with mild to moderate centrilobular and mild paraseptal emphysema; imaging findings suggestive of  underlying COPD. 6. Tip of left subclavian Port-A-Cath is now misdirected into the subclavian vein.     CANCER STAGING: Cancer Staging Primary cancer of right upper lobe of lung ([place]) Staging form: Lung, AJCC 7th Edition - Clinical stage from 09/27/2016: Stage IIIB (T4, N3, M0) - Signed by Baird Cancer, PA-C on 09/27/2016    INTERVAL HISTORY:  Ms. Tapscott 64 y.o. female returns for routine follow-up for lung cancer.  Patient reports she is still coughing up thick yellow mucus.  She has been doing this for over a year now.  She denies any fevers.  She denies any green sputum.  Otherwise she is normally short of breath with exertion.  She uses her inhalers as needed. Denies any nausea, vomiting, or diarrhea. Denies any new pains. Had not noticed any recent bleeding such as epistaxis, hematuria or hematochezia. Denies recent chest pain on exertion, pre-syncopal episodes, or palpitations. Denies any numbness or tingling in hands or feet. Denies any recent fevers, infections, or recent hospitalizations. Patient reports appetite at 75% and energy level at 25%.  She is eating well maintain her weight at this time.    REVIEW OF SYSTEMS:  Review of Systems  HENT:   Positive for trouble swallowing.   Respiratory: Positive for cough, shortness of breath and wheezing.   Neurological: Positive for numbness.  Psychiatric/Behavioral: Positive for sleep disturbance.  All other systems reviewed and are negative.    PAST MEDICAL/SURGICAL HISTORY:  Past Medical History:  Diagnosis Date  . Anxiety   . Arthritis   . Chronic bronchitis ([place])   . COPD (chronic obstructive pulmonary disease) ([place])   . Essential hypertension   . GERD (gastroesophageal reflux disease)   . Headache   . History of pneumonia   . Pneumonitis   . Primary cancer of right upper lobe of lung ([place])    Stage IIIb adenocarcinoma   Past Surgical History:  Procedure Laterality Date  . ABDOMINAL HYSTERECTOMY    .  APPENDECTOMY     when had hysterectomy  . CARPAL TUNNEL RELEASE Bilateral   . ENDOBRONCHIAL ULTRASOUND Bilateral 08/23/2016   Procedure: ENDOBRONCHIAL ULTRASOUND;  Surgeon: Collene Gobble, MD;  Location: [facility];  Service: Cardiopulmonary;  Laterality: Bilateral;  . HERNIA REPAIR     left inguinal hernia age 62  . PORTACATH PLACEMENT Left 09/17/2016   Procedure: INSERTION PORT-A-CATH LEFT SUBCLAVIAN;  Surgeon: Aviva Signs, MD;  Location: [facility];  Service: General;  Laterality: Left;  . TUBAL LIGATION       SOCIAL HISTORY:  Social History   Socioeconomic History  . Marital status: Divorced    Spouse name: Not on file  . Number of children: Not on file  . Years of education: Not on file  . Highest education level: Not on file  Occupational History  . Not on file  Tobacco Use  . Smoking status: Former Smoker    Packs/day: 2.00    Years: 43.00    Pack years: 86.00    Quit date: 07/20/2016    Years since quitting: 3.8  . Smokeless tobacco: Never Used  Vaping Use  . Vaping Use: Never used  Substance and Sexual Activity  . Alcohol use: No  . Drug use: No  . Sexual activity: Not on file  Other Topics Concern  . Not on file  Social History Narrative  . Not on file   Social Determinants of Health   Financial Resource Strain:   . Difficulty of Paying Living Expenses:   Food Insecurity:   . Worried About Charity fundraiser in the Last Year:   . Arboriculturist in the Last Year:   Transportation Needs:   . Film/video editor (Medical):   Marland Kitchen Lack of Transportation (Non-Medical):   Physical Activity:   . Days of Exercise per Week:   . Minutes of Exercise per Session:   Stress:   . Feeling of Stress :   Social Connections:   . Frequency of Communication with Friends and Family:   . Frequency of Social Gatherings with Friends and Family:   . Attends Religious Services:   . Active Member of Clubs or Organizations:   . Attends Archivist Meetings:   Marland Kitchen  Marital Status:   Intimate Partner Violence:   . Fear of Current or Ex-Partner:   . Emotionally Abused:   Marland Kitchen Physically Abused:   . Sexually Abused:     FAMILY HISTORY:  Family History  Problem Relation Age of Onset  . Hypertension Mother   . Lung cancer Mother   . Hypertension Father   . Diabetes Father   . Hypertension Sister   . Lung cancer Brother   . Hypertension Sister   . Colon cancer Neg Hx     CURRENT MEDICATIONS:  Outpatient Encounter Medications as of 06/12/2020  Medication Sig  . aspirin EC 81 MG tablet Take 81 mg by mouth daily.  Marland Kitchen buPROPion (WELLBUTRIN XL) 150 MG 24 hr tablet Take 150 mg by mouth 2 (two) times daily.  Marland Kitchen docusate sodium (COLACE) 100 MG capsule Take 100 mg by mouth 2 (two) times daily.  . ferrous sulfate 325 (65 FE) MG tablet Take 325 mg by mouth daily with breakfast.  . folic acid (FOLVITE) 1 MG tablet Take 1 tablet by mouth once daily  . hydrochlorothiazide (HYDRODIURIL) 12.5 MG tablet Take 12.5 mg by mouth daily.   Marland Kitchen levocetirizine (XYZAL) 5 MG tablet Take 5 mg by mouth daily.  Marland Kitchen  loratadine (CLARITIN) 10 MG tablet Take 10 mg by mouth daily as needed.   Marland Kitchen losartan (COZAAR) 50 MG tablet Take 50 mg by mouth daily.   . Melatonin 10 MG TABS Take 1 tablet by mouth at bedtime.  . TRELEGY ELLIPTA 100-62.5-25 MCG/INH AEPB Take 1 puff by mouth daily.   . vitamin B-12 (CYANOCOBALAMIN) 1000 MCG tablet Take 2,000 mcg by mouth daily.   Marland Kitchen albuterol (PROVENTIL HFA;VENTOLIN HFA) 108 (90 Base) MCG/ACT inhaler Inhale 2 puffs into the lungs every 6 (six) hours as needed for wheezing or shortness of breath. (Patient not taking: Reported on 06/12/2020)  . lidocaine-prilocaine (EMLA) cream Apply 1 application topically as needed. (Patient not taking: Reported on 06/12/2020)  . traMADol (ULTRAM) 50 MG tablet Take 1 tablet (50 mg total) by mouth every 6 (six) hours as needed. for pain  . [DISCONTINUED] folic acid (FOLVITE) 1 MG tablet Take 1 tablet by mouth once daily  .  [DISCONTINUED] traMADol (ULTRAM) 50 MG tablet Take 1 tablet (50 mg total) by mouth every 6 (six) hours as needed. for pain (Patient not taking: Reported on 06/12/2020)   No facility-administered encounter medications on file as of 06/12/2020.    ALLERGIES:  Allergies  Allergen Reactions  . Clindamycin/Lincomycin Rash  . Codeine Anaphylaxis  . Lincomycin Rash    Also Clindamycin as it has lincomycin in it Also Clindamycin as it has lincomycin in it  . Other Rash  . Penicillins Anaphylaxis    Has patient had a PCN reaction causing immediate rash, facial/tongue/throat swelling, SOB or lightheadedness with hypotension: Unknown Has patient had a PCN reaction causing severe rash involving mucus membranes or skin necrosis: Unknown Has patient had a PCN reaction that required hospitalization: No Has patient had a PCN reaction occurring within the last 10 years: No If all of the above answers are "NO", then may proceed with Cephalosporin use. Has patient had a PCN reaction causing immediate rash, facial/tongue/throat swelling, SOB or lightheadedness with hypotension: Unknown Has patient had a PCN reaction causing severe rash involving mucus membranes or skin necrosis: Unknown Has patient had a PCN reaction that required hospitalization: No Has patient had a PCN reaction occurring within the last 10 years: No If all of the above answers are "NO", then may proceed with Cephalosporin use.   . Vancomycin Itching    Scalp itching  . Hydrocodone     RASH  . Sulfa Antibiotics Nausea Only     PHYSICAL EXAM:  ECOG Performance status: 1  Vitals:   06/12/20 1015  BP: 140/76  Pulse: 83  Resp: 17  Temp: (!) 96 F (35.6 C)  SpO2: 100%   Filed Weights   06/12/20 1015  Weight: 126 lb 6.4 oz (57.3 kg)   Physical Exam Constitutional:      Appearance: Normal appearance. She is normal weight.  Cardiovascular:     Rate and Rhythm: Normal rate and regular rhythm.     Heart sounds: Normal heart  sounds.  Pulmonary:     Effort: Pulmonary effort is normal.     Breath sounds: Wheezing present.  Abdominal:     General: Bowel sounds are normal.     Palpations: Abdomen is soft.  Musculoskeletal:        General: Normal range of motion.  Skin:    General: Skin is warm.  Neurological:     Mental Status: She is alert and oriented to person, place, and time. Mental status is at baseline.  Psychiatric:  Mood and Affect: Mood normal.        Behavior: Behavior normal.        Thought Content: Thought content normal.        Judgment: Judgment normal.      LABORATORY DATA:  I have reviewed the labs as listed.  CBC    Component Value Date/Time   WBC 5.7 06/09/2020 1259   RBC 3.73 (L) 06/09/2020 1259   HGB 12.4 06/09/2020 1259   HGB 12.9 09/02/2016 1403   HCT 38.2 06/09/2020 1259   HCT 38.4 09/02/2016 1403   PLT 305 06/09/2020 1259   PLT 476 (H) 09/02/2016 1403   MCV 102.4 (H) 06/09/2020 1259   MCV 94.6 09/02/2016 1403   MCH 33.2 06/09/2020 1259   MCHC 32.5 06/09/2020 1259   RDW 12.3 06/09/2020 1259   RDW 13.6 09/02/2016 1403   LYMPHSABS 1.1 06/09/2020 1259   LYMPHSABS 2.5 09/02/2016 1403   MONOABS 0.5 06/09/2020 1259   MONOABS 0.7 09/02/2016 1403   EOSABS 0.3 06/09/2020 1259   EOSABS 0.7 (H) 09/02/2016 1403   BASOSABS 0.1 06/09/2020 1259   BASOSABS 0.1 09/02/2016 1403   CMP Latest Ref Rng & Units 06/09/2020 12/11/2019 06/01/2019  Glucose 70 - 99 mg/dL 88 89 87  BUN 8 - 23 mg/dL 22 16 19   Creatinine 0.44 - 1.00 mg/dL 1.00 0.89 0.87  Sodium 135 - 145 mmol/L 135 139 139  Potassium 3.5 - 5.1 mmol/L 4.1 4.4 3.8  Chloride 98 - 111 mmol/L 100 102 102  CO2 22 - 32 mmol/L 25 29 25   Calcium 8.9 - 10.3 mg/dL 9.0 9.2 9.1  Total Protein 6.5 - 8.1 g/dL 7.2 7.4 7.6  Total Bilirubin 0.3 - 1.2 mg/dL 0.3 0.7 0.4  Alkaline Phos 38 - 126 U/L 87 89 96  AST 15 - 41 U/L 19 15 19   ALT 0 - 44 U/L 21 11 22     DIAGNOSTIC IMAGING:  I have independently reviewed the CT scans and  discussed with the patient.  ASSESSMENT & PLAN:  Primary cancer of right upper lobe of lung ([place]) 1.  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 -Durvalumab consolidation from 01/25/2017, discontinued on 05/09/2017 secondary to pneumonitis. -PET scan on 11/20/2018 showed further reduction in activity associated with the right apical consolidation, likely treatment related, without current evidence of malignancy.  Chronically stable 7 mm nodule in the right lateral costophrenic angle without current hypermetabolic activity. -CT of the chest on 06/01/2019 which showed treatment effect with the right upper lobe with no evidence of local recurrence or residual disease. -CT chest 12/11/2019 showed no changes in right apical consolidation.  A new 3 mm left upper lobe nodule is new since the prior exam.  If new left upper lobe nodule has increased in size this would warrant a PET CT followed by possible biopsy if FDG avid. -CT of the chest done on 06/09/2020 showed stable appearance of posttreatment changes involving the right upper lobe with compensatory hyperinflation of the right middle lobe and right lower lobe.  Stable subpleural nodules in the right lower lobe and 3 mm left upper lobe lung nodule.  Previously noted 5 mm  peribronchovascular nodule now measures 3 mm and is likely postinflammatory. -Labs done on 06/09/2020 were all WNL. -She will follow-up in 6 months with  repeat labs and CT scan.  2.  COPD/pneumonitis: -She is using ventolin and trelegy.  She is also taking Singulair. -She is still coughing up thick yellow mucus.  She has been doing this for a year now.  3.  Vitamin D deficiency: -Labs done on 06/09/2020 showed vitamin D level  22.66. -She stopped taking the combination vitamin D and calcium due to constipation. -She will start taking an vitamin D3 by itself. -We will recheck her labs at her next visit.  4.  Iron deficiency anemia: -She is taking oral iron tablets daily. -Labs done on 06/09/2020 showed hemoglobin 12.4.     Orders placed this encounter:  Orders Placed This Encounter  Procedures  . CT CHEST W CONTRAST  . Lactate dehydrogenase  . CBC with Differential/Platelet  . Comprehensive metabolic panel  . Ferritin  . Iron and TIBC  . Vitamin B12  . VITAMIN D 25 Hydroxy (Vit-D Deficiency, Fractures)      Francene Finders, FNP-C [facility] [phone]

## 2020-06-12 NOTE — Patient Instructions (Signed)
[facility] at [facility] Discharge Instructions  Follow up in 6 months with labs    Thank you for choosing [facility] at [facility] to provide your oncology and hematology care.  To afford each patient quality time with our provider, please arrive at least 15 minutes before your scheduled appointment time.   If you have a lab appointment with the Cancer Center please come in thru the Main Entrance and check in at the main information desk.  You need to re-schedule your appointment should you arrive 10 or more minutes late.  We strive to give you quality time with our providers, and arriving late affects you and other patients whose appointments are after yours.  Also, if you no show three or more times for appointments you may be dismissed from the clinic at the providers discretion.     Again, thank you for choosing [facility].  Our hope is that these requests will decrease the amount of time that you wait before being seen by our physicians.       _____________________________________________________________  Should you have questions after your visit to [facility], please contact our office at [phone] between the hours of 8:00 a.m. and 4:30 p.m.  Voicemails left after 4:00 p.m. will not be returned until the following business day.  For prescription refill requests, have your pharmacy contact our office and allow 72 hours.    Due to Covid, you will need to wear a mask upon entering the hospital. If you do not have a mask, a mask will be given to you at the Main Entrance upon arrival. For doctor visits, patients may have 1 support person with them. For treatment visits, patients can not have anyone with them due to social distancing guidelines and our immunocompromised population.      

## 2020-06-19 ENCOUNTER — Other Ambulatory Visit (HOSPITAL_COMMUNITY): Payer: Self-pay | Admitting: Nurse Practitioner

## 2020-06-19 DIAGNOSIS — C3411 Malignant neoplasm of upper lobe, right bronchus or lung: Secondary | ICD-10-CM

## 2020-06-24 DIAGNOSIS — Z0000 Encounter for general adult medical examination without abnormal findings: Secondary | ICD-10-CM | POA: Diagnosis not present

## 2020-06-24 DIAGNOSIS — Z7189 Other specified counseling: Secondary | ICD-10-CM | POA: Diagnosis not present

## 2020-06-24 DIAGNOSIS — Z79899 Other long term (current) drug therapy: Secondary | ICD-10-CM | POA: Diagnosis not present

## 2020-06-24 DIAGNOSIS — E559 Vitamin D deficiency, unspecified: Secondary | ICD-10-CM | POA: Diagnosis not present

## 2020-06-24 DIAGNOSIS — Z299 Encounter for prophylactic measures, unspecified: Secondary | ICD-10-CM | POA: Diagnosis not present

## 2020-06-24 DIAGNOSIS — Z1211 Encounter for screening for malignant neoplasm of colon: Secondary | ICD-10-CM | POA: Diagnosis not present

## 2020-06-24 DIAGNOSIS — I10 Essential (primary) hypertension: Secondary | ICD-10-CM | POA: Diagnosis not present

## 2020-06-24 DIAGNOSIS — R5383 Other fatigue: Secondary | ICD-10-CM | POA: Diagnosis not present

## 2020-06-25 DIAGNOSIS — I10 Essential (primary) hypertension: Secondary | ICD-10-CM | POA: Diagnosis not present

## 2020-07-02 DIAGNOSIS — Z08 Encounter for follow-up examination after completed treatment for malignant neoplasm: Secondary | ICD-10-CM | POA: Diagnosis not present

## 2020-07-02 DIAGNOSIS — Z85118 Personal history of other malignant neoplasm of bronchus and lung: Secondary | ICD-10-CM | POA: Diagnosis not present

## 2020-07-02 DIAGNOSIS — C3411 Malignant neoplasm of upper lobe, right bronchus or lung: Secondary | ICD-10-CM | POA: Diagnosis not present

## 2020-07-02 DIAGNOSIS — Z6824 Body mass index (BMI) 24.0-24.9, adult: Secondary | ICD-10-CM | POA: Diagnosis not present

## 2020-07-16 ENCOUNTER — Other Ambulatory Visit (HOSPITAL_COMMUNITY): Payer: Self-pay | Admitting: Nurse Practitioner

## 2020-07-16 DIAGNOSIS — C3411 Malignant neoplasm of upper lobe, right bronchus or lung: Secondary | ICD-10-CM

## 2020-07-24 DIAGNOSIS — I700 Atherosclerosis of aorta: Secondary | ICD-10-CM | POA: Diagnosis not present

## 2020-07-24 DIAGNOSIS — I10 Essential (primary) hypertension: Secondary | ICD-10-CM | POA: Diagnosis not present

## 2020-07-24 DIAGNOSIS — M549 Dorsalgia, unspecified: Secondary | ICD-10-CM | POA: Diagnosis not present

## 2020-07-24 DIAGNOSIS — C3490 Malignant neoplasm of unspecified part of unspecified bronchus or lung: Secondary | ICD-10-CM | POA: Diagnosis not present

## 2020-07-24 DIAGNOSIS — Z299 Encounter for prophylactic measures, unspecified: Secondary | ICD-10-CM | POA: Diagnosis not present

## 2020-07-24 DIAGNOSIS — J449 Chronic obstructive pulmonary disease, unspecified: Secondary | ICD-10-CM | POA: Diagnosis not present

## 2020-08-15 ENCOUNTER — Other Ambulatory Visit (HOSPITAL_COMMUNITY): Payer: Self-pay | Admitting: Nurse Practitioner

## 2020-08-15 DIAGNOSIS — C3411 Malignant neoplasm of upper lobe, right bronchus or lung: Secondary | ICD-10-CM

## 2020-08-26 DIAGNOSIS — I10 Essential (primary) hypertension: Secondary | ICD-10-CM | POA: Diagnosis not present

## 2020-09-12 ENCOUNTER — Other Ambulatory Visit: Payer: Self-pay

## 2020-09-12 ENCOUNTER — Inpatient Hospital Stay (HOSPITAL_COMMUNITY): Payer: Medicare Other | Attending: Hematology

## 2020-09-12 ENCOUNTER — Other Ambulatory Visit (HOSPITAL_COMMUNITY): Payer: Self-pay

## 2020-09-12 ENCOUNTER — Other Ambulatory Visit (HOSPITAL_COMMUNITY): Payer: Self-pay | Admitting: Nurse Practitioner

## 2020-09-12 DIAGNOSIS — R232 Flushing: Secondary | ICD-10-CM

## 2020-09-12 DIAGNOSIS — C3411 Malignant neoplasm of upper lobe, right bronchus or lung: Secondary | ICD-10-CM

## 2020-09-12 MED ORDER — LIDOCAINE-PRILOCAINE 2.5-2.5 % EX CREA: 1.0000 "application " | TOPICAL_CREAM | CUTANEOUS | 3 refills | Status: DC | PRN

## 2020-09-12 MED ORDER — GABAPENTIN 100 MG PO CAPS: 100.0000 mg | ORAL_CAPSULE | Freq: Three times a day (TID) | ORAL | 3 refills | Status: DC

## 2020-09-12 MED ORDER — TRAMADOL HCL 50 MG PO TABS: 50.0000 mg | ORAL_TABLET | Freq: Four times a day (QID) | ORAL | 0 refills | Status: DC | PRN

## 2020-09-17 ENCOUNTER — Other Ambulatory Visit (HOSPITAL_COMMUNITY): Payer: Self-pay | Admitting: Nurse Practitioner

## 2020-09-17 DIAGNOSIS — C3411 Malignant neoplasm of upper lobe, right bronchus or lung: Secondary | ICD-10-CM

## 2020-09-25 DIAGNOSIS — I10 Essential (primary) hypertension: Secondary | ICD-10-CM | POA: Diagnosis not present

## 2020-10-14 ENCOUNTER — Other Ambulatory Visit (HOSPITAL_COMMUNITY): Payer: Self-pay | Admitting: Surgery

## 2020-10-14 DIAGNOSIS — C3411 Malignant neoplasm of upper lobe, right bronchus or lung: Secondary | ICD-10-CM

## 2020-10-14 MED ORDER — FOLIC ACID 1 MG PO TABS: 1.0000 mg | ORAL_TABLET | Freq: Every day | ORAL | 0 refills | Status: DC

## 2020-10-21 DIAGNOSIS — J441 Chronic obstructive pulmonary disease with (acute) exacerbation: Secondary | ICD-10-CM | POA: Diagnosis not present

## 2020-10-21 DIAGNOSIS — I10 Essential (primary) hypertension: Secondary | ICD-10-CM | POA: Diagnosis not present

## 2020-10-21 DIAGNOSIS — C3490 Malignant neoplasm of unspecified part of unspecified bronchus or lung: Secondary | ICD-10-CM | POA: Diagnosis not present

## 2020-10-21 DIAGNOSIS — Z299 Encounter for prophylactic measures, unspecified: Secondary | ICD-10-CM | POA: Diagnosis not present

## 2020-10-21 DIAGNOSIS — I700 Atherosclerosis of aorta: Secondary | ICD-10-CM | POA: Diagnosis not present

## 2020-10-24 DIAGNOSIS — I10 Essential (primary) hypertension: Secondary | ICD-10-CM | POA: Diagnosis not present

## 2020-10-27 DIAGNOSIS — R0989 Other specified symptoms and signs involving the circulatory and respiratory systems: Secondary | ICD-10-CM | POA: Diagnosis not present

## 2020-10-27 DIAGNOSIS — I10 Essential (primary) hypertension: Secondary | ICD-10-CM | POA: Diagnosis not present

## 2020-10-27 DIAGNOSIS — Z299 Encounter for prophylactic measures, unspecified: Secondary | ICD-10-CM | POA: Diagnosis not present

## 2020-10-27 DIAGNOSIS — R059 Cough, unspecified: Secondary | ICD-10-CM | POA: Diagnosis not present

## 2020-10-27 DIAGNOSIS — J441 Chronic obstructive pulmonary disease with (acute) exacerbation: Secondary | ICD-10-CM | POA: Diagnosis not present

## 2020-10-27 DIAGNOSIS — R0602 Shortness of breath: Secondary | ICD-10-CM | POA: Diagnosis not present

## 2020-10-27 DIAGNOSIS — C3490 Malignant neoplasm of unspecified part of unspecified bronchus or lung: Secondary | ICD-10-CM | POA: Diagnosis not present

## 2020-10-27 DIAGNOSIS — J449 Chronic obstructive pulmonary disease, unspecified: Secondary | ICD-10-CM | POA: Diagnosis not present

## 2020-11-19 ENCOUNTER — Other Ambulatory Visit (HOSPITAL_COMMUNITY): Payer: Self-pay | Admitting: Hematology

## 2020-11-19 DIAGNOSIS — C3411 Malignant neoplasm of upper lobe, right bronchus or lung: Secondary | ICD-10-CM

## 2020-12-08 ENCOUNTER — Inpatient Hospital Stay (HOSPITAL_COMMUNITY): Payer: Medicare Other | Attending: Hematology

## 2020-12-08 ENCOUNTER — Encounter (HOSPITAL_COMMUNITY): Payer: Self-pay

## 2020-12-08 ENCOUNTER — Other Ambulatory Visit: Payer: Self-pay

## 2020-12-08 DIAGNOSIS — F419 Anxiety disorder, unspecified: Secondary | ICD-10-CM | POA: Insufficient documentation

## 2020-12-08 DIAGNOSIS — Z9221 Personal history of antineoplastic chemotherapy: Secondary | ICD-10-CM | POA: Insufficient documentation

## 2020-12-08 DIAGNOSIS — Z79899 Other long term (current) drug therapy: Secondary | ICD-10-CM | POA: Diagnosis not present

## 2020-12-08 DIAGNOSIS — Z87891 Personal history of nicotine dependence: Secondary | ICD-10-CM | POA: Diagnosis not present

## 2020-12-08 DIAGNOSIS — Z833 Family history of diabetes mellitus: Secondary | ICD-10-CM | POA: Insufficient documentation

## 2020-12-08 DIAGNOSIS — D509 Iron deficiency anemia, unspecified: Secondary | ICD-10-CM | POA: Diagnosis not present

## 2020-12-08 DIAGNOSIS — E559 Vitamin D deficiency, unspecified: Secondary | ICD-10-CM | POA: Diagnosis not present

## 2020-12-08 DIAGNOSIS — Z8249 Family history of ischemic heart disease and other diseases of the circulatory system: Secondary | ICD-10-CM | POA: Insufficient documentation

## 2020-12-08 DIAGNOSIS — C3411 Malignant neoplasm of upper lobe, right bronchus or lung: Secondary | ICD-10-CM

## 2020-12-08 DIAGNOSIS — J440 Chronic obstructive pulmonary disease with acute lower respiratory infection: Secondary | ICD-10-CM | POA: Diagnosis not present

## 2020-12-08 DIAGNOSIS — Z7982 Long term (current) use of aspirin: Secondary | ICD-10-CM | POA: Insufficient documentation

## 2020-12-08 DIAGNOSIS — K219 Gastro-esophageal reflux disease without esophagitis: Secondary | ICD-10-CM | POA: Insufficient documentation

## 2020-12-08 DIAGNOSIS — Z923 Personal history of irradiation: Secondary | ICD-10-CM | POA: Diagnosis not present

## 2020-12-08 DIAGNOSIS — I10 Essential (primary) hypertension: Secondary | ICD-10-CM | POA: Diagnosis not present

## 2020-12-08 DIAGNOSIS — Z801 Family history of malignant neoplasm of trachea, bronchus and lung: Secondary | ICD-10-CM | POA: Insufficient documentation

## 2020-12-08 LAB — COMPREHENSIVE METABOLIC PANEL
ALT: 21 U/L (ref 0–44)
AST: 19 U/L (ref 15–41)
Albumin: 3.9 g/dL (ref 3.5–5.0)
Alkaline Phosphatase: 76 U/L (ref 38–126)
Anion gap: 8 (ref 5–15)
BUN: 20 mg/dL (ref 8–23)
CO2: 25 mmol/L (ref 22–32)
Calcium: 8.4 mg/dL — ABNORMAL LOW (ref 8.9–10.3)
Chloride: 101 mmol/L (ref 98–111)
Creatinine, Ser: 0.8 mg/dL (ref 0.44–1.00)
GFR, Estimated: >60 mL/min (ref >60)
Glucose, Bld: 81 mg/dL (ref 70–99)
Potassium: 4 mmol/L (ref 3.5–5.1)
Sodium: 134 mmol/L — ABNORMAL LOW (ref 135–145)
Total Bilirubin: 0.7 mg/dL (ref 0.3–1.2)
Total Protein: 7 g/dL (ref 6.5–8.1)

## 2020-12-08 LAB — CBC WITH DIFFERENTIAL/PLATELET
Abs Immature Granulocytes: 0.04 10*3/uL (ref 0.00–0.07)
Basophils Absolute: 0.1 10*3/uL (ref 0.0–0.1)
Basophils Relative: 1 %
Eosinophils Absolute: 0.3 10*3/uL (ref 0.0–0.5)
Eosinophils Relative: 6 %
HCT: 38.2 % (ref 36.0–46.0)
Hemoglobin: 12.7 g/dL (ref 12.0–15.0)
Immature Granulocytes: 1 %
Lymphocytes Relative: 19 %
Lymphs Abs: 0.9 10*3/uL (ref 0.7–4.0)
MCH: 34.4 pg — ABNORMAL HIGH (ref 26.0–34.0)
MCHC: 33.2 g/dL (ref 30.0–36.0)
MCV: 103.5 fL — ABNORMAL HIGH (ref 80.0–100.0)
Monocytes Absolute: 0.4 10*3/uL (ref 0.1–1.0)
Monocytes Relative: 9 %
Neutro Abs: 3 10*3/uL (ref 1.7–7.7)
Neutrophils Relative %: 64 %
Platelets: 335 10*3/uL (ref 150–400)
RBC: 3.69 MIL/uL — ABNORMAL LOW (ref 3.87–5.11)
RDW: 12.2 % (ref 11.5–15.5)
WBC: 4.7 10*3/uL (ref 4.0–10.5)
nRBC: 0 % (ref 0.0–0.2)

## 2020-12-08 LAB — VITAMIN B12: Vitamin B-12: 4550 pg/mL — ABNORMAL HIGH (ref 180–914)

## 2020-12-08 LAB — IRON AND TIBC
Iron: 120 ug/dL (ref 28–170)
Saturation Ratios: 41 % — ABNORMAL HIGH (ref 10.4–31.8)
TIBC: 295 ug/dL (ref 250–450)
UIBC: 175 ug/dL

## 2020-12-08 LAB — LACTATE DEHYDROGENASE: LDH: 162 U/L (ref 98–192)

## 2020-12-08 LAB — FERRITIN: Ferritin: 97 ng/mL (ref 11–307)

## 2020-12-08 LAB — VITAMIN D 25 HYDROXY (VIT D DEFICIENCY, FRACTURES): Vit D, 25-Hydroxy: 113.35 ng/mL — ABNORMAL HIGH (ref 30–100)

## 2020-12-08 MED ORDER — SODIUM CHLORIDE 0.9% FLUSH
10.0000 mL | Freq: Once | INTRAVENOUS | Status: AC
  Administered 2020-12-08: 10 mL via INTRAVENOUS

## 2020-12-08 MED ORDER — HEPARIN SOD (PORK) LOCK FLUSH 100 UNIT/ML IV SOLN
500.0000 [IU] | Freq: Once | INTRAVENOUS | Status: AC
  Administered 2020-12-08: 500 [IU] via INTRAVENOUS

## 2020-12-08 NOTE — Progress Notes (Signed)
Patients port flushed without difficulty.  Good blood return noted with no bruising or swelling noted at site.  Band aid applied.  VSS with discharge and left in satisfactory condition with no s/s of distress noted.   

## 2020-12-11 ENCOUNTER — Other Ambulatory Visit: Payer: Self-pay

## 2020-12-11 ENCOUNTER — Ambulatory Visit (HOSPITAL_COMMUNITY)
Admission: RE | Admit: 2020-12-11 | Discharge: 2020-12-11 | Disposition: A | Discharge status: Home / Self Care | Payer: Medicare Other | Source: Ambulatory Visit | Attending: Nurse Practitioner | Admitting: Nurse Practitioner

## 2020-12-11 ENCOUNTER — Encounter (HOSPITAL_COMMUNITY): Payer: Self-pay | Admitting: Radiology

## 2020-12-11 DIAGNOSIS — C3411 Malignant neoplasm of upper lobe, right bronchus or lung: Secondary | ICD-10-CM | POA: Diagnosis not present

## 2020-12-11 DIAGNOSIS — I2510 Atherosclerotic heart disease of native coronary artery without angina pectoris: Secondary | ICD-10-CM | POA: Diagnosis not present

## 2020-12-11 DIAGNOSIS — J700 Acute pulmonary manifestations due to radiation: Secondary | ICD-10-CM | POA: Diagnosis not present

## 2020-12-11 DIAGNOSIS — J8410 Pulmonary fibrosis, unspecified: Secondary | ICD-10-CM | POA: Diagnosis not present

## 2020-12-11 MED ORDER — IOHEXOL 300 MG/ML  SOLN
75.0000 mL | Freq: Once | INTRAMUSCULAR | Status: AC | PRN
  Administered 2020-12-11: 75 mL via INTRAVENOUS

## 2020-12-15 NOTE — Progress Notes (Signed)
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  S/P concomitant chemoradiation with carboplatin/paclitaxel (09/20/2016- 10/25/2016) followed by 2 cycle of consolidative chemotherapy with carboplatin/paclitaxel (11/15/2016- 12/06/2016).  Unable to tolerate consolidative durvalumab (Imfinzi) beginning on 01/26/2016 and stopped 05/2017.   AND Imfinzi-induced pneumonitis, grade 2, resulting in holding Imfinzi from 04/25/2017- 05/09/2017 with corticosteroids treatment with taper.  This resolved/improved with steroids. Developed pnemonitis again after rechallenge with imfinzi. On steroid taper again.     Primary cancer of right upper lobe of lung ([place])  08/04/2016 PET scan   PET IMPRESSION: 1. 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 Mass is confluent with the right superior hilum and right lower tracheal wall, suggestive of a T4 primary tumor. 2. Postobstructive pneumonia in the right upper lobe. 3. Hypermetabolic ipsilateral and contralateral mediastinal and contralateral hilar lymphadenopathy, suggestive of N3 nodal disease.   08/24/2016 Procedure   Bronchoscopy   08/25/2016 Pathology Results   Diagnosis Endobronchial biopsy, RUL - POORLY DIFFERENTIATED NON-SMALL CELL CARCINOMA. The immunophenotype is consistent with poorly differentiated adenocarcinoma.   09/10/2016 Imaging   MRI brain- No metastatic disease or acute intracranial abnormality.   09/20/2016 - 10/25/2016  Chemotherapy   Carboplatin/Paclitaxel weekly x 6 with XRT.   09/20/2016 - 10/25/2016 Radiation Therapy     11/15/2016 - 12/06/2016 Chemotherapy   Carboplatin/paclitaxel every 21 days x 2 cycles.   12/30/2016 Imaging   CT CAP- 1. Central right upper lobe lung mass has mildly decreased since 11/21/2016 chest CT angiogram and significantly decreased since 08/04/2016 PET-CT. 2. No pathologically enlarged thoracic lymph nodes. Previously described hypermetabolic thoracic adenopathy on the 08/04/2016 PET-CT has decreased in size. 3. Previously described bilateral lower lobe pulmonary nodules are stable in size. 4. New solitary subsolid 4 mm right middle lobe pulmonary nodule is nonspecific, and may be inflammatory. Recommend attention on a follow-up chest CT in 3 months. 5. No definite metastatic disease in the abdomen. Tiny sub 5 mm low-attenuation lesions in the liver are too small to characterize, favor benign. 6. Aortic atherosclerosis. One vessel coronary atherosclerosis. 7. Moderate emphysema.   01/25/2017 -  Chemotherapy   Durvalumab (Imfinzi) x 52 weeks.    03/31/2017 Imaging   CT chest with contrast: Stable size of partially calcified mass in the posterior right upper lobe.  Increased airspace disease in paramediastinal right upper and superior right lower lobes. Differential diagnosis includes radiation pneumonitis, drug reaction, and infection.  Stable sub-cm right lower lobe pulmonary nodule.  No evidence of lymphadenopathy or pleural effusion.  Emphysema.  Aortic and coronary artery atherosclerosis.   04/25/2017 Adverse Reaction   Progressive cough, concerning for pneumonitis.   04/25/2017 Treatment Plan Change   Progressive cough concerning for pneumonitis.  Treatment held.  Managed by steroids.   05/09/2017 Treatment Plan Change   Restart Imfinzi with improvement of cough, Grade 1.   05/27/2017 Imaging   CT CHEST WITH CONTRAST IMPRESSION: 1. Similar size  of partially calcified posterior right apical lung mass. 2. Similar to minimal increase in surrounding radiation fibrosis. 3. Subtle  areas of mosaic attenuation are greater on the right. Likely attributed to mild ground-glass opacity. In the appropriate clinical setting, this could represent drug toxicity induced pneumonitis. 4.  Coronary artery atherosclerosis. Aortic atherosclerosis. 5. Similar right lower lobe pulmonary nodule.   05/27/2017 Adverse Reaction   Developed worsening cough and SOB again after imfinzi. Grade 2. Placed on long steroid taper.  Permanently discontinue any further imfinzi treatments.   07/29/2017 Imaging   CT chest/abd/pelvis: IMPRESSION: 1. Stable post treatment related changes of mass-like radiation fibrosis in the upper right lung with similar appearance of densely calcified mass in the apex of the right upper lobe. No finding to suggest local recurrence of disease or metastatic disease in the chest, abdomen or pelvis on today's examination. 2. 7 mm subpleural nodule in the periphery of the right lower lobe is stable and favored to be benign. Continued attention on followup studies is recommended to ensure stability. 3. Aortic atherosclerosis, in addition to left anterior descending coronary artery disease. Please note that although the presence of coronary artery calcium documents the presence of coronary artery disease, the severity of this disease and any potential stenosis cannot be assessed on this non-gated CT examination. Assessment for potential risk factor modification, dietary therapy or pharmacologic therapy may be warranted, if clinically indicated. 4. There are calcifications of the mitral valve. Echocardiographic correlation for evaluation of potential valvular dysfunction may be warranted if clinically indicated. 5. Mild diffuse bronchial wall thickening with mild to moderate centrilobular and mild paraseptal emphysema; imaging  findings suggestive of underlying COPD. 6. Tip of left subclavian Port-A-Cath is now misdirected into the subclavian vein.     CANCER STAGING: Cancer Staging Primary cancer of right upper lobe of lung ([place]) Staging form: Lung, AJCC 7th Edition - Clinical stage from 09/27/2016: Stage IIIB (T4, N3, M0) - Signed by Baird Cancer, PA-C on 09/27/2016    INTERVAL HISTORY:   Emily Pope 64 y.o. female returns for routine follow-up for lung cancer.   She complains of right rib pain, started a few months ago. Pain is 5/10 in intensity,  She doesn't take any pain medication. She has SOB at baseline, which may not have worsened No weight loss reported. NO change in bowel habits NO change in urinaryhaits No headaches, falls, seizures  REVIEW OF SYSTEMS:  Review of Systems  HENT:   Negative for trouble swallowing.   Respiratory: Positive for cough, shortness of breath and wheezing.   Neurological: Positive for numbness.  Psychiatric/Behavioral: Positive for sleep disturbance.  All other systems reviewed and are negative.    PAST MEDICAL/SURGICAL HISTORY:  Past Medical History:  Diagnosis Date  . Anxiety   . Arthritis   . Chronic bronchitis ([place])   . COPD (chronic obstructive pulmonary disease) ([place])   . Essential hypertension   . GERD (gastroesophageal reflux disease)   . Headache   . History of pneumonia   . Pneumonitis   . Primary cancer of right upper lobe of lung ([place])    Stage IIIb adenocarcinoma   Past Surgical History:  Procedure Laterality Date  . ABDOMINAL HYSTERECTOMY    . APPENDECTOMY     when had hysterectomy  . CARPAL TUNNEL RELEASE Bilateral   . ENDOBRONCHIAL ULTRASOUND Bilateral 08/23/2016   Procedure: ENDOBRONCHIAL ULTRASOUND;  Surgeon: Collene Gobble, MD;  Location: [facility];  Service: Cardiopulmonary;  Laterality: Bilateral;  . HERNIA REPAIR     left inguinal hernia age 54  . PORTACATH PLACEMENT Left 09/17/2016   Procedure: INSERTION  PORT-A-CATH  LEFT SUBCLAVIAN;  Surgeon: Aviva Signs, MD;  Location: [facility];  Service: General;  Laterality: Left;  . TUBAL LIGATION       SOCIAL HISTORY:  Social History   Socioeconomic History  . Marital status: Divorced    Spouse name: Not on file  . Number of children: Not on file  . Years of education: Not on file  . Highest education level: Not on file  Occupational History  . Not on file  Tobacco Use  . Smoking status: Former Smoker    Packs/day: 2.00    Years: 43.00    Pack years: 86.00    Quit date: 07/20/2016    Years since quitting: 4.4  . Smokeless tobacco: Never Used  Vaping Use  . Vaping Use: Never used  Substance and Sexual Activity  . Alcohol use: No  . Drug use: No  . Sexual activity: Not on file  Other Topics Concern  . Not on file  Social History Narrative  . Not on file   Social Determinants of Health   Financial Resource Strain: Not on file  Food Insecurity: Not on file  Transportation Needs: Not on file  Physical Activity: Not on file  Stress: Not on file  Social Connections: Not on file  Intimate Partner Violence: Not on file    FAMILY HISTORY:  Family History  Problem Relation Age of Onset  . Hypertension Mother   . Lung cancer Mother   . Hypertension Father   . Diabetes Father   . Hypertension Sister   . Lung cancer Brother   . Hypertension Sister   . Colon cancer Neg Hx     CURRENT MEDICATIONS:  Outpatient Encounter Medications as of 12/16/2020  Medication Sig  . aspirin EC 81 MG tablet Take 81 mg by mouth daily.  Marland Kitchen buPROPion (WELLBUTRIN XL) 150 MG 24 hr tablet Take 150 mg by mouth 2 (two) times daily.  Marland Kitchen docusate sodium (COLACE) 100 MG capsule Take 100 mg by mouth 2 (two) times daily.  . ferrous sulfate 325 (65 FE) MG tablet Take 325 mg by mouth daily with breakfast.  . folic acid (FOLVITE) 1 MG tablet Take 1 tablet by mouth once daily  . hydrochlorothiazide (HYDRODIURIL) 12.5 MG tablet Take 12.5 mg by mouth daily.   Marland Kitchen levocetirizine  (XYZAL) 5 MG tablet Take 5 mg by mouth daily.  Marland Kitchen lidocaine-prilocaine (EMLA) cream Apply 1 application topically as needed.  . loratadine (CLARITIN) 10 MG tablet Take 10 mg by mouth daily as needed.   Marland Kitchen losartan (COZAAR) 50 MG tablet Take 50 mg by mouth daily.   . Melatonin 10 MG TABS Take 1 tablet by mouth at bedtime.  . traMADol (ULTRAM) 50 MG tablet Take 1 tablet (50 mg total) by mouth every 6 (six) hours as needed. for pain  . TRELEGY ELLIPTA 100-62.5-25 MCG/INH AEPB Take 1 puff by mouth daily.   . vitamin B-12 (CYANOCOBALAMIN) 1000 MCG tablet Take 2,000 mcg by mouth daily.   . Vitamin D, Ergocalciferol, (DRISDOL) 1.25 MG (50000 UNIT) CAPS capsule Take 50,000 Units by mouth once a week.  Marland Kitchen albuterol (PROVENTIL HFA;VENTOLIN HFA) 108 (90 Base) MCG/ACT inhaler Inhale 2 puffs into the lungs every 6 (six) hours as needed for wheezing or shortness of breath. (Patient not taking: Reported on 12/16/2020)  . [DISCONTINUED] folic acid (FOLVITE) 1 MG tablet Take 1 tablet by mouth once daily  . [DISCONTINUED] gabapentin (NEURONTIN) 100 MG capsule Take 1 capsule (100 mg total) by  mouth 3 (three) times daily.   No facility-administered encounter medications on file as of 12/16/2020.    ALLERGIES:  Allergies  Allergen Reactions  . Clindamycin/Lincomycin Rash  . Codeine Anaphylaxis  . Lincomycin Rash    Also Clindamycin as it has lincomycin in it Also Clindamycin as it has lincomycin in it  . Other Rash  . Penicillins Anaphylaxis    Has patient had a PCN reaction causing immediate rash, facial/tongue/throat swelling, SOB or lightheadedness with hypotension: Unknown Has patient had a PCN reaction causing severe rash involving mucus membranes or skin necrosis: Unknown Has patient had a PCN reaction that required hospitalization: No Has patient had a PCN reaction occurring within the last 10 years: No If all of the above answers are "NO", then may proceed with Cephalosporin use. Has patient had a PCN  reaction causing immediate rash, facial/tongue/throat swelling, SOB or lightheadedness with hypotension: Unknown Has patient had a PCN reaction causing severe rash involving mucus membranes or skin necrosis: Unknown Has patient had a PCN reaction that required hospitalization: No Has patient had a PCN reaction occurring within the last 10 years: No If all of the above answers are "NO", then may proceed with Cephalosporin use.   . Vancomycin Itching    Scalp itching  . Hydrocodone     RASH  . Sulfa Antibiotics Nausea Only     PHYSICAL EXAM:  ECOG Performance status: 1  Vitals:   12/16/20 1014  BP: 134/82  Pulse: 85  Resp: 18  Temp: (!) 97 F (36.1 C)  SpO2: 100%   Filed Weights   12/16/20 1014  Weight: 128 lb 12 oz (58.4 kg)      Physical Exam Constitutional:      Appearance: Normal appearance. She is normal weight.  Cardiovascular:     Rate and Rhythm: Normal rate and regular rhythm.     Heart sounds: Normal heart sounds.  Pulmonary:     Effort: Pulmonary effort is normal.     Breath sounds: Wheezing present.     Comments: Scattered bronchial breathing Abdominal:     General: Bowel sounds are normal.     Palpations: Abdomen is soft.  Musculoskeletal:        General: Normal range of motion.  Skin:    General: Skin is warm.  Neurological:     Mental Status: She is alert and oriented to person, place, and time. Mental status is at baseline.  Psychiatric:        Mood and Affect: Mood normal.        Behavior: Behavior normal.        Thought Content: Thought content normal.        Judgment: Judgment normal.      LABORATORY DATA:  I have reviewed the labs as listed.  CBC    Component Value Date/Time   WBC 4.7 12/08/2020 1029   RBC 3.69 (L) 12/08/2020 1029   HGB 12.7 12/08/2020 1029   HGB 12.9 09/02/2016 1403   HCT 38.2 12/08/2020 1029   HCT 38.4 09/02/2016 1403   PLT 335 12/08/2020 1029   PLT 476 (H) 09/02/2016 1403   MCV 103.5 (H) 12/08/2020 1029    MCV 94.6 09/02/2016 1403   MCH 34.4 (H) 12/08/2020 1029   MCHC 33.2 12/08/2020 1029   RDW 12.2 12/08/2020 1029   RDW 13.6 09/02/2016 1403   LYMPHSABS 0.9 12/08/2020 1029   LYMPHSABS 2.5 09/02/2016 1403   MONOABS 0.4 12/08/2020 1029   MONOABS 0.7 09/02/2016 1403  EOSABS 0.3 12/08/2020 1029   EOSABS 0.7 (H) 09/02/2016 1403   BASOSABS 0.1 12/08/2020 1029   BASOSABS 0.1 09/02/2016 1403   CMP Latest Ref Rng & Units 12/08/2020 06/09/2020 12/11/2019  Glucose 70 - 99 mg/dL 81 88 89  BUN 8 - 23 mg/dL 20 22 16   Creatinine 0.44 - 1.00 mg/dL 0.80 1.00 0.89  Sodium 135 - 145 mmol/L 134(L) 135 139  Potassium 3.5 - 5.1 mmol/L 4.0 4.1 4.4  Chloride 98 - 111 mmol/L 101 100 102  CO2 22 - 32 mmol/L 25 25 29   Calcium 8.9 - 10.3 mg/dL 8.4(L) 9.0 9.2  Total Protein 6.5 - 8.1 g/dL 7.0 7.2 7.4  Total Bilirubin 0.3 - 1.2 mg/dL 0.7 0.3 0.7  Alkaline Phos 38 - 126 U/L 76 87 89  AST 15 - 41 U/L 19 19 15   ALT 0 - 44 U/L 21 21 11     DIAGNOSTIC IMAGING:  I have independently reviewed the CT scans and discussed with the patient.  ASSESSMENT & PLAN:   No problem-specific Assessment & Plan notes found for this encounter.   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 -Durvalumab consolidation from 01/25/2017, discontinued on 05/09/2017 secondary to pneumonitis. -PET scan on 11/20/2018 showed further reduction in activity associated with the right apical consolidation, likely treatment related, without current evidence of malignancy.  Chronically stable 7 mm nodule in the right lateral costophrenic angle without current hypermetabolic activity. -CT of the chest on 06/01/2019 which showed treatment effect with the right upper lobe with no evidence of local  recurrence or residual disease. -CT chest 12/11/2019 showed no changes in right apical consolidation.  A new 3 mm left upper lobe nodule is new since the prior exam.  If new left upper lobe nodule has increased in size this would warrant a PET CT followed by possible biopsy if FDG avid. -CT of the chest done on 06/09/2020 showed stable appearance of posttreatment changes involving the right upper lobe with compensatory hyperinflation of the right middle lobe and right lower lobe.  Stable subpleural nodules in the right lower lobe and 3 mm left upper lobe lung nodule.  Previously noted 5 mm  peribronchovascular nodule now measures 3 mm and is likely postinflammatory. -She is here for FU, repeat imaging with small new lung nodules, short term FU recommended, repeat CT scan ordered. - Discussed that biopsy is likely not going to yield much given the small size and PET is not sensitive if tumors smaller than 1 cm - She is agreeable to repeat imaging in 3 months.  2.  COPD/pneumonitis: -She is using ventolin and trelegy.  She is also taking Singulair. -Stable since last visit.  3.  Vitamin D deficiency: - Resolved, todays level is 115. Encouraged to stop supplementation of both Vit D and B12  4.  Iron deficiency anemia: -She is taking oral iron tablets daily. -Resolved, ferritin of 97 today, ok to continue for another 3 months.   Orders placed this encounter:  Orders Placed This Encounter  Procedures  . CT Chest W Contrast    I spent 30 minutes in the care of this patient including H and P, review of records, counseling and coordination of care. Benay Pike MD

## 2020-12-16 ENCOUNTER — Other Ambulatory Visit (HOSPITAL_COMMUNITY): Payer: Self-pay | Admitting: *Deleted

## 2020-12-16 ENCOUNTER — Other Ambulatory Visit: Payer: Self-pay

## 2020-12-16 ENCOUNTER — Inpatient Hospital Stay (HOSPITAL_BASED_OUTPATIENT_CLINIC_OR_DEPARTMENT_OTHER): Payer: Medicare Other | Admitting: Hematology and Oncology

## 2020-12-16 ENCOUNTER — Encounter (HOSPITAL_COMMUNITY): Payer: Self-pay | Admitting: Hematology and Oncology

## 2020-12-16 VITALS — BP 134/82 | HR 85 | Temp 97.0°F | Resp 18 | Wt 128.7 lb

## 2020-12-16 DIAGNOSIS — J440 Chronic obstructive pulmonary disease with acute lower respiratory infection: Secondary | ICD-10-CM | POA: Diagnosis not present

## 2020-12-16 DIAGNOSIS — C3411 Malignant neoplasm of upper lobe, right bronchus or lung: Secondary | ICD-10-CM

## 2020-12-16 DIAGNOSIS — D509 Iron deficiency anemia, unspecified: Secondary | ICD-10-CM | POA: Diagnosis not present

## 2020-12-16 DIAGNOSIS — Z801 Family history of malignant neoplasm of trachea, bronchus and lung: Secondary | ICD-10-CM | POA: Diagnosis not present

## 2020-12-16 DIAGNOSIS — Z923 Personal history of irradiation: Secondary | ICD-10-CM | POA: Diagnosis not present

## 2020-12-16 DIAGNOSIS — E559 Vitamin D deficiency, unspecified: Secondary | ICD-10-CM | POA: Diagnosis not present

## 2020-12-16 DIAGNOSIS — Z79899 Other long term (current) drug therapy: Secondary | ICD-10-CM | POA: Diagnosis not present

## 2020-12-16 DIAGNOSIS — I10 Essential (primary) hypertension: Secondary | ICD-10-CM | POA: Diagnosis not present

## 2020-12-16 DIAGNOSIS — Z7982 Long term (current) use of aspirin: Secondary | ICD-10-CM | POA: Diagnosis not present

## 2020-12-16 DIAGNOSIS — Z833 Family history of diabetes mellitus: Secondary | ICD-10-CM | POA: Diagnosis not present

## 2020-12-16 DIAGNOSIS — Z9221 Personal history of antineoplastic chemotherapy: Secondary | ICD-10-CM | POA: Diagnosis not present

## 2020-12-16 DIAGNOSIS — K219 Gastro-esophageal reflux disease without esophagitis: Secondary | ICD-10-CM | POA: Diagnosis not present

## 2020-12-16 DIAGNOSIS — Z87891 Personal history of nicotine dependence: Secondary | ICD-10-CM | POA: Diagnosis not present

## 2020-12-16 DIAGNOSIS — Z8249 Family history of ischemic heart disease and other diseases of the circulatory system: Secondary | ICD-10-CM | POA: Diagnosis not present

## 2020-12-17 ENCOUNTER — Other Ambulatory Visit (HOSPITAL_COMMUNITY): Payer: Self-pay | Admitting: *Deleted

## 2020-12-17 DIAGNOSIS — C3411 Malignant neoplasm of upper lobe, right bronchus or lung: Secondary | ICD-10-CM

## 2020-12-17 MED ORDER — TRAMADOL HCL 50 MG PO TABS: 50.0000 mg | ORAL_TABLET | Freq: Four times a day (QID) | ORAL | 0 refills | Status: DC | PRN

## 2020-12-26 DIAGNOSIS — I10 Essential (primary) hypertension: Secondary | ICD-10-CM | POA: Diagnosis not present

## 2021-01-01 DIAGNOSIS — Z87891 Personal history of nicotine dependence: Secondary | ICD-10-CM | POA: Diagnosis not present

## 2021-01-01 DIAGNOSIS — C3490 Malignant neoplasm of unspecified part of unspecified bronchus or lung: Secondary | ICD-10-CM | POA: Diagnosis not present

## 2021-01-01 DIAGNOSIS — I10 Essential (primary) hypertension: Secondary | ICD-10-CM | POA: Diagnosis not present

## 2021-01-01 DIAGNOSIS — Z299 Encounter for prophylactic measures, unspecified: Secondary | ICD-10-CM | POA: Diagnosis not present

## 2021-01-01 DIAGNOSIS — J449 Chronic obstructive pulmonary disease, unspecified: Secondary | ICD-10-CM | POA: Diagnosis not present

## 2021-01-01 DIAGNOSIS — I700 Atherosclerosis of aorta: Secondary | ICD-10-CM | POA: Diagnosis not present

## 2021-01-22 DIAGNOSIS — Z923 Personal history of irradiation: Secondary | ICD-10-CM | POA: Diagnosis not present

## 2021-01-22 DIAGNOSIS — Z7982 Long term (current) use of aspirin: Secondary | ICD-10-CM | POA: Diagnosis not present

## 2021-01-22 DIAGNOSIS — J9601 Acute respiratory failure with hypoxia: Secondary | ICD-10-CM | POA: Diagnosis not present

## 2021-01-22 DIAGNOSIS — M545 Low back pain, unspecified: Secondary | ICD-10-CM | POA: Diagnosis not present

## 2021-01-22 DIAGNOSIS — B370 Candidal stomatitis: Secondary | ICD-10-CM | POA: Diagnosis not present

## 2021-01-22 DIAGNOSIS — Z95828 Presence of other vascular implants and grafts: Secondary | ICD-10-CM | POA: Diagnosis not present

## 2021-01-22 DIAGNOSIS — J45909 Unspecified asthma, uncomplicated: Secondary | ICD-10-CM | POA: Diagnosis not present

## 2021-01-22 DIAGNOSIS — J439 Emphysema, unspecified: Secondary | ICD-10-CM | POA: Diagnosis not present

## 2021-01-22 DIAGNOSIS — E860 Dehydration: Secondary | ICD-10-CM | POA: Diagnosis not present

## 2021-01-22 DIAGNOSIS — R0602 Shortness of breath: Secondary | ICD-10-CM | POA: Diagnosis not present

## 2021-01-22 DIAGNOSIS — C3490 Malignant neoplasm of unspecified part of unspecified bronchus or lung: Secondary | ICD-10-CM | POA: Diagnosis not present

## 2021-01-22 DIAGNOSIS — J1282 Pneumonia due to coronavirus disease 2019: Secondary | ICD-10-CM | POA: Diagnosis not present

## 2021-01-22 DIAGNOSIS — C3411 Malignant neoplasm of upper lobe, right bronchus or lung: Secondary | ICD-10-CM | POA: Diagnosis not present

## 2021-01-22 DIAGNOSIS — U071 COVID-19: Secondary | ICD-10-CM | POA: Diagnosis not present

## 2021-01-22 DIAGNOSIS — M549 Dorsalgia, unspecified: Secondary | ICD-10-CM | POA: Diagnosis not present

## 2021-01-22 DIAGNOSIS — Z87891 Personal history of nicotine dependence: Secondary | ICD-10-CM | POA: Diagnosis not present

## 2021-01-22 DIAGNOSIS — I700 Atherosclerosis of aorta: Secondary | ICD-10-CM | POA: Insufficient documentation

## 2021-01-22 DIAGNOSIS — F32A Depression, unspecified: Secondary | ICD-10-CM | POA: Diagnosis not present

## 2021-01-22 DIAGNOSIS — R0902 Hypoxemia: Secondary | ICD-10-CM | POA: Diagnosis not present

## 2021-01-22 DIAGNOSIS — Z20822 Contact with and (suspected) exposure to covid-19: Secondary | ICD-10-CM | POA: Diagnosis not present

## 2021-01-22 DIAGNOSIS — R059 Cough, unspecified: Secondary | ICD-10-CM | POA: Diagnosis not present

## 2021-01-22 DIAGNOSIS — J189 Pneumonia, unspecified organism: Secondary | ICD-10-CM | POA: Insufficient documentation

## 2021-01-22 DIAGNOSIS — J441 Chronic obstructive pulmonary disease with (acute) exacerbation: Secondary | ICD-10-CM | POA: Diagnosis not present

## 2021-01-22 DIAGNOSIS — R0600 Dyspnea, unspecified: Secondary | ICD-10-CM | POA: Diagnosis not present

## 2021-01-22 DIAGNOSIS — I10 Essential (primary) hypertension: Secondary | ICD-10-CM | POA: Insufficient documentation

## 2021-01-22 DIAGNOSIS — R911 Solitary pulmonary nodule: Secondary | ICD-10-CM | POA: Diagnosis not present

## 2021-01-22 DIAGNOSIS — J040 Acute laryngitis: Secondary | ICD-10-CM | POA: Diagnosis not present

## 2021-01-22 DIAGNOSIS — G8929 Other chronic pain: Secondary | ICD-10-CM | POA: Diagnosis not present

## 2021-01-26 DIAGNOSIS — I10 Essential (primary) hypertension: Secondary | ICD-10-CM | POA: Diagnosis not present

## 2021-01-29 DIAGNOSIS — B370 Candidal stomatitis: Secondary | ICD-10-CM | POA: Insufficient documentation

## 2021-02-05 DIAGNOSIS — I700 Atherosclerosis of aorta: Secondary | ICD-10-CM | POA: Diagnosis not present

## 2021-02-05 DIAGNOSIS — C3490 Malignant neoplasm of unspecified part of unspecified bronchus or lung: Secondary | ICD-10-CM | POA: Diagnosis not present

## 2021-02-05 DIAGNOSIS — Z299 Encounter for prophylactic measures, unspecified: Secondary | ICD-10-CM | POA: Diagnosis not present

## 2021-02-05 DIAGNOSIS — I10 Essential (primary) hypertension: Secondary | ICD-10-CM | POA: Diagnosis not present

## 2021-02-23 DIAGNOSIS — I10 Essential (primary) hypertension: Secondary | ICD-10-CM | POA: Diagnosis not present

## 2021-02-23 DIAGNOSIS — C3490 Malignant neoplasm of unspecified part of unspecified bronchus or lung: Secondary | ICD-10-CM | POA: Diagnosis not present

## 2021-02-23 DIAGNOSIS — R059 Cough, unspecified: Secondary | ICD-10-CM | POA: Diagnosis not present

## 2021-02-23 DIAGNOSIS — Z299 Encounter for prophylactic measures, unspecified: Secondary | ICD-10-CM | POA: Diagnosis not present

## 2021-03-13 ENCOUNTER — Inpatient Hospital Stay (HOSPITAL_COMMUNITY): Payer: Medicare Other | Attending: Hematology and Oncology

## 2021-03-13 ENCOUNTER — Other Ambulatory Visit: Payer: Self-pay

## 2021-03-13 ENCOUNTER — Other Ambulatory Visit (HOSPITAL_COMMUNITY): Payer: Self-pay

## 2021-03-13 DIAGNOSIS — Z79899 Other long term (current) drug therapy: Secondary | ICD-10-CM | POA: Diagnosis not present

## 2021-03-13 DIAGNOSIS — Z923 Personal history of irradiation: Secondary | ICD-10-CM | POA: Insufficient documentation

## 2021-03-13 DIAGNOSIS — E559 Vitamin D deficiency, unspecified: Secondary | ICD-10-CM | POA: Diagnosis not present

## 2021-03-13 DIAGNOSIS — C3411 Malignant neoplasm of upper lobe, right bronchus or lung: Secondary | ICD-10-CM | POA: Diagnosis not present

## 2021-03-13 DIAGNOSIS — Z833 Family history of diabetes mellitus: Secondary | ICD-10-CM | POA: Insufficient documentation

## 2021-03-13 DIAGNOSIS — Z9221 Personal history of antineoplastic chemotherapy: Secondary | ICD-10-CM | POA: Diagnosis not present

## 2021-03-13 DIAGNOSIS — Z8249 Family history of ischemic heart disease and other diseases of the circulatory system: Secondary | ICD-10-CM | POA: Insufficient documentation

## 2021-03-13 DIAGNOSIS — Z452 Encounter for adjustment and management of vascular access device: Secondary | ICD-10-CM | POA: Insufficient documentation

## 2021-03-13 DIAGNOSIS — Z90710 Acquired absence of both cervix and uterus: Secondary | ICD-10-CM | POA: Diagnosis not present

## 2021-03-13 DIAGNOSIS — Z9049 Acquired absence of other specified parts of digestive tract: Secondary | ICD-10-CM | POA: Insufficient documentation

## 2021-03-13 DIAGNOSIS — Z7982 Long term (current) use of aspirin: Secondary | ICD-10-CM | POA: Diagnosis not present

## 2021-03-13 DIAGNOSIS — Z87891 Personal history of nicotine dependence: Secondary | ICD-10-CM | POA: Diagnosis not present

## 2021-03-13 DIAGNOSIS — Z801 Family history of malignant neoplasm of trachea, bronchus and lung: Secondary | ICD-10-CM | POA: Diagnosis not present

## 2021-03-13 DIAGNOSIS — I10 Essential (primary) hypertension: Secondary | ICD-10-CM | POA: Diagnosis not present

## 2021-03-13 DIAGNOSIS — R7989 Other specified abnormal findings of blood chemistry: Secondary | ICD-10-CM

## 2021-03-13 DIAGNOSIS — D509 Iron deficiency anemia, unspecified: Secondary | ICD-10-CM | POA: Diagnosis not present

## 2021-03-13 LAB — LACTATE DEHYDROGENASE: LDH: 135 U/L (ref 98–192)

## 2021-03-13 LAB — CBC WITH DIFFERENTIAL/PLATELET
Abs Immature Granulocytes: 0.02 10*3/uL (ref 0.00–0.07)
Basophils Absolute: 0.1 10*3/uL (ref 0.0–0.1)
Basophils Relative: 1 %
Eosinophils Absolute: 0.3 10*3/uL (ref 0.0–0.5)
Eosinophils Relative: 5 %
HCT: 38.1 % (ref 36.0–46.0)
Hemoglobin: 12.6 g/dL (ref 12.0–15.0)
Immature Granulocytes: 0 %
Lymphocytes Relative: 17 %
Lymphs Abs: 1 10*3/uL (ref 0.7–4.0)
MCH: 33.2 pg (ref 26.0–34.0)
MCHC: 33.1 g/dL (ref 30.0–36.0)
MCV: 100.5 fL — ABNORMAL HIGH (ref 80.0–100.0)
Monocytes Absolute: 0.4 10*3/uL (ref 0.1–1.0)
Monocytes Relative: 8 %
Neutro Abs: 4.1 10*3/uL (ref 1.7–7.7)
Neutrophils Relative %: 69 %
Platelets: 312 10*3/uL (ref 150–400)
RBC: 3.79 MIL/uL — ABNORMAL LOW (ref 3.87–5.11)
RDW: 12.4 % (ref 11.5–15.5)
WBC: 5.8 10*3/uL (ref 4.0–10.5)
nRBC: 0 % (ref 0.0–0.2)

## 2021-03-13 LAB — COMPREHENSIVE METABOLIC PANEL
ALT: 17 U/L (ref 0–44)
AST: 17 U/L (ref 15–41)
Albumin: 3.7 g/dL (ref 3.5–5.0)
Alkaline Phosphatase: 87 U/L (ref 38–126)
Anion gap: 9 (ref 5–15)
BUN: 14 mg/dL (ref 8–23)
CO2: 23 mmol/L (ref 22–32)
Calcium: 8.8 mg/dL — ABNORMAL LOW (ref 8.9–10.3)
Chloride: 104 mmol/L (ref 98–111)
Creatinine, Ser: 0.83 mg/dL (ref 0.44–1.00)
GFR, Estimated: >60 mL/min (ref >60)
Glucose, Bld: 100 mg/dL — ABNORMAL HIGH (ref 70–99)
Potassium: 3.8 mmol/L (ref 3.5–5.1)
Sodium: 136 mmol/L (ref 135–145)
Total Bilirubin: 0.6 mg/dL (ref 0.3–1.2)
Total Protein: 7 g/dL (ref 6.5–8.1)

## 2021-03-13 LAB — VITAMIN D 25 HYDROXY (VIT D DEFICIENCY, FRACTURES): Vit D, 25-Hydroxy: 30.52 ng/mL (ref 30–100)

## 2021-03-13 LAB — VITAMIN B12: Vitamin B-12: 403 pg/mL (ref 180–914)

## 2021-03-13 LAB — IRON AND TIBC
Iron: 84 ug/dL (ref 28–170)
Saturation Ratios: 27 % (ref 10.4–31.8)
TIBC: 311 ug/dL (ref 250–450)
UIBC: 227 ug/dL

## 2021-03-13 LAB — FERRITIN: Ferritin: 68 ng/mL (ref 11–307)

## 2021-03-13 MED ORDER — HEPARIN SOD (PORK) LOCK FLUSH 100 UNIT/ML IV SOLN
500.0000 [IU] | Freq: Once | INTRAVENOUS | Status: AC
  Administered 2021-03-13: 500 [IU] via INTRAVENOUS

## 2021-03-13 MED ORDER — SODIUM CHLORIDE 0.9% FLUSH
10.0000 mL | Freq: Once | INTRAVENOUS | Status: AC
  Administered 2021-03-13: 10 mL via INTRAVENOUS

## 2021-03-13 NOTE — Progress Notes (Signed)
Patients port flushed without difficulty. No blood return noted from her port.  No bruising or swelling at the port site.  Labs were drawn peripherally.  Band aid applied.  VSS with discharge and left in satisfactory condition with no s/s of distress noted.

## 2021-03-16 ENCOUNTER — Other Ambulatory Visit: Payer: Self-pay

## 2021-03-16 ENCOUNTER — Ambulatory Visit (HOSPITAL_COMMUNITY)
Admission: RE | Admit: 2021-03-16 | Discharge: 2021-03-16 | Disposition: A | Discharge status: Home / Self Care | Payer: Medicare Other | Source: Ambulatory Visit | Attending: Hematology and Oncology | Admitting: Hematology and Oncology

## 2021-03-16 ENCOUNTER — Other Ambulatory Visit (HOSPITAL_COMMUNITY): Payer: Medicare Other

## 2021-03-16 DIAGNOSIS — C3411 Malignant neoplasm of upper lobe, right bronchus or lung: Secondary | ICD-10-CM | POA: Insufficient documentation

## 2021-03-16 DIAGNOSIS — J432 Centrilobular emphysema: Secondary | ICD-10-CM | POA: Diagnosis not present

## 2021-03-16 DIAGNOSIS — I2510 Atherosclerotic heart disease of native coronary artery without angina pectoris: Secondary | ICD-10-CM | POA: Diagnosis not present

## 2021-03-16 DIAGNOSIS — J8410 Pulmonary fibrosis, unspecified: Secondary | ICD-10-CM | POA: Diagnosis not present

## 2021-03-16 MED ORDER — IOHEXOL 300 MG/ML  SOLN
75.0000 mL | Freq: Once | INTRAMUSCULAR | Status: AC | PRN
  Administered 2021-03-16: 75 mL via INTRAVENOUS

## 2021-03-23 ENCOUNTER — Inpatient Hospital Stay (HOSPITAL_BASED_OUTPATIENT_CLINIC_OR_DEPARTMENT_OTHER): Payer: Medicare Other | Admitting: Hematology

## 2021-03-23 ENCOUNTER — Other Ambulatory Visit: Payer: Self-pay

## 2021-03-23 VITALS — BP 135/84 | HR 88 | Temp 97.0°F | Resp 22 | Wt 122.8 lb

## 2021-03-23 DIAGNOSIS — C3411 Malignant neoplasm of upper lobe, right bronchus or lung: Secondary | ICD-10-CM

## 2021-03-23 DIAGNOSIS — Z87891 Personal history of nicotine dependence: Secondary | ICD-10-CM | POA: Diagnosis not present

## 2021-03-23 DIAGNOSIS — D509 Iron deficiency anemia, unspecified: Secondary | ICD-10-CM | POA: Diagnosis not present

## 2021-03-23 DIAGNOSIS — Z7982 Long term (current) use of aspirin: Secondary | ICD-10-CM | POA: Diagnosis not present

## 2021-03-23 DIAGNOSIS — I10 Essential (primary) hypertension: Secondary | ICD-10-CM | POA: Diagnosis not present

## 2021-03-23 DIAGNOSIS — Z801 Family history of malignant neoplasm of trachea, bronchus and lung: Secondary | ICD-10-CM | POA: Diagnosis not present

## 2021-03-23 DIAGNOSIS — E559 Vitamin D deficiency, unspecified: Secondary | ICD-10-CM | POA: Diagnosis not present

## 2021-03-23 DIAGNOSIS — Z79899 Other long term (current) drug therapy: Secondary | ICD-10-CM | POA: Diagnosis not present

## 2021-03-23 DIAGNOSIS — Z9221 Personal history of antineoplastic chemotherapy: Secondary | ICD-10-CM | POA: Diagnosis not present

## 2021-03-23 DIAGNOSIS — Z8249 Family history of ischemic heart disease and other diseases of the circulatory system: Secondary | ICD-10-CM | POA: Diagnosis not present

## 2021-03-23 DIAGNOSIS — Z923 Personal history of irradiation: Secondary | ICD-10-CM | POA: Diagnosis not present

## 2021-03-23 DIAGNOSIS — Z833 Family history of diabetes mellitus: Secondary | ICD-10-CM | POA: Diagnosis not present

## 2021-03-23 DIAGNOSIS — Z452 Encounter for adjustment and management of vascular access device: Secondary | ICD-10-CM | POA: Diagnosis not present

## 2021-03-23 DIAGNOSIS — Z9049 Acquired absence of other specified parts of digestive tract: Secondary | ICD-10-CM | POA: Diagnosis not present

## 2021-03-23 NOTE — Patient Instructions (Addendum)
[place] at [facility] Discharge Instructions  You were seen today by Dr. Delton Coombes. He went over your recent results and scans. You will be referred to pulmonology for management of your COPD. Dr. Delton Coombes will see you back in 3 months for labs and follow up.   Thank you for choosing [place] at [facility] to provide your oncology and hematology care.  To afford each patient quality time with our provider, please arrive at least 15 minutes before your scheduled appointment time.   If you have a lab appointment with the [facility] please come in thru the Main Entrance and check in at the main information desk  You need to re-schedule your appointment should you arrive 10 or more minutes late.  We strive to give you quality time with our providers, and arriving late affects you and other patients whose appointments are after yours.  Also, if you no show three or more times for appointments you may be dismissed from the clinic at the providers discretion.     Again, thank you for choosing [facility].  Our hope is that these requests will decrease the amount of time that you wait before being seen by our physicians.       _____________________________________________________________  Should you have questions after your visit to [facility], please contact our office at [phone] between the hours of 8:00 a.m. and 4:30 p.m.  Voicemails left after 4:00 p.m. will not be returned until the following business day.  For prescription refill requests, have your pharmacy contact our office and allow 72 hours.    Cancer Center Support Programs:   > Cancer Support Group  2nd Tuesday of the month 1pm-2pm, Journey Room

## 2021-03-23 NOTE — Progress Notes (Signed)
Emily Pope, [place] [address]   CLINIC:  Medical Oncology/Hematology  PCP:  [name], MD [address] / [place] [address] [phone]   REASON FOR VISIT:  Follow-up for right lung cancer  PRIOR THERAPY:  1. Chemoradiation with carboplatin and paclitaxel from 09/20/2016 to 10/25/2016 with consolidation to 12/06/2016. 2. Durvalumab consolidation from 01/25/2017 to 05/09/2017.  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  S/P concomitant chemoradiation with carboplatin/paclitaxel (09/20/2016- 10/25/2016) followed by 2 cycle of consolidative chemotherapy with carboplatin/paclitaxel (11/15/2016- 12/06/2016).  Unable to tolerate consolidative durvalumab (Imfinzi) beginning on 01/26/2016 and stopped 05/2017.   AND Imfinzi-induced pneumonitis, grade 2, resulting in holding Imfinzi from 04/25/2017- 05/09/2017 with corticosteroids treatment with taper.  This resolved/improved with steroids. Developed pnemonitis again after rechallenge with imfinzi. On steroid taper again.     Primary cancer of right upper lobe of lung ([place])  08/04/2016 PET scan   PET IMPRESSION: 1. 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 Mass is confluent with the right superior hilum and right lower tracheal wall, suggestive of a T4 primary tumor. 2. Postobstructive pneumonia in the right upper lobe. 3. Hypermetabolic ipsilateral and contralateral mediastinal and contralateral hilar lymphadenopathy, suggestive of N3 nodal disease.   08/24/2016 Procedure   Bronchoscopy   08/25/2016 Pathology Results   Diagnosis Endobronchial biopsy, RUL - POORLY DIFFERENTIATED NON-SMALL  CELL CARCINOMA. The immunophenotype is consistent with poorly differentiated adenocarcinoma.   09/10/2016 Imaging   MRI brain- No metastatic disease or acute intracranial abnormality.   09/20/2016 - 10/25/2016 Chemotherapy   Carboplatin/Paclitaxel weekly x 6 with XRT.   09/20/2016 - 10/25/2016 Radiation Therapy     11/15/2016 - 12/06/2016 Chemotherapy   Carboplatin/paclitaxel every 21 days x 2 cycles.   12/30/2016 Imaging   CT CAP- 1. Central right upper lobe lung mass has mildly decreased since 11/21/2016 chest CT angiogram and significantly decreased since 08/04/2016 PET-CT. 2. No pathologically enlarged thoracic lymph nodes. Previously described hypermetabolic thoracic adenopathy on the 08/04/2016 PET-CT has decreased in size. 3. Previously described bilateral lower lobe pulmonary nodules are stable in size. 4. New solitary subsolid 4 mm right middle lobe pulmonary nodule is nonspecific, and may be inflammatory. Recommend attention on a follow-up chest CT in 3 months. 5. No definite metastatic disease in the abdomen. Tiny sub 5 mm low-attenuation lesions in the liver are too small to characterize, favor benign. 6. Aortic atherosclerosis. One vessel coronary atherosclerosis. 7. Moderate emphysema.   01/25/2017 -  Chemotherapy   Durvalumab (Imfinzi) x 52 weeks.    03/31/2017 Imaging   CT chest with contrast: Stable size of partially calcified mass in the posterior right upper lobe.  Increased airspace disease in paramediastinal right upper and superior right lower lobes. Differential diagnosis includes radiation pneumonitis, drug reaction, and infection.  Stable sub-cm right lower lobe pulmonary nodule.  No evidence of lymphadenopathy or pleural effusion.  Emphysema.  Aortic and coronary artery atherosclerosis.   04/25/2017 Adverse Reaction   Progressive cough, concerning for pneumonitis.   04/25/2017 Treatment Plan Change   Progressive cough concerning for  pneumonitis.  Treatment held.  Managed by steroids.   05/09/2017 Treatment Plan Change   Restart Imfinzi with improvement of cough, Grade 1.   05/27/2017  Imaging   CT CHEST WITH CONTRAST IMPRESSION: 1. Similar size of partially calcified posterior right apical lung mass. 2. Similar to minimal increase in surrounding radiation fibrosis. 3. Subtle areas of mosaic attenuation are greater on the right. Likely attributed to mild ground-glass opacity. In the appropriate clinical setting, this could represent drug toxicity induced pneumonitis. 4.  Coronary artery atherosclerosis. Aortic atherosclerosis. 5. Similar right lower lobe pulmonary nodule.   05/27/2017 Adverse Reaction   Developed worsening cough and SOB again after imfinzi. Grade 2. Placed on long steroid taper.  Permanently discontinue any further imfinzi treatments.   07/29/2017 Imaging   CT chest/abd/pelvis: IMPRESSION: 1. Stable post treatment related changes of mass-like radiation fibrosis in the upper right lung with similar appearance of densely calcified mass in the apex of the right upper lobe. No finding to suggest local recurrence of disease or metastatic disease in the chest, abdomen or pelvis on today's examination. 2. 7 mm subpleural nodule in the periphery of the right lower lobe is stable and favored to be benign. Continued attention on followup studies is recommended to ensure stability. 3. Aortic atherosclerosis, in addition to left anterior descending coronary artery disease. Please note that although the presence of coronary artery calcium documents the presence of coronary artery disease, the severity of this disease and any potential stenosis cannot be assessed on this non-gated CT examination. Assessment for potential risk factor modification, dietary therapy or pharmacologic therapy may be warranted, if clinically indicated. 4. There are calcifications of the mitral valve. Echocardiographic correlation for  evaluation of potential valvular dysfunction may be warranted if clinically indicated. 5. Mild diffuse bronchial wall thickening with mild to moderate centrilobular and mild paraseptal emphysema; imaging findings suggestive of underlying COPD. 6. Tip of left subclavian Port-A-Cath is now misdirected into the subclavian vein.     CANCER STAGING: Cancer Staging Primary cancer of right upper lobe of lung ([place]) Staging form: Lung, AJCC 7th Edition - Clinical stage from 09/27/2016: Stage IIIB (T4, N3, M0) - Signed by Baird Cancer, PA-C on 09/27/2016   INTERVAL HISTORY:  Ms. Emily Pope, a 65 y.o. female, returns for routine follow-up of her right lung cancer. Emily Pope was last seen by Dr. Benay Pike on 12/16/2020.   Today she reports feeling okay. Her SOB and cough have improved though not back to baseline and she continues using an inhaler as needed. She was infected with pneumonia in January and spend 7 days hospitalized. Her previous pneumonia infection was 18 months prior.  She has received her 3 COVID vaccines and will get the 4th as soon as it is available.    REVIEW OF SYSTEMS:  Review of Systems  Constitutional: Positive for appetite change (75%) and fatigue (25%).  Respiratory: Positive for cough and shortness of breath.   Musculoskeletal: Positive for arthralgias (4/10 hip pain) and back pain (4/10 back pain).  Psychiatric/Behavioral: Positive for sleep disturbance.  All other systems reviewed and are negative.   PAST MEDICAL/SURGICAL HISTORY:  Past Medical History:  Diagnosis Date  . Anxiety   . Arthritis   . Chronic bronchitis ([place])   . COPD (chronic obstructive pulmonary disease) ([place])   . Essential hypertension   . GERD (gastroesophageal reflux disease)   . Headache   . History of pneumonia   . Pneumonitis   . Primary cancer of right upper lobe of lung ([place])    Stage IIIb adenocarcinoma   Past Surgical History:  Procedure Laterality Date  .  ABDOMINAL HYSTERECTOMY    .  APPENDECTOMY     when had hysterectomy  . CARPAL TUNNEL RELEASE Bilateral   . ENDOBRONCHIAL ULTRASOUND Bilateral 08/23/2016   Procedure: ENDOBRONCHIAL ULTRASOUND;  Surgeon: Collene Gobble, MD;  Location: [facility];  Service: Cardiopulmonary;  Laterality: Bilateral;  . HERNIA REPAIR     left inguinal hernia age 38  . PORTACATH PLACEMENT Left 09/17/2016   Procedure: INSERTION PORT-A-CATH LEFT SUBCLAVIAN;  Surgeon: Aviva Signs, MD;  Location: [facility];  Service: General;  Laterality: Left;  . TUBAL LIGATION      SOCIAL HISTORY:  Social History   Socioeconomic History  . Marital status: Divorced    Spouse name: Not on file  . Number of children: Not on file  . Years of education: Not on file  . Highest education level: Not on file  Occupational History  . Not on file  Tobacco Use  . Smoking status: Former Smoker    Packs/day: 2.00    Years: 43.00    Pack years: 86.00    Quit date: 07/20/2016    Years since quitting: 4.6  . Smokeless tobacco: Never Used  Vaping Use  . Vaping Use: Never used  Substance and Sexual Activity  . Alcohol use: No  . Drug use: No  . Sexual activity: Not on file  Other Topics Concern  . Not on file  Social History Narrative  . Not on file   Social Determinants of Health   Financial Resource Strain: Not on file  Food Insecurity: Not on file  Transportation Needs: Not on file  Physical Activity: Not on file  Stress: Not on file  Social Connections: Not on file  Intimate Partner Violence: Not on file    FAMILY HISTORY:  Family History  Problem Relation Age of Onset  . Hypertension Mother   . Lung cancer Mother   . Hypertension Father   . Diabetes Father   . Hypertension Sister   . Lung cancer Brother   . Hypertension Sister   . Colon cancer Neg Hx     CURRENT MEDICATIONS:  Current Outpatient Medications  Medication Sig Dispense Refill  . albuterol (PROVENTIL HFA;VENTOLIN HFA) 108 (90 Base) MCG/ACT inhaler  Inhale 2 puffs into the lungs every 6 (six) hours as needed for wheezing or shortness of breath. 1 Inhaler 6  . aspirin EC 81 MG tablet Take 81 mg by mouth daily.    Marland Kitchen buPROPion (WELLBUTRIN XL) 150 MG 24 hr tablet Take 150 mg by mouth 2 (two) times daily.    Marland Kitchen docusate sodium (COLACE) 100 MG capsule Take 100 mg by mouth 2 (two) times daily.    . ferrous sulfate 325 (65 FE) MG tablet Take 325 mg by mouth daily with breakfast.    . folic acid (FOLVITE) 1 MG tablet Take 1 tablet by mouth once daily 30 tablet 6  . hydrochlorothiazide (HYDRODIURIL) 12.5 MG tablet Take 12.5 mg by mouth daily.   0  . levocetirizine (XYZAL) 5 MG tablet Take 5 mg by mouth daily.    Marland Kitchen lidocaine-prilocaine (EMLA) cream Apply 1 application topically as needed. 30 g 3  . loratadine (CLARITIN) 10 MG tablet Take 10 mg by mouth daily as needed.   12  . losartan (COZAAR) 50 MG tablet Take 50 mg by mouth daily.   0  . Melatonin 10 MG TABS Take 1 tablet by mouth at bedtime.    . traMADol (ULTRAM) 50 MG tablet Take 1 tablet (50 mg total) by mouth every 6 (six) hours as  needed. for pain 120 tablet 0  . TRELEGY ELLIPTA 100-62.5-25 MCG/INH AEPB Take 1 puff by mouth daily.   0  . vitamin B-12 (CYANOCOBALAMIN) 1000 MCG tablet Take 2,000 mcg by mouth daily.     . Vitamin D, Ergocalciferol, (DRISDOL) 1.25 MG (50000 UNIT) CAPS capsule Take 50,000 Units by mouth once a week.     No current facility-administered medications for this visit.    ALLERGIES:  Allergies  Allergen Reactions  . Clindamycin/Lincomycin Rash  . Codeine Anaphylaxis  . Lincomycin Rash    Also Clindamycin as it has lincomycin in it Also Clindamycin as it has lincomycin in it  . Other Rash  . Penicillins Anaphylaxis    Has patient had a PCN reaction causing immediate rash, facial/tongue/throat swelling, SOB or lightheadedness with hypotension: Unknown Has patient had a PCN reaction causing severe rash involving mucus membranes or skin necrosis: Unknown Has  patient had a PCN reaction that required hospitalization: No Has patient had a PCN reaction occurring within the last 10 years: No If all of the above answers are "NO", then may proceed with Cephalosporin use. Has patient had a PCN reaction causing immediate rash, facial/tongue/throat swelling, SOB or lightheadedness with hypotension: Unknown Has patient had a PCN reaction causing severe rash involving mucus membranes or skin necrosis: Unknown Has patient had a PCN reaction that required hospitalization: No Has patient had a PCN reaction occurring within the last 10 years: No If all of the above answers are "NO", then may proceed with Cephalosporin use.   . Vancomycin Itching    Scalp itching  . Hydrocodone     RASH  . Sulfa Antibiotics Nausea Only    PHYSICAL EXAM:  Performance status (ECOG): 1 - Symptomatic but completely ambulatory  Vitals:   03/23/21 1506  BP: 135/84  Pulse: 88  Resp: (!) 22  Temp: (!) 97 F (36.1 C)  SpO2: 98%   Wt Readings from Last 3 Encounters:  03/23/21 122 lb 12.7 oz (55.7 kg)  12/16/20 128 lb 12 oz (58.4 kg)  06/12/20 126 lb 6.4 oz (57.3 kg)   Physical Exam Vitals reviewed.  Constitutional:      Appearance: Normal appearance.  Neurological:     General: No focal deficit present.     Mental Status: She is alert and oriented to person, place, and time.  Psychiatric:        Mood and Affect: Mood normal.      LABORATORY DATA:  I have reviewed the labs as listed.  CBC Latest Ref Rng & Units 03/13/2021 12/08/2020 06/09/2020  WBC 4.0 - 10.5 K/uL 5.8 4.7 5.7  Hemoglobin 12.0 - 15.0 g/dL 12.6 12.7 12.4  Hematocrit 36.0 - 46.0 % 38.1 38.2 38.2  Platelets 150 - 400 K/uL 312 335 305   CMP Latest Ref Rng & Units 03/13/2021 12/08/2020 06/09/2020  Glucose 70 - 99 mg/dL 100(H) 81 88  BUN 8 - 23 mg/dL 14 20 22   Creatinine 0.44 - 1.00 mg/dL 0.83 0.80 1.00  Sodium 135 - 145 mmol/L 136 134(L) 135  Potassium 3.5 - 5.1 mmol/L 3.8 4.0 4.1  Chloride 98 - 111  mmol/L 104 101 100  CO2 22 - 32 mmol/L 23 25 25   Calcium 8.9 - 10.3 mg/dL 8.8(L) 8.4(L) 9.0  Total Protein 6.5 - 8.1 g/dL 7.0 7.0 7.2  Total Bilirubin 0.3 - 1.2 mg/dL 0.6 0.7 0.3  Alkaline Phos 38 - 126 U/L 87 76 87  AST 15 - 41 U/L 17 19 19  ALT 0 - 44 U/L 17 21 21    Lab Results  Component Value Date   VD25OH 30.52 03/13/2021   VD25OH 113.35 (H) 12/08/2020   VD25OH 22.66 (L) 06/09/2020   Lab Results  Component Value Date   TIBC 311 03/13/2021   TIBC 295 12/08/2020   TIBC 298 06/01/2019   FERRITIN 68 03/13/2021   FERRITIN 97 12/08/2020   FERRITIN 36 06/01/2019   IRONPCTSAT 27 03/13/2021   IRONPCTSAT 41 (H) 12/08/2020   IRONPCTSAT 24 06/01/2019    DIAGNOSTIC IMAGING:  I have independently reviewed the scans and discussed with the patient. CT Chest W Contrast  Result Date: 03/17/2021 CLINICAL DATA:  Right upper lobe lung cancer restaging, new pulmonary nodules on prior examination EXAM: CT CHEST WITH CONTRAST TECHNIQUE: Multidetector CT imaging of the chest was performed during intravenous contrast administration. CONTRAST:  32mL OMNIPAQUE IOHEXOL 300 MG/ML  SOLN COMPARISON:  CT chest angiogram, 01/22/2021, CT chest, 12/12/2019 FINDINGS: Cardiovascular: Left chest port catheter. Aortic atherosclerosis. Normal heart size. Left coronary artery calcifications. No pericardial effusion. Mediastinum/Nodes: Unchanged post treatment appearance of soft tissue about the 6 right hilum. Unchanged enlarged subcarinal lymph node measuring 2.0 x 2.0 cm (series 2, image 56). No enlarged mediastinal, hilar, or axillary lymph nodes. Thyroid gland, trachea, and esophagus demonstrate no significant findings. Lungs/Pleura: Stable post treatment appearance of the right upper lobe, with nearly complete fibrotic consolidation and volume loss is with a centrally calcified mass measuring 3.6 x 2.8 cm, unchanged (series 2, image 38). Bilateral paramedian radiation fibrosis, unchanged. Moderate underlying  centrilobular emphysema. Stable subpleural nodule of the peripheral right lower lobe, measuring 7 mm (series 4, image 111). Substantial interval resolution of a previously noted new pulmonary nodule of the left pulmonary apex, measuring no greater than 3 mm, previously 6 mm (series 4, image 35). A previously noted irregular ground-glass opacity of the dependent left lower lobe is almost completely resolved (series 4, image 73) with new scattered ground-glass opacity throughout the bilateral lower lobes (series 4, image 71, 109). No pleural effusion or pneumothorax. Upper Abdomen: No acute abnormality. Small diverticulum of the gastric fundus. Musculoskeletal: No chest wall mass or suspicious bone lesions identified. IMPRESSION: 1. Stable post treatment appearance of the right upper lobe, with nearly complete fibrotic consolidation and volume loss, with a centrally calcified mass. 2. Unchanged post treatment appearance of soft tissue about the right hilum. Unchanged enlarged subcarinal lymph node measuring 2.0 x 2.0 cm. 3. Substantial interval resolution of a previously noted new pulmonary nodule of the left pulmonary apex, measuring no greater than 3 mm, previously 6 mm. Findings are consistent with resolving infection or inflammation. 4. A previously noted irregular ground-glass opacity of the dependent left lower lobe is almost completely resolved with new scattered ground-glass opacity throughout the bilateral lower lobes. Fluctuant findings are are consistent with ongoing nonspecific infection or inflammation. 5. Stable 7 mm subpleural nodule of the peripheral right lower lobe. Attention on follow-up. 6. Emphysema. 7. Coronary artery disease. Aortic Atherosclerosis (ICD10-I70.0) and Emphysema (ICD10-J43.9). Electronically Signed   By: Eddie Candle M.D.   On: 03/17/2021 09:49     ASSESSMENT:  1.  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 -Durvalumab consolidation from 01/25/2017, discontinued on 05/09/2017 secondary to pneumonitis. -PET scan on 11/20/2018 showed further reduction in activity associated  with the right apical consolidation, likely treatment related, without current evidence of malignancy.  Chronically stable 7 mm nodule in the right lateral costophrenic angle without current hypermetabolic activity. -CT of the chest on 06/01/2019 which showed treatment effect with the right upper lobe with no evidence of local recurrence or residual disease. -CT chest 12/11/2019 showed no changes in right apical consolidation.  A new 3 mm left upper lobe nodule is new since the prior exam.  If new left upper lobe nodule has increased in size this would warrant a PET CT followed by possible biopsy if FDG avid. -CT of the chest done on 06/09/2020 showed stable appearance of posttreatment changes involving the right upper lobe with compensatory hyperinflation of the right middle lobe and right lower lobe.  Stable subpleural nodules in the right lower lobe and 3 mm left upper lobe lung nodule.  Previously noted 5 mm  peribronchovascular nodule now measures 3 mm and is likely postinflammatory. -CT chest on 01/22/2021 [facility] showed new bilateral lower lobe airspace opacities concerning for pneumonia.  Trace bilateral effusions.  No evidence of pulmonary embolism.  Postradiation changes in the right upper lobe, unchanged.  2.  COPD/pneumonitis: -She is using albuterol and Trelegy.  3.  Vitamin D deficiency: -Labs done on 06/09/2020 showed vitamin D level 22.66. -She stopped taking the combination vitamin D and calcium due to constipation. -She will start taking an vitamin D3 by itself. -We will recheck her labs at her next visit.  4.  Iron deficiency  anemia: -She is taking oral iron tablets daily. -Labs done on 06/09/2020 showed hemoglobin 12.4.   PLAN:  1.  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  Unchanged posttreatment appearance of soft tissue in the right hilum.  Unchanged enlarged subcarinal lymph node.  No active infection. -Recommend repeating CT of the chest in 6 months. -She reported 2 episodes of pneumonia in the last 8 months.  We will check serum immunoglobulin levels.  2.  COPD/pneumonitis: -She is using Trelegy and albuterol.  She reports that her breathing is not at her baseline. -Would recommend pulmonary consult.  3.  Vitamin D deficiency: -She is off the prescription strength vitamin D at this time.  Vitamin D level is 30.5, down from 113.35.  We will closely monitor.  4.  Iron deficiency anemia: -Ferritin is 68, down from 97 in December. -RTC 3 months with repeat CBC, ferritin, iron panel.    Orders placed this encounter:  No orders of the defined types were placed in this encounter.    Derek Jack, MD [place] [phone]   I, Milinda Antis, am acting as a scribe for Dr. Sanda Linger.  I, Derek Jack MD, have reviewed the above documentation for accuracy and completeness, and I agree with the above.

## 2021-03-24 ENCOUNTER — Other Ambulatory Visit (HOSPITAL_COMMUNITY): Payer: Self-pay

## 2021-03-24 DIAGNOSIS — C3411 Malignant neoplasm of upper lobe, right bronchus or lung: Secondary | ICD-10-CM

## 2021-03-25 DIAGNOSIS — E1165 Type 2 diabetes mellitus with hyperglycemia: Secondary | ICD-10-CM | POA: Diagnosis not present

## 2021-04-01 DIAGNOSIS — Z299 Encounter for prophylactic measures, unspecified: Secondary | ICD-10-CM | POA: Diagnosis not present

## 2021-04-01 DIAGNOSIS — I10 Essential (primary) hypertension: Secondary | ICD-10-CM | POA: Diagnosis not present

## 2021-04-01 DIAGNOSIS — F32A Depression, unspecified: Secondary | ICD-10-CM | POA: Diagnosis not present

## 2021-04-01 DIAGNOSIS — I700 Atherosclerosis of aorta: Secondary | ICD-10-CM | POA: Diagnosis not present

## 2021-04-01 DIAGNOSIS — J449 Chronic obstructive pulmonary disease, unspecified: Secondary | ICD-10-CM | POA: Diagnosis not present

## 2021-04-24 ENCOUNTER — Ambulatory Visit (INDEPENDENT_AMBULATORY_CARE_PROVIDER_SITE_OTHER): Payer: Medicare Other | Admitting: Pulmonary Disease

## 2021-04-24 ENCOUNTER — Other Ambulatory Visit: Payer: Self-pay

## 2021-04-24 ENCOUNTER — Encounter: Payer: Self-pay | Admitting: Pulmonary Disease

## 2021-04-24 VITALS — BP 142/96 | HR 85 | Temp 98.0°F | Ht 60.0 in | Wt 127.2 lb

## 2021-04-24 DIAGNOSIS — J3089 Other allergic rhinitis: Secondary | ICD-10-CM

## 2021-04-24 DIAGNOSIS — J310 Chronic rhinitis: Secondary | ICD-10-CM | POA: Diagnosis not present

## 2021-04-24 DIAGNOSIS — J432 Centrilobular emphysema: Secondary | ICD-10-CM | POA: Diagnosis not present

## 2021-04-24 DIAGNOSIS — T485X5A Adverse effect of other anti-common-cold drugs, initial encounter: Secondary | ICD-10-CM

## 2021-04-24 MED ORDER — AZITHROMYCIN 250 MG PO TABS: ORAL_TABLET | ORAL | 0 refills | Status: AC

## 2021-04-24 MED ORDER — PREDNISONE 10 MG PO TABS: ORAL_TABLET | ORAL | 0 refills | Status: AC

## 2021-04-24 MED ORDER — MONTELUKAST SODIUM 10 MG PO TABS: 10.0000 mg | ORAL_TABLET | Freq: Every day | ORAL | 5 refills | Status: DC

## 2021-04-24 NOTE — Patient Instructions (Signed)
Prednisone 10 mg pill >> 3 pills daily for 2 days, 2 pills daily for 2 days, 1 pill daily for 2 days  Zithromax 250 mg pill >> 2 pills on day 1, then 1 pill daily for the next 4 days  Singulair 10 mg pill nightly  Will arrange for pulmonary function testing  Follow up in 6 weeks

## 2021-04-24 NOTE — Progress Notes (Signed)
[facility]  Chief Complaint  Patient presents with  . Consult    Productive cough with yellow phlegm since beginning of April 2022    Constitutional:  BP (!) 142/96 (BP Location: Left Arm, Cuff Size: Normal)   Pulse 85   Temp 98 F (36.7 C) (Temporal)   Ht 5' (1.524 m)   Wt 127 lb 3.2 oz (57.7 kg)   LMP 08/04/1985 (Approximate) Comment: hysterectomy @ 65 years old  SpO2 99% Comment: Room air  BMI 24.84 kg/m   Past Medical History:  Anxiety, HTN, GERD, Headache, Pneumonia, NSCLC, Vit D defiency, Iron deficient anemia  Past Surgical History:  She  has a past surgical history that includes Abdominal hysterectomy; Appendectomy; Hernia repair; Tubal ligation; Endobronchial ultrasound (Bilateral, 08/23/2016); Portacath placement (Left, 09/17/2016); and Carpal tunnel release (Bilateral).  Brief Summary:  Emily Pope is a 65 y.o. 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      Subjective:   I saw her in 2017 for lung mass assessment.  Found to have NSCLC.  Treated with chemoradiation in 2017 and darvalumab in 2018.  This was stopped due to pneumonitis.  She has trouble with allergies year round, but especially during the Spring.  This year seems to be particularly bad for her.  She is having sinus congestion, post nasal drip and cough.  This is settling into her chest and she is bringing up yellow sputum.  No hemoptysis.  She is getting episodes of wheezing.  She can't walk more than a block w/o having to rest.  She checks her oxygen level at home and never below 90%.  She was in hospital in January for pneumonia and had pneumonia a few months before this.  She is concerned about using inhaled steroids.  Never told she has asthma.  No skin rashes or leg swelling.  CT chest from March showed stable changes after therapy for lung cancer.  She has been using albuterol 3 to 4 times per day  and this helps.  She uses afrin daily.  If she doesn't then she can't breath through her nose.  Physical Exam:   Appearance - well kempt   ENMT - no sinus tenderness, no oral exudate, no LAN, Mallampati 2 airway, no stridor, narrow nasal angles, pale nasal mucosa, mild erythema of posterior pharynx  Respiratory - bronchial breath sounds Rt upper lung area, faint b/l expiratory wheezing  CV - s1s2 regular rate and rhythm, no murmurs  Ext - no clubbing, no edema  Skin - no rashes  Psych - normal mood and affect   Pulmonary testing:   PFT 09/16/17 >> FEV1 1.79 (82%), FEV1% 89, TLC 3.69 (82%), DLCO 55%  Chest Imaging:   CT chest 03/17/21 >> radiation fibrosis changes RUL with 3.6 x 2.8 calcified mass, b/l paramedian radiation fibrosis, moderate centrilobular emphysema, RLL nodule 7 mm, scattered b/l lower lobe GGO  Sleep Tests:    Cardiac Tests:   Echo 03/15/18 >> EF 60 to 65%, grade 1 DD  Social History:  She  reports that she quit smoking about 4 years ago. She has a 86.00 pack-year smoking history. She has never used smokeless tobacco. She reports that she does not drink alcohol and does not use drugs.  Family History:  Her family history includes Diabetes in her father; Hypertension in her father, mother, sister, and sister; Lung cancer in her brother and mother.  Discussion:  She has progressive dyspnea, productive cough, wheeze, and rhinitis.  She has emphysema and post radiation fibrosis with volume loss.  She has perennial rhinitis and might has a component of asthma.  She had two episodes of pneumonia within the past 18 months.    Assessment/Plan:   Dyspnea on exertion. - has emphysema and likely has COPD - likely has allergic asthma - has an exacerbation at present - will give her course of prednisone and zithromax - will arrange for pulmonary function test - if she has an asthma component, then would prefer to continue inhaled steroid; she has concern about  risk of pneumonia with inhaled steroids - continue trelegy and albuterol  for now - will add singulair - she can continue xyzal in the morning and benadryl at night for now  Perennial allergic rhinitis. - continue xyzal, benadryl - add singulair - she tried an nasal steroid spray before, but had trouble with smell from spray; she isn't sure which spray this was - might need additional allergy testing  Rhinitis medicamentosa. - discussed the concerns about prolonged used of afrin - will need to address further as we go ahead  Recurrent pneumonia. 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 Delton Coombes with Oncology  Time Spent Involved in Patient Care on Day of Examination:  48 minutes  Follow up:  Patient Instructions  Prednisone 10 mg pill >> 3 pills daily for 2 days, 2 pills daily for 2 days, 1 pill daily for 2 days  Zithromax 250 mg pill >> 2 pills on day 1, then 1 pill daily for the next 4 days  Singulair 10 mg pill nightly  Will arrange for pulmonary function testing  Follow up in 6 weeks   Medication List:   Allergies as of 04/24/2021      Reactions   Clindamycin/lincomycin Rash   Codeine Anaphylaxis   Lincomycin Rash   Also Clindamycin as it has lincomycin in it Also Clindamycin as it has lincomycin in it   Other Rash   Penicillins Anaphylaxis   Has patient had a PCN reaction causing immediate rash, facial/tongue/throat swelling, SOB or lightheadedness with hypotension: Unknown Has patient had a PCN reaction causing severe rash involving mucus membranes or skin necrosis: Unknown Has patient had a PCN reaction that required hospitalization: No Has patient  had a PCN reaction occurring within the last 10 years: No If all of the above answers are "NO", then may proceed with Cephalosporin use. Has patient had a PCN reaction causing immediate rash, facial/tongue/throat swelling, SOB or lightheadedness with hypotension: Unknown Has patient had a PCN reaction causing severe rash involving mucus membranes or skin necrosis: Unknown Has patient had a PCN reaction that required hospitalization: No Has patient had a PCN reaction occurring within the last 10 years: No If all of the above answers are "NO", then may proceed with Cephalosporin use.   Vancomycin Itching   Scalp itching   Hydrocodone    RASH   Sulfa Antibiotics Nausea Only      Medication List       Accurate as of April 24, 2021 10:48 AM. If you have any questions, ask your nurse or doctor.  STOP taking these medications   aspirin EC 81 MG tablet Stopped by: Chesley Mires, MD   ferrous sulfate 325 (65 FE) MG tablet Stopped by: Chesley Mires, MD   folic acid 1 MG tablet Commonly known as: FOLVITE Stopped by: Chesley Mires, MD   loratadine 10 MG tablet Commonly known as: CLARITIN Stopped by: Chesley Mires, MD   vitamin B-12 1000 MCG tablet Commonly known as: CYANOCOBALAMIN Stopped by: Chesley Mires, MD   Vitamin D (Ergocalciferol) 1.25 MG (50000 UNIT) Caps capsule Commonly known as: DRISDOL Stopped by: Chesley Mires, MD     TAKE these medications   albuterol 108 (90 Base) MCG/ACT inhaler Commonly known as: VENTOLIN HFA Inhale 2 puffs into the lungs every 6 (six) hours as needed for wheezing or shortness of breath.   azithromycin 250 MG tablet Commonly known as: ZITHROMAX Take 2 tablets (500 mg total) by mouth daily for 1 day, THEN 1 tablet (250 mg total) daily for 4 days. Start taking on: April 24, 2021 Started by: Chesley Mires, MD   buPROPion 150 MG 24 hr tablet Commonly known as: WELLBUTRIN XL Take 150 mg by mouth 2 (two) times daily.   docusate sodium 100 MG  capsule Commonly known as: COLACE Take 100 mg by mouth 2 (two) times daily.   hydrochlorothiazide 12.5 MG tablet Commonly known as: HYDRODIURIL Take 12.5 mg by mouth daily.   levocetirizine 5 MG tablet Commonly known as: XYZAL Take 5 mg by mouth daily.   lidocaine-prilocaine cream Commonly known as: EMLA Apply 1 application topically as needed.   losartan 50 MG tablet Commonly known as: COZAAR Take 50 mg by mouth daily.   Melatonin 10 MG Tabs Take 1 tablet by mouth at bedtime.   montelukast 10 MG tablet Commonly known as: SINGULAIR Take 1 tablet (10 mg total) by mouth at bedtime. Started by: Chesley Mires, MD   predniSONE 10 MG tablet Commonly known as: DELTASONE Take 3 tablets (30 mg total) by mouth daily for 2 days, THEN 2 tablets (20 mg total) daily for 2 days, THEN 1 tablet (10 mg total) daily for 2 days. Start taking on: April 24, 2021 Started by: Chesley Mires, MD   traMADol 50 MG tablet Commonly known as: ULTRAM Take 1 tablet (50 mg total) by mouth every 6 (six) hours as needed. for pain   Trelegy Ellipta 100-62.5-25 MCG/INH Aepb Generic drug: Fluticasone-Umeclidin-Vilant Take 1 puff by mouth daily.       Signature:  Chesley Mires, MD [place] Pager - [phone] 04/24/2021, 10:48 AM

## 2021-05-25 DIAGNOSIS — J45909 Unspecified asthma, uncomplicated: Secondary | ICD-10-CM | POA: Diagnosis not present

## 2021-05-25 DIAGNOSIS — I10 Essential (primary) hypertension: Secondary | ICD-10-CM | POA: Diagnosis not present

## 2021-06-05 ENCOUNTER — Ambulatory Visit (INDEPENDENT_AMBULATORY_CARE_PROVIDER_SITE_OTHER): Payer: Medicare Other | Admitting: Pulmonary Disease

## 2021-06-05 ENCOUNTER — Other Ambulatory Visit: Payer: Self-pay

## 2021-06-05 ENCOUNTER — Encounter: Payer: Self-pay | Admitting: Pulmonary Disease

## 2021-06-05 VITALS — BP 152/92 | HR 89 | Temp 98.0°F | Ht 60.0 in | Wt 124.8 lb

## 2021-06-05 DIAGNOSIS — J4550 Severe persistent asthma, uncomplicated: Secondary | ICD-10-CM

## 2021-06-05 DIAGNOSIS — J432 Centrilobular emphysema: Secondary | ICD-10-CM | POA: Diagnosis not present

## 2021-06-05 DIAGNOSIS — J3089 Other allergic rhinitis: Secondary | ICD-10-CM

## 2021-06-05 DIAGNOSIS — J310 Chronic rhinitis: Secondary | ICD-10-CM | POA: Diagnosis not present

## 2021-06-05 DIAGNOSIS — T485X5A Adverse effect of other anti-common-cold drugs, initial encounter: Secondary | ICD-10-CM

## 2021-06-05 DIAGNOSIS — J45909 Unspecified asthma, uncomplicated: Secondary | ICD-10-CM | POA: Diagnosis not present

## 2021-06-05 MED ORDER — PREDNISONE 10 MG PO TABS: ORAL_TABLET | ORAL | 0 refills | Status: AC

## 2021-06-05 MED ORDER — DIPHENHYDRAMINE HCL 50 MG PO TABS: 50.0000 mg | ORAL_TABLET | Freq: Every evening | ORAL | 0 refills | Status: DC | PRN

## 2021-06-05 NOTE — Patient Instructions (Addendum)
Prednisone 10 mg pill >> 3 pills daily for 2 days, 2 pills daily for 2 days, 1 pill daily for 2 days  Continue trelegy one puff daily, and rinse your mouth after each use  Albuterol two puffs every 6 hours as needed for cough, wheeze, or chest congestion  Xyzal 5 mg pill nightly  Follow up in 8 weeks

## 2021-06-05 NOTE — Progress Notes (Signed)
[facility]  Chief Complaint  Patient presents with   Follow-up    States the Singulair ran blood pressure up so patient stopped taking about two weeks    Constitutional:  BP (!) 152/92 (BP Location: Left Arm, Cuff Size: Normal)   Pulse 89   Temp 98 F (36.7 C) (Temporal)   Ht 5' (1.524 m)   Wt 124 lb 12.8 oz (56.6 kg)   LMP 08/04/1985 (Approximate) Comment: hysterectomy @ 65 years old  SpO2 99% Comment: Room air  BMI 24.37 kg/m   Past Medical History:  Anxiety, HTN, GERD, Headache, Pneumonia, NSCLC, Vit D defiency, Iron deficient anemia  Past Surgical History:  She  has a past surgical history that includes Abdominal hysterectomy; Appendectomy; Hernia repair; Tubal ligation; Endobronchial ultrasound (Bilateral, 08/23/2016); Portacath placement (Left, 09/17/2016); and Carpal tunnel release (Bilateral).  Brief Summary:  Emily Pope is a 65 y.o. 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      Subjective:   She improved after using zithromax and prednisone.  She was doing better with singulair also.  Unfortunately, singular made her feel more sleepy during the day and she thinks singulair also increased her blood pressure.  She stopped singulair and her breathing started to get worse.  She is has sinus congestion, chest tightness, cough, and wheeze.  Feeling more short of breath with activity.  Using trelegy and albuterol - these help.  Still using xyzal at night.  Occasionally uses benadryl also.  Has cut down how much afrin she uses.  She will do lab work later this month and PFT later this month.  Physical Exam:   Appearance - well kempt   ENMT - no sinus tenderness, no oral exudate, no LAN, Mallampati 2 airway, no stridor, narrow nasal angles  Respiratory - faint b/l expiratory wheezing  CV - s1s2 regular rate and rhythm, no murmurs  Ext - no clubbing, no  edema  Skin - no rashes  Psych - normal mood and affect   Pulmonary testing:  PFT 09/16/17 >> FEV1 1.79 (82%), FEV1% 89, TLC 3.69 (82%), DLCO 55%  Chest Imaging:  CT chest 03/17/21 >> radiation fibrosis changes RUL with 3.6 x 2.8 calcified mass, b/l paramedian radiation fibrosis, moderate centrilobular emphysema, RLL nodule 7 mm, scattered b/l lower lobe GGO  Sleep Tests:    Cardiac Tests:  Echo 03/15/18 >> EF 60 to 65%, grade 1 DD  Social History:  She  reports that she quit smoking about 4 years ago. She has a 86.00 pack-year smoking history. She has never used smokeless tobacco. She reports that she does not drink alcohol and does not use drugs.  Family History:  Her family history includes Diabetes in her father; Hypertension in her father, mother, sister, and sister; Lung cancer in her brother and mother.     Assessment/Plan:   Acute asthmatic bronchitis. - will give her an additional course of prednisone   Obstructive lung disease. - likely allergic asthma +/- COPD - she has PFT scheduled for later this month - she has lab work scheduled for later this month; will add RAST with IgE - continue trelegy with prn albuterol - singulair caused daytime sleepiness and elevated blood pressure  Perennial allergic rhinitis. - continue xyzal at night - prn benadryl - she tried an nasal steroid spray before, but had trouble with smell from spray; she isn't sure which spray this was  Rhinitis  medicamentosa. - she is gradually decreasing how much she uses afrin  Recurrent pneumonia. 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 Delton Coombes with Oncology  Time Spent Involved in Patient Care on Day of Examination:  32  minutes  Follow up:   Patient Instructions  Prednisone 10 mg pill >> 3 pills daily for 2 days, 2 pills daily for 2 days, 1 pill daily for 2 days  Continue trelegy one puff daily, and rinse your mouth after each use  Albuterol two puffs every 6 hours as needed for cough, wheeze, or chest congestion  Xyzal 5 mg pill nightly  Follow up in 8 weeks  Medication List:   Allergies as of 06/05/2021       Reactions   Clindamycin/lincomycin Rash   Codeine Anaphylaxis   Lincomycin Rash   Also Clindamycin as it has lincomycin in it Also Clindamycin as it has lincomycin in it   Other Rash   Penicillins Anaphylaxis   Has patient had a PCN reaction causing immediate rash, facial/tongue/throat swelling, SOB or lightheadedness with hypotension: Unknown Has patient had a PCN reaction causing severe rash involving mucus membranes or skin necrosis: Unknown Has patient had a PCN reaction that required hospitalization: No Has patient had a PCN reaction occurring within the last 10 years: No If all of the above answers are "NO", then may proceed with Cephalosporin use. Has patient had a PCN reaction causing immediate rash, facial/tongue/throat swelling, SOB or lightheadedness with hypotension: Unknown Has patient had a PCN reaction causing severe rash involving mucus membranes or skin necrosis: Unknown Has patient had a PCN reaction that required hospitalization: No Has patient had a PCN reaction occurring within the last 10 years: No If all of the above answers are "NO", then may proceed with Cephalosporin use.   Vancomycin Itching   Scalp itching   Hydrocodone    RASH   Sulfa Antibiotics Nausea Only        Medication List        Accurate as of June 05, 2021 10:01 AM. If you have any questions, ask your nurse or doctor.          STOP taking these medications    montelukast 10 MG tablet Commonly known as: SINGULAIR Stopped by: Chesley Mires, MD       TAKE these medications     albuterol 108 (90 Base) MCG/ACT inhaler Commonly known as: VENTOLIN HFA Inhale 2 puffs into the lungs every 6 (six) hours as needed for wheezing or shortness of breath.   buPROPion 150 MG 24 hr tablet Commonly known as: WELLBUTRIN XL Take 150 mg by mouth 2 (two) times daily.   diphenhydrAMINE 50 MG tablet Commonly known as: BENADRYL Take 1 tablet (50 mg total) by mouth at bedtime as needed for itching. Started by: Chesley Mires, MD   docusate sodium 100 MG capsule Commonly known as: COLACE Take 100 mg by mouth 2 (two) times daily.   hydrochlorothiazide 12.5 MG tablet Commonly known as: HYDRODIURIL Take 12.5 mg by mouth daily.   levocetirizine 5 MG tablet Commonly known as: XYZAL Take 5 mg by mouth daily.   lidocaine-prilocaine cream Commonly known as: EMLA Apply 1 application topically as needed.   losartan 50 MG tablet Commonly known as:  COZAAR Take 50 mg by mouth daily.   Melatonin 10 MG Tabs Take 1 tablet by mouth at bedtime.   predniSONE 10 MG tablet Commonly known as: DELTASONE Take 3 tablets (30 mg total) by mouth daily with breakfast for 2 days, THEN 2 tablets (20 mg total) daily with breakfast for 2 days, THEN 1 tablet (10 mg total) daily with breakfast for 2 days. Start taking on: June 05, 2021 Started by: Chesley Mires, MD   traMADol 50 MG tablet Commonly known as: ULTRAM Take 1 tablet (50 mg total) by mouth every 6 (six) hours as needed. for pain   Trelegy Ellipta 100-62.5-25 MCG/INH Aepb Generic drug: Fluticasone-Umeclidin-Vilant Take 1 puff by mouth daily.        Signature:  Chesley Mires, MD [place] Pager - [phone] 06/05/2021, 10:01 AM

## 2021-06-12 ENCOUNTER — Telehealth: Payer: Self-pay | Admitting: Pulmonary Disease

## 2021-06-12 NOTE — Telephone Encounter (Signed)
Emily Pope has left a vm for Respiratory Therapy to ask if covid test is needed and Emily Pope has sent out a message to see if she can get info.

## 2021-06-12 NOTE — Telephone Encounter (Signed)
Please have [facility] check if she still needs to have COVID testing before PFT since she has received COVID vaccine.

## 2021-06-12 NOTE — Telephone Encounter (Signed)
Called and spoke with patient who states can't take covid test for upcoming PFT.  States [employer] and [facility] want to charge $80 and ins.won't pay.  Offered [facility] transportation service but declined by patient.  Patient has PFT scheduled at [facility] on 06/16/21 but patient is stating she doesn't want to drive (due to gas prices) to [place] for covid testing and states she can't drive that far for covid test to [place] because her car is 64 years old, patient was offered [facility] transportation but declined and there is only 2 places in [place] that do covid testing and it costs $80.  Probation officer called [facility] in [place] who stated they do rapid and PCR COVID testing with no charge and patient would just need to schedule for test on [employer]. Writer called patient back and told her that [facility] does COVID testing for free and she just has to go to [employer] to schedule when it would be good for her and she said "yeah well I don't even know where to start with that stuff so just cancel PFT test and I will talk to the doctor about it when I see him at my next appointment." and hung up phone.   Will route to Dr Halford Chessman as Juluis Rainier that patient is declining PFT testing at this time.

## 2021-06-16 ENCOUNTER — Inpatient Hospital Stay (HOSPITAL_COMMUNITY)
Admission: RE | Admit: 2021-06-16 | Discharge: 2021-06-16 | Disposition: A | Discharge status: Home / Self Care | Payer: Medicare Other | Source: Ambulatory Visit | Attending: Pulmonary Disease | Admitting: Pulmonary Disease

## 2021-06-16 NOTE — Telephone Encounter (Signed)
I spoke to [place] in Resp Therapy.  She states pt's are still required to get covid test for pft's at hospital even if they have been covid tested.  She states she spoke to someone in our office and made them aware.  Pt had pft appt today and did not show for the appt.  Nothing further needed.

## 2021-06-17 ENCOUNTER — Inpatient Hospital Stay (HOSPITAL_COMMUNITY): Payer: Medicare Other | Attending: Hematology

## 2021-06-17 ENCOUNTER — Other Ambulatory Visit: Payer: Self-pay

## 2021-06-17 DIAGNOSIS — Z9221 Personal history of antineoplastic chemotherapy: Secondary | ICD-10-CM | POA: Insufficient documentation

## 2021-06-17 DIAGNOSIS — C3411 Malignant neoplasm of upper lobe, right bronchus or lung: Secondary | ICD-10-CM

## 2021-06-17 DIAGNOSIS — Z801 Family history of malignant neoplasm of trachea, bronchus and lung: Secondary | ICD-10-CM | POA: Insufficient documentation

## 2021-06-17 DIAGNOSIS — E611 Iron deficiency: Secondary | ICD-10-CM | POA: Insufficient documentation

## 2021-06-17 DIAGNOSIS — J440 Chronic obstructive pulmonary disease with acute lower respiratory infection: Secondary | ICD-10-CM | POA: Diagnosis not present

## 2021-06-17 DIAGNOSIS — Z90710 Acquired absence of both cervix and uterus: Secondary | ICD-10-CM | POA: Diagnosis not present

## 2021-06-17 DIAGNOSIS — Z8249 Family history of ischemic heart disease and other diseases of the circulatory system: Secondary | ICD-10-CM | POA: Insufficient documentation

## 2021-06-17 DIAGNOSIS — Z87891 Personal history of nicotine dependence: Secondary | ICD-10-CM | POA: Insufficient documentation

## 2021-06-17 DIAGNOSIS — Z833 Family history of diabetes mellitus: Secondary | ICD-10-CM | POA: Insufficient documentation

## 2021-06-17 DIAGNOSIS — Z923 Personal history of irradiation: Secondary | ICD-10-CM | POA: Insufficient documentation

## 2021-06-17 DIAGNOSIS — Z79899 Other long term (current) drug therapy: Secondary | ICD-10-CM | POA: Insufficient documentation

## 2021-06-17 DIAGNOSIS — I10 Essential (primary) hypertension: Secondary | ICD-10-CM | POA: Insufficient documentation

## 2021-06-17 LAB — COMPREHENSIVE METABOLIC PANEL
ALT: 20 U/L (ref 0–44)
AST: 19 U/L (ref 15–41)
Albumin: 4 g/dL (ref 3.5–5.0)
Alkaline Phosphatase: 98 U/L (ref 38–126)
Anion gap: 11 (ref 5–15)
BUN: 15 mg/dL (ref 8–23)
CO2: 25 mmol/L (ref 22–32)
Calcium: 8.9 mg/dL (ref 8.9–10.3)
Chloride: 99 mmol/L (ref 98–111)
Creatinine, Ser: 1.02 mg/dL — ABNORMAL HIGH (ref 0.44–1.00)
GFR, Estimated: >60 mL/min (ref >60)
Glucose, Bld: 90 mg/dL (ref 70–99)
Potassium: 3.9 mmol/L (ref 3.5–5.1)
Sodium: 135 mmol/L (ref 135–145)
Total Bilirubin: 0.5 mg/dL (ref 0.3–1.2)
Total Protein: 7.1 g/dL (ref 6.5–8.1)

## 2021-06-17 LAB — CBC WITH DIFFERENTIAL/PLATELET
Abs Immature Granulocytes: 0.08 10*3/uL — ABNORMAL HIGH (ref 0.00–0.07)
Basophils Absolute: 0.1 10*3/uL (ref 0.0–0.1)
Basophils Relative: 1 %
Eosinophils Absolute: 0.3 10*3/uL (ref 0.0–0.5)
Eosinophils Relative: 3 %
HCT: 40.6 % (ref 36.0–46.0)
Hemoglobin: 13.6 g/dL (ref 12.0–15.0)
Immature Granulocytes: 1 %
Lymphocytes Relative: 16 %
Lymphs Abs: 1.3 10*3/uL (ref 0.7–4.0)
MCH: 33.3 pg (ref 26.0–34.0)
MCHC: 33.5 g/dL (ref 30.0–36.0)
MCV: 99.5 fL (ref 80.0–100.0)
Monocytes Absolute: 0.6 10*3/uL (ref 0.1–1.0)
Monocytes Relative: 8 %
Neutro Abs: 5.8 10*3/uL (ref 1.7–7.7)
Neutrophils Relative %: 71 %
Platelets: 329 10*3/uL (ref 150–400)
RBC: 4.08 MIL/uL (ref 3.87–5.11)
RDW: 13.1 % (ref 11.5–15.5)
WBC: 8.1 10*3/uL (ref 4.0–10.5)
nRBC: 0 % (ref 0.0–0.2)

## 2021-06-17 LAB — IRON AND TIBC
Iron: 102 ug/dL (ref 28–170)
Saturation Ratios: 33 % — ABNORMAL HIGH (ref 10.4–31.8)
TIBC: 314 ug/dL (ref 250–450)
UIBC: 212 ug/dL

## 2021-06-17 LAB — FERRITIN: Ferritin: 65 ng/mL (ref 11–307)

## 2021-06-18 LAB — IGG, IGA, IGM
IgA: 163 mg/dL (ref 87–352)
IgG (Immunoglobin G), Serum: 784 mg/dL (ref 586–1602)
IgM (Immunoglobulin M), Srm: 126 mg/dL (ref 26–217)

## 2021-06-24 ENCOUNTER — Other Ambulatory Visit: Payer: Self-pay

## 2021-06-24 ENCOUNTER — Inpatient Hospital Stay (HOSPITAL_COMMUNITY): Payer: Medicare Other

## 2021-06-24 ENCOUNTER — Encounter (HOSPITAL_COMMUNITY): Payer: Self-pay | Admitting: Hematology and Oncology

## 2021-06-24 ENCOUNTER — Inpatient Hospital Stay (HOSPITAL_BASED_OUTPATIENT_CLINIC_OR_DEPARTMENT_OTHER): Payer: Medicare Other | Admitting: Hematology and Oncology

## 2021-06-24 VITALS — BP 147/91 | HR 84 | Temp 96.9°F | Resp 20 | Wt 127.6 lb

## 2021-06-24 DIAGNOSIS — J189 Pneumonia, unspecified organism: Secondary | ICD-10-CM

## 2021-06-24 DIAGNOSIS — Z8249 Family history of ischemic heart disease and other diseases of the circulatory system: Secondary | ICD-10-CM | POA: Diagnosis not present

## 2021-06-24 DIAGNOSIS — C3411 Malignant neoplasm of upper lobe, right bronchus or lung: Secondary | ICD-10-CM | POA: Diagnosis not present

## 2021-06-24 DIAGNOSIS — Z801 Family history of malignant neoplasm of trachea, bronchus and lung: Secondary | ICD-10-CM | POA: Diagnosis not present

## 2021-06-24 DIAGNOSIS — I10 Essential (primary) hypertension: Secondary | ICD-10-CM | POA: Diagnosis not present

## 2021-06-24 DIAGNOSIS — Z95828 Presence of other vascular implants and grafts: Secondary | ICD-10-CM

## 2021-06-24 DIAGNOSIS — Z79899 Other long term (current) drug therapy: Secondary | ICD-10-CM | POA: Diagnosis not present

## 2021-06-24 DIAGNOSIS — Z87891 Personal history of nicotine dependence: Secondary | ICD-10-CM | POA: Diagnosis not present

## 2021-06-24 DIAGNOSIS — E611 Iron deficiency: Secondary | ICD-10-CM

## 2021-06-24 DIAGNOSIS — Z9221 Personal history of antineoplastic chemotherapy: Secondary | ICD-10-CM | POA: Diagnosis not present

## 2021-06-24 DIAGNOSIS — J440 Chronic obstructive pulmonary disease with acute lower respiratory infection: Secondary | ICD-10-CM | POA: Diagnosis not present

## 2021-06-24 DIAGNOSIS — Z833 Family history of diabetes mellitus: Secondary | ICD-10-CM | POA: Diagnosis not present

## 2021-06-24 DIAGNOSIS — Z923 Personal history of irradiation: Secondary | ICD-10-CM | POA: Diagnosis not present

## 2021-06-24 MED ORDER — HEPARIN SOD (PORK) LOCK FLUSH 100 UNIT/ML IV SOLN: 500.0000 [IU] | Freq: Once | INTRAVENOUS | Status: DC

## 2021-06-24 MED ORDER — SODIUM CHLORIDE 0.9% FLUSH: 10.0000 mL | Freq: Once | INTRAVENOUS | Status: DC

## 2021-06-24 NOTE — Patient Instructions (Signed)
Emily Pope  Discharge Instructions: Thank you for choosing [facility] to provide your oncology and hematology care.  If you have a lab appointment with the [facility], please come in thru the Main Entrance and check in at the main information desk.  Wear comfortable clothing and clothing appropriate for easy access to any Portacath or PICC line.   We strive to give you quality time with your provider. You may need to reschedule your appointment if you arrive late (15 or more minutes).  Arriving late affects you and other patients whose appointments are after yours.  Also, if you miss three or more appointments without notifying the office, you may be dismissed from the clinic at the provider's discretion.      For prescription refill requests, have your pharmacy contact our office and allow 72 hours for refills to be completed.    Today your port was unable to be flushed due to resistance. Appointment made for dye study. Return at scheduled time.    To help prevent nausea and vomiting after your treatment, we encourage you to take your nausea medication as directed.  BELOW ARE SYMPTOMS THAT SHOULD BE REPORTED IMMEDIATELY: *FEVER GREATER THAN 100.4 F (38 C) OR HIGHER *CHILLS OR SWEATING *NAUSEA AND VOMITING THAT IS NOT CONTROLLED WITH YOUR NAUSEA MEDICATION *UNUSUAL SHORTNESS OF BREATH *UNUSUAL BRUISING OR BLEEDING *URINARY PROBLEMS (pain or burning when urinating, or frequent urination) *BOWEL PROBLEMS (unusual diarrhea, constipation, pain near the anus) TENDERNESS IN MOUTH AND THROAT WITH OR WITHOUT PRESENCE OF ULCERS (sore throat, sores in mouth, or a toothache) UNUSUAL RASH, SWELLING OR PAIN  UNUSUAL VAGINAL DISCHARGE OR ITCHING   Items with * indicate a potential emergency and should be followed up as soon as possible or go to the Emergency Department if any problems should occur.  Please show the CHEMOTHERAPY ALERT CARD or IMMUNOTHERAPY ALERT CARD at  check-in to the Emergency Department and triage nurse.  Should you have questions after your visit or need to cancel or reschedule your appointment, please contact [facility] [phone]  and follow the prompts.  Office hours are 8:00 a.m. to 4:30 p.m. Monday - Friday. Please note that voicemails left after 4:00 p.m. may not be returned until the following business day.  We are closed weekends and major holidays. You have access to a nurse at all times for urgent questions. Please call the main number to the clinic [phone] and follow the prompts.  For any non-urgent questions, you may also contact your provider using MyChart. We now offer e-Visits for anyone 75 and older to request care online for non-urgent symptoms. For details visit [email or web address].   Also download the MyChart app! Go to the app store, search "MyChart", open the app, select [facility], and log in with your MyChart username and password.  Due to Covid, a mask is required upon entering the hospital/clinic. If you do not have a mask, one will be given to you upon arrival. For doctor visits, patients may have 1 support person aged 18 or older with them. For treatment visits, patients cannot have anyone with them due to current Covid guidelines and our immunocompromised population.

## 2021-06-24 NOTE — Progress Notes (Signed)
[facility] FOLLOW-UP progress notes  Patient Care Team: [name], MD as PCP - General (Internal Medicine) Gala Romney, Cristopher Estimable, MD as Consulting Physician (Gastroenterology)  CHIEF COMPLAINTS/PURPOSE OF VISIT:  Lung cancer, for further follow-up  HISTORY OF PRESENTING ILLNESS:  Emily Pope 65 y.o. female is seen because her primary oncologist is not available The patient have history of lung cancer status post chemotherapy and immunotherapy Over the last few years, she is being monitored closely with serial surveillance imaging study She is doing well She continues to have mild intermittent productive cough, stable She has shortness of breath on minimal exertion but that is unchanged She denies smoking No recent hemoptysis Her appetite is fair She denies chest pain  I reviewed the patient's records extensive and collaborated the history with the patient. 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  S/P concomitant chemoradiation with carboplatin/paclitaxel (09/20/2016- 10/25/2016) followed by 2 cycle of consolidative chemotherapy with carboplatin/paclitaxel (11/15/2016- 12/06/2016).  Unable to tolerate consolidative durvalumab (Imfinzi) beginning on 01/26/2016 and stopped 05/2017.   AND Imfinzi-induced pneumonitis, grade 2, resulting in holding Imfinzi from 04/25/2017- 05/09/2017 with corticosteroids treatment with taper.  This resolved/improved with steroids. Developed pnemonitis again after rechallenge with imfinzi. On steroid taper again.     Primary cancer of right upper lobe of lung ([place])  08/04/2016 PET scan   PET IMPRESSION: 1. 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 Mass is confluent with the  right superior hilum and right lower tracheal wall, suggestive of a T4 primary tumor. 2. Postobstructive pneumonia in the right upper lobe. 3. Hypermetabolic ipsilateral and contralateral mediastinal and contralateral hilar lymphadenopathy, suggestive of N3 nodal disease.   08/24/2016 Procedure   Bronchoscopy   08/25/2016 Pathology Results   Diagnosis Endobronchial biopsy, RUL - POORLY DIFFERENTIATED NON-SMALL CELL CARCINOMA. The immunophenotype is consistent with poorly differentiated adenocarcinoma.    09/10/2016 Imaging   MRI brain- No metastatic disease or acute intracranial abnormality.    09/20/2016 - 10/25/2016 Chemotherapy   Carboplatin/Paclitaxel weekly x 6 with XRT.    09/20/2016 - 10/25/2016 Radiation Therapy      11/15/2016 - 12/06/2016 Chemotherapy   Carboplatin/paclitaxel every 21 days x 2 cycles.    12/30/2016 Imaging   CT CAP- 1. Central right upper lobe lung mass has mildly decreased since 11/21/2016 chest CT angiogram and significantly decreased since 08/04/2016 PET-CT. 2. No pathologically enlarged thoracic lymph nodes. Previously described hypermetabolic thoracic adenopathy on the 08/04/2016 PET-CT has decreased in size. 3. Previously described bilateral lower lobe pulmonary nodules are stable in size. 4. New solitary subsolid 4 mm right middle lobe pulmonary nodule is nonspecific, and may be inflammatory. Recommend attention on a follow-up chest CT in 3 months. 5. No definite metastatic disease in the abdomen. Tiny sub 5 mm low-attenuation lesions in the liver are too small to characterize, favor benign. 6. Aortic atherosclerosis.  One vessel coronary atherosclerosis. 7. Moderate emphysema.    01/25/2017 -  Chemotherapy   Durvalumab (Imfinzi) x 52 weeks.     03/31/2017 Imaging   CT chest with contrast: Stable size of partially calcified mass in the posterior right upper lobe.   Increased airspace disease in paramediastinal right upper  and superior right lower lobes. Differential diagnosis includes radiation pneumonitis, drug reaction, and infection.   Stable  sub-cm right lower lobe pulmonary nodule.   No evidence of lymphadenopathy or pleural effusion.   Emphysema.   Aortic and coronary artery atherosclerosis.    04/25/2017 Adverse Reaction   Progressive cough, concerning for pneumonitis.    04/25/2017 Treatment Plan Change   Progressive cough concerning for pneumonitis.  Treatment held.  Managed by steroids.    05/09/2017 Treatment Plan Change   Restart Imfinzi with improvement of cough, Grade 1.    05/27/2017 Imaging   CT CHEST WITH CONTRAST IMPRESSION: 1. Similar size of partially calcified posterior right apical lung mass. 2. Similar to minimal increase in surrounding radiation fibrosis. 3. Subtle areas of mosaic attenuation are greater on the right. Likely attributed to mild ground-glass opacity. In the appropriate clinical setting, this could represent drug toxicity induced pneumonitis. 4.  Coronary artery atherosclerosis. Aortic atherosclerosis. 5. Similar right lower lobe pulmonary nodule.    05/27/2017 Adverse Reaction   Developed worsening cough and SOB again after imfinzi. Grade 2. Placed on long steroid taper.  Permanently discontinue any further imfinzi treatments.    07/29/2017 Imaging   CT chest/abd/pelvis: IMPRESSION: 1. Stable post treatment related changes of mass-like radiation fibrosis in the upper right lung with similar appearance of densely calcified mass in the apex of the right upper lobe. No finding to suggest local recurrence of disease or metastatic disease in the chest, abdomen or pelvis on today's examination. 2. 7 mm subpleural nodule in the periphery of the right lower lobe is stable and favored to be benign. Continued attention on followup studies is recommended to ensure stability. 3. Aortic atherosclerosis, in addition to left anterior descending coronary artery  disease. Please note that although the presence of coronary artery calcium documents the presence of coronary artery disease, the severity of this disease and any potential stenosis cannot be assessed on this non-gated CT examination. Assessment for potential risk factor modification, dietary therapy or pharmacologic therapy may be warranted, if clinically indicated. 4. There are calcifications of the mitral valve. Echocardiographic correlation for evaluation of potential valvular dysfunction may be warranted if clinically indicated. 5. Mild diffuse bronchial wall thickening with mild to moderate centrilobular and mild paraseptal emphysema; imaging findings suggestive of underlying COPD. 6. Tip of left subclavian Port-A-Cath is now misdirected into the subclavian vein.     MEDICAL HISTORY:  Past Medical History:  Diagnosis Date   Anxiety    Arthritis    Chronic bronchitis (HCC)    COPD (chronic obstructive pulmonary disease) ([place])    Essential hypertension    GERD (gastroesophageal reflux disease)    Headache    History of pneumonia    Pneumonitis    Primary cancer of right upper lobe of lung (HCC)    Stage IIIb adenocarcinoma    SURGICAL HISTORY: Past Surgical History:  Procedure Laterality Date   ABDOMINAL HYSTERECTOMY     APPENDECTOMY     when had hysterectomy   CARPAL TUNNEL RELEASE Bilateral    ENDOBRONCHIAL ULTRASOUND Bilateral 08/23/2016   Procedure: ENDOBRONCHIAL ULTRASOUND;  Surgeon: Collene Gobble, MD;  Location: [facility];  Service: Cardiopulmonary;  Laterality: Bilateral;   HERNIA REPAIR     left inguinal hernia age 27   PORTACATH PLACEMENT Left 09/17/2016   Procedure: INSERTION PORT-A-CATH LEFT SUBCLAVIAN;  Surgeon: Aviva Signs, MD;  Location: [facility];  Service: General;  Laterality: Left;   TUBAL LIGATION      SOCIAL HISTORY: Social History   Socioeconomic History   Marital status: Divorced    Spouse name:  Not on file   Number of children: Not on  file   Years of education: Not on file   Highest education level: Not on file  Occupational History   Not on file  Tobacco Use   Smoking status: Former    Packs/day: 2.00    Years: 43.00    Pack years: 86.00    Types: Cigarettes    Quit date: 07/20/2016    Years since quitting: 4.9   Smokeless tobacco: Never  Vaping Use   Vaping Use: Never used  Substance and Sexual Activity   Alcohol use: No   Drug use: No   Sexual activity: Not on file  Other Topics Concern   Not on file  Social History Narrative   Not on file   Social Determinants of Health   Financial Resource Strain: Low Risk    Difficulty of Paying Living Expenses: Not hard at all  Food Insecurity: No Food Insecurity   Worried About Charity fundraiser in the Last Year: Never true   [place] in the Last Year: Never true  Transportation Needs: No Transportation Needs   Lack of Transportation (Medical): No   Lack of Transportation (Non-Medical): No  Physical Activity: Insufficiently Active   Days of Exercise per Week: 7 days   Minutes of Exercise per Session: 10 min  Stress: No Stress Concern Present   Feeling of Stress : Not at all  Social Connections: Moderately Isolated   Frequency of Communication with Friends and Family: More than three times a week   Frequency of Social Gatherings with Friends and Family: Once a week   Attends Religious Services: Never   Marine scientist or Organizations: No   Attends Music therapist: Never   Marital Status: Married  Human resources officer Violence: Not At Risk   Fear of Current or Ex-Partner: No   Emotionally Abused: No   Physically Abused: No   Sexually Abused: No    FAMILY HISTORY: Family History  Problem Relation Age of Onset   Hypertension Mother    Lung cancer Mother    Hypertension Father    Diabetes Father    Hypertension Sister    Lung cancer Brother    Hypertension Sister    Colon cancer Neg Hx     ALLERGIES:  is allergic to  clindamycin/lincomycin, codeine, lincomycin, other, penicillins, vancomycin, hydrocodone, and sulfa antibiotics.  MEDICATIONS:  Current Outpatient Medications  Medication Sig Dispense Refill   albuterol (PROVENTIL HFA;VENTOLIN HFA) 108 (90 Base) MCG/ACT inhaler Inhale 2 puffs into the lungs every 6 (six) hours as needed for wheezing or shortness of breath. 1 Inhaler 6   buPROPion (WELLBUTRIN XL) 150 MG 24 hr tablet Take 150 mg by mouth 2 (two) times daily.     docusate sodium (COLACE) 100 MG capsule Take 100 mg by mouth 2 (two) times daily.     hydrochlorothiazide (HYDRODIURIL) 12.5 MG tablet Take 12.5 mg by mouth daily.   0   levocetirizine (XYZAL) 5 MG tablet Take 5 mg by mouth daily.     losartan (COZAAR) 50 MG tablet Take 50 mg by mouth daily.   0   Melatonin 10 MG TABS Take 1 tablet by mouth at bedtime.     traMADol (ULTRAM) 50 MG tablet Take 1 tablet (50 mg total) by mouth every 6 (six) hours as needed. for pain 120 tablet 0   TRELEGY ELLIPTA 100-62.5-25 MCG/INH AEPB Take 1 puff by mouth daily.  0   lidocaine-prilocaine (EMLA) cream Apply 1 application topically as needed. (Patient not taking: Reported on 06/24/2021) 30 g 3   No current facility-administered medications for this visit.   Facility-Administered Medications Ordered in Other Visits  Medication Dose Route Frequency Provider Last Rate Last Admin   heparin lock flush 100 unit/mL  500 Units Intravenous Once Alvy Bimler, Belinda Bringhurst, MD       sodium chloride flush (NS) 0.9 % injection 10 mL  10 mL Intravenous Once Alvy Bimler, Alwin Lanigan, MD        REVIEW OF SYSTEMS:   Constitutional: Denies fevers, chills or abnormal night sweats Eyes: Denies blurriness of vision, double vision or watery eyes Ears, nose, mouth, throat, and face: Denies mucositis or sore throat Cardiovascular: Denies palpitation, chest discomfort or lower extremity swelling Gastrointestinal:  Denies nausea, heartburn or change in bowel habits Skin: Denies abnormal skin  rashes Lymphatics: Denies new lymphadenopathy or easy bruising Neurological:Denies numbness, tingling or new weaknesses Behavioral/Psych: Mood is stable, no new changes  All other systems were reviewed with the patient and are negative.  PHYSICAL EXAMINATION: ECOG PERFORMANCE STATUS: 1 - Symptomatic but completely ambulatory  Vitals:   06/24/21 1406  BP: (!) 147/91  Pulse: 84  Resp: 20  Temp: (!) 96.9 F (36.1 C)  SpO2: 97%   Filed Weights   06/24/21 1406  Weight: 127 lb 9.6 oz (57.9 kg)    GENERAL:alert, no distress and comfortable SKIN: skin color, texture, turgor are normal, no rashes or significant lesions EYES: normal, conjunctiva are pink and non-injected, sclera clear OROPHARYNX:no exudate, normal lips, buccal mucosa, and tongue  NECK: supple, thyroid normal size, non-tender, without nodularity LYMPH:  no palpable lymphadenopathy in the cervical, axillary or inguinal LUNGS: clear to auscultation and percussion with normal breathing effort HEART: regular rate & rhythm and no murmurs without lower extremity edema ABDOMEN:abdomen soft, non-tender and normal bowel sounds Musculoskeletal:no cyanosis of digits and no clubbing  PSYCH: alert & oriented x 3 with fluent speech NEURO: no focal motor/sensory deficits  LABORATORY DATA:  I have reviewed the data as listed Lab Results  Component Value Date   WBC 8.1 06/17/2021   HGB 13.6 06/17/2021   HCT 40.6 06/17/2021   MCV 99.5 06/17/2021   PLT 329 06/17/2021   Recent Labs    12/08/20 1029 03/13/21 0903 06/17/21 1408  NA 134* 136 135  K 4.0 3.8 3.9  CL 101 104 99  CO2 25 23 25   GLUCOSE 81 100* 90  BUN 20 14 15   CREATININE 0.80 0.83 1.02*  CALCIUM 8.4* 8.8* 8.9  GFRNONAA >60 >60 >60  PROT 7.0 7.0 7.1  ALBUMIN 3.9 3.7 4.0  AST 19 17 19   ALT 21 17 20   ALKPHOS 76 87 98  BILITOT 0.7 0.6 0.5    ASSESSMENT & PLAN:  Primary cancer of right upper lobe of lung (HCC) Clinically, her examination is benign She  will return again in 3 months for further follow-up and repeat imaging study  Pneumonitis She has mild intermittent cough Her examination is benign Observe with repeat imaging study as above  Iron deficiency She has borderline iron deficiency but no signs of anemia Observe for now  Orders Placed This Encounter  Procedures   CT Chest W Contrast    Standing Status:   Future    Standing Expiration Date:   06/24/2022    Order Specific Question:   If indicated for the ordered procedure, I authorize the administration of contrast media per Radiology protocol  Answer:   Yes    Order Specific Question:   Preferred imaging location?    Answer:   [facility]    All questions were answered. The patient knows to call the clinic with any problems, questions or concerns. The total time spent in the appointment was 20 minutes encounter with patients including review of chart and various tests results, discussions about plan of care and coordination of care plan   Heath Lark, MD 06/24/2021 2:52 PM

## 2021-06-24 NOTE — Assessment & Plan Note (Signed)
Clinically, her examination is benign She will return again in 3 months for further follow-up and repeat imaging study

## 2021-06-24 NOTE — Progress Notes (Signed)
Patient presents today for port flush. Two Rns unable to flush port, and patient reports discomfort when attempting to flush. Dr. Alvy Bimler made aware and orders were placed for a dye study. VSS with discharge and patient left in satisfactory condition.

## 2021-06-24 NOTE — Assessment & Plan Note (Signed)
She has mild intermittent cough Her examination is benign Observe with repeat imaging study as above

## 2021-06-24 NOTE — Assessment & Plan Note (Signed)
She has borderline iron deficiency but no signs of anemia Observe for now

## 2021-06-25 ENCOUNTER — Encounter (HOSPITAL_COMMUNITY): Payer: Self-pay

## 2021-06-25 ENCOUNTER — Ambulatory Visit (HOSPITAL_COMMUNITY)
Admission: RE | Admit: 2021-06-25 | Discharge: 2021-06-25 | Disposition: A | Discharge status: Home / Self Care | Payer: Medicare Other | Source: Ambulatory Visit | Attending: Physician Assistant | Admitting: Physician Assistant

## 2021-06-25 DIAGNOSIS — C3411 Malignant neoplasm of upper lobe, right bronchus or lung: Secondary | ICD-10-CM

## 2021-06-25 DIAGNOSIS — Z95828 Presence of other vascular implants and grafts: Secondary | ICD-10-CM | POA: Diagnosis not present

## 2021-06-25 DIAGNOSIS — T82828A Fibrosis of vascular prosthetic devices, implants and grafts, initial encounter: Secondary | ICD-10-CM | POA: Diagnosis not present

## 2021-06-25 MED ORDER — HEPARIN SOD (PORK) LOCK FLUSH 100 UNIT/ML IV SOLN: 500.0000 [IU] | Freq: Once | INTRAVENOUS | Status: AC

## 2021-06-25 MED ORDER — HEPARIN SOD (PORK) LOCK FLUSH 100 UNIT/ML IV SOLN
INTRAVENOUS | Status: AC
  Administered 2021-06-25: 500 [IU] via INTRAVENOUS
  Filled 2021-06-25: qty 5

## 2021-06-25 MED ORDER — IOHEXOL 300 MG/ML  SOLN
10.0000 mL | Freq: Once | INTRAMUSCULAR | Status: AC | PRN
  Administered 2021-06-25: 5 mL via INTRAVENOUS

## 2021-06-26 ENCOUNTER — Other Ambulatory Visit (HOSPITAL_COMMUNITY): Payer: Self-pay

## 2021-06-26 ENCOUNTER — Other Ambulatory Visit (HOSPITAL_COMMUNITY): Payer: Medicare Other

## 2021-06-26 DIAGNOSIS — C3411 Malignant neoplasm of upper lobe, right bronchus or lung: Secondary | ICD-10-CM

## 2021-06-30 DIAGNOSIS — R5383 Other fatigue: Secondary | ICD-10-CM | POA: Diagnosis not present

## 2021-06-30 DIAGNOSIS — Z299 Encounter for prophylactic measures, unspecified: Secondary | ICD-10-CM | POA: Diagnosis not present

## 2021-06-30 DIAGNOSIS — Z7189 Other specified counseling: Secondary | ICD-10-CM | POA: Diagnosis not present

## 2021-06-30 DIAGNOSIS — Z0000 Encounter for general adult medical examination without abnormal findings: Secondary | ICD-10-CM | POA: Diagnosis not present

## 2021-06-30 DIAGNOSIS — D692 Other nonthrombocytopenic purpura: Secondary | ICD-10-CM | POA: Diagnosis not present

## 2021-06-30 DIAGNOSIS — E7800 Pure hypercholesterolemia, unspecified: Secondary | ICD-10-CM | POA: Diagnosis not present

## 2021-06-30 DIAGNOSIS — F17210 Nicotine dependence, cigarettes, uncomplicated: Secondary | ICD-10-CM | POA: Diagnosis not present

## 2021-06-30 DIAGNOSIS — Z79899 Other long term (current) drug therapy: Secondary | ICD-10-CM | POA: Diagnosis not present

## 2021-07-01 DIAGNOSIS — Z08 Encounter for follow-up examination after completed treatment for malignant neoplasm: Secondary | ICD-10-CM | POA: Diagnosis not present

## 2021-07-01 DIAGNOSIS — C3411 Malignant neoplasm of upper lobe, right bronchus or lung: Secondary | ICD-10-CM | POA: Diagnosis not present

## 2021-07-01 DIAGNOSIS — Z85118 Personal history of other malignant neoplasm of bronchus and lung: Secondary | ICD-10-CM | POA: Diagnosis not present

## 2021-07-01 DIAGNOSIS — Z6824 Body mass index (BMI) 24.0-24.9, adult: Secondary | ICD-10-CM | POA: Diagnosis not present

## 2021-07-03 ENCOUNTER — Other Ambulatory Visit: Payer: Self-pay | Admitting: Hematology and Oncology

## 2021-07-03 DIAGNOSIS — C3411 Malignant neoplasm of upper lobe, right bronchus or lung: Secondary | ICD-10-CM

## 2021-07-03 MED ORDER — TRAMADOL HCL 50 MG PO TABS: 50.0000 mg | ORAL_TABLET | Freq: Four times a day (QID) | ORAL | 0 refills | Status: DC | PRN

## 2021-07-20 DIAGNOSIS — M818 Other osteoporosis without current pathological fracture: Secondary | ICD-10-CM | POA: Diagnosis not present

## 2021-07-23 ENCOUNTER — Other Ambulatory Visit: Payer: Self-pay | Admitting: Internal Medicine

## 2021-07-23 DIAGNOSIS — Z139 Encounter for screening, unspecified: Secondary | ICD-10-CM

## 2021-07-26 DIAGNOSIS — J45909 Unspecified asthma, uncomplicated: Secondary | ICD-10-CM | POA: Diagnosis not present

## 2021-07-26 DIAGNOSIS — I10 Essential (primary) hypertension: Secondary | ICD-10-CM | POA: Diagnosis not present

## 2021-07-27 ENCOUNTER — Ambulatory Visit
Admission: RE | Admit: 2021-07-27 | Discharge: 2021-07-27 | Disposition: A | Discharge status: Home / Self Care | Payer: Medicare Other | Source: Ambulatory Visit | Attending: Internal Medicine | Admitting: Internal Medicine

## 2021-07-27 ENCOUNTER — Other Ambulatory Visit: Payer: Self-pay

## 2021-07-27 DIAGNOSIS — Z139 Encounter for screening, unspecified: Secondary | ICD-10-CM

## 2021-07-31 ENCOUNTER — Encounter: Payer: Self-pay | Admitting: Pulmonary Disease

## 2021-07-31 ENCOUNTER — Other Ambulatory Visit: Payer: Self-pay

## 2021-07-31 ENCOUNTER — Ambulatory Visit (INDEPENDENT_AMBULATORY_CARE_PROVIDER_SITE_OTHER): Payer: Medicare Other | Admitting: Pulmonary Disease

## 2021-07-31 VITALS — BP 130/90 | HR 86 | Temp 98.4°F | Ht 60.0 in | Wt 127.0 lb

## 2021-07-31 DIAGNOSIS — J432 Centrilobular emphysema: Secondary | ICD-10-CM | POA: Diagnosis not present

## 2021-07-31 DIAGNOSIS — J3089 Other allergic rhinitis: Secondary | ICD-10-CM

## 2021-07-31 NOTE — Progress Notes (Signed)
[facility]  Chief Complaint  Patient presents with   Follow-up    Patient has not had PFT done, states that her breathing is the same since last visit. States she does not feel better but has not got worse.     Constitutional:  BP 130/90 (BP Location: Left Arm, Patient Position: Sitting, Cuff Size: Normal)   Pulse 86   Temp 98.4 F (36.9 C) (Oral)   Ht 5' (1.524 m)   Wt 127 lb (57.6 kg)   LMP 08/04/1985 (Approximate) Comment: hysterectomy @ 65 years old  SpO2 98%   BMI 24.80 kg/m   Past Medical History:  Anxiety, HTN, GERD, Headache, Pneumonia, NSCLC, Vit D defiency, Iron deficient anemia  Past Surgical History:  She  has a past surgical history that includes Abdominal hysterectomy; Appendectomy; Hernia repair; Tubal ligation; Endobronchial ultrasound (Bilateral, 08/23/2016); Portacath placement (Left, 09/17/2016); and Carpal tunnel release (Bilateral).  Brief Summary:  Emily Pope is a 65 y.o. 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      Subjective:   She didn't have PFT done yet.  She was getting the run around about where she could do pre-procedure COVID testing.  Didn't get RAST with IgE drawn.  Her symptoms have been stable most recently.  She does okay as long as she stays out of the heat.  Has cough with milky white sputum.  No fever, chest pain, or hemoptysis.  Uses albuterol few times per day and this helps.  Physical Exam:   Appearance - well kempt   ENMT - no sinus tenderness, no oral exudate, no LAN, Mallampati 2 airway, no stridor  Respiratory - equal breath sounds bilaterally, no wheezing or rales  CV - s1s2 regular rate and rhythm, no murmurs  Ext - no clubbing, no edema  Skin - no rashes  Psych - normal mood and affect    Pulmonary testing:  PFT 09/16/17 >> FEV1 1.79 (82%), FEV1% 89, TLC 3.69 (82%), DLCO 55%  Chest Imaging:  CT chest  03/17/21 >> radiation fibrosis changes RUL with 3.6 x 2.8 calcified mass, b/l paramedian radiation fibrosis, moderate centrilobular emphysema, RLL nodule 7 mm, scattered b/l lower lobe GGO  Cardiac Tests:  Echo 03/15/18 >> EF 60 to 65%, grade 1 DD  Social History:  She  reports that she quit smoking about 5 years ago. She has a 86.00 pack-year smoking history. She has never used smokeless tobacco. She reports that she does not drink alcohol and does not use drugs.  Family History:  Her family history includes Diabetes in her father; Hypertension in her father, mother, sister, and sister; Lung cancer in her brother and mother.     Assessment/Plan:   Obstructive lung disease. - likely allergic asthma +/- COPD - defer repeat PFT testing for now - continue trelegy and prn albuterol - singulair caused daytime sleepiness and elevated blood pressure  Perennial allergic rhinitis. - xyzal qhs with prn benadryl - she is intolerant of nasal steroid sprays due to smell and taste  Recurrent pneumonia. - Ig levels normal from 06/17/21 - continue bronchial hygiene  Stage 3b adenocarcinoma of Rt upper lung diagnosed in 2017. 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 Delton Coombes with Oncology - she has f/u CT chest in September 2022 >> asked her to have this forwarded for my review also  Time Spent Involved in  Patient Care on Day of Examination:  33 minutes  Follow up:   Patient Instructions  Follow up in 6 months  Medication List:   Allergies as of 07/31/2021       Reactions   Clindamycin/lincomycin Rash   Codeine Anaphylaxis   Lincomycin Rash   Also Clindamycin as it has lincomycin in it Also Clindamycin as it has lincomycin in it   Other Rash   Penicillins Anaphylaxis   Has patient had a PCN reaction causing immediate rash, facial/tongue/throat swelling, SOB or lightheadedness with hypotension: Unknown Has patient had a  PCN reaction causing severe rash involving mucus membranes or skin necrosis: Unknown Has patient had a PCN reaction that required hospitalization: No Has patient had a PCN reaction occurring within the last 10 years: No If all of the above answers are "NO", then may proceed with Cephalosporin use. Has patient had a PCN reaction causing immediate rash, facial/tongue/throat swelling, SOB or lightheadedness with hypotension: Unknown Has patient had a PCN reaction causing severe rash involving mucus membranes or skin necrosis: Unknown Has patient had a PCN reaction that required hospitalization: No Has patient had a PCN reaction occurring within the last 10 years: No If all of the above answers are "NO", then may proceed with Cephalosporin use.   Vancomycin Itching   Scalp itching   Hydrocodone    RASH   Sulfa Antibiotics Nausea Only        Medication List        Accurate as of July 31, 2021  9:36 AM. If you have any questions, ask your nurse or doctor.          albuterol 108 (90 Base) MCG/ACT inhaler Commonly known as: VENTOLIN HFA Inhale 2 puffs into the lungs every 6 (six) hours as needed for wheezing or shortness of breath.   buPROPion 150 MG 24 hr tablet Commonly known as: WELLBUTRIN XL Take 150 mg by mouth 2 (two) times daily.   docusate sodium 100 MG capsule Commonly known as: COLACE Take 100 mg by mouth 2 (two) times daily.   hydrochlorothiazide 12.5 MG tablet Commonly known as: HYDRODIURIL Take 12.5 mg by mouth daily.   levocetirizine 5 MG tablet Commonly known as: XYZAL Take 5 mg by mouth daily.   lidocaine-prilocaine cream Commonly known as: EMLA Apply 1 application topically as needed.   losartan 50 MG tablet Commonly known as: COZAAR Take 50 mg by mouth daily.   Melatonin 10 MG Tabs Take 1 tablet by mouth at bedtime.   traMADol 50 MG tablet Commonly known as: ULTRAM Take 1 tablet (50 mg total) by mouth every 6 (six) hours as needed. for pain    Trelegy Ellipta 100-62.5-25 MCG/INH Aepb Generic drug: Fluticasone-Umeclidin-Vilant Take 1 puff by mouth daily.        Signature:  Chesley Mires, MD [place] Pager - [phone] 07/31/2021, 9:36 AM

## 2021-07-31 NOTE — Patient Instructions (Signed)
Follow up in 6 months 

## 2021-08-28 ENCOUNTER — Ambulatory Visit
Admission: RE | Admit: 2021-08-28 | Discharge: 2021-08-28 | Disposition: A | Discharge status: Home / Self Care | Payer: Medicare Other | Source: Ambulatory Visit | Attending: Internal Medicine | Admitting: Internal Medicine

## 2021-08-28 ENCOUNTER — Other Ambulatory Visit: Payer: Self-pay

## 2021-08-28 DIAGNOSIS — Z1231 Encounter for screening mammogram for malignant neoplasm of breast: Secondary | ICD-10-CM | POA: Diagnosis not present

## 2021-09-02 ENCOUNTER — Other Ambulatory Visit: Payer: Self-pay | Admitting: Hematology and Oncology

## 2021-09-02 DIAGNOSIS — C3411 Malignant neoplasm of upper lobe, right bronchus or lung: Secondary | ICD-10-CM

## 2021-09-24 ENCOUNTER — Other Ambulatory Visit: Payer: Self-pay

## 2021-09-24 ENCOUNTER — Inpatient Hospital Stay (HOSPITAL_COMMUNITY): Payer: Medicare Other | Attending: Hematology

## 2021-09-24 ENCOUNTER — Encounter (HOSPITAL_COMMUNITY): Payer: Self-pay

## 2021-09-24 VITALS — BP 157/91 | HR 91 | Temp 97.6°F | Resp 20

## 2021-09-24 DIAGNOSIS — D509 Iron deficiency anemia, unspecified: Secondary | ICD-10-CM | POA: Insufficient documentation

## 2021-09-24 DIAGNOSIS — Z95828 Presence of other vascular implants and grafts: Secondary | ICD-10-CM

## 2021-09-24 DIAGNOSIS — C3411 Malignant neoplasm of upper lobe, right bronchus or lung: Secondary | ICD-10-CM

## 2021-09-24 DIAGNOSIS — Z85118 Personal history of other malignant neoplasm of bronchus and lung: Secondary | ICD-10-CM | POA: Insufficient documentation

## 2021-09-24 LAB — CBC WITH DIFFERENTIAL/PLATELET
Abs Immature Granulocytes: 0.04 10*3/uL (ref 0.00–0.07)
Basophils Absolute: 0.1 10*3/uL (ref 0.0–0.1)
Basophils Relative: 1 %
Eosinophils Absolute: 0.3 10*3/uL (ref 0.0–0.5)
Eosinophils Relative: 4 %
HCT: 41 % (ref 36.0–46.0)
Hemoglobin: 13.7 g/dL (ref 12.0–15.0)
Immature Granulocytes: 1 %
Lymphocytes Relative: 19 %
Lymphs Abs: 1.4 10*3/uL (ref 0.7–4.0)
MCH: 34.2 pg — ABNORMAL HIGH (ref 26.0–34.0)
MCHC: 33.4 g/dL (ref 30.0–36.0)
MCV: 102.2 fL — ABNORMAL HIGH (ref 80.0–100.0)
Monocytes Absolute: 0.5 10*3/uL (ref 0.1–1.0)
Monocytes Relative: 7 %
Neutro Abs: 5.1 10*3/uL (ref 1.7–7.7)
Neutrophils Relative %: 68 %
Platelets: 307 10*3/uL (ref 150–400)
RBC: 4.01 MIL/uL (ref 3.87–5.11)
RDW: 12.1 % (ref 11.5–15.5)
WBC: 7.5 10*3/uL (ref 4.0–10.5)
nRBC: 0 % (ref 0.0–0.2)

## 2021-09-24 LAB — COMPREHENSIVE METABOLIC PANEL
ALT: 21 U/L (ref 0–44)
AST: 21 U/L (ref 15–41)
Albumin: 4 g/dL (ref 3.5–5.0)
Alkaline Phosphatase: 97 U/L (ref 38–126)
Anion gap: 7 (ref 5–15)
BUN: 15 mg/dL (ref 8–23)
CO2: 27 mmol/L (ref 22–32)
Calcium: 9.2 mg/dL (ref 8.9–10.3)
Chloride: 106 mmol/L (ref 98–111)
Creatinine, Ser: 0.95 mg/dL (ref 0.44–1.00)
GFR, Estimated: >60 mL/min (ref >60)
Glucose, Bld: 100 mg/dL — ABNORMAL HIGH (ref 70–99)
Potassium: 4.6 mmol/L (ref 3.5–5.1)
Sodium: 140 mmol/L (ref 135–145)
Total Bilirubin: 0.1 mg/dL — ABNORMAL LOW (ref 0.3–1.2)
Total Protein: 7.3 g/dL (ref 6.5–8.1)

## 2021-09-24 LAB — IRON AND TIBC
Iron: 84 ug/dL (ref 28–170)
Saturation Ratios: 26 % (ref 10.4–31.8)
TIBC: 319 ug/dL (ref 250–450)
UIBC: 235 ug/dL

## 2021-09-24 LAB — FERRITIN: Ferritin: 59 ng/mL (ref 11–307)

## 2021-09-24 LAB — LACTATE DEHYDROGENASE: LDH: 167 U/L (ref 98–192)

## 2021-09-24 MED ORDER — SODIUM CHLORIDE 0.9% FLUSH: 10.0000 mL | Freq: Once | INTRAVENOUS | Status: DC

## 2021-09-24 MED ORDER — HEPARIN SOD (PORK) LOCK FLUSH 100 UNIT/ML IV SOLN: 500.0000 [IU] | Freq: Once | INTRAVENOUS | Status: DC

## 2021-09-24 NOTE — Progress Notes (Signed)
Patient presents today for port flush and lab, unable to flush port d/t resistance, patient had  previous dye study in June. Patient does not report pain when attempting to flush port  No bruising or swelling at site. Bandaid applied. Labs drawn from arm and patient discharged in satisfactory condition. VVS stable with no signs or symptoms of distressed noted.

## 2021-09-24 NOTE — Patient Instructions (Signed)
[facility]  Discharge Instructions: Thank you for choosing [facility] to provide your oncology and hematology care.  If you have a lab appointment with the [facility], please come in thru the Main Entrance and check in at the main information desk.  Wear comfortable clothing and clothing appropriate for easy access to any Portacath or PICC line.   We strive to give you quality time with your provider. You may need to reschedule your appointment if you arrive late (15 or more minutes).  Arriving late affects you and other patients whose appointments are after yours.  Also, if you miss three or more appointments without notifying the office, you may be dismissed from the clinic at the provider's discretion.      For prescription refill requests, have your pharmacy contact our office and allow 72 hours for refills to be completed.    Today you had labs drawn. Port was attempted to be flushed but unable to d/t resistance. Return as scheduled.   To help prevent nausea and vomiting after your treatment, we encourage you to take your nausea medication as directed.  BELOW ARE SYMPTOMS THAT SHOULD BE REPORTED IMMEDIATELY: *FEVER GREATER THAN 100.4 F (38 C) OR HIGHER *CHILLS OR SWEATING *NAUSEA AND VOMITING THAT IS NOT CONTROLLED WITH YOUR NAUSEA MEDICATION *UNUSUAL SHORTNESS OF BREATH *UNUSUAL BRUISING OR BLEEDING *URINARY PROBLEMS (pain or burning when urinating, or frequent urination) *BOWEL PROBLEMS (unusual diarrhea, constipation, pain near the anus) TENDERNESS IN MOUTH AND THROAT WITH OR WITHOUT PRESENCE OF ULCERS (sore throat, sores in mouth, or a toothache) UNUSUAL RASH, SWELLING OR PAIN  UNUSUAL VAGINAL DISCHARGE OR ITCHING   Items with * indicate a potential emergency and should be followed up as soon as possible or go to the Emergency Department if any problems should occur.  Please show the CHEMOTHERAPY ALERT CARD or IMMUNOTHERAPY ALERT CARD at check-in  to the Emergency Department and triage nurse.  Should you have questions after your visit or need to cancel or reschedule your appointment, please contact [facility] [phone]  and follow the prompts.  Office hours are 8:00 a.m. to 4:30 p.m. Monday - Friday. Please note that voicemails left after 4:00 p.m. may not be returned until the following business day.  We are closed weekends and major holidays. You have access to a nurse at all times for urgent questions. Please call the main number to the clinic [phone] and follow the prompts.  For any non-urgent questions, you may also contact your provider using MyChart. We now offer e-Visits for anyone 32 and older to request care online for non-urgent symptoms. For details visit [email or web address].   Also download the MyChart app! Go to the app store, search "MyChart", open the app, select [facility], and log in with your MyChart username and password.  Due to Covid, a mask is required upon entering the hospital/clinic. If you do not have a mask, one will be given to you upon arrival. For doctor visits, patients may have 1 support person aged 75 or older with them. For treatment visits, patients cannot have anyone with them due to current Covid guidelines and our immunocompromised population.

## 2021-09-28 ENCOUNTER — Other Ambulatory Visit: Payer: Self-pay

## 2021-09-28 ENCOUNTER — Ambulatory Visit (HOSPITAL_COMMUNITY)
Admission: RE | Admit: 2021-09-28 | Discharge: 2021-09-28 | Disposition: A | Discharge status: Home / Self Care | Payer: Medicare Other | Source: Ambulatory Visit | Attending: Hematology and Oncology | Admitting: Hematology and Oncology

## 2021-09-28 DIAGNOSIS — I700 Atherosclerosis of aorta: Secondary | ICD-10-CM | POA: Diagnosis not present

## 2021-09-28 DIAGNOSIS — R911 Solitary pulmonary nodule: Secondary | ICD-10-CM | POA: Diagnosis not present

## 2021-09-28 DIAGNOSIS — C3411 Malignant neoplasm of upper lobe, right bronchus or lung: Secondary | ICD-10-CM | POA: Insufficient documentation

## 2021-09-28 DIAGNOSIS — J439 Emphysema, unspecified: Secondary | ICD-10-CM | POA: Diagnosis not present

## 2021-09-28 MED ORDER — IOHEXOL 350 MG/ML SOLN
75.0000 mL | Freq: Once | INTRAVENOUS | Status: AC | PRN
  Administered 2021-09-28: 75 mL via INTRAVENOUS

## 2021-09-30 DIAGNOSIS — Z299 Encounter for prophylactic measures, unspecified: Secondary | ICD-10-CM | POA: Diagnosis not present

## 2021-09-30 DIAGNOSIS — F17210 Nicotine dependence, cigarettes, uncomplicated: Secondary | ICD-10-CM | POA: Diagnosis not present

## 2021-09-30 DIAGNOSIS — Z6824 Body mass index (BMI) 24.0-24.9, adult: Secondary | ICD-10-CM | POA: Diagnosis not present

## 2021-09-30 DIAGNOSIS — J441 Chronic obstructive pulmonary disease with (acute) exacerbation: Secondary | ICD-10-CM | POA: Diagnosis not present

## 2021-09-30 DIAGNOSIS — I10 Essential (primary) hypertension: Secondary | ICD-10-CM | POA: Diagnosis not present

## 2021-09-30 NOTE — Progress Notes (Signed)
Emily Pope, Emily Pope [id number]   CLINIC:  Medical Oncology/Hematology  PCP:  [name], MD [address] / [place] [address] [phone]   REASON FOR VISIT:  Follow-up for right lung cancer  PRIOR THERAPY:  1. Chemoradiation with carboplatin and paclitaxel from 09/20/2016 to 10/25/2016 with consolidation to 12/06/2016. 2. Durvalumab consolidation from 01/25/2017 to 05/09/2017.  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  S/P concomitant chemoradiation with carboplatin/paclitaxel (09/20/2016- 10/25/2016) followed by 2 cycle of consolidative chemotherapy with carboplatin/paclitaxel (11/15/2016- 12/06/2016).  Unable to tolerate consolidative durvalumab (Imfinzi) beginning on 01/26/2016 and stopped 05/2017.   AND Imfinzi-induced pneumonitis, grade 2, resulting in holding Imfinzi from 04/25/2017- 05/09/2017 with corticosteroids treatment with taper.  This resolved/improved with steroids. Developed pnemonitis again after rechallenge with imfinzi. On steroid taper again.     Primary cancer of right upper lobe of lung ([place])  08/04/2016 PET scan   PET IMPRESSION: 1. 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 Mass is confluent with the right superior hilum and right lower tracheal wall, suggestive of a T4 primary tumor. 2. Postobstructive pneumonia in the right upper lobe. 3. Hypermetabolic ipsilateral and contralateral mediastinal and contralateral hilar lymphadenopathy, suggestive of N3 nodal disease.   08/24/2016 Procedure   Bronchoscopy   08/25/2016 Pathology Results   Diagnosis Endobronchial biopsy, RUL - POORLY DIFFERENTIATED NON-SMALL  CELL CARCINOMA. The immunophenotype is consistent with poorly differentiated adenocarcinoma.   09/10/2016 Imaging   MRI brain- No metastatic disease or acute intracranial abnormality.   09/20/2016 - 10/25/2016 Chemotherapy   Carboplatin/Paclitaxel weekly x 6 with XRT.   09/20/2016 - 10/25/2016 Radiation Therapy      11/15/2016 - 12/06/2016 Chemotherapy   Carboplatin/paclitaxel every 21 days x 2 cycles.   12/30/2016 Imaging   CT CAP- 1. Central right upper lobe lung mass has mildly decreased since 11/21/2016 chest CT angiogram and significantly decreased since 08/04/2016 PET-CT. 2. No pathologically enlarged thoracic lymph nodes. Previously described hypermetabolic thoracic adenopathy on the 08/04/2016 PET-CT has decreased in size. 3. Previously described bilateral lower lobe pulmonary nodules are stable in size. 4. New solitary subsolid 4 mm right middle lobe pulmonary nodule is nonspecific, and may be inflammatory. Recommend attention on a follow-up chest CT in 3 months. 5. No definite metastatic disease in the abdomen. Tiny sub 5 mm low-attenuation lesions in the liver are too small to characterize, favor benign. 6. Aortic atherosclerosis.  One vessel coronary atherosclerosis. 7. Moderate emphysema.   01/25/2017 -  Chemotherapy   Durvalumab (Imfinzi) x 52 weeks.    03/31/2017 Imaging   CT chest with contrast: Stable size of partially calcified mass in the posterior right upper lobe.   Increased airspace disease in paramediastinal right upper and superior right lower lobes. Differential diagnosis includes radiation pneumonitis, drug reaction, and infection.   Stable sub-cm right lower lobe pulmonary nodule.   No evidence of lymphadenopathy or pleural effusion.   Emphysema.   Aortic and coronary artery atherosclerosis.   04/25/2017 Adverse Reaction   Progressive cough, concerning for pneumonitis.   04/25/2017 Treatment Plan Change   Progressive cough concerning for  pneumonitis.  Treatment held.  Managed by steroids.   05/09/2017 Treatment Plan Change   Restart Imfinzi with improvement  of cough, Grade 1.   05/27/2017 Imaging   CT CHEST WITH CONTRAST IMPRESSION: 1. Similar size of partially calcified posterior right apical lung mass. 2. Similar to minimal increase in surrounding radiation fibrosis. 3. Subtle areas of mosaic attenuation are greater on the right. Likely attributed to mild ground-glass opacity. In the appropriate clinical setting, this could represent drug toxicity induced pneumonitis. 4.  Coronary artery atherosclerosis. Aortic atherosclerosis. 5. Similar right lower lobe pulmonary nodule.   05/27/2017 Adverse Reaction   Developed worsening cough and SOB again after imfinzi. Grade 2. Placed on long steroid taper.  Permanently discontinue any further imfinzi treatments.   07/29/2017 Imaging   CT chest/abd/pelvis: IMPRESSION: 1. Stable post treatment related changes of mass-like radiation fibrosis in the upper right lung with similar appearance of densely calcified mass in the apex of the right upper lobe. No finding to suggest local recurrence of disease or metastatic disease in the chest, abdomen or pelvis on today's examination. 2. 7 mm subpleural nodule in the periphery of the right lower lobe is stable and favored to be benign. Continued attention on followup studies is recommended to ensure stability. 3. Aortic atherosclerosis, in addition to left anterior descending coronary artery disease. Please note that although the presence of coronary artery calcium documents the presence of coronary artery disease, the severity of this disease and any potential stenosis cannot be assessed on this non-gated CT examination. Assessment for potential risk factor modification, dietary therapy or pharmacologic therapy may be warranted, if clinically indicated. 4. There are calcifications of the mitral valve. Echocardiographic correlation for  evaluation of potential valvular dysfunction may be warranted if clinically indicated. 5. Mild diffuse bronchial wall thickening with mild to moderate centrilobular and mild paraseptal emphysema; imaging findings suggestive of underlying COPD. 6. Tip of left subclavian Port-A-Cath is now misdirected into the subclavian vein.     CANCER STAGING: Cancer Staging Primary cancer of right upper lobe of lung ([place]) Staging form: Lung, AJCC 7th Edition - Clinical stage from 09/27/2016: Stage IIIB (T4, N3, M0) - Signed by Baird Cancer, PA-C on 09/27/2016   INTERVAL HISTORY:  Ms. Emily Pope, a 65 y.o. female, returns for routine follow-up of her right lung cancer. Pat was last seen on 03/23/2021.   Today she reports feeling well. She reports sore pain in her upper back and neck starting 3 days ago.     REVIEW OF SYSTEMS:  Review of Systems  Constitutional:  Positive for fatigue (25%). Negative for appetite change.  Respiratory:  Positive for cough and shortness of breath.   Musculoskeletal:  Positive for back pain (8/10 upper) and neck pain (8/10).  All other systems reviewed and are negative.  PAST MEDICAL/SURGICAL HISTORY:  Past Medical History:  Diagnosis Date   Anxiety    Arthritis    Chronic bronchitis (HCC)    COPD (chronic obstructive pulmonary disease) ([place])    Essential hypertension    GERD (gastroesophageal reflux disease)    Headache    History of pneumonia    Pneumonitis    Primary cancer of right upper lobe of lung (HCC)    Stage IIIb adenocarcinoma   Past Surgical History:  Procedure Laterality Date   ABDOMINAL HYSTERECTOMY     APPENDECTOMY     when had hysterectomy   CARPAL TUNNEL RELEASE Bilateral    ENDOBRONCHIAL ULTRASOUND Bilateral 08/23/2016   Procedure: ENDOBRONCHIAL ULTRASOUND;  Surgeon: Collene Gobble, MD;  Location: [facility];  Service: Cardiopulmonary;  Laterality: Bilateral;   HERNIA REPAIR  left inguinal hernia age 25   PORTACATH  PLACEMENT Left 09/17/2016   Procedure: INSERTION PORT-A-CATH LEFT SUBCLAVIAN;  Surgeon: Aviva Signs, MD;  Location: [facility];  Service: General;  Laterality: Left;   TUBAL LIGATION      SOCIAL HISTORY:  Social History   Socioeconomic History   Marital status: Divorced    Spouse name: Not on file   Number of children: Not on file   Years of education: Not on file   Highest education level: Not on file  Occupational History   Not on file  Tobacco Use   Smoking status: Former    Packs/day: 2.00    Years: 43.00    Pack years: 86.00    Types: Cigarettes    Quit date: 07/20/2016    Years since quitting: 5.2   Smokeless tobacco: Never  Vaping Use   Vaping Use: Never used  Substance and Sexual Activity   Alcohol use: No   Drug use: No   Sexual activity: Not on file  Other Topics Concern   Not on file  Social History Narrative   Not on file   Social Determinants of Health   Financial Resource Strain: Low Risk    Difficulty of Paying Living Expenses: Not hard at all  Food Insecurity: No Food Insecurity   Worried About Charity fundraiser in the Last Year: Never true   [place]obe in the Last Year: Never true  Transportation Needs: No Transportation Needs   Lack of Transportation (Medical): No   Lack of Transportation (Non-Medical): No  Physical Activity: Insufficiently Active   Days of Exercise per Week: 7 days   Minutes of Exercise per Session: 10 min  Stress: No Stress Concern Present   Feeling of Stress : Not at all  Social Connections: Moderately Isolated   Frequency of Communication with Friends and Family: More than three times a week   Frequency of Social Gatherings with Friends and Family: Once a week   Attends Religious Services: Never   Marine scientist or Organizations: No   Attends Music therapist: Never   Marital Status: Married  Human resources officer Violence: Not At Risk   Fear of Current or Ex-Partner: No   Emotionally Abused: No    Physically Abused: No   Sexually Abused: No    FAMILY HISTORY:  Family History  Problem Relation Age of Onset   Hypertension Mother    Lung cancer Mother    Hypertension Father    Diabetes Father    Hypertension Sister    Hypertension Sister    Lung cancer Brother    Colon cancer Neg Hx    Breast cancer Neg Hx     CURRENT MEDICATIONS:  Current Outpatient Medications  Medication Sig Dispense Refill   albuterol (PROVENTIL HFA;VENTOLIN HFA) 108 (90 Base) MCG/ACT inhaler Inhale 2 puffs into the lungs every 6 (six) hours as needed for wheezing or shortness of breath. 1 Inhaler 6   buPROPion (WELLBUTRIN XL) 150 MG 24 hr tablet Take 150 mg by mouth 2 (two) times daily.     docusate sodium (COLACE) 100 MG capsule Take 100 mg by mouth 2 (two) times daily.     hydrochlorothiazide (HYDRODIURIL) 12.5 MG tablet Take 12.5 mg by mouth daily.   0   levocetirizine (XYZAL) 5 MG tablet Take 5 mg by mouth daily.     lidocaine-prilocaine (EMLA) cream Apply 1 application topically as needed. 30 g 3   losartan (COZAAR)  50 MG tablet Take 50 mg by mouth daily.   0   Melatonin 10 MG TABS Take 1 tablet by mouth at bedtime.     MODERNA COVID-19 VACCINE 100 MCG/0.5ML injection      traMADol (ULTRAM) 50 MG tablet Take 1 tablet (50 mg total) by mouth every 6 (six) hours as needed. for pain 30 tablet 0   TRELEGY ELLIPTA 100-62.5-25 MCG/INH AEPB Take 1 puff by mouth daily.   0   No current facility-administered medications for this visit.    ALLERGIES:  Allergies  Allergen Reactions   Clindamycin/Lincomycin Rash   Codeine Anaphylaxis   Lincomycin Rash    Also Clindamycin as it has lincomycin in it Also Clindamycin as it has lincomycin in it   Other Rash   Penicillins Anaphylaxis    Has patient had a PCN reaction causing immediate rash, facial/tongue/throat swelling, SOB or lightheadedness with hypotension: Unknown Has patient had a PCN reaction causing severe rash involving mucus membranes or skin  necrosis: Unknown Has patient had a PCN reaction that required hospitalization: No Has patient had a PCN reaction occurring within the last 10 years: No If all of the above answers are "NO", then may proceed with Cephalosporin use. Has patient had a PCN reaction causing immediate rash, facial/tongue/throat swelling, SOB or lightheadedness with hypotension: Unknown Has patient had a PCN reaction causing severe rash involving mucus membranes or skin necrosis: Unknown Has patient had a PCN reaction that required hospitalization: No Has patient had a PCN reaction occurring within the last 10 years: No If all of the above answers are "NO", then may proceed with Cephalosporin use.    Vancomycin Itching    Scalp itching   Hydrocodone     RASH   Sulfa Antibiotics Nausea Only    PHYSICAL EXAM:  Performance status (ECOG): 1 - Symptomatic but completely ambulatory  Vitals:   10/01/21 1509  BP: (!) 141/85  Pulse: 95  Resp: (!) 22  SpO2: 98%   Wt Readings from Last 3 Encounters:  10/01/21 126 lb 8 oz (57.4 kg)  07/31/21 127 lb (57.6 kg)  06/24/21 127 lb 9.6 oz (57.9 kg)   Physical Exam Vitals reviewed.  Constitutional:      Appearance: Normal appearance.  Cardiovascular:     Rate and Rhythm: Normal rate and regular rhythm.     Pulses: Normal pulses.     Heart sounds: Normal heart sounds.  Pulmonary:     Effort: Pulmonary effort is normal.     Breath sounds: Normal breath sounds.  Neurological:     General: No focal deficit present.     Mental Status: She is alert and oriented to person, place, and time.  Psychiatric:        Mood and Affect: Mood normal.        Behavior: Behavior normal.     LABORATORY DATA:  I have reviewed the labs as listed.  CBC Latest Ref Rng & Units 09/24/2021 06/17/2021 03/13/2021  WBC 4.0 - 10.5 K/uL 7.5 8.1 5.8  Hemoglobin 12.0 - 15.0 g/dL 13.7 13.6 12.6  Hematocrit 36.0 - 46.0 % 41.0 40.6 38.1  Platelets 150 - 400 K/uL 307 329 312   CMP Latest Ref  Rng & Units 09/24/2021 06/17/2021 03/13/2021  Glucose 70 - 99 mg/dL 100(H) 90 100(H)  BUN 8 - 23 mg/dL 15 15 14   Creatinine 0.44 - 1.00 mg/dL 0.95 1.02(H) 0.83  Sodium 135 - 145 mmol/L 140 135 136  Potassium 3.5 - 5.1  mmol/L 4.6 3.9 3.8  Chloride 98 - 111 mmol/L 106 99 104  CO2 22 - 32 mmol/L 27 25 23   Calcium 8.9 - 10.3 mg/dL 9.2 8.9 8.8(L)  Total Protein 6.5 - 8.1 g/dL 7.3 7.1 7.0  Total Bilirubin 0.3 - 1.2 mg/dL 0.1(L) 0.5 0.6  Alkaline Phos 38 - 126 U/L 97 98 87  AST 15 - 41 U/L 21 19 17   ALT 0 - 44 U/L 21 20 17     DIAGNOSTIC IMAGING:  I have independently reviewed the scans and discussed with the patient. CT Chest W Contrast  Result Date: 09/28/2021 CLINICAL DATA:  65 year old female with history of non-small cell lung cancer. Follow-up study. EXAM: CT CHEST WITH CONTRAST TECHNIQUE: Multidetector CT imaging of the chest was performed during intravenous contrast administration. CONTRAST:  13mL OMNIPAQUE IOHEXOL 350 MG/ML SOLN COMPARISON:  Chest CT 03/16/2021. Multiple other more remote prior examinations. FINDINGS: Cardiovascular: Heart size is normal. There is no significant pericardial fluid, thickening or pericardial calcification. There is aortic atherosclerosis, as well as atherosclerosis of the great vessels of the mediastinum and the coronary arteries, including calcified atherosclerotic plaque in the left main, left anterior descending and left circumflex coronary arteries. Severe calcifications of the mitral valve. Mediastinum/Nodes: No pathologically enlarged mediastinal or hilar lymph nodes. Prominent amorphous soft tissue in the right perihilar region, similar to prior examinations. Esophagus is unremarkable in appearance. No axillary lymphadenopathy. Lungs/Pleura: Again noted are areas of chronic mass-like architectural distortion in the medial aspect of the right upper lobe and superior segment of the right lower lobe (as well as to a lesser extent in the medial aspect of the left  upper lobe and superior segment of the left lower lobe), with extensive chronic calcifications in the apex of the right hemithorax and chronic right-sided pleural thickening, similar to prior examinations, most compatible with areas of chronic postradiation fibrosis. Smoothly marginated 7 mm subpleural nodule in the lateral right lower lobe (axial image 109 of series 4) stable compared to numerous prior examinations, strongly favored to be benign. No other new suspicious appearing pulmonary nodules or masses are noted. No acute consolidative airspace disease. Mild diffuse bronchial wall thickening with moderate centrilobular and paraseptal emphysema. Upper Abdomen: Aortic atherosclerosis. Diffuse low attenuation throughout the visualized hepatic parenchyma, indicative of a background of severe hepatic steatosis. Musculoskeletal: There are no aggressive appearing lytic or blastic lesions noted in the visualized portions of the skeleton. IMPRESSION: 1. Chronic postradiation changes are again noted in the thorax, as above, with no findings to suggest locally recurrent disease or metastatic disease. 2. 7 mm subpleural nodule in the periphery of the right lower lobe, stable compared to several prior examinations, favored to be benign but continued attention on follow-up imaging is recommended. 3. Aortic atherosclerosis, in addition to left main and 2 vessel coronary artery disease. Please note that although the presence of coronary artery calcium documents the presence of coronary artery disease, the severity of this disease and any potential stenosis cannot be assessed on this non-gated CT examination. Assessment for potential risk factor modification, dietary therapy or pharmacologic therapy may be warranted, if clinically indicated. 4. There are severe calcifications of the mitral valve. Echocardiographic correlation for evaluation of potential valvular dysfunction may be warranted if clinically indicated. 5. Diffuse  bronchial wall thickening with moderate centrilobular and paraseptal emphysema; imaging findings suggestive of underlying COPD. 6. Severe hepatic steatosis. Aortic Atherosclerosis (ICD10-I70.0) and Emphysema (ICD10-J43.9). Electronically Signed   By: Vinnie Langton M.D.   On: 09/28/2021  13:16     ASSESSMENT:  1.  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 -Durvalumab consolidation from 01/25/2017, discontinued on 05/09/2017 secondary to pneumonitis. -PET scan on 11/20/2018 showed further reduction in activity associated with the right apical consolidation, likely treatment related, without current evidence of malignancy.  Chronically stable 7 mm nodule in the right lateral costophrenic angle without current hypermetabolic activity. -CT of the chest on 06/01/2019 which showed treatment effect with the right upper lobe with no evidence of local recurrence or residual disease. -CT chest 12/11/2019 showed no changes in right apical consolidation.  A new 3 mm left upper lobe nodule is new since the prior exam.  If new left upper lobe nodule has increased in size this would warrant a PET CT followed by possible biopsy if FDG avid. -CT of the chest done on 06/09/2020 showed stable appearance of posttreatment changes involving the right upper lobe with compensatory hyperinflation of the right middle lobe and right lower lobe.  Stable subpleural nodules in the right lower lobe and 3 mm left upper lobe lung nodule.  Previously noted 5 mm  peribronchovascular nodule now measures 3 mm and is likely postinflammatory. -CT chest on 01/22/2021 at [facility] showed new bilateral lower lobe airspace opacities concerning for pneumonia.  Trace bilateral effusions.  No evidence of  pulmonary embolism.  Postradiation changes in the right upper lobe, unchanged.   2.  COPD/pneumonitis: -She is using albuterol and Trelegy.   3.  Vitamin D deficiency: -Labs done on 06/09/2020 showed vitamin D level 22.66. -She stopped taking the combination vitamin D and calcium due to constipation. -She will start taking an vitamin D3 by itself. -We will recheck her labs at her next visit.   4.  Iron deficiency anemia: -She is taking oral iron tablets daily. -Labs done on 06/09/2020 showed hemoglobin 12.4.   PLAN:  1.  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  She has coronary vessel disease by CT criteria.  Patient is asymptomatic at this time. - Reviewed labs from 09/24/2021 which shows normal LFTs and CBC. - She reports left-sided neck pain for the last few days and thinks she could have pulled a muscle.  She has tender area medial to the left scapula. - We will give her Flexeril 5 mg 3 times daily as needed. - She wants to have port discontinued.  She was told to contact Dr. Arnoldo Morale office for port removal. - RTC 6 months with repeat CT scan of the chest without contrast and labs.   2.  COPD/pneumonitis: - She is on Trelegy and albuterol.  Continue follow-ups with pulmonary.   3.  Vitamin D deficiency: - She stopped prescription strength vitamin D at this time.  She will continue over-the-counter vitamin D.   4.  Iron deficiency anemia: - Ferritin is 59% saturation is 26.  Hemoglobin is 13.7.   Orders placed this encounter:  No orders of the defined types were placed in this encounter.    Derek Jack, MD [place] [phone]   I, Thana Ates, am acting as a scribe for Dr. Derek Jack.  Kinnie Scales MD, have reviewed  the above documentation for accuracy  and completeness, and I agree with the above.

## 2021-10-01 ENCOUNTER — Encounter (HOSPITAL_COMMUNITY): Payer: Self-pay | Admitting: Hematology

## 2021-10-01 ENCOUNTER — Inpatient Hospital Stay (HOSPITAL_COMMUNITY): Payer: Medicare Other | Attending: Hematology | Admitting: Hematology

## 2021-10-01 ENCOUNTER — Other Ambulatory Visit: Payer: Self-pay

## 2021-10-01 ENCOUNTER — Other Ambulatory Visit (HOSPITAL_COMMUNITY): Payer: Self-pay

## 2021-10-01 VITALS — BP 141/85 | HR 95 | Resp 22 | Wt 126.5 lb

## 2021-10-01 DIAGNOSIS — Z79899 Other long term (current) drug therapy: Secondary | ICD-10-CM | POA: Insufficient documentation

## 2021-10-01 DIAGNOSIS — Z9221 Personal history of antineoplastic chemotherapy: Secondary | ICD-10-CM | POA: Insufficient documentation

## 2021-10-01 DIAGNOSIS — D509 Iron deficiency anemia, unspecified: Secondary | ICD-10-CM | POA: Insufficient documentation

## 2021-10-01 DIAGNOSIS — J440 Chronic obstructive pulmonary disease with acute lower respiratory infection: Secondary | ICD-10-CM | POA: Diagnosis not present

## 2021-10-01 DIAGNOSIS — C3411 Malignant neoplasm of upper lobe, right bronchus or lung: Secondary | ICD-10-CM

## 2021-10-01 DIAGNOSIS — Z23 Encounter for immunization: Secondary | ICD-10-CM | POA: Diagnosis not present

## 2021-10-01 DIAGNOSIS — J189 Pneumonia, unspecified organism: Secondary | ICD-10-CM | POA: Diagnosis not present

## 2021-10-01 DIAGNOSIS — Z923 Personal history of irradiation: Secondary | ICD-10-CM | POA: Insufficient documentation

## 2021-10-01 DIAGNOSIS — Z85118 Personal history of other malignant neoplasm of bronchus and lung: Secondary | ICD-10-CM | POA: Diagnosis not present

## 2021-10-01 DIAGNOSIS — Z87891 Personal history of nicotine dependence: Secondary | ICD-10-CM | POA: Diagnosis not present

## 2021-10-01 DIAGNOSIS — I10 Essential (primary) hypertension: Secondary | ICD-10-CM | POA: Insufficient documentation

## 2021-10-01 DIAGNOSIS — E559 Vitamin D deficiency, unspecified: Secondary | ICD-10-CM | POA: Diagnosis not present

## 2021-10-01 MED ORDER — INFLUENZA VAC SPLIT QUAD 0.5 ML IM SUSY
0.5000 mL | PREFILLED_SYRINGE | Freq: Once | INTRAMUSCULAR | Status: AC
  Administered 2021-10-01: 0.5 mL via INTRAMUSCULAR
  Filled 2021-10-01: qty 0.5

## 2021-10-01 MED ORDER — CYCLOBENZAPRINE HCL 5 MG PO TABS: 5.0000 mg | ORAL_TABLET | Freq: Three times a day (TID) | ORAL | 0 refills | Status: DC | PRN

## 2021-10-01 NOTE — Patient Instructions (Addendum)
[facility] Discharge Instructions  You were seen and examined today by Dr. Katragadda. Please follow up as scheduled.    Thank you for choosing [facility] at [facility] to provide your oncology and hematology care.  To afford each patient quality time with our provider, please arrive at least 15 minutes before your scheduled appointment time.   If you have a lab appointment with the Cancer Center please come in thru the Main Entrance and check in at the main information desk.  You need to re-schedule your appointment should you arrive 10 or more minutes late.  We strive to give you quality time with our providers, and arriving late affects you and other patients whose appointments are after yours.  Also, if you no show three or more times for appointments you may be dismissed from the clinic at the providers discretion.     Again, thank you for choosing [facility].  Our hope is that these requests will decrease the amount of time that you wait before being seen by our physicians.       _____________________________________________________________  Should you have questions after your visit to  Cancer Center, please contact our office at [phone] and follow the prompts.  Our office hours are 8:00 a.m. and 4:30 p.m. Monday - Friday.  Please note that voicemails left after 4:00 p.m. may not be returned until the following business day.  We are closed weekends and major holidays.  You do have access to a nurse 24-7, just call the main number to the clinic [phone] and do not press any options, hold on the line and a nurse will answer the phone.    For prescription refill requests, have your pharmacy contact our office and allow 72 hours.    Due to Covid, you will need to wear a mask upon entering the hospital. If you do not have a mask, a mask will be given to you at the Main Entrance upon arrival. For  doctor visits, patients may have 1 support person age 18 or older with them. For treatment visits, patients can not have anyone with them due to social distancing guidelines and our immunocompromised population.      

## 2021-10-01 NOTE — Progress Notes (Signed)
Flu vaccine given in L deltoid.  Tolerated well.  Site CDI.  Patient discharged to home ambulatory in stable condition.

## 2021-10-20 ENCOUNTER — Encounter: Payer: Self-pay | Admitting: General Surgery

## 2021-10-20 ENCOUNTER — Other Ambulatory Visit: Payer: Self-pay

## 2021-10-20 ENCOUNTER — Ambulatory Visit: Payer: Medicare Other | Admitting: General Surgery

## 2021-10-20 VITALS — BP 153/87 | HR 95 | Temp 98.1°F | Resp 16 | Ht 60.0 in | Wt 128.0 lb

## 2021-10-20 DIAGNOSIS — Z85118 Personal history of other malignant neoplasm of bronchus and lung: Secondary | ICD-10-CM

## 2021-10-21 NOTE — H&P (View-Only) (Signed)
Emily Pope; [id number]; [birth year]   HPI Patient is a [id number] WF who was referred by Dr. Delton Coombes for removal of her port.  She has finished with her chemotherapy. 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    Past Surgical History:  Procedure Laterality Date   ABDOMINAL HYSTERECTOMY     APPENDECTOMY     when had hysterectomy   CARPAL TUNNEL RELEASE Bilateral    ENDOBRONCHIAL ULTRASOUND Bilateral 08/23/2016   Procedure: ENDOBRONCHIAL ULTRASOUND;  Surgeon: Collene Gobble, MD;  Location: [facility];  Service: Cardiopulmonary;  Laterality: Bilateral;   HERNIA REPAIR     left inguinal hernia age 65   PORTACATH PLACEMENT Left 09/17/2016   Procedure: INSERTION PORT-A-CATH LEFT SUBCLAVIAN;  Surgeon: Aviva Signs, MD;  Location: [facility];  Service: General;  Laterality: Left;   TUBAL LIGATION      Family History  Problem Relation Age of Onset   Hypertension Mother    Lung cancer Mother    Hypertension Father    Diabetes Father    Hypertension Sister    Hypertension Sister    Lung cancer Brother    Colon cancer Neg Hx    Breast cancer Neg Hx     Current Outpatient Medications on File Prior to Visit  Medication Sig Dispense Refill   albuterol (PROVENTIL HFA;VENTOLIN HFA) 108 (90 Base) MCG/ACT inhaler Inhale 2 puffs into the lungs every 6 (six) hours as needed for wheezing or shortness of breath. 1 Inhaler 6   buPROPion (WELLBUTRIN XL) 150 MG 24 hr tablet Take 150 mg by mouth 2 (two) times daily.     docusate sodium (COLACE) 100 MG capsule Take 100 mg by mouth 2 (two) times daily.     hydrochlorothiazide (HYDRODIURIL) 12.5 MG tablet Take 12.5 mg by mouth daily.   0    levocetirizine (XYZAL) 5 MG tablet Take 5 mg by mouth daily.     losartan (COZAAR) 50 MG tablet Take 50 mg by mouth daily.   0   Melatonin 10 MG TABS Take 1 tablet by mouth at bedtime.     MODERNA COVID-19 VACCINE 100 MCG/0.5ML injection      TRELEGY ELLIPTA 100-62.5-25 MCG/INH AEPB Take 1 puff by mouth daily.   0   No current facility-administered medications on file prior to visit.    Allergies  Allergen Reactions   Clindamycin/Lincomycin Rash   Codeine Anaphylaxis   Lincomycin Rash    Also Clindamycin as it has lincomycin in it Also Clindamycin as it has lincomycin in it   Other Rash   Penicillins Anaphylaxis    Has patient had a PCN reaction causing immediate rash, facial/tongue/throat swelling, SOB or lightheadedness with hypotension: Unknown Has patient had a PCN reaction causing severe rash involving mucus membranes or skin necrosis: Unknown Has patient had a PCN reaction that required hospitalization: No Has patient had a PCN reaction occurring within the last 10 years: No If all of the above answers are "NO", then may proceed with Cephalosporin use. Has patient had a PCN reaction causing immediate rash, facial/tongue/throat swelling, SOB or lightheadedness with hypotension: Unknown Has patient had a  PCN reaction causing severe rash involving mucus membranes or skin necrosis: Unknown Has patient had a PCN reaction that required hospitalization: No Has patient had a PCN reaction occurring within the last 10 years: No If all of the above answers are "NO", then may proceed with Cephalosporin use.    Vancomycin Itching    Scalp itching   Hydrocodone     RASH   Sulfa Antibiotics Nausea Only    Social History   Substance and Sexual Activity  Alcohol Use No    Social History   Tobacco Use  Smoking Status Former   Packs/day: 2.00   Years: 43.00   Pack years: 86.00   Types: Cigarettes   Quit date: 07/20/2016   Years since quitting: 5.2  Smokeless Tobacco Never     Review of Systems  Constitutional: Negative.   HENT:  Positive for sinus pain.   Eyes: Negative.   Respiratory:  Positive for cough, shortness of breath and wheezing.   Cardiovascular: Negative.   Genitourinary:  Positive for frequency.  Musculoskeletal:  Positive for back pain, joint pain and neck pain.  Skin: Negative.   Neurological: Negative.   Endo/Heme/Allergies: Negative.   Psychiatric/Behavioral: Negative.     Objective   Vitals:   10/20/21 1429  BP: (!) 153/87  Pulse: 95  Resp: 16  Temp: 98.1 F (36.7 C)  SpO2: 95%    Physical Exam Vitals reviewed.  Constitutional:      Appearance: Normal appearance. She is normal weight. She is not ill-appearing.  HENT:     Head: Normocephalic and atraumatic.  Cardiovascular:     Rate and Rhythm: Normal rate and regular rhythm.     Heart sounds: Normal heart sounds. No murmur heard.   No friction rub. No gallop.  Pulmonary:     Effort: Pulmonary effort is normal. No respiratory distress.     Breath sounds: No stridor. Wheezing present. No rhonchi or rales.     Comments: Port in place left upper chest Skin:    General: Skin is warm and dry.  Neurological:     Mental Status: She is alert and oriented to person, place, and time.  Dr. Tomie China notes reviewed  Assessment  Right lung carcinoma, finished with chemotherapy Plan  Patient is scheduled for Port-A-Cath removal in the minor procedure room on 11/02/2021.  The risks and benefits of the procedure were fully explained to the patient, who gave informed consent.

## 2021-10-21 NOTE — Progress Notes (Signed)
Emily Pope; [id number]; [birth year]   HPI Patient is a [phone] WF who was referred by Dr. Delton Coombes for removal of her port.  She has finished with her chemotherapy. 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    Past Surgical History:  Procedure Laterality Date   ABDOMINAL HYSTERECTOMY     APPENDECTOMY     when had hysterectomy   CARPAL TUNNEL RELEASE Bilateral    ENDOBRONCHIAL ULTRASOUND Bilateral 08/23/2016   Procedure: ENDOBRONCHIAL ULTRASOUND;  Surgeon: Collene Gobble, MD;  Location: [facility];  Service: Cardiopulmonary;  Laterality: Bilateral;   HERNIA REPAIR     left inguinal hernia age 65   PORTACATH PLACEMENT Left 09/17/2016   Procedure: INSERTION PORT-A-CATH LEFT SUBCLAVIAN;  Surgeon: Aviva Signs, MD;  Location: [facility];  Service: General;  Laterality: Left;   TUBAL LIGATION      Family History  Problem Relation Age of Onset   Hypertension Mother    Lung cancer Mother    Hypertension Father    Diabetes Father    Hypertension Sister    Hypertension Sister    Lung cancer Brother    Colon cancer Neg Hx    Breast cancer Neg Hx     Current Outpatient Medications on File Prior to Visit  Medication Sig Dispense Refill   albuterol (PROVENTIL HFA;VENTOLIN HFA) 108 (90 Base) MCG/ACT inhaler Inhale 2 puffs into the lungs every 6 (six) hours as needed for wheezing or shortness of breath. 1 Inhaler 6   buPROPion (WELLBUTRIN XL) 150 MG 24 hr tablet Take 150 mg by mouth 2 (two) times daily.     docusate sodium (COLACE) 100 MG capsule Take 100 mg by mouth 2 (two) times daily.     hydrochlorothiazide (HYDRODIURIL) 12.5 MG tablet Take 12.5 mg by mouth daily.   0    levocetirizine (XYZAL) 5 MG tablet Take 5 mg by mouth daily.     losartan (COZAAR) 50 MG tablet Take 50 mg by mouth daily.   0   Melatonin 10 MG TABS Take 1 tablet by mouth at bedtime.     MODERNA COVID-19 VACCINE 100 MCG/0.5ML injection      TRELEGY ELLIPTA 100-62.5-25 MCG/INH AEPB Take 1 puff by mouth daily.   0   No current facility-administered medications on file prior to visit.    Allergies  Allergen Reactions   Clindamycin/Lincomycin Rash   Codeine Anaphylaxis   Lincomycin Rash    Also Clindamycin as it has lincomycin in it Also Clindamycin as it has lincomycin in it   Other Rash   Penicillins Anaphylaxis    Has patient had a PCN reaction causing immediate rash, facial/tongue/throat swelling, SOB or lightheadedness with hypotension: Unknown Has patient had a PCN reaction causing severe rash involving mucus membranes or skin necrosis: Unknown Has patient had a PCN reaction that required hospitalization: No Has patient had a PCN reaction occurring within the last 10 years: No If all of the above answers are "NO", then may proceed with Cephalosporin use. Has patient had a PCN reaction causing immediate rash, facial/tongue/throat swelling, SOB or lightheadedness with hypotension: Unknown Has patient had a  PCN reaction causing severe rash involving mucus membranes or skin necrosis: Unknown Has patient had a PCN reaction that required hospitalization: No Has patient had a PCN reaction occurring within the last 10 years: No If all of the above answers are "NO", then may proceed with Cephalosporin use.    Vancomycin Itching    Scalp itching   Hydrocodone     RASH   Sulfa Antibiotics Nausea Only    Social History   Substance and Sexual Activity  Alcohol Use No    Social History   Tobacco Use  Smoking Status Former   Packs/day: 2.00   Years: 43.00   Pack years: 86.00   Types: Cigarettes   Quit date: 07/20/2016   Years since quitting: 5.2  Smokeless Tobacco Never     Review of Systems  Constitutional: Negative.   HENT:  Positive for sinus pain.   Eyes: Negative.   Respiratory:  Positive for cough, shortness of breath and wheezing.   Cardiovascular: Negative.   Genitourinary:  Positive for frequency.  Musculoskeletal:  Positive for back pain, joint pain and neck pain.  Skin: Negative.   Neurological: Negative.   Endo/Heme/Allergies: Negative.   Psychiatric/Behavioral: Negative.     Objective   Vitals:   10/20/21 1429  BP: (!) 153/87  Pulse: 95  Resp: 16  Temp: 98.1 F (36.7 C)  SpO2: 95%    Physical Exam Vitals reviewed.  Constitutional:      Appearance: Normal appearance. She is normal weight. She is not ill-appearing.  HENT:     Head: Normocephalic and atraumatic.  Cardiovascular:     Rate and Rhythm: Normal rate and regular rhythm.     Heart sounds: Normal heart sounds. No murmur heard.   No friction rub. No gallop.  Pulmonary:     Effort: Pulmonary effort is normal. No respiratory distress.     Breath sounds: No stridor. Wheezing present. No rhonchi or rales.     Comments: Port in place left upper chest Skin:    General: Skin is warm and dry.  Neurological:     Mental Status: She is alert and oriented to person, place, and time.  Dr. Tomie China notes reviewed  Assessment  Right lung carcinoma, finished with chemotherapy Plan  Patient is scheduled for Port-A-Cath removal in the minor procedure room on 11/02/2021.  The risks and benefits of the procedure were fully explained to the patient, who gave informed consent.

## 2021-10-26 DIAGNOSIS — J45909 Unspecified asthma, uncomplicated: Secondary | ICD-10-CM | POA: Diagnosis not present

## 2021-10-26 DIAGNOSIS — I10 Essential (primary) hypertension: Secondary | ICD-10-CM | POA: Diagnosis not present

## 2021-11-02 ENCOUNTER — Ambulatory Visit (HOSPITAL_COMMUNITY)
Admission: RE | Admit: 2021-11-02 | Discharge: 2021-11-02 | Disposition: A | Discharge status: Home / Self Care | Payer: Medicare Other | Source: Ambulatory Visit | Attending: General Surgery | Admitting: General Surgery

## 2021-11-02 ENCOUNTER — Encounter (HOSPITAL_COMMUNITY)
Admission: RE | Disposition: A | Discharge status: Home / Self Care | Payer: Self-pay | Source: Ambulatory Visit | Attending: General Surgery

## 2021-11-02 DIAGNOSIS — Z85118 Personal history of other malignant neoplasm of bronchus and lung: Secondary | ICD-10-CM

## 2021-11-02 DIAGNOSIS — Z452 Encounter for adjustment and management of vascular access device: Secondary | ICD-10-CM | POA: Insufficient documentation

## 2021-11-02 HISTORY — PX: PORT-A-CATH REMOVAL: SHX5289

## 2021-11-02 SURGERY — MINOR REMOVAL PORT-A-CATH
Anesthesia: LOCAL | Laterality: Left

## 2021-11-02 MED ORDER — LIDOCAINE HCL (PF) 1 % IJ SOLN
INTRAMUSCULAR | Status: DC | PRN
  Administered 2021-11-02: 8 mL

## 2021-11-02 MED ORDER — LIDOCAINE HCL (PF) 1 % IJ SOLN
INTRAMUSCULAR | Status: AC
  Filled 2021-11-02: qty 30

## 2021-11-02 SURGICAL SUPPLY — 20 items
APPLICATOR CHLORAPREP 10.5 ORG (MISCELLANEOUS) ×3 IMPLANT
CLOTH BEACON ORANGE TIMEOUT ST (SAFETY) ×3 IMPLANT
DECANTER SPIKE VIAL GLASS SM (MISCELLANEOUS) ×3 IMPLANT
DERMABOND ADVANCED (GAUZE/BANDAGES/DRESSINGS) ×2
DERMABOND ADVANCED .7 DNX12 (GAUZE/BANDAGES/DRESSINGS) ×1 IMPLANT
DRAPE HALF SHEET 40X57 (DRAPES) IMPLANT
ELECT REM PT RETURN 9FT ADLT (ELECTROSURGICAL) ×3
ELECTRODE REM PT RTRN 9FT ADLT (ELECTROSURGICAL) ×1 IMPLANT
GLOVE SURG POLYISO LF SZ7.5 (GLOVE) IMPLANT
GLOVE SURG UNDER POLY LF SZ7 (GLOVE) IMPLANT
GOWN STRL REUS W/TWL LRG LVL3 (GOWN DISPOSABLE) IMPLANT
NEEDLE HYPO 25X1 1.5 SAFETY (NEEDLE) ×3 IMPLANT
PENCIL SMOKE EVACUATOR COATED (MISCELLANEOUS) IMPLANT
SPONGE GAUZE 2X2 8PLY STER LF (GAUZE/BANDAGES/DRESSINGS) ×1
SPONGE GAUZE 2X2 8PLY STRL LF (GAUZE/BANDAGES/DRESSINGS) ×2 IMPLANT
SUT MNCRL AB 4-0 PS2 18 (SUTURE) ×3 IMPLANT
SUT VIC AB 3-0 SH 27 (SUTURE) ×2
SUT VIC AB 3-0 SH 27X BRD (SUTURE) ×1 IMPLANT
SYR CONTROL 10ML LL (SYRINGE) ×3 IMPLANT
TOWEL OR 17X26 4PK STRL BLUE (TOWEL DISPOSABLE) ×3 IMPLANT

## 2021-11-02 NOTE — Interval H&P Note (Signed)
History and Physical Interval Note:  11/02/2021 7:56 AM  Emily Pope  has presented today for surgery, with the diagnosis of History of lung cancer.  The various methods of treatment have been discussed with the patient and family. After consideration of risks, benefits and other options for treatment, the patient has consented to  Procedure(s) with comments: MINOR REMOVAL PORT-A-CATH (Left) - pt to arrive at 8:30 as a surgical intervention.  The patient's history has been reviewed, patient examined, no change in status, stable for surgery.  I have reviewed the patient's chart and labs.  Questions were answered to the patient's satisfaction.     Aviva Signs

## 2021-11-02 NOTE — Op Note (Signed)
Patient:  Emily Pope  DOB:  [birth year]  MRN:  [id number]   Preop Diagnosis: History of right lung carcinoma, finished with chemotherapy  Postop Diagnosis: Same  Procedure: Port-A-Cath removal  Surgeon: Aviva Signs, MD  Anes: Local  Indications: Patient is a 65 year old white female who is finished with chemotherapy for right lung carcinoma.  The risks and benefits of the procedure were fully explained to the patient, who gave informed consent.  Procedure note: The patient was placed in the minor procedure room.  The left upper chest was prepped and draped using the usual sterile technique with ChloraPrep.  Surgical site confirmation was performed.  1% Xylocaine was used for local anesthesia.  An incision was made through the previous surgical scar down to the port.  The Port-A-Cath was removed in total without difficulty.  It was disposed of.  The tract of the catheter was closed using a figure-of-eight 3-0 Vicryl suture.  The subcutaneous layer was reapproximated using a 3-0 Vicryl interrupted suture.  The skin was closed using a 4-0 Monocryl subcuticular suture.  Dermabond was applied.  All tape and needle counts were correct at the end of the procedure.  The patient was discharged from the minor room in good and stable condition.  Complications: None  EBL: Minimal  Specimen: None

## 2021-11-04 ENCOUNTER — Encounter (HOSPITAL_COMMUNITY): Payer: Self-pay | Admitting: General Surgery

## 2021-11-16 ENCOUNTER — Other Ambulatory Visit (HOSPITAL_COMMUNITY): Payer: Self-pay | Admitting: *Deleted

## 2021-11-17 ENCOUNTER — Other Ambulatory Visit (HOSPITAL_COMMUNITY): Payer: Self-pay

## 2021-11-30 ENCOUNTER — Other Ambulatory Visit: Payer: Self-pay | Admitting: Internal Medicine

## 2021-11-30 DIAGNOSIS — Z1231 Encounter for screening mammogram for malignant neoplasm of breast: Secondary | ICD-10-CM

## 2021-12-24 ENCOUNTER — Encounter (HOSPITAL_COMMUNITY): Payer: Medicare Other

## 2022-01-05 DIAGNOSIS — C3490 Malignant neoplasm of unspecified part of unspecified bronchus or lung: Secondary | ICD-10-CM | POA: Diagnosis not present

## 2022-01-05 DIAGNOSIS — Z299 Encounter for prophylactic measures, unspecified: Secondary | ICD-10-CM | POA: Diagnosis not present

## 2022-01-05 DIAGNOSIS — J449 Chronic obstructive pulmonary disease, unspecified: Secondary | ICD-10-CM | POA: Diagnosis not present

## 2022-01-05 DIAGNOSIS — I10 Essential (primary) hypertension: Secondary | ICD-10-CM | POA: Diagnosis not present

## 2022-01-05 DIAGNOSIS — I700 Atherosclerosis of aorta: Secondary | ICD-10-CM | POA: Diagnosis not present

## 2022-02-26 ENCOUNTER — Ambulatory Visit: Payer: Medicare Other | Admitting: Pulmonary Disease

## 2022-03-18 DIAGNOSIS — J069 Acute upper respiratory infection, unspecified: Secondary | ICD-10-CM | POA: Diagnosis not present

## 2022-03-18 DIAGNOSIS — Z299 Encounter for prophylactic measures, unspecified: Secondary | ICD-10-CM | POA: Diagnosis not present

## 2022-03-18 DIAGNOSIS — J441 Chronic obstructive pulmonary disease with (acute) exacerbation: Secondary | ICD-10-CM | POA: Diagnosis not present

## 2022-03-18 DIAGNOSIS — I10 Essential (primary) hypertension: Secondary | ICD-10-CM | POA: Diagnosis not present

## 2022-03-18 DIAGNOSIS — D692 Other nonthrombocytopenic purpura: Secondary | ICD-10-CM | POA: Diagnosis not present

## 2022-03-18 DIAGNOSIS — Z6824 Body mass index (BMI) 24.0-24.9, adult: Secondary | ICD-10-CM | POA: Diagnosis not present

## 2022-04-01 ENCOUNTER — Inpatient Hospital Stay (HOSPITAL_COMMUNITY): Payer: Medicare Other | Attending: Hematology

## 2022-04-01 ENCOUNTER — Ambulatory Visit (HOSPITAL_COMMUNITY)
Admission: RE | Admit: 2022-04-01 | Discharge: 2022-04-01 | Disposition: A | Discharge status: Home / Self Care | Payer: Medicare Other | Source: Ambulatory Visit | Attending: Hematology | Admitting: Hematology

## 2022-04-01 DIAGNOSIS — C3411 Malignant neoplasm of upper lobe, right bronchus or lung: Secondary | ICD-10-CM

## 2022-04-01 DIAGNOSIS — Z85118 Personal history of other malignant neoplasm of bronchus and lung: Secondary | ICD-10-CM | POA: Diagnosis not present

## 2022-04-01 DIAGNOSIS — Z923 Personal history of irradiation: Secondary | ICD-10-CM | POA: Diagnosis not present

## 2022-04-01 DIAGNOSIS — Z87891 Personal history of nicotine dependence: Secondary | ICD-10-CM | POA: Diagnosis not present

## 2022-04-01 DIAGNOSIS — Z9221 Personal history of antineoplastic chemotherapy: Secondary | ICD-10-CM | POA: Insufficient documentation

## 2022-04-01 DIAGNOSIS — J439 Emphysema, unspecified: Secondary | ICD-10-CM | POA: Diagnosis not present

## 2022-04-01 DIAGNOSIS — Z79899 Other long term (current) drug therapy: Secondary | ICD-10-CM | POA: Diagnosis not present

## 2022-04-01 DIAGNOSIS — C3490 Malignant neoplasm of unspecified part of unspecified bronchus or lung: Secondary | ICD-10-CM | POA: Diagnosis not present

## 2022-04-01 LAB — COMPREHENSIVE METABOLIC PANEL
ALT: 18 U/L (ref 0–44)
AST: 17 U/L (ref 15–41)
Albumin: 3.9 g/dL (ref 3.5–5.0)
Alkaline Phosphatase: 85 U/L (ref 38–126)
Anion gap: 10 (ref 5–15)
BUN: 18 mg/dL (ref 8–23)
CO2: 25 mmol/L (ref 22–32)
Calcium: 9.3 mg/dL (ref 8.9–10.3)
Chloride: 105 mmol/L (ref 98–111)
Creatinine, Ser: 0.92 mg/dL (ref 0.44–1.00)
GFR, Estimated: >60 mL/min (ref >60)
Glucose, Bld: 93 mg/dL (ref 70–99)
Potassium: 5 mmol/L (ref 3.5–5.1)
Sodium: 140 mmol/L (ref 135–145)
Total Bilirubin: 0.4 mg/dL (ref 0.3–1.2)
Total Protein: 7.1 g/dL (ref 6.5–8.1)

## 2022-04-01 LAB — CBC WITH DIFFERENTIAL/PLATELET
Abs Immature Granulocytes: 0.04 10*3/uL (ref 0.00–0.07)
Basophils Absolute: 0.1 10*3/uL (ref 0.0–0.1)
Basophils Relative: 1 %
Eosinophils Absolute: 0.2 10*3/uL (ref 0.0–0.5)
Eosinophils Relative: 3 %
HCT: 38.3 % (ref 36.0–46.0)
Hemoglobin: 12.7 g/dL (ref 12.0–15.0)
Immature Granulocytes: 1 %
Lymphocytes Relative: 17 %
Lymphs Abs: 1.2 10*3/uL (ref 0.7–4.0)
MCH: 33.1 pg (ref 26.0–34.0)
MCHC: 33.2 g/dL (ref 30.0–36.0)
MCV: 99.7 fL (ref 80.0–100.0)
Monocytes Absolute: 0.5 10*3/uL (ref 0.1–1.0)
Monocytes Relative: 7 %
Neutro Abs: 5.1 10*3/uL (ref 1.7–7.7)
Neutrophils Relative %: 71 %
Platelets: 271 10*3/uL (ref 150–400)
RBC: 3.84 MIL/uL — ABNORMAL LOW (ref 3.87–5.11)
RDW: 12.6 % (ref 11.5–15.5)
WBC: 7.1 10*3/uL (ref 4.0–10.5)
nRBC: 0 % (ref 0.0–0.2)

## 2022-04-06 DIAGNOSIS — Z6824 Body mass index (BMI) 24.0-24.9, adult: Secondary | ICD-10-CM | POA: Diagnosis not present

## 2022-04-06 DIAGNOSIS — I10 Essential (primary) hypertension: Secondary | ICD-10-CM | POA: Diagnosis not present

## 2022-04-06 DIAGNOSIS — Z79899 Other long term (current) drug therapy: Secondary | ICD-10-CM | POA: Diagnosis not present

## 2022-04-06 DIAGNOSIS — E7800 Pure hypercholesterolemia, unspecified: Secondary | ICD-10-CM | POA: Diagnosis not present

## 2022-04-06 DIAGNOSIS — Z789 Other specified health status: Secondary | ICD-10-CM | POA: Diagnosis not present

## 2022-04-06 DIAGNOSIS — Z7189 Other specified counseling: Secondary | ICD-10-CM | POA: Diagnosis not present

## 2022-04-06 DIAGNOSIS — R5383 Other fatigue: Secondary | ICD-10-CM | POA: Diagnosis not present

## 2022-04-06 DIAGNOSIS — Z299 Encounter for prophylactic measures, unspecified: Secondary | ICD-10-CM | POA: Diagnosis not present

## 2022-04-06 DIAGNOSIS — Z0000 Encounter for general adult medical examination without abnormal findings: Secondary | ICD-10-CM | POA: Diagnosis not present

## 2022-04-08 ENCOUNTER — Inpatient Hospital Stay (HOSPITAL_BASED_OUTPATIENT_CLINIC_OR_DEPARTMENT_OTHER): Payer: Medicare Other | Admitting: Hematology

## 2022-04-08 ENCOUNTER — Other Ambulatory Visit (HOSPITAL_COMMUNITY): Payer: Self-pay | Admitting: *Deleted

## 2022-04-08 VITALS — BP 129/72 | HR 83 | Temp 97.9°F | Resp 18 | Ht 60.0 in | Wt 124.1 lb

## 2022-04-08 DIAGNOSIS — C3411 Malignant neoplasm of upper lobe, right bronchus or lung: Secondary | ICD-10-CM

## 2022-04-08 DIAGNOSIS — Z9221 Personal history of antineoplastic chemotherapy: Secondary | ICD-10-CM | POA: Diagnosis not present

## 2022-04-08 DIAGNOSIS — Z87891 Personal history of nicotine dependence: Secondary | ICD-10-CM | POA: Diagnosis not present

## 2022-04-08 DIAGNOSIS — Z79899 Other long term (current) drug therapy: Secondary | ICD-10-CM | POA: Diagnosis not present

## 2022-04-08 DIAGNOSIS — Z923 Personal history of irradiation: Secondary | ICD-10-CM | POA: Diagnosis not present

## 2022-04-08 DIAGNOSIS — Z85118 Personal history of other malignant neoplasm of bronchus and lung: Secondary | ICD-10-CM | POA: Diagnosis not present

## 2022-04-08 NOTE — Progress Notes (Signed)
? ?[facility] ?[address] ?[place], [place] [address] ? ? ?CLINIC:  ?Medical Oncology/Hematology ? ?PCP:  ?[name], MD ?[place] / [place] [address] ?[phone] ? ? ?REASON FOR VISIT:  ?Follow-up for right lung cancer ? ?PRIOR THERAPY:  ?1. Chemoradiation with carboplatin and paclitaxel from 09/20/2016 to 10/25/2016 with consolidation to 12/06/2016. ?2. Durvalumab consolidation from 01/25/2017 to 05/09/2017. ? ?NGS Results: not done ? ?CURRENT THERAPY: surveillance ? ?BRIEF ONCOLOGIC HISTORY:  ?Oncology History Overview Note  ?Stage IIIB (T4 N3 M0) carcinoma of RUL of lung, immunophenotyping consistent with adenocarcinoma.  S/P concomitant chemoradiation with carboplatin/paclitaxel (09/20/2016- 10/25/2016) followed by 2 cycle of consolidative chemotherapy with carboplatin/paclitaxel (11/15/2016- 12/06/2016).  Unable to tolerate consolidative durvalumab (Imfinzi) beginning on 01/26/2016 and stopped 05/2017.   ?AND ?Imfinzi-induced pneumonitis, grade 2, resulting in holding Imfinzi from 04/25/2017- 05/09/2017 with corticosteroids treatment with taper.  This resolved/improved with steroids. ?Developed pnemonitis again after rechallenge with imfinzi. On steroid taper again.  ? ?  ?Primary cancer of right upper lobe of lung ([place])  ?08/04/2016 PET scan  ? PET IMPRESSION: ?1. 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 Mass is ?confluent with the right superior hilum and right lower tracheal ?wall, suggestive of a T4 primary tumor. ?2. Postobstructive pneumonia in the right upper lobe. ?3. Hypermetabolic ipsilateral and contralateral mediastinal and ?contralateral hilar lymphadenopathy, suggestive of N3 nodal disease. ?  ?08/24/2016 Procedure  ? Bronchoscopy ?  ?08/25/2016 Pathology Results  ? Diagnosis ?Endobronchial biopsy, RUL ?- POORLY DIFFERENTIATED NON-SMALL  CELL CARCINOMA. ?The immunophenotype is consistent with poorly differentiated adenocarcinoma. ?  ?09/10/2016 Imaging  ? MRI brain- No metastatic disease or acute intracranial abnormality. ?  ?09/20/2016 - 10/25/2016 Chemotherapy  ? Carboplatin/Paclitaxel weekly x 6 with XRT. ?  ?09/20/2016 - 10/25/2016 Radiation Therapy  ?  ?  ?11/15/2016 - 12/06/2016 Chemotherapy  ? Carboplatin/paclitaxel every 21 days x 2 cycles. ?  ?12/30/2016 Imaging  ? CT CAP- 1. Central right upper lobe lung mass has mildly decreased since ?11/21/2016 chest CT angiogram and significantly decreased since ?08/04/2016 PET-CT. ?2. No pathologically enlarged thoracic lymph nodes. Previously ?described hypermetabolic thoracic adenopathy on the 08/04/2016 ?PET-CT has decreased in size. ?3. Previously described bilateral lower lobe pulmonary nodules are ?stable in size. ?4. New solitary subsolid 4 mm right middle lobe pulmonary nodule is ?nonspecific, and may be inflammatory. Recommend attention on a ?follow-up chest CT in 3 months. ?5. No definite metastatic disease in the abdomen. Tiny sub 5 mm ?low-attenuation lesions in the liver are too small to characterize, ?favor benign. ?6. Aortic atherosclerosis.  One vessel coronary atherosclerosis. ?7. Moderate emphysema. ?  ?01/25/2017 -  Chemotherapy  ? Durvalumab (Imfinzi) x 52 weeks. ? ?  ?03/31/2017 Imaging  ? CT chest with contrast: ?Stable size of partially calcified mass in the posterior right upper ?lobe. ?  ?Increased airspace disease in paramediastinal right upper and ?superior right lower lobes. Differential diagnosis includes ?radiation pneumonitis, drug reaction, and infection. ?  ?Stable sub-cm right lower lobe pulmonary nodule. ?  ?No evidence of lymphadenopathy or pleural effusion. ?  ?Emphysema. ?  ?Aortic and coronary artery atherosclerosis. ?  ?04/25/2017 Adverse Reaction  ? Progressive cough, concerning for pneumonitis. ?  ?04/25/2017 Treatment Plan Change  ? Progressive cough concerning for  pneumonitis.  Treatment held.  Managed by steroids. ?  ?05/09/2017 Treatment Plan Change  ? Restart Imfinzi with improvement  of cough, Grade 1. ?  ?05/27/2017 Imaging  ? CT CHEST WITH CONTRAST ?IMPRESSION: ?1. Similar size of partially calcified posterior right apical lung ?mass. ?2. Similar to minimal increase in surrounding radiation fibrosis. ?3. Subtle areas of mosaic attenuation are greater on the right. ?Likely attributed to mild ground-glass opacity. In the appropriate ?clinical setting, this could represent drug toxicity induced ?pneumonitis. ?4.  Coronary artery atherosclerosis. Aortic atherosclerosis. ?5. Similar right lower lobe pulmonary nodule. ?  ?05/27/2017 Adverse Reaction  ? Developed worsening cough and SOB again after imfinzi. Grade 2. Placed on long steroid taper.  ?Permanently discontinue any further imfinzi treatments. ?  ?07/29/2017 Imaging  ? CT chest/abd/pelvis: IMPRESSION: ?1. 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 No finding to ?suggest local recurrence of disease or metastatic disease in the ?chest, abdomen or pelvis on today's examination. ?2. 7 mm subpleural nodule in the periphery of the right lower lobe ?is stable and favored to be benign. Continued attention on followup ?studies is recommended to ensure stability. ?3. Aortic atherosclerosis, in addition to left anterior descending ?coronary artery disease. Please note that although the presence of ?coronary artery calcium documents the presence of coronary artery ?disease, the severity of this disease and any potential stenosis ?cannot be assessed on this non-gated CT examination. Assessment for ?potential risk factor modification, dietary therapy or pharmacologic ?therapy may be warranted, if clinically indicated. ?4. There are calcifications of the mitral valve. Echocardiographic ?correlation for  evaluation of potential valvular dysfunction may be ?warranted if clinically indicated. ?5. Mild diffuse bronchial wall thickening with mild to moderate ?centrilobular and mild paraseptal emphysema; imaging findings ?suggestive of underlying COPD. ?6. Tip of left subclavian Port-A-Cath is now misdirected into the ?subclavian vein. ?  ? ? ?CANCER STAGING: ? Cancer Staging  ?Primary cancer of right upper lobe of lung ([place]) ?Staging form: Lung, AJCC 7th Edition ?- Clinical stage from 09/27/2016: Stage IIIB (T4, N3, M0) - Signed by Baird Cancer, PA-C on 09/27/2016 ? ? ?INTERVAL HISTORY:  ?Ms. Emily Pope, a 66 y.o. female, returns for routine follow-up of her right lung cancer. Emily Pope was last seen on 10/01/2021.  ? ?Today she reports feeling good. She denies CP and SOB. Her neck pain has resolved. She continues to have back pain, and her sleep has improved. She is not taking vitamin D or B-12. She was recently prescribed Lipitor, and she has not yet started it. She denies trouble swallowing and tingling/numbness, Her appetite is good.  ? ?REVIEW OF SYSTEMS:  ?Review of Systems  ?Constitutional:  Positive for fatigue. Negative for appetite change.  ?HENT:   Negative for trouble swallowing.   ?Respiratory:  Positive for cough. Negative for shortness of breath.   ?Cardiovascular:  Negative for chest pain.  ?Musculoskeletal:  Positive for arthralgias (6/10 shoulder blades) and back pain (6/10). Negative for neck pain.  ?Neurological:  Negative for numbness.  ?Psychiatric/Behavioral:  Negative for sleep disturbance (improved).   ?All other systems reviewed and are negative. ? ?PAST MEDICAL/SURGICAL HISTORY:  ?Past Medical History:  ?Diagnosis Date  ? Anxiety   ? Arthritis   ? Chronic bronchitis ([place] [place])   ? COPD (chronic obstructive pulmonary disease) ([place])   ? Essential hypertension   ? GERD (gastroesophageal reflux disease)   ? Headache   ? History of pneumonia   ? Pneumonitis   ? Primary cancer of right upper lobe  of lung ([place])   ? Stage IIIb adenocarcinoma  ? ?Past Surgical History:  ?Procedure Laterality Date  ? ABDOMINAL HYSTERECTOMY    ? APPENDECTOMY    ? when had hysterectomy  ? CARPAL TUNNEL RELEASE [place]

## 2022-06-01 ENCOUNTER — Ambulatory Visit (INDEPENDENT_AMBULATORY_CARE_PROVIDER_SITE_OTHER): Payer: Medicare Other | Admitting: Pulmonary Disease

## 2022-06-01 ENCOUNTER — Encounter: Payer: Self-pay | Admitting: Pulmonary Disease

## 2022-06-01 VITALS — BP 116/58 | HR 72 | Temp 98.6°F | Ht 60.0 in | Wt 126.6 lb

## 2022-06-01 DIAGNOSIS — J432 Centrilobular emphysema: Secondary | ICD-10-CM | POA: Diagnosis not present

## 2022-06-01 MED ORDER — AZITHROMYCIN 500 MG PO TABS: 500.0000 mg | ORAL_TABLET | Freq: Every day | ORAL | 0 refills | Status: DC

## 2022-06-01 NOTE — Progress Notes (Signed)
[facility]  Chief Complaint  Patient presents with   Follow-up    Breathing is about the same since last visit  Trelegy inhaler.     Constitutional:  BP (!) 116/58 (BP Location: Left Arm, Patient Position: Sitting)   Pulse 72   Temp 98.6 F (37 C) (Temporal)   Ht 5' (1.524 m)   Wt 126 lb 9.6 oz (57.4 kg)   LMP 08/04/1985 (Approximate) Comment: hysterectomy @ 66 years old  SpO2 96% Comment: ra  BMI 24.72 kg/m   Past Medical History:  Anxiety, HTN, GERD, Headache, Pneumonia, NSCLC, Vit D defiency, Iron deficient anemia  Past Surgical History:  She  has a past surgical history that includes Abdominal hysterectomy; Appendectomy; Hernia repair; Tubal ligation; Endobronchial ultrasound (Bilateral, 08/23/2016); Portacath placement (Left, 09/17/2016); Carpal tunnel release (Bilateral); and Port-a-cath removal (Left, 11/02/2021).  Brief Summary:  Emily Pope is a 66 y.o. 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      Subjective:   More trouble with allergies and pollen.  Was on ABx and prednisone about 1 month ago.  Was better, but now symptoms back again. Has more cough with yellow sputum.  Using albuterol several times per week.  Not fever, hemoptysis, chest pain or wheeze.  Tried mucinex, but didn't help.  CT chest from 04/01/22 was stable.  Physical Exam:   Appearance - well kempt   ENMT - no sinus tenderness, no oral exudate, no LAN, Mallampati 2 airway, no stridor, poor dentition  Respiratory - equal breath sounds bilaterally, no wheezing or rales  CV - s1s2 regular rate and rhythm, no murmurs  Ext - no clubbing, no edema  Skin - no rashes  Psych - normal mood and affect     Pulmonary testing:  PFT 09/16/17 >> FEV1 1.79 (82%), FEV1% 89, TLC 3.69 (82%), DLCO 55%  Chest Imaging:  CT chest 03/17/21 >> radiation fibrosis changes RUL with 3.6 x 2.8 calcified mass,  b/l paramedian radiation fibrosis, moderate centrilobular emphysema, RLL nodule 7 mm, scattered b/l lower lobe GGO  Cardiac Tests:  Echo 03/15/18 >> EF 60 to 65%, grade 1 DD  Social History:  She  reports that she quit smoking about 5 years ago. Her smoking use included cigarettes. She has a 86.00 pack-year smoking history. She has never used smokeless tobacco. She reports that she does not drink alcohol and does not use drugs.  Family History:  Her family history includes Diabetes in her father; Hypertension in her father, mother, sister, and sister; Lung cancer in her brother and mother.     Assessment/Plan:   Obstructive lung disease. - likely allergic asthma +/- COPD - defer repeat PFT testing for now - has recurrent exacerbation - will give course of zithromax 500 mg daily for 5 days - don't think she needs prednisone again at this time - continue trelegy with prn albuterol - singulair caused daytime sleepiness and elevated blood pressure  Perennial allergic rhinitis. - xyzal qhs with prn benadryl - she is intolerant of nasal steroid sprays due to smell and taste  Recurrent pneumonia. 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 Delton Coombes with Oncology  Time Spent Involved in Patient Care on Day of  Examination:  36 minutes  Follow up:   Patient Instructions  Will arrange for flutter valve  Zithromax 500 mg daily for 5 days  Follow up in 6 months  Medication List:   Allergies as of 06/01/2022       Reactions   Clindamycin/lincomycin Rash   Codeine Anaphylaxis   Lincomycin Rash   Also Clindamycin as it has lincomycin in it Also Clindamycin as it has lincomycin in it   Other Rash   Penicillins Anaphylaxis   Has patient had a PCN  reaction causing immediate rash, facial/tongue/throat swelling, SOB or lightheadedness with hypotension: Unknown Has patient had a PCN reaction causing severe rash involving mucus membranes or skin necrosis: Unknown Has patient had a PCN reaction that required hospitalization: No Has patient had a PCN reaction occurring within the last 10 years: No If all of the above answers are "NO", then may proceed with Cephalosporin use. Has patient had a PCN reaction causing immediate rash, facial/tongue/throat swelling, SOB or lightheadedness with hypotension: Unknown Has patient had a PCN reaction causing severe rash involving mucus membranes or skin necrosis: Unknown Has patient had a PCN reaction that required hospitalization: No Has patient had a PCN reaction occurring within the last 10 years: No If all of the above answers are "NO", then may proceed with Cephalosporin use.   Vancomycin Itching   Scalp itching   Sulfa Antibiotics Nausea Only   Hydrocodone Rash        Medication List        Accurate as of June 01, 2022 11:23 AM. If you have any questions, ask your nurse or doctor.          albuterol 108 (90 Base) MCG/ACT inhaler Commonly known as: VENTOLIN HFA Inhale 2 puffs into the lungs every 6 (six) hours as needed for wheezing or shortness of breath.   atorvastatin 10 MG tablet Commonly known as: LIPITOR Take 10 mg by mouth daily.   azithromycin 500 MG tablet Commonly known as: ZITHROMAX Take 1 tablet (500 mg total) by mouth daily. Started by: Chesley Mires, MD   buPROPion 150 MG 24 hr tablet Commonly known as: WELLBUTRIN XL Take 150 mg by mouth 2 (two) times daily.   docusate sodium 100 MG capsule Commonly known as: COLACE Take 100 mg by mouth in the morning.   hydrochlorothiazide 12.5 MG tablet Commonly known as: HYDRODIURIL Take 12.5 mg by mouth daily as needed (if blood pressure is greater than 128/82).   levocetirizine 5 MG tablet Commonly known as: XYZAL Take  5 mg by mouth every evening.   losartan 50 MG tablet Commonly known as: COZAAR Take 50 mg by mouth daily as needed (if blood pressure is greater than 128/82).   Melatonin 10 MG Tabs Take 10 mg by mouth at bedtime.   naproxen sodium 220 MG tablet Commonly known as: ALEVE Take 220-440 mg by mouth 2 (two) times daily as needed (pain.).   Trelegy Ellipta 100-62.5-25 MCG/ACT Aepb Generic drug: Fluticasone-Umeclidin-Vilant Take 1 puff by mouth in the morning.        Signature:  Chesley Mires, MD [facility] Pager - [phone] 06/01/2022, 11:23 AM

## 2022-06-01 NOTE — Patient Instructions (Signed)
Will arrange for flutter valve  Zithromax 500 mg daily for 5 days  Follow up in 6 months

## 2022-07-13 DIAGNOSIS — Z0000 Encounter for general adult medical examination without abnormal findings: Secondary | ICD-10-CM | POA: Diagnosis not present

## 2022-07-13 DIAGNOSIS — I10 Essential (primary) hypertension: Secondary | ICD-10-CM | POA: Diagnosis not present

## 2022-07-13 DIAGNOSIS — Z87891 Personal history of nicotine dependence: Secondary | ICD-10-CM | POA: Diagnosis not present

## 2022-07-13 DIAGNOSIS — J449 Chronic obstructive pulmonary disease, unspecified: Secondary | ICD-10-CM | POA: Diagnosis not present

## 2022-07-13 DIAGNOSIS — Z299 Encounter for prophylactic measures, unspecified: Secondary | ICD-10-CM | POA: Diagnosis not present

## 2022-07-13 DIAGNOSIS — I700 Atherosclerosis of aorta: Secondary | ICD-10-CM | POA: Diagnosis not present

## 2022-08-17 DIAGNOSIS — Z789 Other specified health status: Secondary | ICD-10-CM | POA: Diagnosis not present

## 2022-08-17 DIAGNOSIS — I10 Essential (primary) hypertension: Secondary | ICD-10-CM | POA: Diagnosis not present

## 2022-08-17 DIAGNOSIS — Z299 Encounter for prophylactic measures, unspecified: Secondary | ICD-10-CM | POA: Diagnosis not present

## 2022-09-08 ENCOUNTER — Ambulatory Visit
Admission: RE | Admit: 2022-09-08 | Discharge: 2022-09-08 | Disposition: A | Discharge status: Home / Self Care | Payer: Medicare Other | Source: Ambulatory Visit | Attending: Internal Medicine | Admitting: Internal Medicine

## 2022-09-08 DIAGNOSIS — Z1231 Encounter for screening mammogram for malignant neoplasm of breast: Secondary | ICD-10-CM | POA: Diagnosis not present

## 2022-09-30 ENCOUNTER — Ambulatory Visit (HOSPITAL_COMMUNITY)
Admission: RE | Admit: 2022-09-30 | Discharge: 2022-09-30 | Disposition: A | Discharge status: Home / Self Care | Payer: Medicare Other | Source: Ambulatory Visit | Attending: Hematology | Admitting: Hematology

## 2022-09-30 ENCOUNTER — Other Ambulatory Visit (HOSPITAL_COMMUNITY): Payer: Medicare Other

## 2022-09-30 ENCOUNTER — Inpatient Hospital Stay: Payer: Medicare Other | Attending: Hematology

## 2022-09-30 DIAGNOSIS — Z923 Personal history of irradiation: Secondary | ICD-10-CM | POA: Insufficient documentation

## 2022-09-30 DIAGNOSIS — J439 Emphysema, unspecified: Secondary | ICD-10-CM | POA: Diagnosis not present

## 2022-09-30 DIAGNOSIS — Z9221 Personal history of antineoplastic chemotherapy: Secondary | ICD-10-CM | POA: Insufficient documentation

## 2022-09-30 DIAGNOSIS — D509 Iron deficiency anemia, unspecified: Secondary | ICD-10-CM | POA: Diagnosis not present

## 2022-09-30 DIAGNOSIS — C3490 Malignant neoplasm of unspecified part of unspecified bronchus or lung: Secondary | ICD-10-CM | POA: Diagnosis not present

## 2022-09-30 DIAGNOSIS — C3411 Malignant neoplasm of upper lobe, right bronchus or lung: Secondary | ICD-10-CM | POA: Insufficient documentation

## 2022-09-30 DIAGNOSIS — Z85118 Personal history of other malignant neoplasm of bronchus and lung: Secondary | ICD-10-CM | POA: Insufficient documentation

## 2022-09-30 DIAGNOSIS — E559 Vitamin D deficiency, unspecified: Secondary | ICD-10-CM | POA: Insufficient documentation

## 2022-09-30 DIAGNOSIS — R911 Solitary pulmonary nodule: Secondary | ICD-10-CM | POA: Diagnosis not present

## 2022-09-30 DIAGNOSIS — I700 Atherosclerosis of aorta: Secondary | ICD-10-CM | POA: Diagnosis not present

## 2022-09-30 LAB — COMPREHENSIVE METABOLIC PANEL
ALT: 24 U/L (ref 0–44)
AST: 23 U/L (ref 15–41)
Albumin: 4 g/dL (ref 3.5–5.0)
Alkaline Phosphatase: 83 U/L (ref 38–126)
Anion gap: 9 (ref 5–15)
BUN: 12 mg/dL (ref 8–23)
CO2: 27 mmol/L (ref 22–32)
Calcium: 9.1 mg/dL (ref 8.9–10.3)
Chloride: 104 mmol/L (ref 98–111)
Creatinine, Ser: 0.91 mg/dL (ref 0.44–1.00)
GFR, Estimated: >60 mL/min (ref >60)
Glucose, Bld: 73 mg/dL (ref 70–99)
Potassium: 4.1 mmol/L (ref 3.5–5.1)
Sodium: 140 mmol/L (ref 135–145)
Total Bilirubin: 0.4 mg/dL (ref 0.3–1.2)
Total Protein: 7.3 g/dL (ref 6.5–8.1)

## 2022-09-30 LAB — CBC WITH DIFFERENTIAL/PLATELET
Abs Immature Granulocytes: 0.03 10*3/uL (ref 0.00–0.07)
Basophils Absolute: 0.1 10*3/uL (ref 0.0–0.1)
Basophils Relative: 1 %
Eosinophils Absolute: 0.4 10*3/uL (ref 0.0–0.5)
Eosinophils Relative: 9 %
HCT: 38.6 % (ref 36.0–46.0)
Hemoglobin: 12.8 g/dL (ref 12.0–15.0)
Immature Granulocytes: 1 %
Lymphocytes Relative: 16 %
Lymphs Abs: 0.8 10*3/uL (ref 0.7–4.0)
MCH: 33.2 pg (ref 26.0–34.0)
MCHC: 33.2 g/dL (ref 30.0–36.0)
MCV: 100.3 fL — ABNORMAL HIGH (ref 80.0–100.0)
Monocytes Absolute: 0.5 10*3/uL (ref 0.1–1.0)
Monocytes Relative: 11 %
Neutro Abs: 3.1 10*3/uL (ref 1.7–7.7)
Neutrophils Relative %: 62 %
Platelets: 305 10*3/uL (ref 150–400)
RBC: 3.85 MIL/uL — ABNORMAL LOW (ref 3.87–5.11)
RDW: 12.6 % (ref 11.5–15.5)
WBC: 4.8 10*3/uL (ref 4.0–10.5)
nRBC: 0 % (ref 0.0–0.2)

## 2022-10-07 ENCOUNTER — Inpatient Hospital Stay (HOSPITAL_BASED_OUTPATIENT_CLINIC_OR_DEPARTMENT_OTHER): Payer: Medicare Other | Admitting: Hematology

## 2022-10-07 VITALS — BP 137/83 | HR 89 | Temp 97.9°F | Resp 20 | Ht 60.0 in | Wt 129.8 lb

## 2022-10-07 DIAGNOSIS — E559 Vitamin D deficiency, unspecified: Secondary | ICD-10-CM | POA: Diagnosis not present

## 2022-10-07 DIAGNOSIS — D509 Iron deficiency anemia, unspecified: Secondary | ICD-10-CM | POA: Diagnosis not present

## 2022-10-07 DIAGNOSIS — C3411 Malignant neoplasm of upper lobe, right bronchus or lung: Secondary | ICD-10-CM

## 2022-10-07 DIAGNOSIS — Z9221 Personal history of antineoplastic chemotherapy: Secondary | ICD-10-CM | POA: Diagnosis not present

## 2022-10-07 DIAGNOSIS — Z923 Personal history of irradiation: Secondary | ICD-10-CM | POA: Diagnosis not present

## 2022-10-07 NOTE — Progress Notes (Signed)
Emily Pope, [place] [address]   CLINIC:  Medical Oncology/Hematology  PCP:  [name], MD [address] / [place] [address] [phone]   REASON FOR VISIT:  Follow-up for right lung cancer  PRIOR THERAPY:  1. Chemoradiation with carboplatin and paclitaxel from 09/20/2016 to 10/25/2016 with consolidation to 12/06/2016. 2. Durvalumab consolidation from 01/25/2017 to 05/09/2017.  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  S/P concomitant chemoradiation with carboplatin/paclitaxel (09/20/2016- 10/25/2016) followed by 2 cycle of consolidative chemotherapy with carboplatin/paclitaxel (11/15/2016- 12/06/2016).  Unable to tolerate consolidative durvalumab (Imfinzi) beginning on 01/26/2016 and stopped 05/2017.   AND Imfinzi-induced pneumonitis, grade 2, resulting in holding Imfinzi from 04/25/2017- 05/09/2017 with corticosteroids treatment with taper.  This resolved/improved with steroids. Developed pnemonitis again after rechallenge with imfinzi. On steroid taper again.     Primary cancer of right upper lobe of lung ([facility])  08/04/2016 PET scan   PET IMPRESSION: 1. 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 Mass is confluent with the right superior hilum and right lower tracheal wall, suggestive of a T4 primary tumor. 2. Postobstructive pneumonia in the right upper lobe. 3. Hypermetabolic ipsilateral and contralateral mediastinal and contralateral hilar lymphadenopathy, suggestive of N3 nodal disease.   08/24/2016 Procedure   Bronchoscopy   08/25/2016 Pathology Results   Diagnosis Endobronchial biopsy, RUL - POORLY DIFFERENTIATED NON-SMALL  CELL CARCINOMA. The immunophenotype is consistent with poorly differentiated adenocarcinoma.   09/10/2016 Imaging   MRI brain- No metastatic disease or acute intracranial abnormality.   09/20/2016 - 10/25/2016 Chemotherapy   Carboplatin/Paclitaxel weekly x 6 with XRT.   09/20/2016 - 10/25/2016 Radiation Therapy     11/15/2016 - 12/06/2016 Chemotherapy   Carboplatin/paclitaxel every 21 days x 2 cycles.   12/30/2016 Imaging   CT CAP- 1. Central right upper lobe lung mass has mildly decreased since 11/21/2016 chest CT angiogram and significantly decreased since 08/04/2016 PET-CT. 2. No pathologically enlarged thoracic lymph nodes. Previously described hypermetabolic thoracic adenopathy on the 08/04/2016 PET-CT has decreased in size. 3. Previously described bilateral lower lobe pulmonary nodules are stable in size. 4. New solitary subsolid 4 mm right middle lobe pulmonary nodule is nonspecific, and may be inflammatory. Recommend attention on a follow-up chest CT in 3 months. 5. No definite metastatic disease in the abdomen. Tiny sub 5 mm low-attenuation lesions in the liver are too small to characterize, favor benign. 6. Aortic atherosclerosis.  One vessel coronary atherosclerosis. 7. Moderate emphysema.   01/25/2017 -  Chemotherapy   Durvalumab (Imfinzi) x 52 weeks.    03/31/2017 Imaging   CT chest with contrast: Stable size of partially calcified mass in the posterior right upper lobe.   Increased airspace disease in paramediastinal right upper and superior right lower lobes. Differential diagnosis includes radiation pneumonitis, drug reaction, and infection.   Stable sub-cm right lower lobe pulmonary nodule.   No evidence of lymphadenopathy or pleural effusion.   Emphysema.   Aortic and coronary artery atherosclerosis.   04/25/2017 Adverse Reaction   Progressive cough, concerning for pneumonitis.   04/25/2017 Treatment Plan Change   Progressive cough concerning for  pneumonitis.  Treatment held.  Managed by steroids.   05/09/2017 Treatment Plan Change   Restart Imfinzi with improvement of  cough, Grade 1.   05/27/2017 Imaging   CT CHEST WITH CONTRAST IMPRESSION: 1. Similar size of partially calcified posterior right apical lung mass. 2. Similar to minimal increase in surrounding radiation fibrosis. 3. Subtle areas of mosaic attenuation are greater on the right. Likely attributed to mild ground-glass opacity. In the appropriate clinical setting, this could represent drug toxicity induced pneumonitis. 4.  Coronary artery atherosclerosis. Aortic atherosclerosis. 5. Similar right lower lobe pulmonary nodule.   05/27/2017 Adverse Reaction   Developed worsening cough and SOB again after imfinzi. Grade 2. Placed on long steroid taper.  Permanently discontinue any further imfinzi treatments.   07/29/2017 Imaging   CT chest/abd/pelvis: IMPRESSION: 1. Stable post treatment related changes of mass-like radiation fibrosis in the upper right lung with similar appearance of densely calcified mass in the apex of the right upper lobe. No finding to suggest local recurrence of disease or metastatic disease in the chest, abdomen or pelvis on today's examination. 2. 7 mm subpleural nodule in the periphery of the right lower lobe is stable and favored to be benign. Continued attention on followup studies is recommended to ensure stability. 3. Aortic atherosclerosis, in addition to left anterior descending coronary artery disease. Please note that although the presence of coronary artery calcium documents the presence of coronary artery disease, the severity of this disease and any potential stenosis cannot be assessed on this non-gated CT examination. Assessment for potential risk factor modification, dietary therapy or pharmacologic therapy may be warranted, if clinically indicated. 4. There are calcifications of the mitral valve. Echocardiographic correlation for  evaluation of potential valvular dysfunction may be warranted if clinically indicated. 5. Mild diffuse bronchial wall thickening with mild to moderate centrilobular and mild paraseptal emphysema; imaging findings suggestive of underlying COPD. 6. Tip of left subclavian Port-A-Cath is now misdirected into the subclavian vein.     CANCER STAGING:  Cancer Staging  Primary cancer of right upper lobe of lung ([facility]) Staging form: Lung, AJCC 7th Edition - Clinical stage from 09/27/2016: Stage IIIB (T4, N3, M0) - Signed by Baird Cancer, PA-C on 09/27/2016   INTERVAL HISTORY:  Ms. Emily Pope, a 66 y.o. female, seen for follow-up of right lung cancer.  Denies any chest pains.  Cough and shortness of breath has been stable.  She however reports occasional chest tightness when she wakes up in the morning.  This is not on exertion.  Energy levels are reported as 50%.  Also reports a dry throat at nighttime.  REVIEW OF SYSTEMS:  Review of Systems  Respiratory:  Positive for cough and shortness of breath.   All other systems reviewed and are negative.   PAST MEDICAL/SURGICAL HISTORY:  Past Medical History:  Diagnosis Date   Anxiety    Arthritis    Chronic bronchitis (HCC)    COPD (chronic obstructive pulmonary disease) ([facility])    Essential hypertension    GERD (gastroesophageal reflux disease)    Headache    History of pneumonia    Pneumonitis    Primary cancer of right upper lobe of lung (HCC)    Stage IIIb adenocarcinoma   Past Surgical History:  Procedure Laterality Date   ABDOMINAL HYSTERECTOMY     APPENDECTOMY     when had hysterectomy   CARPAL TUNNEL RELEASE Bilateral    ENDOBRONCHIAL ULTRASOUND Bilateral 08/23/2016   Procedure: ENDOBRONCHIAL ULTRASOUND;  Surgeon: Collene Gobble, MD;  Location: [facility];  Service: Cardiopulmonary;  Laterality: Bilateral;   HERNIA REPAIR     left  inguinal hernia age 56   PORT-A-CATH REMOVAL Left 11/02/2021   Procedure: MINOR REMOVAL  PORT-A-CATH;  Surgeon: Aviva Signs, MD;  Location: [facility];  Service: General;  Laterality: Left;  pt to arrive at 8:30   PORTACATH PLACEMENT Left 09/17/2016   Procedure: INSERTION PORT-A-CATH LEFT SUBCLAVIAN;  Surgeon: Aviva Signs, MD;  Location: [facility];  Service: General;  Laterality: Left;   TUBAL LIGATION      SOCIAL HISTORY:  Social History   Socioeconomic History   Marital status: Divorced    Spouse name: Not on file   Number of children: Not on file   Years of education: Not on file   Highest education level: Not on file  Occupational History   Not on file  Tobacco Use   Smoking status: Former    Packs/day: 2.00    Years: 43.00    Total pack years: 86.00    Types: Cigarettes    Quit date: 07/20/2016    Years since quitting: 6.2   Smokeless tobacco: Never  Vaping Use   Vaping Use: Never used  Substance and Sexual Activity   Alcohol use: No   Drug use: No   Sexual activity: Not on file  Other Topics Concern   Not on file  Social History Narrative   Not on file   Social Determinants of Health   Financial Resource Strain: Low Risk  (06/24/2021)   Overall Financial Resource Strain (CARDIA)    Difficulty of Paying Living Expenses: Not hard at all  Food Insecurity: No Food Insecurity (06/24/2021)   Hunger Vital Sign    Worried About Running Out of Food in the Last Year: Never true    [place] in the Last Year: Never true  Transportation Needs: No Transportation Needs (06/24/2021)   PRAPARE - Hydrologist (Medical): No    Lack of Transportation (Non-Medical): No  Physical Activity: Insufficiently Active (06/24/2021)   Exercise Vital Sign    Days of Exercise per Week: 7 days    Minutes of Exercise per Session: 10 min  Stress: No Stress Concern Present (06/24/2021)   [place]    Feeling of Stress : Not at all  Social Connections: Moderately Isolated (06/24/2021)    Social Connection and Isolation Panel [NHANES]    Frequency of Communication with Friends and Family: More than three times a week    Frequency of Social Gatherings with Friends and Family: Once a week    Attends Religious Services: Never    Marine scientist or Organizations: No    Attends Archivist Meetings: Never    Marital Status: Married  Human resources officer Violence: Not At Risk (06/24/2021)   Humiliation, Afraid, Rape, and Kick questionnaire    Fear of Current or Ex-Partner: No    Emotionally Abused: No    Physically Abused: No    Sexually Abused: No    FAMILY HISTORY:  Family History  Problem Relation Age of Onset   Hypertension Mother    Lung cancer Mother    Hypertension Father    Diabetes Father    Hypertension Sister    Hypertension Sister    Lung cancer Brother    Colon cancer Neg Hx    Breast cancer Neg Hx     CURRENT MEDICATIONS:  Current Outpatient Medications  Medication Sig Dispense Refill   albuterol (PROVENTIL HFA;VENTOLIN HFA) 108 (90 Base) MCG/ACT inhaler Inhale 2 puffs  into the lungs every 6 (six) hours as needed for wheezing or shortness of breath. 1 Inhaler 6   ALPRAZolam (XANAX) 0.25 MG tablet Take 0.25 mg by mouth daily as needed.     atorvastatin (LIPITOR) 10 MG tablet Take 10 mg by mouth daily.     azithromycin (ZITHROMAX) 500 MG tablet Take 1 tablet (500 mg total) by mouth daily. 5 tablet 0   buPROPion (WELLBUTRIN XL) 150 MG 24 hr tablet Take 150 mg by mouth 2 (two) times daily.     docusate sodium (COLACE) 100 MG capsule Take 100 mg by mouth in the morning.     hydrochlorothiazide (HYDRODIURIL) 12.5 MG tablet Take 12.5 mg by mouth daily as needed (if blood pressure is greater than 128/82).  0   levocetirizine (XYZAL) 5 MG tablet Take 5 mg by mouth every evening.     losartan (COZAAR) 50 MG tablet Take 50 mg by mouth daily as needed (if blood pressure is greater than 128/82).  0   Melatonin 10 MG TABS Take 10 mg by mouth at bedtime.      naproxen sodium (ALEVE) 220 MG tablet Take 220-440 mg by mouth 2 (two) times daily as needed (pain.).     TRELEGY ELLIPTA 100-62.5-25 MCG/INH AEPB Take 1 puff by mouth in the morning.  0   No current facility-administered medications for this visit.    ALLERGIES:  Allergies  Allergen Reactions   Clindamycin/Lincomycin Rash   Codeine Anaphylaxis   Lincomycin Rash    Also Clindamycin as it has lincomycin in it Also Clindamycin as it has lincomycin in it   Other Rash   Penicillins Anaphylaxis    Has patient had a PCN reaction causing immediate rash, facial/tongue/throat swelling, SOB or lightheadedness with hypotension: Unknown Has patient had a PCN reaction causing severe rash involving mucus membranes or skin necrosis: Unknown Has patient had a PCN reaction that required hospitalization: No Has patient had a PCN reaction occurring within the last 10 years: No If all of the above answers are "NO", then may proceed with Cephalosporin use. Has patient had a PCN reaction causing immediate rash, facial/tongue/throat swelling, SOB or lightheadedness with hypotension: Unknown Has patient had a PCN reaction causing severe rash involving mucus membranes or skin necrosis: Unknown Has patient had a PCN reaction that required hospitalization: No Has patient had a PCN reaction occurring within the last 10 years: No If all of the above answers are "NO", then may proceed with Cephalosporin use.    Vancomycin Itching    Scalp itching   Sulfa Antibiotics Nausea Only   Hydrocodone Rash    PHYSICAL EXAM:  Performance status (ECOG): 1 - Symptomatic but completely ambulatory  Vitals:   10/07/22 1125  BP: 137/83  Pulse: 89  Resp: 20  Temp: 97.9 F (36.6 C)  SpO2: 98%   Wt Readings from Last 3 Encounters:  10/07/22 129 lb 12.8 oz (58.9 kg)  06/01/22 126 lb 9.6 oz (57.4 kg)  04/08/22 124 lb 1.6 oz (56.3 kg)   Physical Exam Vitals reviewed.  Constitutional:      Appearance: Normal  appearance.  Cardiovascular:     Rate and Rhythm: Normal rate and regular rhythm.     Pulses: Normal pulses.     Heart sounds: Normal heart sounds.  Pulmonary:     Effort: Pulmonary effort is normal.     Breath sounds: Normal breath sounds.  Musculoskeletal:     Right lower leg: No edema.  Left lower leg: No edema.  Neurological:     General: No focal deficit present.     Mental Status: She is alert and oriented to person, place, and time.  Psychiatric:        Mood and Affect: Mood normal.        Behavior: Behavior normal.      LABORATORY DATA:  I have reviewed the labs as listed.     Latest Ref Rng & Units 09/30/2022    8:43 AM 04/01/2022   11:48 AM 09/24/2021    3:26 PM  CBC  WBC 4.0 - 10.5 K/uL 4.8  7.1  7.5   Hemoglobin 12.0 - 15.0 g/dL 12.8  12.7  13.7   Hematocrit 36.0 - 46.0 % 38.6  38.3  41.0   Platelets 150 - 400 K/uL 305  271  307       Latest Ref Rng & Units 09/30/2022    8:43 AM 04/01/2022   11:48 AM 09/24/2021    3:26 PM  CMP  Glucose 70 - 99 mg/dL 73  93  100   BUN 8 - 23 mg/dL 12  18  15    Creatinine 0.44 - 1.00 mg/dL 0.91  0.92  0.95   Sodium 135 - 145 mmol/L 140  140  140   Potassium 3.5 - 5.1 mmol/L 4.1  5.0  4.6   Chloride 98 - 111 mmol/L 104  105  106   CO2 22 - 32 mmol/L 27  25  27    Calcium 8.9 - 10.3 mg/dL 9.1  9.3  9.2   Total Protein 6.5 - 8.1 g/dL 7.3  7.1  7.3   Total Bilirubin 0.3 - 1.2 mg/dL 0.4  0.4  0.1   Alkaline Phos 38 - 126 U/L 83  85  97   AST 15 - 41 U/L 23  17  21    ALT 0 - 44 U/L 24  18  21      DIAGNOSTIC IMAGING:  I have independently reviewed the scans and discussed with the patient. CT Chest Wo Contrast  Result Date: 10/01/2022 CLINICAL DATA:  Non-small cell lung cancer restaging * Tracking Code: BO * EXAM: CT CHEST WITHOUT CONTRAST TECHNIQUE: Multidetector CT imaging of the chest was performed following the standard protocol without IV contrast. RADIATION DOSE REDUCTION: This exam was performed according to the  departmental dose-optimization program which includes automated exposure control, adjustment of the mA and/or kV according to patient size and/or use of iterative reconstruction technique. COMPARISON:  04/01/2022 FINDINGS: Cardiovascular: Coronary, aortic arch, and branch vessel atherosclerotic vascular disease. Mitral valve calcifications. Mediastinum/Nodes: Right upper lobe volume loss with rightward shift of mediastinal structures. Small paraesophageal and periaortic lymph nodes are noted in the lower thorax. Right hilum obscured by local density. Lungs/Pleura: Emphysema. Clustered calcific lesion in the right upper lobe surrounded by atelectatic lung, 3.1 by 5.3 cm on image 38 series 4 stable. There is some air bronchograms in the right upper lobe which is otherwise consolidated along with the superior segment right lower lobe and possibly cephalad portions of the right middle lobe. Lateral basal segment right lower lobe nodule 0.9 by 0.5 cm on image 111 series 4, stable by my measurements. Adjacent lesser nodularity noted on image 102 series 4. Stable band of scarring anteriorly in the left upper lobe. Stable 2 by 4 mm left upper lobe nodule on image 60 series 4. 6 by 4 mm left upper lobe nodule on image 52 series 4, stable. Upper Abdomen:  Posterior gastric diverticulum. Musculoskeletal: Unremarkable IMPRESSION: 1. Stable appearance of the right upper lobe with volume loss and rightward shift of mediastinal structures. 2. Stable clustered calcific lesion in the right upper lobe surrounded by atelectatic lung. 3. Stable nodularity in the right lower lobe and left upper lobe. 4. Coronary, aortic arch, and branch vessel atherosclerotic vascular disease. Mitral valve calcifications. 5. Posterior gastric diverticulum. 6. Emphysema. 7. Aortic atherosclerosis. Aortic Atherosclerosis (ICD10-I70.0) and Emphysema (ICD10-J43.9). Electronically Signed   By: Van Clines M.D.   On: 10/01/2022 12:54   MM 3D SCREEN  BREAST BILATERAL  Result Date: 09/09/2022 CLINICAL DATA:  Screening. EXAM: DIGITAL SCREENING BILATERAL MAMMOGRAM WITH TOMOSYNTHESIS AND CAD TECHNIQUE: Bilateral screening digital craniocaudal and mediolateral oblique mammograms were obtained. Bilateral screening digital breast tomosynthesis was performed. The images were evaluated with computer-aided detection. COMPARISON:  Previous exam(s). ACR Breast Density Category c: The breast tissue is heterogeneously dense, which may obscure small masses. FINDINGS: There are no findings suspicious for malignancy. IMPRESSION: No mammographic evidence of malignancy. A result letter of this screening mammogram will be mailed directly to the patient. RECOMMENDATION: Screening mammogram in one year. (Code:SM-B-01Y) BI-RADS CATEGORY  1: Negative. Electronically Signed   By: Marin Olp M.D.   On: 09/09/2022 16:38     ASSESSMENT:  1.  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 -Durvalumab consolidation from 01/25/2017, discontinued on 05/09/2017 secondary to pneumonitis. -PET scan on 11/20/2018 showed further reduction in activity associated with the right apical consolidation, likely treatment related, without current evidence of malignancy.  Chronically stable 7 mm nodule in the right lateral costophrenic angle without current hypermetabolic activity. -CT of the chest on 06/01/2019 which showed treatment effect with the right upper lobe with no evidence of local recurrence or residual disease. -CT chest 12/11/2019 showed no changes in right apical consolidation.  A new 3 mm left upper lobe nodule is new since the prior exam.  If new left upper lobe nodule has increased in size this would warrant a PET CT followed by  possible biopsy if FDG avid. -CT of the chest done on 06/09/2020 showed stable appearance of posttreatment changes involving the right upper lobe with compensatory hyperinflation of the right middle lobe and right lower lobe.  Stable subpleural nodules in the right lower lobe and 3 mm left upper lobe lung nodule.  Previously noted 5 mm  peribronchovascular nodule now measures 3 mm and is likely postinflammatory. -CT chest on 01/22/2021 at [facility] showed new bilateral lower lobe airspace opacities concerning for pneumonia.  Trace bilateral effusions.  No evidence of pulmonary embolism.  Postradiation changes in the right upper lobe, unchanged.   2.  COPD/pneumonitis: -She is using albuterol and Trelegy.   3.  Vitamin D deficiency: -Labs done on 06/09/2020 showed vitamin D level 22.66. -She stopped taking the combination vitamin D and calcium due to constipation. -She will start taking an vitamin D3 by itself. -We will recheck her labs at her next visit.   4.  Iron deficiency anemia: -She is taking oral iron tablets daily. -Labs done on 06/09/2020 showed hemoglobin 12.4.   PLAN:  1.  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 - Labs from 09/30/2022 shows normal LFTs and CBC. - CT chest without  contrast from 09/30/2022 did not show any evidence of recurrence or metastatic disease. - Recommend follow-up in 6 months with repeat CT scan and labs. - Patient was reported to follow-up with her cardiologist if she gets any chest pains on exertion.      Orders placed this encounter:  Orders Placed This Encounter  Procedures   CT Chest Wo Contrast      Derek Jack, MD [place] [phone]

## 2022-10-07 NOTE — Patient Instructions (Addendum)
[place] at [facility] Discharge Instructions   You were seen and examined today by Dr. Delton Coombes.  He reviewed the results of your CT scan which are normal.   He reviewed the results of your lab work which are normal/stable.   We will see you back in 6 months. We will repeat a CT scan and lab work prior.    Thank you for choosing [place] at [facility] to provide your oncology and hematology care.  To afford each patient quality time with our provider, please arrive at least 15 minutes before your scheduled appointment time.   If you have a lab appointment with the [place] please come in thru the Main Entrance and check in at the main information desk.  You need to re-schedule your appointment should you arrive 10 or more minutes late.  We strive to give you quality time with our providers, and arriving late affects you and other patients whose appointments are after yours.  Also, if you no show three or more times for appointments you may be dismissed from the clinic at the providers discretion.     Again, thank you for choosing [facility].  Our hope is that these requests will decrease the amount of time that you wait before being seen by our physicians.       _____________________________________________________________  Should you have questions after your visit to [facility], please contact our office at [phone] and follow the prompts.  Our office hours are 8:00 a.m. and 4:30 p.m. Monday - Friday.  Please note that voicemails left after 4:00 p.m. may not be returned until the following business day.  We are closed weekends and major holidays.  You do have access to a nurse 24-7, just call the main number to the clinic [phone] and do not press any options, hold on the line and a nurse will answer the phone.    For prescription refill requests, have your pharmacy contact our office and allow  72 hours.    Due to Covid, you will need to wear a mask upon entering the hospital. If you do not have a mask, a mask will be given to you at the Main Entrance upon arrival. For doctor visits, patients may have 1 support person age 65 or older with them. For treatment visits, patients can not have anyone with them due to social distancing guidelines and our immunocompromised population.

## 2022-11-23 DIAGNOSIS — J449 Chronic obstructive pulmonary disease, unspecified: Secondary | ICD-10-CM | POA: Diagnosis not present

## 2022-11-23 DIAGNOSIS — M542 Cervicalgia: Secondary | ICD-10-CM | POA: Diagnosis not present

## 2022-11-23 DIAGNOSIS — I10 Essential (primary) hypertension: Secondary | ICD-10-CM | POA: Diagnosis not present

## 2022-11-23 DIAGNOSIS — Z789 Other specified health status: Secondary | ICD-10-CM | POA: Diagnosis not present

## 2022-11-23 DIAGNOSIS — Z299 Encounter for prophylactic measures, unspecified: Secondary | ICD-10-CM | POA: Diagnosis not present

## 2022-12-09 DIAGNOSIS — M545 Low back pain, unspecified: Secondary | ICD-10-CM | POA: Diagnosis not present

## 2022-12-09 DIAGNOSIS — R319 Hematuria, unspecified: Secondary | ICD-10-CM | POA: Diagnosis not present

## 2022-12-16 DIAGNOSIS — J40 Bronchitis, not specified as acute or chronic: Secondary | ICD-10-CM | POA: Diagnosis not present

## 2022-12-17 DIAGNOSIS — Z79899 Other long term (current) drug therapy: Secondary | ICD-10-CM | POA: Diagnosis not present

## 2022-12-17 DIAGNOSIS — R0600 Dyspnea, unspecified: Secondary | ICD-10-CM | POA: Diagnosis not present

## 2022-12-17 DIAGNOSIS — Z20822 Contact with and (suspected) exposure to covid-19: Secondary | ICD-10-CM | POA: Diagnosis not present

## 2022-12-17 DIAGNOSIS — I10 Essential (primary) hypertension: Secondary | ICD-10-CM | POA: Diagnosis not present

## 2022-12-17 DIAGNOSIS — Z888 Allergy status to other drugs, medicaments and biological substances status: Secondary | ICD-10-CM | POA: Diagnosis not present

## 2022-12-17 DIAGNOSIS — Z9109 Other allergy status, other than to drugs and biological substances: Secondary | ICD-10-CM | POA: Diagnosis not present

## 2022-12-17 DIAGNOSIS — J441 Chronic obstructive pulmonary disease with (acute) exacerbation: Secondary | ICD-10-CM | POA: Diagnosis not present

## 2022-12-17 DIAGNOSIS — Z7982 Long term (current) use of aspirin: Secondary | ICD-10-CM | POA: Diagnosis not present

## 2022-12-17 DIAGNOSIS — Z885 Allergy status to narcotic agent status: Secondary | ICD-10-CM | POA: Diagnosis not present

## 2022-12-17 DIAGNOSIS — F32A Depression, unspecified: Secondary | ICD-10-CM | POA: Diagnosis not present

## 2022-12-17 DIAGNOSIS — Z87891 Personal history of nicotine dependence: Secondary | ICD-10-CM | POA: Diagnosis not present

## 2022-12-17 DIAGNOSIS — Z882 Allergy status to sulfonamides status: Secondary | ICD-10-CM | POA: Diagnosis not present

## 2022-12-17 DIAGNOSIS — Z881 Allergy status to other antibiotic agents status: Secondary | ICD-10-CM | POA: Diagnosis not present

## 2022-12-17 DIAGNOSIS — R0602 Shortness of breath: Secondary | ICD-10-CM | POA: Diagnosis not present

## 2022-12-28 DIAGNOSIS — I700 Atherosclerosis of aorta: Secondary | ICD-10-CM | POA: Diagnosis not present

## 2022-12-28 DIAGNOSIS — J441 Chronic obstructive pulmonary disease with (acute) exacerbation: Secondary | ICD-10-CM | POA: Diagnosis not present

## 2022-12-28 DIAGNOSIS — Z789 Other specified health status: Secondary | ICD-10-CM | POA: Diagnosis not present

## 2022-12-28 DIAGNOSIS — Z299 Encounter for prophylactic measures, unspecified: Secondary | ICD-10-CM | POA: Diagnosis not present

## 2022-12-28 DIAGNOSIS — Z7689 Persons encountering health services in other specified circumstances: Secondary | ICD-10-CM | POA: Diagnosis not present

## 2022-12-28 DIAGNOSIS — I10 Essential (primary) hypertension: Secondary | ICD-10-CM | POA: Diagnosis not present

## 2023-01-03 ENCOUNTER — Encounter: Payer: Self-pay | Admitting: Pulmonary Disease

## 2023-01-03 ENCOUNTER — Ambulatory Visit (INDEPENDENT_AMBULATORY_CARE_PROVIDER_SITE_OTHER): Payer: Medicare Other | Admitting: Pulmonary Disease

## 2023-01-03 VITALS — BP 132/84 | HR 78 | Temp 97.6°F | Ht 60.0 in | Wt 124.6 lb

## 2023-01-03 DIAGNOSIS — J432 Centrilobular emphysema: Secondary | ICD-10-CM | POA: Diagnosis not present

## 2023-01-03 MED ORDER — ALBUTEROL SULFATE (2.5 MG/3ML) 0.083% IN NEBU: 2.5000 mg | INHALATION_SOLUTION | Freq: Four times a day (QID) | RESPIRATORY_TRACT | 5 refills | Status: DC | PRN

## 2023-01-03 NOTE — Progress Notes (Signed)
[facility]  Chief Complaint  Patient presents with   Follow-up    Feels flutter valve hasn't helped.  Recent ED visit at [facility] for COPD exacerbation      Constitutional:  BP 132/84   Pulse 78   Temp 97.6 F (36.4 C)   Ht 5' (1.524 m)   Wt 124 lb 9.6 oz (56.5 kg)   LMP 08/04/1985 (Approximate) Comment: hysterectomy @ 67 years old  SpO2 96% Comment: ra  BMI 24.33 kg/m   Past Medical History:  Anxiety, HTN, GERD, Headache, Pneumonia, NSCLC, Vit D defiency, Iron deficient anemia  Past Surgical History:  She  has a past surgical history that includes Abdominal hysterectomy; Appendectomy; Hernia repair; Tubal ligation; Endobronchial ultrasound (Bilateral, 08/23/2016); Portacath placement (Left, 09/17/2016); Carpal tunnel release (Bilateral); and Port-a-cath removal (Left, 11/02/2021).  Brief Summary:  Emily Pope is a 67 y.o. 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      Subjective:   She went to [employer] around Christmas.  Her husband passed away recently from pneumonia and sepsis.  She was around lots of people and thinks she caught a virus.  She was treated for COPD exacerbation with pneumonia.  She is doing better now, but still feels fatigued.  Not having much cough.  Not having wheeze, chest pain, or hemoptysis.  Mouth stays dry since she had radiation therapy.  Physical Exam:   Appearance - well kempt   ENMT - no sinus tenderness, no oral exudate, no LAN, Mallampati 2 airway, no stridor  Respiratory - equal breath sounds bilaterally, no wheezing or rales  CV - s1s2 regular rate and rhythm, no murmurs  Ext - no clubbing, no edema  Skin - no rashes  Psych - normal mood and affect      Pulmonary testing:  PFT 09/16/17 >> FEV1 1.79 (82%), FEV1% 89, TLC 3.69 (82%), DLCO 55%  Chest Imaging:  CT chest 03/17/21 >> radiation fibrosis changes RUL with 3.6 x  2.8 calcified mass, b/l paramedian radiation fibrosis, moderate centrilobular emphysema, RLL nodule 7 mm, scattered b/l lower lobe GGO  Cardiac Tests:  Echo 03/15/18 >> EF 60 to 65%, grade 1 DD  Social History:  She  reports that she quit smoking about 6 years ago. Her smoking use included cigarettes. She has a 86.00 pack-year smoking history. She has never used smokeless tobacco. She reports that she does not drink alcohol and does not use drugs.  Family History:  Her family history includes Diabetes in her father; Hypertension in her father, mother, sister, and sister; Lung cancer in her brother and mother.     Assessment/Plan:   Obstructive lung disease. - likely allergic asthma with COPD and emphysema - will arrange for PFT - continue trelegy 100 one puff daily - will arrange for a nebulizer machine - prn albuterol - singulair caused daytime sleepiness and elevated blood pressure  Perennial allergic rhinitis. - xyzal qhs with prn benadryl - she is intolerant of nasal steroid sprays due to smell and taste  Recurrent pneumonia. 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 Delton Coombes with Oncology  Time Spent Involved in Patient Care on Day of Examination:  27 minutes  Follow up:  Patient Instructions  Will arrange for a home nebulizer machine  Albuterol every 6 hours as needed for cough, wheeze, chest congestion, or shortness of breath  Will arrange for pulmonary function test at [facility]  Follow up in 6 months  Medication List:   Allergies as of 01/03/2023       Reactions   Clindamycin/lincomycin Rash   Codeine Anaphylaxis   Lincomycin Rash   Also Clindamycin as it has lincomycin in it Also Clindamycin as it has lincomycin  in it   Other Rash   Penicillins Anaphylaxis   Has patient had a PCN reaction causing immediate rash, facial/tongue/throat swelling, SOB or lightheadedness with hypotension: Unknown Has patient had a PCN reaction causing severe rash involving mucus membranes or skin necrosis: Unknown Has patient had a PCN reaction that required hospitalization: No Has patient had a PCN reaction occurring within the last 10 years: No If all of the above answers are "NO", then may proceed with Cephalosporin use. Has patient had a PCN reaction causing immediate rash, facial/tongue/throat swelling, SOB or lightheadedness with hypotension: Unknown Has patient had a PCN reaction causing severe rash involving mucus membranes or skin necrosis: Unknown Has patient had a PCN reaction that required hospitalization: No Has patient had a PCN reaction occurring within the last 10 years: No If all of the above answers are "NO", then may proceed with Cephalosporin use.   Vancomycin Itching   Scalp itching   Sulfa Antibiotics Nausea Only   Hydrocodone Rash        Medication List        Accurate as of January 03, 2023  9:20 AM. If you have any questions, ask your nurse or doctor.          STOP taking these medications    azithromycin 500 MG tablet Commonly known as: ZITHROMAX Stopped by: Chesley Mires, MD       TAKE these medications    albuterol 108 (90 Base) MCG/ACT inhaler Commonly known as: VENTOLIN HFA Inhale 2 puffs into the lungs every 6 (six) hours as needed for wheezing or shortness of breath. What changed: Another medication with the same name was added. Make sure you understand how and when to take each. Changed by: Chesley Mires, MD   albuterol (2.5 MG/3ML) 0.083% nebulizer solution Commonly known as: PROVENTIL Take 3 mLs (2.5 mg total) by nebulization every 6 (six) hours as needed for wheezing or shortness of breath. What changed: You were already taking a medication with the same name, and  this prescription was added. Make sure you understand how and when to take each. Changed by: Chesley Mires, MD   ALPRAZolam 0.25 MG tablet Commonly known as: XANAX Take 0.25 mg by mouth daily as needed.   atorvastatin 10 MG tablet Commonly known as: LIPITOR Take 10 mg by mouth daily.   buPROPion 150 MG 24 hr tablet Commonly known as: WELLBUTRIN XL Take 150 mg by mouth 2 (two) times daily.   cyclobenzaprine 10 MG tablet Commonly known as: FLEXERIL Take 5-10 mg by mouth every 8 (eight) hours as needed.   docusate sodium 100 MG capsule Commonly known as: COLACE Take 100 mg by mouth in the morning.   hydrochlorothiazide 12.5 MG tablet Commonly known as: HYDRODIURIL Take 12.5 mg by mouth daily as needed (if blood pressure is greater than 128/82).   levocetirizine 5 MG tablet Commonly known as: XYZAL Take 1 tablet by mouth daily.   levocetirizine 5 MG tablet Commonly known as: XYZAL  Take 5 mg by mouth every evening.   losartan 50 MG tablet Commonly known as: COZAAR Take 50 mg by mouth daily as needed (if blood pressure is greater than 128/82).   Melatonin 10 MG Tabs Take 10 mg by mouth at bedtime.   naproxen sodium 220 MG tablet Commonly known as: ALEVE Take 220-440 mg by mouth 2 (two) times daily as needed (pain.).   Trelegy Ellipta 100-62.5-25 MCG/ACT Aepb Generic drug: Fluticasone-Umeclidin-Vilant Take 1 puff by mouth in the morning.        Signature:  Chesley Mires, MD [place] Pager - [phone] 01/03/2023, 9:20 AM

## 2023-01-03 NOTE — Patient Instructions (Signed)
Will arrange for a home nebulizer machine  Albuterol every 6 hours as needed for cough, wheeze, chest congestion, or shortness of breath  Will arrange for pulmonary function test at [facility]  Follow up in 6 months

## 2023-01-13 ENCOUNTER — Telehealth: Payer: Self-pay | Admitting: *Deleted

## 2023-01-13 DIAGNOSIS — J432 Centrilobular emphysema: Secondary | ICD-10-CM | POA: Diagnosis not present

## 2023-01-13 NOTE — Telephone Encounter (Signed)
Called and spoke to Emily Pope with [facility] and she looked in their orders and confirmed they did receive order for nebulizer. She also wanted to confirm patients insurance. Gave her insurance info and she advised me to tell patient to give them a call to get nebulizer.  Called and spoke to patient and gave her number for [facility]: [phone]. She stated she will call them. Nothing further needed at this time.

## 2023-02-22 ENCOUNTER — Ambulatory Visit (HOSPITAL_COMMUNITY)
Admission: RE | Admit: 2023-02-22 | Discharge: 2023-02-22 | Disposition: A | Discharge status: Home / Self Care | Payer: 59 | Source: Ambulatory Visit | Attending: Pulmonary Disease | Admitting: Pulmonary Disease

## 2023-02-22 DIAGNOSIS — J432 Centrilobular emphysema: Secondary | ICD-10-CM | POA: Diagnosis not present

## 2023-02-22 LAB — PULMONARY FUNCTION TEST
DL/VA % pred: 67 %
DL/VA: 2.91 ml/min/mmHg/L
DLCO unc % pred: 52 %
DLCO unc: 8.95 ml/min/mmHg
FEF 25-75 Post: 1.38 L/sec
FEF 25-75 Pre: 1.81 L/sec
FEF2575-%Change-Post: -23 %
FEF2575-%Pred-Post: 74 %
FEF2575-%Pred-Pre: 97 %
FEV1-%Change-Post: -6 %
FEV1-%Pred-Post: 90 %
FEV1-%Pred-Pre: 96 %
FEV1-Post: 1.82 L
FEV1-Pre: 1.95 L
FEV1FVC-%Change-Post: -3 %
FEV1FVC-%Pred-Pre: 104 %
FEV6-%Change-Post: -3 %
FEV6-%Pred-Post: 92 %
FEV6-%Pred-Pre: 95 %
FEV6-Post: 2.34 L
FEV6-Pre: 2.43 L
FEV6FVC-%Pred-Post: 104 %
FEV6FVC-%Pred-Pre: 104 %
FVC-%Change-Post: -3 %
FVC-%Pred-Post: 88 %
FVC-%Pred-Pre: 91 %
FVC-Post: 2.34 L
FVC-Pre: 2.43 L
Post FEV1/FVC ratio: 78 %
Post FEV6/FVC ratio: 100 %
Pre FEV1/FVC ratio: 80 %
Pre FEV6/FVC Ratio: 100 %
RV % pred: 133 %
RV: 2.54 L
TLC % pred: 108 %
TLC: 4.83 L

## 2023-02-22 MED ORDER — ALBUTEROL SULFATE (2.5 MG/3ML) 0.083% IN NEBU
2.5000 mg | INHALATION_SOLUTION | Freq: Once | RESPIRATORY_TRACT | Status: AC
  Administered 2023-02-22: 2.5 mg via RESPIRATORY_TRACT

## 2023-02-24 DIAGNOSIS — I10 Essential (primary) hypertension: Secondary | ICD-10-CM | POA: Diagnosis not present

## 2023-02-24 DIAGNOSIS — J449 Chronic obstructive pulmonary disease, unspecified: Secondary | ICD-10-CM | POA: Diagnosis not present

## 2023-02-24 DIAGNOSIS — Z87891 Personal history of nicotine dependence: Secondary | ICD-10-CM | POA: Diagnosis not present

## 2023-02-24 DIAGNOSIS — Z299 Encounter for prophylactic measures, unspecified: Secondary | ICD-10-CM | POA: Diagnosis not present

## 2023-03-31 ENCOUNTER — Encounter: Payer: Self-pay | Admitting: *Deleted

## 2023-03-31 ENCOUNTER — Ambulatory Visit (HOSPITAL_COMMUNITY)
Admission: RE | Admit: 2023-03-31 | Discharge: 2023-03-31 | Disposition: A | Discharge status: Home / Self Care | Payer: 59 | Source: Ambulatory Visit | Attending: Hematology | Admitting: Hematology

## 2023-03-31 ENCOUNTER — Inpatient Hospital Stay: Payer: 59 | Attending: Hematology

## 2023-03-31 DIAGNOSIS — C3411 Malignant neoplasm of upper lobe, right bronchus or lung: Secondary | ICD-10-CM | POA: Insufficient documentation

## 2023-03-31 DIAGNOSIS — R918 Other nonspecific abnormal finding of lung field: Secondary | ICD-10-CM | POA: Diagnosis not present

## 2023-03-31 DIAGNOSIS — I10 Essential (primary) hypertension: Secondary | ICD-10-CM | POA: Insufficient documentation

## 2023-03-31 DIAGNOSIS — Z85118 Personal history of other malignant neoplasm of bronchus and lung: Secondary | ICD-10-CM | POA: Insufficient documentation

## 2023-03-31 DIAGNOSIS — D509 Iron deficiency anemia, unspecified: Secondary | ICD-10-CM | POA: Insufficient documentation

## 2023-03-31 DIAGNOSIS — J439 Emphysema, unspecified: Secondary | ICD-10-CM | POA: Diagnosis not present

## 2023-03-31 DIAGNOSIS — Z87891 Personal history of nicotine dependence: Secondary | ICD-10-CM | POA: Insufficient documentation

## 2023-03-31 DIAGNOSIS — Z9221 Personal history of antineoplastic chemotherapy: Secondary | ICD-10-CM | POA: Insufficient documentation

## 2023-03-31 DIAGNOSIS — I2510 Atherosclerotic heart disease of native coronary artery without angina pectoris: Secondary | ICD-10-CM | POA: Insufficient documentation

## 2023-03-31 DIAGNOSIS — Z923 Personal history of irradiation: Secondary | ICD-10-CM | POA: Insufficient documentation

## 2023-03-31 DIAGNOSIS — C3490 Malignant neoplasm of unspecified part of unspecified bronchus or lung: Secondary | ICD-10-CM | POA: Diagnosis not present

## 2023-03-31 DIAGNOSIS — E559 Vitamin D deficiency, unspecified: Secondary | ICD-10-CM | POA: Insufficient documentation

## 2023-03-31 DIAGNOSIS — Z79899 Other long term (current) drug therapy: Secondary | ICD-10-CM | POA: Insufficient documentation

## 2023-03-31 LAB — CBC WITH DIFFERENTIAL/PLATELET
Abs Immature Granulocytes: 0.03 10*3/uL (ref 0.00–0.07)
Basophils Absolute: 0.1 10*3/uL (ref 0.0–0.1)
Basophils Relative: 2 %
Eosinophils Absolute: 0.2 10*3/uL (ref 0.0–0.5)
Eosinophils Relative: 4 %
HCT: 42.7 % (ref 36.0–46.0)
Hemoglobin: 14.5 g/dL (ref 12.0–15.0)
Immature Granulocytes: 1 %
Lymphocytes Relative: 23 %
Lymphs Abs: 1.3 10*3/uL (ref 0.7–4.0)
MCH: 34.3 pg — ABNORMAL HIGH (ref 26.0–34.0)
MCHC: 34 g/dL (ref 30.0–36.0)
MCV: 100.9 fL — ABNORMAL HIGH (ref 80.0–100.0)
Monocytes Absolute: 0.6 10*3/uL (ref 0.1–1.0)
Monocytes Relative: 9 %
Neutro Abs: 3.7 10*3/uL (ref 1.7–7.7)
Neutrophils Relative %: 61 %
Platelets: 321 10*3/uL (ref 150–400)
RBC: 4.23 MIL/uL (ref 3.87–5.11)
RDW: 12.3 % (ref 11.5–15.5)
WBC: 5.9 10*3/uL (ref 4.0–10.5)
nRBC: 0 % (ref 0.0–0.2)

## 2023-03-31 LAB — COMPREHENSIVE METABOLIC PANEL
ALT: 20 U/L (ref 0–44)
AST: 27 U/L (ref 15–41)
Albumin: 3.9 g/dL (ref 3.5–5.0)
Alkaline Phosphatase: 101 U/L (ref 38–126)
Anion gap: 10 (ref 5–15)
BUN: 14 mg/dL (ref 8–23)
CO2: 26 mmol/L (ref 22–32)
Calcium: 9.2 mg/dL (ref 8.9–10.3)
Chloride: 98 mmol/L (ref 98–111)
Creatinine, Ser: 0.98 mg/dL (ref 0.44–1.00)
GFR, Estimated: >60 mL/min (ref >60)
Glucose, Bld: 91 mg/dL (ref 70–99)
Potassium: 4.1 mmol/L (ref 3.5–5.1)
Sodium: 134 mmol/L — ABNORMAL LOW (ref 135–145)
Total Bilirubin: 0.4 mg/dL (ref 0.3–1.2)
Total Protein: 7.3 g/dL (ref 6.5–8.1)

## 2023-03-31 NOTE — Progress Notes (Signed)
Received call report from  [facility] for abnormal CT chest.  Report is not in EPIC and will be pushed to system.  New pulmonary nodule L upper lobe.  New consolidation in lower lobes - infection vs aspiration.  Dr. Delton Coombes aware.

## 2023-04-01 DIAGNOSIS — I10 Essential (primary) hypertension: Secondary | ICD-10-CM | POA: Diagnosis not present

## 2023-04-01 DIAGNOSIS — J449 Chronic obstructive pulmonary disease, unspecified: Secondary | ICD-10-CM | POA: Diagnosis not present

## 2023-04-01 DIAGNOSIS — Z87891 Personal history of nicotine dependence: Secondary | ICD-10-CM | POA: Diagnosis not present

## 2023-04-01 DIAGNOSIS — R0602 Shortness of breath: Secondary | ICD-10-CM | POA: Diagnosis not present

## 2023-04-01 DIAGNOSIS — R6889 Other general symptoms and signs: Secondary | ICD-10-CM | POA: Diagnosis not present

## 2023-04-01 DIAGNOSIS — F32A Depression, unspecified: Secondary | ICD-10-CM | POA: Diagnosis not present

## 2023-04-01 DIAGNOSIS — J029 Acute pharyngitis, unspecified: Secondary | ICD-10-CM | POA: Diagnosis not present

## 2023-04-01 DIAGNOSIS — Z85118 Personal history of other malignant neoplasm of bronchus and lung: Secondary | ICD-10-CM | POA: Diagnosis not present

## 2023-04-01 DIAGNOSIS — R059 Cough, unspecified: Secondary | ICD-10-CM | POA: Diagnosis not present

## 2023-04-01 DIAGNOSIS — Z7951 Long term (current) use of inhaled steroids: Secondary | ICD-10-CM | POA: Diagnosis not present

## 2023-04-01 DIAGNOSIS — Z79899 Other long term (current) drug therapy: Secondary | ICD-10-CM | POA: Diagnosis not present

## 2023-04-01 DIAGNOSIS — Z743 Need for continuous supervision: Secondary | ICD-10-CM | POA: Diagnosis not present

## 2023-04-06 NOTE — Progress Notes (Signed)
[facility] [address] [place] [address]    Clinic Day:  04/07/2023  Referring physician: Ignatius Specking, MD  Patient Care Team: Ignatius Specking, MD as PCP - General (Internal Medicine) Jena Gauss Gerrit Friends, MD as Consulting Physician (Gastroenterology)   ASSESSMENT & PLAN:   Assessment: 1.  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 -Durvalumab consolidation from 01/25/2017, discontinued on 05/09/2017 secondary to pneumonitis. -PET scan on 11/20/2018 showed further reduction in activity associated with the right apical consolidation, likely treatment related, without current evidence of malignancy.  Chronically stable 7 mm nodule in the right lateral costophrenic angle without current hypermetabolic activity. -CT of the chest on 06/01/2019 which showed treatment effect with the right upper lobe with no evidence of local recurrence or residual disease. -CT chest 12/11/2019 showed no changes in right apical consolidation.  A new 3 mm left upper lobe nodule is new since the prior exam.  If new left upper lobe nodule has increased in size this would warrant a PET CT followed by possible biopsy if FDG avid. -CT of the chest done on 06/09/2020 showed stable appearance of posttreatment changes involving the right upper lobe with compensatory hyperinflation of the right middle lobe and right lower lobe.  Stable subpleural nodules in the right lower lobe and 3 mm left upper lobe lung nodule.  Previously noted 5 mm  peribronchovascular nodule now measures 3 mm and is likely postinflammatory. -CT chest on 01/22/2021 at [facility] showed new bilateral lower lobe airspace opacities concerning for pneumonia.  Trace bilateral effusions.  No evidence  of pulmonary embolism.  Postradiation changes in the right upper lobe, unchanged.   2.  COPD/pneumonitis: -She is using albuterol and Trelegy.   3.  Vitamin D deficiency: -Labs done on 06/09/2020 showed vitamin D level 22.66. -She stopped taking the combination vitamin D and calcium due to constipation. -She will start taking an vitamin D3 by itself. -We will recheck her labs at her next visit.   4.  Iron deficiency anemia: -She is taking oral iron tablets daily. -Labs done on 06/09/2020 showed hemoglobin 12.4.   Plan: 1.  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 - She recently went to the ER on 04/01/2023 at [facility] and was treated for sinus infection. - Labs from 03/31/2023: Normal LFTs and creatinine.  CBC was grossly normal. - CT chest without contrast on 03/31/2023: New irregular solid pulmonary nodule in the left upper lobe 1.4 x 0.8 cm.  Stable bilateral paramedian postradiation changes.  New irregular consolidations of the left greater than right bilateral lower lobes. - I have recommended repeating a CT of the chest in 3 months with contrast.  Orders Placed This Encounter  Procedures   CT Chest W Contrast    Standing Status:   Future    Standing Expiration Date:   04/06/2024    Order Specific Question:   If indicated for the ordered procedure, I authorize the administration of contrast media per Radiology protocol    Answer:   Yes    Order Specific Question:   Does the patient have a contrast media/X-ray dye allergy?    Answer:   No  Order Specific Question:   Preferred imaging location?    Answer:   [facility]      I,Emily Pope,acting as a scribe for Emily Massed, MD.,have documented all relevant documentation on the behalf of Emily Massed, MD,as directed by  Emily Massed, MD while in the presence of Emily Massed,  MD.   I, Emily Massed MD, have reviewed the above documentation for accuracy and completeness, and I agree with the above.   Emily Massed, MD   4/11/20243:04 PM  CHIEF COMPLAINT:   Diagnosis: right lung cancer    Cancer Staging  Primary cancer of right upper lobe of lung Staging form: Lung, AJCC 7th Edition - Clinical stage from 09/27/2016: Stage IIIB (T4, N3, M0) - Signed by Ellouise Newer, PA-C on 09/27/2016    Prior Therapy: 1. Chemoradiation with carboplatin and paclitaxel from 09/20/2016 to 10/25/2016 with consolidation to 12/06/2016. 2. Durvalumab consolidation from 01/25/2017 to 05/09/2017.  Current Therapy:  surveillance    HISTORY OF PRESENT ILLNESS:   Oncology History Overview Note  Stage IIIB (T4 N3 M0) carcinoma of RUL of lung, immunophenotyping consistent with adenocarcinoma.  S/P concomitant chemoradiation with carboplatin/paclitaxel (09/20/2016- 10/25/2016) followed by 2 cycle of consolidative chemotherapy with carboplatin/paclitaxel (11/15/2016- 12/06/2016).  Unable to tolerate consolidative durvalumab (Imfinzi) beginning on 01/26/2016 and stopped 05/2017.   AND Imfinzi-induced pneumonitis, grade 2, resulting in holding Imfinzi from 04/25/2017- 05/09/2017 with corticosteroids treatment with taper.  This resolved/improved with steroids. Developed pnemonitis again after rechallenge with imfinzi. On steroid taper again.     Primary cancer of right upper lobe of lung  08/04/2016 PET scan   PET IMPRESSION: 1. 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 Mass is confluent with the right superior hilum and right lower tracheal wall, suggestive of a T4 primary tumor. 2. Postobstructive pneumonia in the right upper lobe. 3. Hypermetabolic ipsilateral and contralateral mediastinal and contralateral hilar lymphadenopathy,  suggestive of N3 nodal disease.   08/24/2016 Procedure   Bronchoscopy   08/25/2016 Pathology Results   Diagnosis Endobronchial biopsy, RUL - POORLY DIFFERENTIATED NON-SMALL CELL CARCINOMA. The immunophenotype is consistent with poorly differentiated adenocarcinoma.   09/10/2016 Imaging   MRI brain- No metastatic disease or acute intracranial abnormality.   09/20/2016 - 10/25/2016 Chemotherapy   Carboplatin/Paclitaxel weekly x 6 with XRT.   09/20/2016 - 10/25/2016 Radiation Therapy     11/15/2016 - 12/06/2016 Chemotherapy   Carboplatin/paclitaxel every 21 days x 2 cycles.   12/30/2016 Imaging   CT CAP- 1. Central right upper lobe lung mass has mildly decreased since 11/21/2016 chest CT angiogram and significantly decreased since 08/04/2016 PET-CT. 2. No pathologically enlarged thoracic lymph nodes. Previously described hypermetabolic thoracic adenopathy on the 08/04/2016 PET-CT has decreased in size. 3. Previously described bilateral lower lobe pulmonary nodules are stable in size. 4. New solitary subsolid 4 mm right middle lobe pulmonary nodule is nonspecific, and may be inflammatory. Recommend attention on a follow-up chest CT in 3 months. 5. No definite metastatic disease in the abdomen. Tiny sub 5 mm low-attenuation lesions in the liver are too small to characterize, favor benign. 6. Aortic atherosclerosis.  One vessel coronary atherosclerosis. 7. Moderate emphysema.   01/25/2017 -  Chemotherapy   Durvalumab (Imfinzi) x 52 weeks.    03/31/2017 Imaging   CT chest with contrast: Stable size of partially calcified mass in the posterior right upper lobe.   Increased airspace disease in paramediastinal  right upper and superior right lower lobes. Differential diagnosis includes radiation pneumonitis, drug reaction, and infection.   Stable sub-cm right lower lobe pulmonary nodule.   No evidence of lymphadenopathy or pleural effusion.   Emphysema.   Aortic and coronary  artery atherosclerosis.   04/25/2017 Adverse Reaction   Progressive cough, concerning for pneumonitis.   04/25/2017 Treatment Plan Change   Progressive cough concerning for pneumonitis.  Treatment held.  Managed by steroids.   05/09/2017 Treatment Plan Change   Restart Imfinzi with improvement of cough, Grade 1.   05/27/2017 Imaging   CT CHEST WITH CONTRAST IMPRESSION: 1. Similar size of partially calcified posterior right apical lung mass. 2. Similar to minimal increase in surrounding radiation fibrosis. 3. Subtle areas of mosaic attenuation are greater on the right. Likely attributed to mild ground-glass opacity. In the appropriate clinical setting, this could represent drug toxicity induced pneumonitis. 4.  Coronary artery atherosclerosis. Aortic atherosclerosis. 5. Similar right lower lobe pulmonary nodule.   05/27/2017 Adverse Reaction   Developed worsening cough and SOB again after imfinzi. Grade 2. Placed on long steroid taper.  Permanently discontinue any further imfinzi treatments.   07/29/2017 Imaging   CT chest/abd/pelvis: IMPRESSION: 1. Stable post treatment related changes of mass-like radiation fibrosis in the upper right lung with similar appearance of densely calcified mass in the apex of the right upper lobe. No finding to suggest local recurrence of disease or metastatic disease in the chest, abdomen or pelvis on today's examination. 2. 7 mm subpleural nodule in the periphery of the right lower lobe is stable and favored to be benign. Continued attention on followup studies is recommended to ensure stability. 3. Aortic atherosclerosis, in addition to left anterior descending coronary artery disease. Please note that although the presence of coronary artery calcium documents the presence of coronary artery disease, the severity of this disease and any potential stenosis cannot be assessed on this non-gated CT examination. Assessment for potential risk factor  modification, dietary therapy or pharmacologic therapy may be warranted, if clinically indicated. 4. There are calcifications of the mitral valve. Echocardiographic correlation for evaluation of potential valvular dysfunction may be warranted if clinically indicated. 5. Mild diffuse bronchial wall thickening with mild to moderate centrilobular and mild paraseptal emphysema; imaging findings suggestive of underlying COPD. 6. Tip of left subclavian Port-A-Cath is now misdirected into the subclavian vein.      INTERVAL HISTORY:   Emily Hashimotoatricia is a 67 y.o. female presenting to clinic today for follow up of right lung cancer. She was last seen by me on 10/07/22.  Since her last visit, she underwent restaging chest CT w/o contrast on 03/31/23 showing: new 1.4 cm irregular pulmonary nodule of LUL; stable radiation change; new irregular consolidations of left-greater-than-right bilateral lower lobes, likely due to infection or aspiration.  Of note, [facility] for COPD exacerbation. She underwent a CTA chest showing: no evidence of pulmonary artery embolus; interval development of small left pleural effusion; stable chronic changes of RUL scarring; similar 9 mm subpleural nodule at right lung base.  Today, she states that she is doing well overall. Her appetite level is at 75%. Her energy level is at 10%.  PAST MEDICAL HISTORY:   Past Medical History: Past Medical History:  Diagnosis Date   Anxiety    Arthritis    Chronic bronchitis (HCC)    COPD (chronic obstructive pulmonary disease) (HCC)    Essential hypertension    GERD (gastroesophageal reflux disease)  Headache    History of pneumonia    Pneumonitis    Primary cancer of right upper lobe of lung (HCC)    Stage IIIb adenocarcinoma    Surgical History: Past Surgical History:  Procedure Laterality Date   ABDOMINAL HYSTERECTOMY     APPENDECTOMY     when had hysterectomy   CARPAL TUNNEL  RELEASE Bilateral    ENDOBRONCHIAL ULTRASOUND Bilateral 08/23/2016   Procedure: ENDOBRONCHIAL ULTRASOUND;  Surgeon: Leslye Peer, MD;  Location: [facility];  Service: Cardiopulmonary;  Laterality: Bilateral;   HERNIA REPAIR     left inguinal hernia age 2   PORT-A-CATH REMOVAL Left 11/02/2021   Procedure: MINOR REMOVAL PORT-A-CATH;  Surgeon: Franky Macho, MD;  Location: [facility];  Service: General;  Laterality: Left;  pt to arrive at 8:30   PORTACATH PLACEMENT Left 09/17/2016   Procedure: INSERTION PORT-A-CATH LEFT SUBCLAVIAN;  Surgeon: Franky Macho, MD;  Location: [facility];  Service: General;  Laterality: Left;   TUBAL LIGATION      Social History: Social History   Socioeconomic History   Marital status: Divorced    Spouse name: Not on file   Number of children: Not on file   Years of education: Not on file   Highest education level: Not on file  Occupational History   Not on file  Tobacco Use   Smoking status: Former    Packs/day: 2.00    Years: 43.00    Additional pack years: 0.00    Total pack years: 86.00    Types: Cigarettes    Quit date: 07/20/2016    Years since quitting: 6.7   Smokeless tobacco: Never  Vaping Use   Vaping Use: Never used  Substance and Sexual Activity   Alcohol use: No   Drug use: No   Sexual activity: Not on file  Other Topics Concern   Not on file  Social History Narrative   Not on file   Social Determinants of Health   Financial Resource Strain: Low Risk  (06/24/2021)   Overall Financial Resource Strain (CARDIA)    Difficulty of Paying Living Expenses: Not hard at all  Food Insecurity: No Food Insecurity (06/24/2021)   Hunger Vital Sign    Worried About Running Out of Food in the Last Year: Never true    Ran Out of Food in the Last Year: Never true  Transportation Needs: No Transportation Needs (06/24/2021)   PRAPARE - Administrator, Civil Service (Medical): No    Lack of Transportation (Non-Medical): No  Physical Activity:  Insufficiently Active (06/24/2021)   Exercise Vital Sign    Days of Exercise per Week: 7 days    Minutes of Exercise per Session: 10 min  Stress: No Stress Concern Present (06/24/2021)   [employer] of Occupational Health - Occupational Stress Questionnaire    Feeling of Stress : Not at all  Social Connections: Moderately Isolated (06/24/2021)   Social Connection and Isolation Panel [NHANES]    Frequency of Communication with Friends and Family: More than three times a week    Frequency of Social Gatherings with Friends and Family: Once a week    Attends Religious Services: Never    Database administrator or Organizations: No    Attends Banker Meetings: Never    Marital Status: Married  Catering manager Violence: Not At Risk (06/24/2021)   Humiliation, Afraid, Rape, and Kick questionnaire    Fear of Current or Ex-Partner: No    Emotionally Abused:  No    Physically Abused: No    Sexually Abused: No    Family History: Family History  Problem Relation Age of Onset   Hypertension Mother    Lung cancer Mother    Hypertension Father    Diabetes Father    Hypertension Sister    Hypertension Sister    Lung cancer Brother    Colon cancer Neg Hx    Breast cancer Neg Hx     Current Medications:  Current Outpatient Medications:    albuterol (PROVENTIL HFA;VENTOLIN HFA) 108 (90 Base) MCG/ACT inhaler, Inhale 2 puffs into the lungs every 6 (six) hours as needed for wheezing or shortness of breath., Disp: 1 Inhaler, Rfl: 6   albuterol (PROVENTIL) (2.5 MG/3ML) 0.083% nebulizer solution, Take 3 mLs (2.5 mg total) by nebulization every 6 (six) hours as needed for wheezing or shortness of breath., Disp: 360 mL, Rfl: 5   ALPRAZolam (XANAX) 0.25 MG tablet, Take 0.25 mg by mouth daily as needed., Disp: , Rfl:    atorvastatin (LIPITOR) 10 MG tablet, Take 10 mg by mouth daily., Disp: , Rfl:    buPROPion (WELLBUTRIN XL) 150 MG 24 hr tablet, Take 150 mg by mouth 2 (two) times daily.,  Disp: , Rfl:    buPROPion (WELLBUTRIN XL) 300 MG 24 hr tablet, Take 300 mg by mouth daily., Disp: , Rfl:    cyclobenzaprine (FLEXERIL) 10 MG tablet, Take 5-10 mg by mouth every 8 (eight) hours as needed., Disp: , Rfl:    docusate sodium (COLACE) 100 MG capsule, Take 100 mg by mouth in the morning., Disp: , Rfl:    hydrochlorothiazide (HYDRODIURIL) 12.5 MG tablet, Take 12.5 mg by mouth daily as needed (if blood pressure is greater than 128/82)., Disp: , Rfl: 0   levocetirizine (XYZAL) 5 MG tablet, Take 5 mg by mouth every evening., Disp: , Rfl:    levocetirizine (XYZAL) 5 MG tablet, Take 1 tablet by mouth daily., Disp: , Rfl:    losartan (COZAAR) 50 MG tablet, Take 50 mg by mouth daily as needed (if blood pressure is greater than 128/82)., Disp: , Rfl: 0   Melatonin 10 MG TABS, Take 10 mg by mouth at bedtime., Disp: , Rfl:    naproxen sodium (ALEVE) 220 MG tablet, Take 220-440 mg by mouth 2 (two) times daily as needed (pain.)., Disp: , Rfl:    TRELEGY ELLIPTA 100-62.5-25 MCG/INH AEPB, Take 1 puff by mouth in the morning., Disp: , Rfl: 0   Allergies: Allergies  Allergen Reactions   Clindamycin/Lincomycin Rash   Codeine Anaphylaxis   Lincomycin Rash    Also Clindamycin as it has lincomycin in it Also Clindamycin as it has lincomycin in it   Other Rash   Penicillins Anaphylaxis    Has patient had a PCN reaction causing immediate rash, facial/tongue/throat swelling, SOB or lightheadedness with hypotension: Unknown Has patient had a PCN reaction causing severe rash involving mucus membranes or skin necrosis: Unknown Has patient had a PCN reaction that required hospitalization: No Has patient had a PCN reaction occurring within the last 10 years: No If all of the above answers are "NO", then may proceed with Cephalosporin use. Has patient had a PCN reaction causing immediate rash, facial/tongue/throat swelling, SOB or lightheadedness with hypotension: Unknown Has patient had a PCN reaction  causing severe rash involving mucus membranes or skin necrosis: Unknown Has patient had a PCN reaction that required hospitalization: No Has patient had a PCN reaction occurring within the last 10 years: No  If all of the above answers are "NO", then may proceed with Cephalosporin use.    Vancomycin Itching    Scalp itching   Sulfa Antibiotics Nausea Only   Hydrocodone Rash    REVIEW OF SYSTEMS:   Review of Systems  Constitutional:  Negative for chills, fatigue and fever.  HENT:   Negative for lump/mass, mouth sores, nosebleeds, sore throat and trouble swallowing.   Eyes:  Negative for eye problems.  Respiratory:  Positive for cough and shortness of breath.   Cardiovascular:  Negative for chest pain, leg swelling and palpitations.  Gastrointestinal:  Negative for abdominal pain, constipation, diarrhea, nausea and vomiting.  Genitourinary:  Negative for bladder incontinence, difficulty urinating, dysuria, frequency, hematuria and nocturia.   Musculoskeletal:  Negative for arthralgias, back pain, flank pain, myalgias and neck pain.  Skin:  Negative for itching and rash.  Neurological:  Positive for headaches. Negative for dizziness and numbness.  Hematological:  Does not bruise/bleed easily.  Psychiatric/Behavioral:  Negative for depression, sleep disturbance and suicidal ideas. The patient is nervous/anxious.   All other systems reviewed and are negative.    VITALS:   Blood pressure (!) 162/97, pulse 99, temperature 98.6 F (37 C), temperature source Oral, resp. rate 18, height 5' (1.524 m), weight 127 lb 4.8 oz (57.7 kg), last menstrual period 08/04/1985, SpO2 97 %.  Wt Readings from Last 3 Encounters:  04/07/23 127 lb 4.8 oz (57.7 kg)  01/03/23 124 lb 9.6 oz (56.5 kg)  10/07/22 129 lb 12.8 oz (58.9 kg)    Body mass index is 24.86 kg/m.  Performance status (ECOG): 1 - Symptomatic but completely ambulatory  PHYSICAL EXAM:   Physical Exam Vitals and nursing note reviewed.  Exam conducted with a chaperone present.  Constitutional:      Appearance: Normal appearance.  Cardiovascular:     Rate and Rhythm: Normal rate and regular rhythm.     Pulses: Normal pulses.     Heart sounds: Normal heart sounds.  Pulmonary:     Effort: Pulmonary effort is normal.     Breath sounds: Normal breath sounds.  Abdominal:     Palpations: Abdomen is soft. There is no hepatomegaly, splenomegaly or mass.     Tenderness: There is no abdominal tenderness.  Musculoskeletal:     Right lower leg: No edema.     Left lower leg: No edema.  Lymphadenopathy:     Cervical: No cervical adenopathy.     Right cervical: No superficial, deep or posterior cervical adenopathy.    Left cervical: No superficial, deep or posterior cervical adenopathy.     Upper Body:     Right upper body: No supraclavicular or axillary adenopathy.     Left upper body: No supraclavicular or axillary adenopathy.  Neurological:     General: No focal deficit present.     Mental Status: She is alert and oriented to person, place, and time.  Psychiatric:        Mood and Affect: Mood normal.        Behavior: Behavior normal.     LABS:      Latest Ref Rng & Units 03/31/2023    9:55 AM 09/30/2022    8:43 AM 04/01/2022   11:48 AM  CBC  WBC 4.0 - 10.5 K/uL 5.9  4.8  7.1   Hemoglobin 12.0 - 15.0 g/dL 16.1  09.6  04.5   Hematocrit 36.0 - 46.0 % 42.7  38.6  38.3   Platelets 150 - 400 K/uL 321  305  271       Latest Ref Rng & Units 03/31/2023    9:55 AM 09/30/2022    8:43 AM 04/01/2022   11:48 AM  CMP  Glucose 70 - 99 mg/dL 91  73  93   BUN 8 - 23 mg/dL 14  12  18    Creatinine 0.44 - 1.00 mg/dL 2.22  9.79  8.92   Sodium 135 - 145 mmol/L 134  140  140   Potassium 3.5 - 5.1 mmol/L 4.1  4.1  5.0   Chloride 98 - 111 mmol/L 98  104  105   CO2 22 - 32 mmol/L 26  27  25    Calcium 8.9 - 10.3 mg/dL 9.2  9.1  9.3   Total Protein 6.5 - 8.1 g/dL 7.3  7.3  7.1   Total Bilirubin 0.3 - 1.2 mg/dL 0.4  0.4  0.4   Alkaline Phos  38 - 126 U/L 101  83  85   AST 15 - 41 U/L 27  23  17    ALT 0 - 44 U/L 20  24  18       No results found for: "CEA1", "CEA" / No results found for: "CEA1", "CEA" No results found for: "PSA1" No results found for: "JJH417" No results found for: "CAN125"  No results found for: "TOTALPROTELP", "ALBUMINELP", "A1GS", "A2GS", "BETS", "BETA2SER", "GAMS", "MSPIKE", "SPEI" Lab Results  Component Value Date   TIBC 319 09/24/2021   TIBC 314 06/17/2021   TIBC 311 03/13/2021   FERRITIN 59 09/24/2021   FERRITIN 65 06/17/2021   FERRITIN 68 03/13/2021   IRONPCTSAT 26 09/24/2021   IRONPCTSAT 33 (H) 06/17/2021   IRONPCTSAT 27 03/13/2021   Lab Results  Component Value Date   LDH 167 09/24/2021   LDH 135 03/13/2021   LDH 162 12/08/2020     STUDIES:   CT Chest Wo Contrast  Result Date: 03/31/2023 CLINICAL DATA:  Non-small cell lung cancer; * Tracking Code: BO * EXAM: CT CHEST WITHOUT CONTRAST TECHNIQUE: Multidetector CT imaging of the chest was performed following the standard protocol without IV contrast. RADIATION DOSE REDUCTION: This exam was performed according to the departmental dose-optimization program which includes automated exposure control, adjustment of the mA and/or kV according to patient size and/or use of iterative reconstruction technique. COMPARISON:  Multiple priors, most recent chest CT dated December 18, 2022; chest CT dated September 30, 2022 FINDINGS: Cardiovascular: Normal heart size. No pericardial effusion. Mitral valve calcifications. Normal caliber thoracic aorta with moderate calcified plaque. Mediastinum/Nodes: Small hiatal hernia. Thyroid is unremarkable. Interval decreased size paraesophageal and left paraspinal lymph nodes when compared with December 18, 2022 exam, likely resolved reactive adenopathy. Reference paraesophageal lymph node measuring 7 mm in short axis on series 2, image 102, previously 9 mm. Reference left paraspinal lymph node measuring 6 mm in short axis on  series 2, image 113, previously 9 mm. No enlarged lymph nodes seen in the chest. Lungs/Pleura: Stable bilateral paramediastinal postradiation change. Stable calcified masslike lesion of the right lung apex, likely treated disease. New irregular solid pulmonary nodule of the left upper lobe measuring 1.4 x 0.8 cm on series 4, image 45. Stable subpleural solid pulmonary nodule of the right lower lobe measuring 8 mm on series 4, image 115. Stable irregular solid pulmonary nodule of the left upper lobe measuring 5 mm on image 41 stable irregular pulmonary nodule of the right lower lobe measuring 5 mm on image 111. New irregular consolidations of the left-greater-than-right bilateral  lower lobes. Moderate centrilobular emphysema. No pleural effusion or pneumothorax. Upper Abdomen: Hepatic steatosis.  No acute abnormality. Musculoskeletal: No chest wall mass or suspicious bone lesions identified. IMPRESSION: 1. New irregular solid pulmonary nodule of the left upper lobe measuring 1.4 x 0.8 cm, concerning for recurrent disease or metachronous primary. 2. Stable bilateral paramediastinal postradiation change. Stable calcified lesion of the right lung apex, likely treated disease. 3. New irregular consolidations of the left-greater-than-right bilateral lower lobes, likely due to infection or aspiration. 4. Mitral valve calcifications, aortic Atherosclerosis (ICD10-I70.0) and Emphysema (ICD10-J43.9). These results will be called to the ordering clinician or representative by the Radiologist Assistant, and communication documented in the PACS or [employer]. Electronically Signed   By: Allegra Lai M.D.   On: 03/31/2023 15:24

## 2023-04-07 ENCOUNTER — Inpatient Hospital Stay (HOSPITAL_BASED_OUTPATIENT_CLINIC_OR_DEPARTMENT_OTHER): Payer: 59 | Admitting: Hematology

## 2023-04-07 VITALS — BP 162/97 | HR 99 | Temp 98.6°F | Resp 18 | Ht 60.0 in | Wt 127.3 lb

## 2023-04-07 DIAGNOSIS — I2510 Atherosclerotic heart disease of native coronary artery without angina pectoris: Secondary | ICD-10-CM | POA: Diagnosis not present

## 2023-04-07 DIAGNOSIS — I10 Essential (primary) hypertension: Secondary | ICD-10-CM | POA: Diagnosis not present

## 2023-04-07 DIAGNOSIS — Z9221 Personal history of antineoplastic chemotherapy: Secondary | ICD-10-CM | POA: Diagnosis not present

## 2023-04-07 DIAGNOSIS — E559 Vitamin D deficiency, unspecified: Secondary | ICD-10-CM | POA: Diagnosis not present

## 2023-04-07 DIAGNOSIS — Z85118 Personal history of other malignant neoplasm of bronchus and lung: Secondary | ICD-10-CM | POA: Diagnosis not present

## 2023-04-07 DIAGNOSIS — Z79899 Other long term (current) drug therapy: Secondary | ICD-10-CM | POA: Diagnosis not present

## 2023-04-07 DIAGNOSIS — D509 Iron deficiency anemia, unspecified: Secondary | ICD-10-CM | POA: Diagnosis not present

## 2023-04-07 DIAGNOSIS — Z87891 Personal history of nicotine dependence: Secondary | ICD-10-CM | POA: Diagnosis not present

## 2023-04-07 DIAGNOSIS — Z923 Personal history of irradiation: Secondary | ICD-10-CM | POA: Diagnosis not present

## 2023-04-07 DIAGNOSIS — C3411 Malignant neoplasm of upper lobe, right bronchus or lung: Secondary | ICD-10-CM

## 2023-04-07 NOTE — Patient Instructions (Addendum)
[facility] at [facility] Discharge Instructions   You were seen and examined today by Dr. Ellin Saba.  He reviewed the results of your lab work which is mostly normal/stable.   He reviewed the results of your CT scan. There is a new spot in the left lung that was not seen on prior scans. We will follow up on this with a repeat scan in 3 months.   Return as scheduled.    Thank you for choosing [facility] at [facility] to provide your oncology and hematology care.  To afford each patient quality time with our provider, please arrive at least 15 minutes before your scheduled appointment time.   If you have a lab appointment with the Cancer Center please come in thru the Main Entrance and check in at the main information desk.  You need to re-schedule your appointment should you arrive 10 or more minutes late.  We strive to give you quality time with our providers, and arriving late affects you and other patients whose appointments are after yours.  Also, if you no show three or more times for appointments you may be dismissed from the clinic at the providers discretion.     Again, thank you for choosing [facility].  Our hope is that these requests will decrease the amount of time that you wait before being seen by our physicians.       _____________________________________________________________  Should you have questions after your visit to [facility], please contact our office at [phone] and follow the prompts.  Our office hours are 8:00 a.m. and 4:30 p.m. Monday - Friday.  Please note that voicemails left after 4:00 p.m. may not be returned until the following business day.  We are closed weekends and major holidays.  You do have access to a nurse 24-7, just call the main number to the clinic [phone] and do not press any options, hold on the line and a nurse will answer the phone.    For prescription  refill requests, have your pharmacy contact our office and allow 72 hours.    Due to Covid, you will need to wear a mask upon entering the hospital. If you do not have a mask, a mask will be given to you at the Main Entrance upon arrival. For doctor visits, patients may have 1 support person age 57 or older with them. For treatment visits, patients can not have anyone with them due to social distancing guidelines and our immunocompromised population.

## 2023-04-13 DIAGNOSIS — I700 Atherosclerosis of aorta: Secondary | ICD-10-CM | POA: Diagnosis not present

## 2023-04-13 DIAGNOSIS — Z6824 Body mass index (BMI) 24.0-24.9, adult: Secondary | ICD-10-CM | POA: Diagnosis not present

## 2023-04-13 DIAGNOSIS — Z7189 Other specified counseling: Secondary | ICD-10-CM | POA: Diagnosis not present

## 2023-04-13 DIAGNOSIS — Z299 Encounter for prophylactic measures, unspecified: Secondary | ICD-10-CM | POA: Diagnosis not present

## 2023-04-13 DIAGNOSIS — J449 Chronic obstructive pulmonary disease, unspecified: Secondary | ICD-10-CM | POA: Diagnosis not present

## 2023-04-13 DIAGNOSIS — R5383 Other fatigue: Secondary | ICD-10-CM | POA: Diagnosis not present

## 2023-04-13 DIAGNOSIS — Z0000 Encounter for general adult medical examination without abnormal findings: Secondary | ICD-10-CM | POA: Diagnosis not present

## 2023-04-13 DIAGNOSIS — E559 Vitamin D deficiency, unspecified: Secondary | ICD-10-CM | POA: Diagnosis not present

## 2023-04-13 DIAGNOSIS — E7800 Pure hypercholesterolemia, unspecified: Secondary | ICD-10-CM | POA: Diagnosis not present

## 2023-04-13 DIAGNOSIS — I10 Essential (primary) hypertension: Secondary | ICD-10-CM | POA: Diagnosis not present

## 2023-04-13 DIAGNOSIS — Z79899 Other long term (current) drug therapy: Secondary | ICD-10-CM | POA: Diagnosis not present

## 2023-04-13 DIAGNOSIS — D519 Vitamin B12 deficiency anemia, unspecified: Secondary | ICD-10-CM | POA: Diagnosis not present

## 2023-04-13 DIAGNOSIS — M8580 Other specified disorders of bone density and structure, unspecified site: Secondary | ICD-10-CM | POA: Diagnosis not present

## 2023-04-27 DIAGNOSIS — I10 Essential (primary) hypertension: Secondary | ICD-10-CM | POA: Diagnosis not present

## 2023-04-27 DIAGNOSIS — E785 Hyperlipidemia, unspecified: Secondary | ICD-10-CM | POA: Diagnosis not present

## 2023-04-27 DIAGNOSIS — Z299 Encounter for prophylactic measures, unspecified: Secondary | ICD-10-CM | POA: Diagnosis not present

## 2023-04-27 DIAGNOSIS — R7989 Other specified abnormal findings of blood chemistry: Secondary | ICD-10-CM | POA: Diagnosis not present

## 2023-05-04 DIAGNOSIS — M542 Cervicalgia: Secondary | ICD-10-CM | POA: Diagnosis not present

## 2023-05-04 DIAGNOSIS — R0981 Nasal congestion: Secondary | ICD-10-CM | POA: Diagnosis not present

## 2023-05-04 DIAGNOSIS — D692 Other nonthrombocytopenic purpura: Secondary | ICD-10-CM | POA: Diagnosis not present

## 2023-05-04 DIAGNOSIS — R5383 Other fatigue: Secondary | ICD-10-CM | POA: Diagnosis not present

## 2023-05-04 DIAGNOSIS — I10 Essential (primary) hypertension: Secondary | ICD-10-CM | POA: Diagnosis not present

## 2023-05-04 DIAGNOSIS — Z299 Encounter for prophylactic measures, unspecified: Secondary | ICD-10-CM | POA: Diagnosis not present

## 2023-05-14 DIAGNOSIS — I10 Essential (primary) hypertension: Secondary | ICD-10-CM | POA: Diagnosis not present

## 2023-05-14 DIAGNOSIS — Z882 Allergy status to sulfonamides status: Secondary | ICD-10-CM | POA: Diagnosis not present

## 2023-05-14 DIAGNOSIS — Z85118 Personal history of other malignant neoplasm of bronchus and lung: Secondary | ICD-10-CM | POA: Diagnosis not present

## 2023-05-14 DIAGNOSIS — J449 Chronic obstructive pulmonary disease, unspecified: Secondary | ICD-10-CM | POA: Diagnosis not present

## 2023-05-14 DIAGNOSIS — T50901A Poisoning by unspecified drugs, medicaments and biological substances, accidental (unintentional), initial encounter: Secondary | ICD-10-CM | POA: Diagnosis not present

## 2023-05-14 DIAGNOSIS — Z880 Allergy status to penicillin: Secondary | ICD-10-CM | POA: Diagnosis not present

## 2023-05-14 DIAGNOSIS — R682 Dry mouth, unspecified: Secondary | ICD-10-CM | POA: Diagnosis not present

## 2023-05-14 DIAGNOSIS — T50905A Adverse effect of unspecified drugs, medicaments and biological substances, initial encounter: Secondary | ICD-10-CM | POA: Diagnosis not present

## 2023-05-16 DIAGNOSIS — J449 Chronic obstructive pulmonary disease, unspecified: Secondary | ICD-10-CM | POA: Diagnosis not present

## 2023-05-16 DIAGNOSIS — I10 Essential (primary) hypertension: Secondary | ICD-10-CM | POA: Diagnosis not present

## 2023-05-16 DIAGNOSIS — B370 Candidal stomatitis: Secondary | ICD-10-CM | POA: Diagnosis not present

## 2023-05-16 DIAGNOSIS — R1310 Dysphagia, unspecified: Secondary | ICD-10-CM | POA: Diagnosis not present

## 2023-05-16 DIAGNOSIS — Z299 Encounter for prophylactic measures, unspecified: Secondary | ICD-10-CM | POA: Diagnosis not present

## 2023-05-16 DIAGNOSIS — M542 Cervicalgia: Secondary | ICD-10-CM | POA: Diagnosis not present

## 2023-05-17 DIAGNOSIS — R1319 Other dysphagia: Secondary | ICD-10-CM | POA: Diagnosis not present

## 2023-05-17 DIAGNOSIS — Z882 Allergy status to sulfonamides status: Secondary | ICD-10-CM | POA: Diagnosis not present

## 2023-05-17 DIAGNOSIS — R1310 Dysphagia, unspecified: Secondary | ICD-10-CM | POA: Diagnosis not present

## 2023-05-17 DIAGNOSIS — Z888 Allergy status to other drugs, medicaments and biological substances status: Secondary | ICD-10-CM | POA: Diagnosis not present

## 2023-05-17 DIAGNOSIS — Z881 Allergy status to other antibiotic agents status: Secondary | ICD-10-CM | POA: Diagnosis not present

## 2023-05-17 DIAGNOSIS — K117 Disturbances of salivary secretion: Secondary | ICD-10-CM | POA: Diagnosis not present

## 2023-05-17 DIAGNOSIS — Z79899 Other long term (current) drug therapy: Secondary | ICD-10-CM | POA: Diagnosis not present

## 2023-05-17 DIAGNOSIS — Z87891 Personal history of nicotine dependence: Secondary | ICD-10-CM | POA: Diagnosis not present

## 2023-05-17 DIAGNOSIS — Z7951 Long term (current) use of inhaled steroids: Secondary | ICD-10-CM | POA: Diagnosis not present

## 2023-05-17 DIAGNOSIS — Z880 Allergy status to penicillin: Secondary | ICD-10-CM | POA: Diagnosis not present

## 2023-05-17 DIAGNOSIS — Z885 Allergy status to narcotic agent status: Secondary | ICD-10-CM | POA: Diagnosis not present

## 2023-05-25 DIAGNOSIS — I10 Essential (primary) hypertension: Secondary | ICD-10-CM | POA: Diagnosis not present

## 2023-05-25 DIAGNOSIS — Z299 Encounter for prophylactic measures, unspecified: Secondary | ICD-10-CM | POA: Diagnosis not present

## 2023-05-25 DIAGNOSIS — Z7689 Persons encountering health services in other specified circumstances: Secondary | ICD-10-CM | POA: Diagnosis not present

## 2023-05-27 ENCOUNTER — Telehealth: Payer: Self-pay | Admitting: Pulmonary Disease

## 2023-05-27 ENCOUNTER — Ambulatory Visit (INDEPENDENT_AMBULATORY_CARE_PROVIDER_SITE_OTHER): Payer: 59 | Admitting: Pulmonary Disease

## 2023-05-27 ENCOUNTER — Encounter: Payer: Self-pay | Admitting: Pulmonary Disease

## 2023-05-27 VITALS — BP 144/82 | HR 92 | Ht 60.0 in | Wt 130.8 lb

## 2023-05-27 DIAGNOSIS — J432 Centrilobular emphysema: Secondary | ICD-10-CM | POA: Diagnosis not present

## 2023-05-27 DIAGNOSIS — J189 Pneumonia, unspecified organism: Secondary | ICD-10-CM | POA: Diagnosis not present

## 2023-05-27 DIAGNOSIS — J4550 Severe persistent asthma, uncomplicated: Secondary | ICD-10-CM | POA: Diagnosis not present

## 2023-05-27 MED ORDER — CLARITHROMYCIN 500 MG PO TABS: 500.0000 mg | ORAL_TABLET | Freq: Two times a day (BID) | ORAL | 0 refills | Status: DC

## 2023-05-27 NOTE — Progress Notes (Signed)
[facility], Critical Care, and Sleep Medicine  Chief Complaint  Patient presents with   Follow-up    Pt f/u to discuss PFT results.      Constitutional:  BP (!) 144/82   Pulse 92   Ht 5' (1.524 m)   Wt 130 lb 12.8 oz (59.3 kg)   LMP 08/04/1985 (Approximate) Comment: hysterectomy @ 67 years old  SpO2 97%   BMI 25.55 kg/m   Past Medical History:  Anxiety, HTN, GERD, Headache, Pneumonia, NSCLC, Vit D defiency, Iron deficient anemia  Past Surgical History:  She  has a past surgical history that includes Abdominal hysterectomy; Appendectomy; Hernia repair; Tubal ligation; Endobronchial ultrasound (Bilateral, 08/23/2016); Portacath placement (Left, 09/17/2016); Carpal tunnel release (Bilateral); and Port-a-cath removal (Left, 11/02/2021).  Brief Summary:  Emily Pope is a 67 y.o. 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      Subjective:   She was treated for sinus infection and pneumonia a couple months ago.  CT chest from April showed faint Lt lower lobe ASD and new LUL nodule.  She has cough with thick, white sputum.  Feels congested in her chest.  Hasn't been using her flutter valve.  Has been using mucinex or robitussin.  Nebulizer helps.  Not having fever or hemoptysis.  Gets winded with activity more easily.  PFT showed air trapping with moderate diffusion defect, but was fairly stable compared to 2018.  Ambulatory oximetry on room air today stayed above 97%.  Physical Exam:   Appearance - well kempt   ENMT - no sinus tenderness, no oral exudate, no LAN, Mallampati 3 airway, no stridor  Respiratory - decreased breath sounds, prolonged exhalation, no wheeze  CV - s1s2 regular rate and rhythm, no murmurs  Ext - no clubbing, no edema  Skin - no rashes  Psych - normal mood and affect       [facility] testing:  PFT 09/16/17 >> FEV1 1.79 (82%), FEV1% 89, TLC 3.69 (82%), DLCO 55% PFT 02/22/23 >>  FEV1 1.95 (96%), FEV1% 80, TLC 4.83 (108%), RV 2.54 (133%), DLCO 52%  Chest Imaging:  CT chest 03/17/21 >> radiation fibrosis changes RUL with 3.6 x 2.8 calcified mass, b/l paramedian radiation fibrosis, moderate centrilobular emphysema, RLL nodule 7 mm, scattered b/l lower lobe GGO CT chest 03/31/23 >> small HH, b/l paramediastinal XRT changes, calcified mass Rt lung apex, new 1.4 x 0.8 cm LU nodule, stable 8 mm RLL nodule, stable 5 mm LUL and 5 mm RLL nodule, consolidation in lower lobes, moderate centrilobular emphysema, fatty liver  Cardiac Tests:  Echo 03/15/18 >> EF 60 to 65%, grade 1 DD  Social History:  She  reports that she quit smoking about 6 years ago. Her smoking use included cigarettes. She has a 86.00 pack-year smoking history. She has never used smokeless tobacco. She reports that she does not drink alcohol and does not use drugs.  Family History:  Her family history includes Diabetes in her father; Hypertension in her father, mother, sister, and sister; Lung cancer in her brother and mother.     Assessment/Plan:   Obstructive lung disease. - singulair caused daytime sleepiness and increased blood pressure - likely allergic asthma/chronic bronchitis with COPD and emphysema - continue trelegy 100 one puff daily - mucinex/robitussion, albuterol, flutter valve bid - will give a course of clarithromycin  Perennial allergic rhinitis. - she is intolerant of nasal steroid sprays due to smell and taste - continue xyzal  Recurrent pneumonia. 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 Ellin Saba with Oncology - new nodule in Lt upper lobe seen on CT chest from April 2024 >> repeat CT chest in July 2024  Time Spent Involved in Patient Care on Day of  Examination:  37 minutes  Follow up:   Patient Instructions  Clarithromycin 500 mg pill twice daily for 7 days.  Use mucinex, then albuterol, then flutter valve in the morning and evening to help clear chest congestion.  Trelegy one puff daily and rinse your mouth after each use.  Follow up in 3 months.  Medication List:   Allergies as of 05/27/2023       Reactions   Clindamycin/lincomycin Rash   Codeine Anaphylaxis   Lincomycin Rash   Also Clindamycin as it has lincomycin in it Also Clindamycin as it has lincomycin in it   Other Rash   Penicillins Anaphylaxis   Has patient had a PCN reaction causing immediate rash, facial/tongue/throat swelling, SOB or lightheadedness with hypotension: Unknown Has patient had a PCN reaction causing severe rash involving mucus membranes or skin necrosis: Unknown Has patient had a PCN reaction that required hospitalization: No Has patient had a PCN reaction occurring within the last 10 years: No If all of the above answers are "NO", then may proceed with Cephalosporin use. Has patient had a PCN reaction causing immediate rash, facial/tongue/throat swelling, SOB or lightheadedness with hypotension: Unknown Has patient had a PCN reaction causing severe rash involving mucus membranes or skin necrosis: Unknown Has patient had a PCN reaction that required hospitalization: No Has patient had a PCN reaction occurring within the last 10 years: No If all of the above answers are "NO", then may proceed with Cephalosporin use.   Vancomycin Itching   Scalp itching   Sulfa Antibiotics Nausea Only   Hydrocodone Rash        Medication List        Accurate as of May 27, 2023  5:25 PM. If you have any questions, ask your nurse or doctor.          STOP taking these medications    atorvastatin 10 MG tablet Commonly known as: LIPITOR Stopped by: Coralyn Helling, MD       TAKE these medications    albuterol 108 (90 Base) MCG/ACT  inhaler Commonly known as: VENTOLIN HFA Inhale 2 puffs into the lungs every 6 (six) hours as needed for wheezing or shortness of breath.   albuterol (2.5 MG/3ML) 0.083% nebulizer solution Commonly known as: PROVENTIL Take 3 mLs (2.5 mg total) by nebulization every 6 (six) hours as needed for wheezing or shortness of breath.   ALPRAZolam 0.25 MG tablet Commonly known as: XANAX Take 0.25 mg by mouth daily. What changed: Another medication with the same name was removed. Continue taking this medication, and follow the directions you see here. Changed by: Coralyn Helling, MD   buPROPion 300 MG 24 hr tablet Commonly known as: WELLBUTRIN XL Take 300 mg by mouth daily. What changed: Another medication with the same name was removed. Continue taking this medication, and follow the directions you see here. Changed by: Coralyn Helling, MD   clarithromycin 500 MG tablet Commonly known as: BIAXIN Take 1 tablet (500 mg total) by mouth 2 (two) times daily. Started  by: Coralyn Helling, MD   Cyanocobalamin 5000 MCG Tbdp Dissolve in the mouth.   cyclobenzaprine 10 MG tablet Commonly known as: FLEXERIL Take 5-10 mg by mouth every 8 (eight) hours as needed.   docusate sodium 100 MG capsule Commonly known as: COLACE Take 100 mg by mouth in the morning.   hydrochlorothiazide 12.5 MG tablet Commonly known as: HYDRODIURIL Take 12.5 mg by mouth daily as needed (if blood pressure is greater than 128/82).   levocetirizine 5 MG tablet Commonly known as: XYZAL Take 5 mg by mouth every evening. What changed: Another medication with the same name was removed. Continue taking this medication, and follow the directions you see here. Changed by: Coralyn Helling, MD   losartan 50 MG tablet Commonly known as: COZAAR Take 50 mg by mouth daily as needed (if blood pressure is greater than 128/82).   Melatonin 10 MG Tabs Take 10 mg by mouth at bedtime.   naproxen sodium 220 MG tablet Commonly known as: ALEVE Take  220-440 mg by mouth 2 (two) times daily as needed (pain.).   rosuvastatin 5 MG tablet Commonly known as: CRESTOR   Trelegy Ellipta 100-62.5-25 MCG/ACT Aepb Generic drug: Fluticasone-Umeclidin-Vilant Take 1 puff by mouth in the morning.   Vitamin D (Ergocalciferol) 1.25 MG (50000 UNIT) Caps capsule Commonly known as: DRISDOL Take by mouth.        Signature:  Coralyn Helling, MD [facility]/Critical Care Pager - [phone] 05/27/2023, 5:25 PM

## 2023-05-27 NOTE — Patient Instructions (Signed)
Clarithromycin 500 mg pill twice daily for 7 days.  Use mucinex, then albuterol, then flutter valve in the morning and evening to help clear chest congestion.  Trelegy one puff daily and rinse your mouth after each use.  Follow up in 3 months.

## 2023-05-27 NOTE — Telephone Encounter (Signed)
Pharmacy calling in bc pt is prescribed  clarithromycin (BIAXIN) 500 MG tablet [[id number]]  and they wants to know if she should be taking it or not

## 2023-06-01 NOTE — Telephone Encounter (Signed)
Yes she has been prescribed biaxin 500 mg bid for 7 days.

## 2023-06-08 DIAGNOSIS — I10 Essential (primary) hypertension: Secondary | ICD-10-CM | POA: Diagnosis not present

## 2023-06-08 DIAGNOSIS — Z299 Encounter for prophylactic measures, unspecified: Secondary | ICD-10-CM | POA: Diagnosis not present

## 2023-06-22 DIAGNOSIS — R5383 Other fatigue: Secondary | ICD-10-CM | POA: Diagnosis not present

## 2023-06-22 DIAGNOSIS — R7989 Other specified abnormal findings of blood chemistry: Secondary | ICD-10-CM | POA: Diagnosis not present

## 2023-06-23 DIAGNOSIS — M50222 Other cervical disc displacement at C5-C6 level: Secondary | ICD-10-CM | POA: Diagnosis not present

## 2023-06-23 DIAGNOSIS — M542 Cervicalgia: Secondary | ICD-10-CM | POA: Diagnosis not present

## 2023-06-23 DIAGNOSIS — I10 Essential (primary) hypertension: Secondary | ICD-10-CM | POA: Diagnosis not present

## 2023-06-23 DIAGNOSIS — M4802 Spinal stenosis, cervical region: Secondary | ICD-10-CM | POA: Diagnosis not present

## 2023-06-23 DIAGNOSIS — M47812 Spondylosis without myelopathy or radiculopathy, cervical region: Secondary | ICD-10-CM | POA: Diagnosis not present

## 2023-06-23 DIAGNOSIS — Z299 Encounter for prophylactic measures, unspecified: Secondary | ICD-10-CM | POA: Diagnosis not present

## 2023-06-23 DIAGNOSIS — M5030 Other cervical disc degeneration, unspecified cervical region: Secondary | ICD-10-CM | POA: Diagnosis not present

## 2023-06-27 ENCOUNTER — Other Ambulatory Visit: Payer: Self-pay

## 2023-06-27 DIAGNOSIS — C3411 Malignant neoplasm of upper lobe, right bronchus or lung: Secondary | ICD-10-CM

## 2023-06-29 ENCOUNTER — Encounter (HOSPITAL_COMMUNITY): Payer: Self-pay | Admitting: Radiology

## 2023-06-29 ENCOUNTER — Inpatient Hospital Stay: Payer: 59 | Attending: Hematology

## 2023-06-29 ENCOUNTER — Ambulatory Visit (HOSPITAL_COMMUNITY)
Admission: RE | Admit: 2023-06-29 | Discharge: 2023-06-29 | Disposition: A | Discharge status: Home / Self Care | Payer: 59 | Source: Ambulatory Visit | Attending: Hematology | Admitting: Hematology

## 2023-06-29 DIAGNOSIS — Z79899 Other long term (current) drug therapy: Secondary | ICD-10-CM | POA: Insufficient documentation

## 2023-06-29 DIAGNOSIS — E559 Vitamin D deficiency, unspecified: Secondary | ICD-10-CM | POA: Insufficient documentation

## 2023-06-29 DIAGNOSIS — Z87891 Personal history of nicotine dependence: Secondary | ICD-10-CM | POA: Insufficient documentation

## 2023-06-29 DIAGNOSIS — Z85118 Personal history of other malignant neoplasm of bronchus and lung: Secondary | ICD-10-CM | POA: Insufficient documentation

## 2023-06-29 DIAGNOSIS — C3411 Malignant neoplasm of upper lobe, right bronchus or lung: Secondary | ICD-10-CM | POA: Insufficient documentation

## 2023-06-29 DIAGNOSIS — Z7952 Long term (current) use of systemic steroids: Secondary | ICD-10-CM | POA: Insufficient documentation

## 2023-06-29 DIAGNOSIS — C3490 Malignant neoplasm of unspecified part of unspecified bronchus or lung: Secondary | ICD-10-CM | POA: Diagnosis not present

## 2023-06-29 DIAGNOSIS — Z7982 Long term (current) use of aspirin: Secondary | ICD-10-CM | POA: Insufficient documentation

## 2023-06-29 DIAGNOSIS — D509 Iron deficiency anemia, unspecified: Secondary | ICD-10-CM | POA: Insufficient documentation

## 2023-06-29 DIAGNOSIS — Z801 Family history of malignant neoplasm of trachea, bronchus and lung: Secondary | ICD-10-CM | POA: Diagnosis not present

## 2023-06-29 LAB — COMPREHENSIVE METABOLIC PANEL
ALT: 48 U/L — ABNORMAL HIGH (ref 0–44)
AST: 43 U/L — ABNORMAL HIGH (ref 15–41)
Albumin: 4.2 g/dL (ref 3.5–5.0)
Alkaline Phosphatase: 117 U/L (ref 38–126)
Anion gap: 11 (ref 5–15)
BUN: 15 mg/dL (ref 8–23)
CO2: 25 mmol/L (ref 22–32)
Calcium: 9.2 mg/dL (ref 8.9–10.3)
Chloride: 101 mmol/L (ref 98–111)
Creatinine, Ser: 0.9 mg/dL (ref 0.44–1.00)
GFR, Estimated: >60 mL/min (ref >60)
Glucose, Bld: 83 mg/dL (ref 70–99)
Potassium: 3.4 mmol/L — ABNORMAL LOW (ref 3.5–5.1)
Sodium: 137 mmol/L (ref 135–145)
Total Bilirubin: 0.5 mg/dL (ref 0.3–1.2)
Total Protein: 7.7 g/dL (ref 6.5–8.1)

## 2023-06-29 LAB — CBC WITH DIFFERENTIAL/PLATELET
Abs Immature Granulocytes: 0.04 10*3/uL (ref 0.00–0.07)
Basophils Absolute: 0.1 10*3/uL (ref 0.0–0.1)
Basophils Relative: 1 %
Eosinophils Absolute: 0.3 10*3/uL (ref 0.0–0.5)
Eosinophils Relative: 4 %
HCT: 39.3 % (ref 36.0–46.0)
Hemoglobin: 13.5 g/dL (ref 12.0–15.0)
Immature Granulocytes: 1 %
Lymphocytes Relative: 19 %
Lymphs Abs: 1.2 10*3/uL (ref 0.7–4.0)
MCH: 35 pg — ABNORMAL HIGH (ref 26.0–34.0)
MCHC: 34.4 g/dL (ref 30.0–36.0)
MCV: 101.8 fL — ABNORMAL HIGH (ref 80.0–100.0)
Monocytes Absolute: 0.5 10*3/uL (ref 0.1–1.0)
Monocytes Relative: 8 %
Neutro Abs: 4.3 10*3/uL (ref 1.7–7.7)
Neutrophils Relative %: 67 %
Platelets: 339 10*3/uL (ref 150–400)
RBC: 3.86 MIL/uL — ABNORMAL LOW (ref 3.87–5.11)
RDW: 13.2 % (ref 11.5–15.5)
WBC: 6.4 10*3/uL (ref 4.0–10.5)
nRBC: 0 % (ref 0.0–0.2)

## 2023-06-29 MED ORDER — IOHEXOL 300 MG/ML  SOLN
75.0000 mL | Freq: Once | INTRAMUSCULAR | Status: AC | PRN
  Administered 2023-06-29: 75 mL via INTRAVENOUS

## 2023-07-02 DIAGNOSIS — J441 Chronic obstructive pulmonary disease with (acute) exacerbation: Secondary | ICD-10-CM | POA: Diagnosis not present

## 2023-07-02 DIAGNOSIS — I10 Essential (primary) hypertension: Secondary | ICD-10-CM | POA: Diagnosis not present

## 2023-07-06 NOTE — Progress Notes (Signed)
[facility] [address], [place] [address]    Clinic Day:  07/07/2023  Referring physician: Ignatius Specking, MD  Patient Care Team: Ignatius Specking, MD as PCP - General (Internal Medicine) Jena Gauss Gerrit Friends, MD as Consulting Physician (Gastroenterology)   ASSESSMENT & PLAN:   Assessment: 1.  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 -Durvalumab consolidation from 01/25/2017, discontinued on 05/09/2017 secondary to pneumonitis. -PET scan on 11/20/2018 showed further reduction in activity associated with the right apical consolidation, likely treatment related, without current evidence of malignancy.  Chronically stable 7 mm nodule in the right lateral costophrenic angle without current hypermetabolic activity. -CT of the chest on 06/01/2019 which showed treatment effect with the right upper lobe with no evidence of local recurrence or residual disease. -CT chest 12/11/2019 showed no changes in right apical consolidation.  A new 3 mm left upper lobe nodule is new since the prior exam.  If new left upper lobe nodule has increased in size this would warrant a PET CT followed by possible biopsy if FDG avid. -CT of the chest done on 06/09/2020 showed stable appearance of posttreatment changes involving the right upper lobe with compensatory hyperinflation of the right middle lobe and right lower lobe.  Stable subpleural nodules in the right lower lobe and 3 mm left upper lobe lung nodule.  Previously noted 5 mm  peribronchovascular nodule now measures 3 mm and is likely postinflammatory. -CT chest on 01/22/2021 at [facility] showed new bilateral lower lobe airspace opacities concerning for pneumonia.  Trace bilateral effusions.  No evidence  of pulmonary embolism.  Postradiation changes in the right upper lobe, unchanged.   2.  COPD/pneumonitis: -She is using albuterol and Trelegy.   3.  Vitamin D deficiency: -Labs done on 06/09/2020 showed vitamin D level 22.66. -She stopped taking the combination vitamin D and calcium due to constipation. -She will start taking an vitamin D3 by itself. -We will recheck her labs at her next visit.   4.  Iron deficiency anemia: -She is taking oral iron tablets daily. -Labs done on 06/09/2020 showed hemoglobin 12.4.   Plan: 1.  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 - Reviewed labs from 06/29/2023: AST and ALT are elevated at 43 and 48, normal previously.  Rest of the CMP was within normal limits.  CBC was grossly normal. - Reviewed CT chest from 06/29/2023: Left upper lobe lung lesion has increased measuring 16 x 7 mm, previously 5 mm.  Previously dominant left upper lobe nodule measures 14 x 5 mm, previously 14 x 8 mm.  Patchy irregular consolidation of the left lower lobe overall improved. - I will arrange for a PET CT scan in a month and will repeat LFTs.  Orders Placed This Encounter  Procedures   NM PET Image Restag (PS) Skull Base To Thigh    Standing Status:   Future    Standing Expiration Date:   07/06/2024    Order Specific Question:   If indicated for the ordered procedure, I authorize the administration of a radiopharmaceutical per Radiology protocol    Answer:   Yes    Order Specific Question:   Preferred imaging location?  Answer:   [facility]   Hepatic function panel    Standing Status:   Future    Standing Expiration Date:   07/06/2024      [name],acting as a scribe for [name], MD.,have documented all relevant documentation on the behalf of [name], MD,as directed by  [name], MD while in the presence of  [name], MD.  I, [name] MD, have reviewed the above documentation for accuracy and completeness, and I agree with the above.    [name], MD   7/11/20241:44 PM  CHIEF COMPLAINT:   Diagnosis: right lung cancer    Cancer Staging  Primary cancer of right upper lobe of lung [facility]) Staging form: Lung, AJCC 7th Edition - Clinical stage from 09/27/2016: Stage IIIB (T4, N3, M0) - Signed by Ellouise Newer, PA-C on 09/27/2016    Prior Therapy: 1. Chemoradiation with carboplatin and paclitaxel from 09/20/2016 to 10/25/2016 with consolidation to 12/06/2016. 2. Durvalumab consolidation from 01/25/2017 to 05/09/2017.  Current Therapy:  surveillance    HISTORY OF PRESENT ILLNESS:   Oncology History Overview Note  Stage IIIB (T4 N3 M0) carcinoma of RUL of lung, immunophenotyping consistent with adenocarcinoma.  S/P concomitant chemoradiation with carboplatin/paclitaxel (09/20/2016- 10/25/2016) followed by 2 cycle of consolidative chemotherapy with carboplatin/paclitaxel (11/15/2016- 12/06/2016).  Unable to tolerate consolidative durvalumab (Imfinzi) beginning on 01/26/2016 and stopped 05/2017.   AND Imfinzi-induced pneumonitis, grade 2, resulting in holding Imfinzi from 04/25/2017- 05/09/2017 with corticosteroids treatment with taper.  This resolved/improved with steroids. Developed pnemonitis again after rechallenge with imfinzi. On steroid taper again.     Primary cancer of right upper lobe of lung (HCC)  08/04/2016 PET scan   PET IMPRESSION: 1. 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 Mass is confluent with the right superior hilum and right lower tracheal wall, suggestive of a T4 primary tumor. 2. Postobstructive pneumonia in the right upper lobe. 3. Hypermetabolic ipsilateral and contralateral mediastinal  and contralateral hilar lymphadenopathy, suggestive of N3 nodal disease.   08/24/2016 Procedure   Bronchoscopy   08/25/2016 Pathology Results   Diagnosis Endobronchial biopsy, RUL - POORLY DIFFERENTIATED NON-SMALL CELL CARCINOMA. The immunophenotype is consistent with poorly differentiated adenocarcinoma.   09/10/2016 Imaging   MRI brain- No metastatic disease or acute intracranial abnormality.   09/20/2016 - 10/25/2016 Chemotherapy   Carboplatin/Paclitaxel weekly x 6 with XRT.   09/20/2016 - 10/25/2016 Radiation Therapy     11/15/2016 - 12/06/2016 Chemotherapy   Carboplatin/paclitaxel every 21 days x 2 cycles.   12/30/2016 Imaging   CT CAP- 1. Central right upper lobe lung mass has mildly decreased since 11/21/2016 chest CT angiogram and significantly decreased since 08/04/2016 PET-CT. 2. No pathologically enlarged thoracic lymph nodes. Previously described hypermetabolic thoracic adenopathy on the 08/04/2016 PET-CT has decreased in size. 3. Previously described bilateral lower lobe pulmonary nodules are stable in size. 4. New solitary subsolid 4 mm right middle lobe pulmonary nodule is nonspecific, and may be inflammatory. Recommend attention on a follow-up chest CT in 3 months. 5. No definite metastatic disease in the abdomen. Tiny sub 5 mm low-attenuation lesions in the liver are too small to characterize, favor benign. 6. Aortic atherosclerosis.  One vessel coronary atherosclerosis. 7. Moderate emphysema.   01/25/2017 -  Chemotherapy   Durvalumab (Imfinzi) x 52 weeks.    03/31/2017 Imaging   CT chest with contrast: Stable size of partially calcified mass  in the posterior right upper lobe.   Increased airspace disease in paramediastinal right upper and superior right lower lobes. Differential diagnosis includes radiation pneumonitis, drug reaction, and infection.   Stable sub-cm right lower lobe pulmonary nodule.   No evidence of lymphadenopathy or pleural  effusion.   Emphysema.   Aortic and coronary artery atherosclerosis.   04/25/2017 Adverse Reaction   Progressive cough, concerning for pneumonitis.   04/25/2017 Treatment Plan Change   Progressive cough concerning for pneumonitis.  Treatment held.  Managed by steroids.   05/09/2017 Treatment Plan Change   Restart Imfinzi with improvement of cough, Grade 1.   05/27/2017 Imaging   CT CHEST WITH CONTRAST IMPRESSION: 1. Similar size of partially calcified posterior right apical lung mass. 2. Similar to minimal increase in surrounding radiation fibrosis. 3. Subtle areas of mosaic attenuation are greater on the right. Likely attributed to mild ground-glass opacity. In the appropriate clinical setting, this could represent drug toxicity induced pneumonitis. 4.  Coronary artery atherosclerosis. Aortic atherosclerosis. 5. Similar right lower lobe pulmonary nodule.   05/27/2017 Adverse Reaction   Developed worsening cough and SOB again after imfinzi. Grade 2. Placed on long steroid taper.  Permanently discontinue any further imfinzi treatments.   07/29/2017 Imaging   CT chest/abd/pelvis: IMPRESSION: 1. Stable post treatment related changes of mass-like radiation fibrosis in the upper right lung with similar appearance of densely calcified mass in the apex of the right upper lobe. No finding to suggest local recurrence of disease or metastatic disease in the chest, abdomen or pelvis on today's examination. 2. 7 mm subpleural nodule in the periphery of the right lower lobe is stable and favored to be benign. Continued attention on followup studies is recommended to ensure stability. 3. Aortic atherosclerosis, in addition to left anterior descending coronary artery disease. Please note that although the presence of coronary artery calcium documents the presence of coronary artery disease, the severity of this disease and any potential stenosis cannot be assessed on this non-gated CT  examination. Assessment for potential risk factor modification, dietary therapy or pharmacologic therapy may be warranted, if clinically indicated. 4. There are calcifications of the mitral valve. Echocardiographic correlation for evaluation of potential valvular dysfunction may be warranted if clinically indicated. 5. Mild diffuse bronchial wall thickening with mild to moderate centrilobular and mild paraseptal emphysema; imaging findings suggestive of underlying COPD. 6. Tip of left subclavian Port-A-Cath is now misdirected into the subclavian vein.      INTERVAL HISTORY:   Emily Pope is a 67 y.o. female presenting to clinic today for follow up of right lung cancer. She was last seen by me on 04/07/23.  Since her last visit, she underwent a CT of the chest w contrast which showed previously described dominant irregular solid pulmonary nodule of the left upper lobe decreased in size, an irregular solid pulmonary nodule of the left upper lobe increased in size, measuring 16 x 7 mm, previously 5 mm, waxing and waning patchy irregular consolidations of the bilateral lower lobes likely due to infection or aspiration, and stable bilateral paramediastinal postradiation change.  Of note, she was seen in the ED on 5/18 for an adverse effect of a drug at [facility] in [place]. She was seen in the ED of [facility] in [place] on 5/21 for xerostomia and dysphagia.   Today, she states that she is doing well overall. Her appetite level is at 100%. Her energy level is at 10%.  PAST MEDICAL HISTORY:   Past Medical  History: 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    Surgical  History: Past Surgical History:  Procedure Laterality Date   ABDOMINAL HYSTERECTOMY     APPENDECTOMY     when had hysterectomy   CARPAL TUNNEL RELEASE Bilateral    ENDOBRONCHIAL ULTRASOUND Bilateral 08/23/2016   Procedure: ENDOBRONCHIAL ULTRASOUND;  Surgeon: Leslye Peer, MD;  Location: [facility];  Service: Cardiopulmonary;  Laterality: Bilateral;   HERNIA REPAIR     left inguinal hernia age 26   PORT-A-CATH REMOVAL Left 11/02/2021   Procedure: MINOR REMOVAL PORT-A-CATH;  Surgeon: Franky Macho, MD;  Location: [facility];  Service: General;  Laterality: Left;  pt to arrive at 8:30   PORTACATH PLACEMENT Left 09/17/2016   Procedure: INSERTION PORT-A-CATH LEFT SUBCLAVIAN;  Surgeon: Franky Macho, MD;  Location: [facility];  Service: General;  Laterality: Left;   TUBAL LIGATION      Social History: Social History   Socioeconomic History   Marital status: Divorced    Spouse name: Not on file   Number of children: Not on file   Years of education: Not on file   Highest education level: Not on file  Occupational History   Not on file  Tobacco Use   Smoking status: Former    Current packs/day: 0.00    Average packs/day: 2.0 packs/day for 43.0 years (86.0 ttl pk-yrs)    Types: Cigarettes    Start date: 07/20/1973    Quit date: 07/20/2016    Years since quitting: 6.9   Smokeless tobacco: Never  Vaping Use   Vaping status: Never Used  Substance and Sexual Activity   Alcohol use: No   Drug use: No   Sexual activity: Not on file  Other Topics Concern   Not on file  Social History Narrative   Not on file   Social Determinants of Health   Financial Resource Strain: Low Risk  (06/24/2021)   Overall Financial Resource Strain (CARDIA)    Difficulty of Paying Living Expenses: Not hard at all  Food Insecurity: No Food Insecurity (06/24/2021)   Hunger Vital Sign    Worried About Running Out of Food in the Last Year: Never true    Ran Out of Food in the Last Year: Never true  Transportation  Needs: No Transportation Needs (06/24/2021)   PRAPARE - Administrator, Civil Service (Medical): No    Lack of Transportation (Non-Medical): No  Physical Activity: Insufficiently Active (06/24/2021)   Exercise Vital Sign    Days of Exercise per Week: 7 days    Minutes of Exercise per Session: 10 min  Stress: No Stress Concern Present (06/24/2021)   [employer] of Occupational Health - Occupational Stress Questionnaire    Feeling of Stress : Not at all  Social Connections: Moderately Isolated (06/24/2021)   Social Connection and Isolation Panel [NHANES]    Frequency of Communication with Friends and Family: More than three times a week    Frequency of Social Gatherings with Friends and Family: Once a week    Attends Religious Services: Never    Database administrator or Organizations: No  Attends Banker Meetings: Never    Marital Status: Married  Catering manager Violence: Not At Risk (05/02/2023)   Received from [facility], [facility]   Humiliation, Afraid, Rape, and Kick questionnaire    Fear of Current or Ex-Partner: No    Emotionally Abused: No    Physically Abused: No    Sexually Abused: No    Family History: Family History  Problem Relation Age of Onset   Hypertension Mother    Lung cancer Mother    Hypertension Father    Diabetes Father    Hypertension Sister    Hypertension Sister    Lung cancer Brother    Colon cancer Neg Hx    Breast cancer Neg Hx     Current Medications:  Current Outpatient Medications:    albuterol (PROVENTIL HFA;VENTOLIN HFA) 108 (90 Base) MCG/ACT inhaler, Inhale 2 puffs into the lungs every 6 (six) hours as needed for wheezing or shortness of breath., Disp: 1 Inhaler, Rfl: 6   albuterol (PROVENTIL) (2.5 MG/3ML) 0.083% nebulizer solution, Take 3 mLs (2.5 mg total) by nebulization every 6 (six) hours as needed for wheezing or shortness of breath., Disp: 360 mL, Rfl: 5   ALPRAZolam (XANAX) 0.25 MG tablet,  Take 0.25 mg by mouth daily., Disp: , Rfl:    Artificial Saliva (AQUORAL) SOLN, by Transmucosal route., Disp: , Rfl:    aspirin EC 81 MG tablet, Take 1 tablet by mouth daily., Disp: , Rfl:    buPROPion (WELLBUTRIN XL) 300 MG 24 hr tablet, Take 300 mg by mouth daily., Disp: , Rfl:    clarithromycin (BIAXIN) 500 MG tablet, Take 1 tablet (500 mg total) by mouth 2 (two) times daily., Disp: 14 tablet, Rfl: 0   Cyanocobalamin 5000 MCG TBDP, Dissolve in the mouth., Disp: , Rfl:    cyclobenzaprine (FLEXERIL) 10 MG tablet, Take 5-10 mg by mouth every 8 (eight) hours as needed., Disp: , Rfl:    docusate sodium (COLACE) 100 MG capsule, Take 100 mg by mouth in the morning., Disp: , Rfl:    hydrochlorothiazide (HYDRODIURIL) 12.5 MG tablet, Take 12.5 mg by mouth daily as needed (if blood pressure is greater than 128/82)., Disp: , Rfl: 0   levocetirizine (XYZAL) 5 MG tablet, Take 5 mg by mouth every evening., Disp: , Rfl:    losartan (COZAAR) 50 MG tablet, Take 50 mg by mouth daily as needed (if blood pressure is greater than 128/82)., Disp: , Rfl: 0   Melatonin 10 MG TABS, Take 10 mg by mouth at bedtime., Disp: , Rfl:    naproxen sodium (ALEVE) 220 MG tablet, Take 220-440 mg by mouth 2 (two) times daily as needed (pain.)., Disp: , Rfl:    predniSONE (DELTASONE) 20 MG tablet, Take 20 mg by mouth 2 (two) times daily., Disp: , Rfl:    propranolol (INDERAL) 10 MG tablet, , Disp: , Rfl:    rosuvastatin (CRESTOR) 5 MG tablet, , Disp: , Rfl:    TRELEGY ELLIPTA 100-62.5-25 MCG/INH AEPB, Take 1 puff by mouth in the morning., Disp: , Rfl: 0   VEOZAH 45 MG TABS, Take 1 tablet by mouth daily., Disp: , Rfl:    Vitamin D, Ergocalciferol, (DRISDOL) 1.25 MG (50000 UNIT) CAPS capsule, Take by mouth., Disp: , Rfl:    Allergies: Allergies  Allergen Reactions   Clindamycin/Lincomycin Rash   Codeine Anaphylaxis   Lincomycin Rash    Also Clindamycin as it has lincomycin in it Also Clindamycin as it has lincomycin  in it    Other Rash   Penicillins Anaphylaxis    Has patient had a PCN reaction causing immediate rash, facial/tongue/throat swelling, SOB or lightheadedness with hypotension: Unknown Has patient had a PCN reaction causing severe rash involving mucus membranes or skin necrosis: Unknown Has patient had a PCN reaction that required hospitalization: No Has patient had a PCN reaction occurring within the last 10 years: No If all of the above answers are "NO", then may proceed with Cephalosporin use. Has patient had a PCN reaction causing immediate rash, facial/tongue/throat swelling, SOB or lightheadedness with hypotension: Unknown Has patient had a PCN reaction causing severe rash involving mucus membranes or skin necrosis: Unknown Has patient had a PCN reaction that required hospitalization: No Has patient had a PCN reaction occurring within the last 10 years: No If all of the above answers are "NO", then may proceed with Cephalosporin use.    Vancomycin Itching    Scalp itching   Sulfa Antibiotics Nausea Only   Hydrocodone Rash    REVIEW OF SYSTEMS:   Review of Systems  Constitutional:  Negative for chills, fatigue and fever.  HENT:   Negative for lump/mass, mouth sores, nosebleeds, sore throat and trouble swallowing.   Eyes:  Negative for eye problems.  Respiratory:  Positive for cough and shortness of breath.   Cardiovascular:  Negative for chest pain, leg swelling and palpitations.  Gastrointestinal:  Negative for abdominal pain, constipation, diarrhea, nausea and vomiting.  Genitourinary:  Negative for bladder incontinence, difficulty urinating, dysuria, frequency, hematuria and nocturia.   Musculoskeletal:  Negative for arthralgias, back pain, flank pain, myalgias and neck pain.  Skin:  Negative for itching and rash.  Neurological:  Negative for dizziness, headaches and numbness.  Hematological:  Does not bruise/bleed easily.  Psychiatric/Behavioral:  Negative for depression, sleep  disturbance and suicidal ideas. The patient is nervous/anxious.   All other systems reviewed and are negative.    VITALS:   Blood pressure (!) 136/96, pulse 98, temperature 98.3 F (36.8 C), temperature source Tympanic, resp. rate (!) 22, last menstrual period 08/04/1985, SpO2 98%.  Wt Readings from Last 3 Encounters:  05/27/23 130 lb 12.8 oz (59.3 kg)  04/07/23 127 lb 4.8 oz (57.7 kg)  01/03/23 124 lb 9.6 oz (56.5 kg)    There is no height or weight on file to calculate BMI.  Performance status (ECOG): 1 - Symptomatic but completely ambulatory  PHYSICAL EXAM:   Physical Exam Vitals and nursing note reviewed. Exam conducted with a chaperone present.  Constitutional:      Appearance: Normal appearance.  Cardiovascular:     Rate and Rhythm: Normal rate and regular rhythm.     Pulses: Normal pulses.     Heart sounds: Normal heart sounds.  Pulmonary:     Effort: Pulmonary effort is normal.     Breath sounds: Normal breath sounds.  Abdominal:     Palpations: Abdomen is soft. There is no hepatomegaly, splenomegaly or mass.     Tenderness: There is no abdominal tenderness.  Musculoskeletal:     Right lower leg: No edema.     Left lower leg: No edema.  Lymphadenopathy:     Cervical: No cervical adenopathy.     Right cervical: No superficial, deep or posterior cervical adenopathy.    Left cervical: No superficial, deep or posterior cervical adenopathy.     Upper Body:     Right upper body: No supraclavicular or axillary adenopathy.     Left upper body: No  supraclavicular or axillary adenopathy.  Neurological:     General: No focal deficit present.     Mental Status: She is alert and oriented to person, place, and time.  Psychiatric:        Mood and Affect: Mood normal.        Behavior: Behavior normal.     LABS:      Latest Ref Rng & Units 06/29/2023    8:44 AM 03/31/2023    9:55 AM 09/30/2022    8:43 AM  CBC  WBC 4.0 - 10.5 K/uL 6.4  5.9  4.8   Hemoglobin 12.0 - 15.0  g/dL 16.1  09.6  04.5   Hematocrit 36.0 - 46.0 % 39.3  42.7  38.6   Platelets 150 - 400 K/uL 339  321  305       Latest Ref Rng & Units 06/29/2023    8:44 AM 03/31/2023    9:55 AM 09/30/2022    8:43 AM  CMP  Glucose 70 - 99 mg/dL 83  91  73   BUN 8 - 23 mg/dL 15  14  12    Creatinine 0.44 - 1.00 mg/dL 4.09  8.11  9.14   Sodium 135 - 145 mmol/L 137  134  140   Potassium 3.5 - 5.1 mmol/L 3.4  4.1  4.1   Chloride 98 - 111 mmol/L 101  98  104   CO2 22 - 32 mmol/L 25  26  27    Calcium 8.9 - 10.3 mg/dL 9.2  9.2  9.1   Total Protein 6.5 - 8.1 g/dL 7.7  7.3  7.3   Total Bilirubin 0.3 - 1.2 mg/dL 0.5  0.4  0.4   Alkaline Phos 38 - 126 U/L 117  101  83   AST 15 - 41 U/L 43  27  23   ALT 0 - 44 U/L 48  20  24      No results found for: "CEA1", "CEA" / No results found for: "CEA1", "CEA" No results found for: "PSA1" No results found for: "NWG956" No results found for: "CAN125"  No results found for: "TOTALPROTELP", "ALBUMINELP", "A1GS", "A2GS", "BETS", "BETA2SER", "GAMS", "MSPIKE", "SPEI" Lab Results  Component Value Date   TIBC 319 09/24/2021   TIBC 314 06/17/2021   TIBC 311 03/13/2021   FERRITIN 59 09/24/2021   FERRITIN 65 06/17/2021   FERRITIN 68 03/13/2021   IRONPCTSAT 26 09/24/2021   IRONPCTSAT 33 (H) 06/17/2021   IRONPCTSAT 27 03/13/2021   Lab Results  Component Value Date   LDH 167 09/24/2021   LDH 135 03/13/2021   LDH 162 12/08/2020     STUDIES:   CT Chest W Contrast  Result Date: 07/01/2023 CLINICAL DATA:  Lung malignancy; * Tracking Code: BO * EXAM: CT CHEST WITH CONTRAST TECHNIQUE: Multidetector CT imaging of the chest was performed during intravenous contrast administration. RADIATION DOSE REDUCTION: This exam was performed according to the departmental dose-optimization program which includes automated exposure control, adjustment of the mA and/or kV according to patient size and/or use of iterative reconstruction technique. CONTRAST:  75mL OMNIPAQUE IOHEXOL 300 MG/ML   SOLN COMPARISON:  Multiple priors, most recent March 31, 2023 prior FINDINGS: Cardiovascular: Normal heart size. Trace pericardial effusion. Normal caliber thoracic aorta with severe atherosclerotic disease. Moderate coronary artery calcifications. Mitral annular calcifications. Mediastinum/Nodes: Esophagus and thyroid are unremarkable. Stable prominent paraesophageal lymph nodes. Reference right paraesophageal lymph node measuring 6 mm in short axis on series 2, image 96, unchanged. Lungs/Pleura: Central airways  are patent. Stable bilateral paramediastinal postradiation change. Stable calcified lesion of the right lung apex, likely treated disease. Previously described dominant irregular solid pulmonary nodule of the left upper lobe is decreased in size, measuring 14 x 5 mm on series 4, image 40, previously 14 x 8 mm. Although, an additional solid pulmonary nodule of the left upper lobe is increased in size measuring 16 x 7 mm on series 4, image 45, previously 5 mm. Patchy irregular consolidations of the left lower lobe are overall improved when compared with the prior exam, however there are new irregular consolidations of the right lower lobe. Additional scattered solid pulmonary nodules are stable. Reference well-defined solid nodule of the right lower lobe measuring mm on series 4, image 104. Upper Abdomen: Hepatic steatosis.  No acute abnormality. Musculoskeletal: No chest wall abnormality. No acute or significant osseous findings. IMPRESSION: 1. Previously described dominant irregular solid pulmonary nodule of the left upper lobe is decreased in size. However, an additional irregular solid pulmonary nodule of the left upper lobe is increased in size, measuring 16 x 7 mm, previously 5 mm. Consider further evaluation with PET-CT or short-term follow-up in 3 months. 2. Waxing and waning patchy irregular consolidations of the bilateral lower lobes likely due to infection or aspiration. 3. Stable bilateral  paramediastinal postradiation change. Electronically Signed   By: Allegra Lai M.D.   On: 07/01/2023 20:05

## 2023-07-07 ENCOUNTER — Encounter: Payer: Self-pay | Admitting: Hematology

## 2023-07-07 ENCOUNTER — Inpatient Hospital Stay (HOSPITAL_BASED_OUTPATIENT_CLINIC_OR_DEPARTMENT_OTHER): Payer: 59 | Admitting: Hematology

## 2023-07-07 VITALS — BP 136/96 | HR 98 | Temp 98.3°F | Resp 22

## 2023-07-07 DIAGNOSIS — C3411 Malignant neoplasm of upper lobe, right bronchus or lung: Secondary | ICD-10-CM | POA: Diagnosis not present

## 2023-07-07 DIAGNOSIS — Z7952 Long term (current) use of systemic steroids: Secondary | ICD-10-CM | POA: Diagnosis not present

## 2023-07-07 DIAGNOSIS — Z87891 Personal history of nicotine dependence: Secondary | ICD-10-CM | POA: Diagnosis not present

## 2023-07-07 DIAGNOSIS — Z801 Family history of malignant neoplasm of trachea, bronchus and lung: Secondary | ICD-10-CM | POA: Diagnosis not present

## 2023-07-07 DIAGNOSIS — Z79899 Other long term (current) drug therapy: Secondary | ICD-10-CM | POA: Diagnosis not present

## 2023-07-07 DIAGNOSIS — R7989 Other specified abnormal findings of blood chemistry: Secondary | ICD-10-CM | POA: Diagnosis not present

## 2023-07-07 DIAGNOSIS — Z7982 Long term (current) use of aspirin: Secondary | ICD-10-CM | POA: Diagnosis not present

## 2023-07-07 DIAGNOSIS — D509 Iron deficiency anemia, unspecified: Secondary | ICD-10-CM | POA: Diagnosis not present

## 2023-07-07 DIAGNOSIS — E559 Vitamin D deficiency, unspecified: Secondary | ICD-10-CM | POA: Diagnosis not present

## 2023-07-07 NOTE — Patient Instructions (Signed)
[facility] at [facility] Discharge Instructions   You were seen and examined today by Dr. Ellin Saba.  He reviewed the results of your CT of the chest.   We will arrange for you to have a PET scan to investigate this further.   We will see you back after the PET scan to review those results.   Return as scheduled.    Thank you for choosing [facility] at [facility] to provide your oncology and hematology care.  To afford each patient quality time with our provider, please arrive at least 15 minutes before your scheduled appointment time.   If you have a lab appointment with the Cancer Center please come in thru the Main Entrance and check in at the main information desk.  You need to re-schedule your appointment should you arrive 10 or more minutes late.  We strive to give you quality time with our providers, and arriving late affects you and other patients whose appointments are after yours.  Also, if you no show three or more times for appointments you may be dismissed from the clinic at the providers discretion.     Again, thank you for choosing [facility].  Our hope is that these requests will decrease the amount of time that you wait before being seen by our physicians.       _____________________________________________________________  Should you have questions after your visit to [facility], please contact our office at [phone] and follow the prompts.  Our office hours are 8:00 a.m. and 4:30 p.m. Monday - Friday.  Please note that voicemails left after 4:00 p.m. may not be returned until the following business day.  We are closed weekends and major holidays.  You do have access to a nurse 24-7, just call the main number to the clinic [phone] and do not press any options, hold on the line and a nurse will answer the phone.    For prescription refill requests, have your pharmacy contact our office  and allow 72 hours.    Due to Covid, you will need to wear a mask upon entering the hospital. If you do not have a mask, a mask will be given to you at the Main Entrance upon arrival. For doctor visits, patients may have 1 support person age 56 or older with them. For treatment visits, patients can not have anyone with them due to social distancing guidelines and our immunocompromised population.

## 2023-07-20 DIAGNOSIS — Z0000 Encounter for general adult medical examination without abnormal findings: Secondary | ICD-10-CM | POA: Diagnosis not present

## 2023-07-20 DIAGNOSIS — E559 Vitamin D deficiency, unspecified: Secondary | ICD-10-CM | POA: Diagnosis not present

## 2023-07-20 DIAGNOSIS — I700 Atherosclerosis of aorta: Secondary | ICD-10-CM | POA: Diagnosis not present

## 2023-07-20 DIAGNOSIS — J449 Chronic obstructive pulmonary disease, unspecified: Secondary | ICD-10-CM | POA: Diagnosis not present

## 2023-07-20 DIAGNOSIS — R7989 Other specified abnormal findings of blood chemistry: Secondary | ICD-10-CM | POA: Diagnosis not present

## 2023-07-20 DIAGNOSIS — I10 Essential (primary) hypertension: Secondary | ICD-10-CM | POA: Diagnosis not present

## 2023-07-20 DIAGNOSIS — Z299 Encounter for prophylactic measures, unspecified: Secondary | ICD-10-CM | POA: Diagnosis not present

## 2023-08-01 DIAGNOSIS — B370 Candidal stomatitis: Secondary | ICD-10-CM | POA: Diagnosis not present

## 2023-08-01 DIAGNOSIS — J029 Acute pharyngitis, unspecified: Secondary | ICD-10-CM | POA: Diagnosis not present

## 2023-08-01 DIAGNOSIS — Z299 Encounter for prophylactic measures, unspecified: Secondary | ICD-10-CM | POA: Diagnosis not present

## 2023-08-01 DIAGNOSIS — I10 Essential (primary) hypertension: Secondary | ICD-10-CM | POA: Diagnosis not present

## 2023-08-04 ENCOUNTER — Inpatient Hospital Stay: Payer: 59 | Attending: Hematology

## 2023-08-04 ENCOUNTER — Ambulatory Visit (HOSPITAL_COMMUNITY)
Admission: RE | Admit: 2023-08-04 | Discharge: 2023-08-04 | Disposition: A | Discharge status: Home / Self Care | Payer: 59 | Source: Ambulatory Visit | Attending: Hematology | Admitting: Hematology

## 2023-08-04 DIAGNOSIS — R7989 Other specified abnormal findings of blood chemistry: Secondary | ICD-10-CM

## 2023-08-04 DIAGNOSIS — E559 Vitamin D deficiency, unspecified: Secondary | ICD-10-CM | POA: Diagnosis not present

## 2023-08-04 DIAGNOSIS — C3411 Malignant neoplasm of upper lobe, right bronchus or lung: Secondary | ICD-10-CM | POA: Insufficient documentation

## 2023-08-04 DIAGNOSIS — D509 Iron deficiency anemia, unspecified: Secondary | ICD-10-CM | POA: Diagnosis not present

## 2023-08-04 DIAGNOSIS — C3490 Malignant neoplasm of unspecified part of unspecified bronchus or lung: Secondary | ICD-10-CM | POA: Diagnosis not present

## 2023-08-04 LAB — HEPATIC FUNCTION PANEL
ALT: 59 U/L — ABNORMAL HIGH (ref 0–44)
AST: 54 U/L — ABNORMAL HIGH (ref 15–41)
Albumin: 4.3 g/dL (ref 3.5–5.0)
Alkaline Phosphatase: 103 U/L (ref 38–126)
Bilirubin, Direct: <0.1 mg/dL (ref 0.0–0.2)
Total Bilirubin: 0.5 mg/dL (ref 0.3–1.2)
Total Protein: 7.7 g/dL (ref 6.5–8.1)

## 2023-08-04 MED ORDER — FLUDEOXYGLUCOSE F - 18 (FDG) INJECTION
6.8300 | Freq: Once | INTRAVENOUS | Status: AC | PRN
  Administered 2023-08-04: 6.83 via INTRAVENOUS

## 2023-08-08 ENCOUNTER — Encounter: Payer: Self-pay | Admitting: Pulmonary Disease

## 2023-08-08 ENCOUNTER — Ambulatory Visit: Payer: 59 | Admitting: Pulmonary Disease

## 2023-08-08 VITALS — BP 119/82 | HR 89 | Ht 60.0 in | Wt 128.0 lb

## 2023-08-08 DIAGNOSIS — J432 Centrilobular emphysema: Secondary | ICD-10-CM

## 2023-08-08 NOTE — Progress Notes (Signed)
[facility]  Chief Complaint  Patient presents with   Centrilobular emphysema     Constitutional:  BP 119/82   Pulse 89   Ht 5' (1.524 m)   Wt 128 lb (58.1 kg)   LMP 08/04/1985 (Approximate) Comment: hysterectomy @ 67 years old  SpO2 96%   BMI 25.00 kg/m   Past Medical History:  Anxiety, HTN, GERD, Headache, Pneumonia, NSCLC, Vit D defiency, Iron deficient anemia  Past Surgical History:  She  has a past surgical history that includes Abdominal hysterectomy; Appendectomy; Hernia repair; Tubal ligation; Endobronchial ultrasound (Bilateral, 08/23/2016); Portacath placement (Left, 09/17/2016); Carpal tunnel release (Bilateral); and Port-a-cath removal (Left, 11/02/2021).  Brief Summary:  Emily Pope is a 67 y.o. 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      Subjective:   She had CT chest in July.  Showed increased size in one of the left upper lobe nodules.  PET scan from 08/04/23 results are still pending.  She wasn't able to take biaxin.  Was told it would interact with her other medications.  She went to urgent care and got a different antibiotic and prednisone.  She gets a band like tightness in her chest when she is coming down with pneumonia.  Not feeling that now.  She has been getting winded with any activity.  She has checked her oxygen at home with activity and it drops.  She has a cough with sputum, and sometimes has some blood streaking.  Not having leg swelling or chest pain.  No fever.  Uses albuterol several times per day.  Feels like trelegy still helps and is easy to use.  She maintained SpO2 > 97% while walking on room air in office today.  Physical Exam:   Appearance - well kempt   ENMT - no sinus tenderness, no oral exudate, no LAN, Mallampati 3 airway, no stridor  Respiratory - decreased breath sounds, prolonged exhalation, no wheeze  CV - s1s2 regular  rate and rhythm, no murmurs  Ext - no clubbing, no edema  Skin - no rashes  Psych - normal mood and affect       Pulmonary testing:  PFT 09/16/17 >> FEV1 1.79 (82%), FEV1% 89, TLC 3.69 (82%), DLCO 55% PFT 02/22/23 >> FEV1 1.95 (96%), FEV1% 80, TLC 4.83 (108%), RV 2.54 (133%), DLCO 52%  Chest Imaging:  CT chest 03/17/21 >> radiation fibrosis changes RUL with 3.6 x 2.8 calcified mass, b/l paramedian radiation fibrosis, moderate centrilobular emphysema, RLL nodule 7 mm, scattered b/l lower lobe GGO CT chest 03/31/23 >> small HH, b/l paramediastinal XRT changes, calcified mass Rt lung apex, new 1.4 x 0.8 cm LU nodule, stable 8 mm RLL nodule, stable 5 mm LUL and 5 mm RLL nodule, consolidation in lower lobes, moderate centrilobular emphysema, fatty liver CT chest 07/01/23 >> LUL nodule 1.4 x 0.5 cm, LUL nodule 1.6 x 0.7 cm (was 0.5 cm), small new area of consolidation RLL  Cardiac Tests:  Echo 03/15/18 >> EF 60 to 65%, grade 1 DD  Social History:  She  reports that she quit smoking about 7 years ago. Her smoking use included cigarettes. She started smoking about 50 years ago. She has a 86 pack-year smoking history. She has never used smokeless tobacco. She reports that she does not drink alcohol and does not use drugs.  Family History:  Her family history includes Diabetes in her father; Hypertension in her father,  mother, sister, and sister; Lung cancer in her brother and mother.     Assessment/Plan:   Obstructive lung disease. - singulair caused daytime sleepiness and increased blood pressure - likely allergic asthma/chronic bronchitis with COPD and emphysema - continue trelegy 100 one puff daily - mucinex/robitussion, albuterol, flutter valve bid - she has a nebulizer  Perennial allergic rhinitis. - she is intolerant of nasal steroid sprays due to smell and taste - continue xyzal  Recurrent pneumonia. 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 Ellin Saba with Oncology - CT chest from July 2024 showed increased size of Lt upper lobe nodule - results of PET scan from 08/04/23 still pending  Dyspnea on exertion with coronary artery calcification on CT chest. - if this progresses, then might need further cardiac assessment - advised her to d/w her PCP  Time Spent Involved in Patient Care on Day of Examination:  36 minutes  Follow up:   Patient Instructions  Follow up in 4 months  Medication List:   Allergies as of 08/08/2023       Reactions   Clindamycin/lincomycin Rash   Codeine Anaphylaxis   Lincomycin Rash   Also Clindamycin as it has lincomycin in it Also Clindamycin as it has lincomycin in it   Other Rash   Penicillins Anaphylaxis   Has patient had a PCN reaction causing immediate rash, facial/tongue/throat swelling, SOB or lightheadedness with hypotension: Unknown Has patient had a PCN reaction causing severe rash involving mucus membranes or skin necrosis: Unknown Has patient had a PCN reaction that required hospitalization: No Has patient had a PCN reaction occurring within the last 10 years: No If all of the above answers are "NO", then may proceed with Cephalosporin use. Has patient had a PCN reaction causing immediate rash, facial/tongue/throat swelling, SOB or lightheadedness with hypotension: Unknown Has patient had a PCN reaction causing severe rash involving mucus membranes or skin necrosis: Unknown Has patient had a PCN reaction that required hospitalization: No Has patient had a PCN reaction occurring within the last 10 years: No If all of the above answers are "NO", then may  proceed with Cephalosporin use.   Vancomycin Itching   Scalp itching   Sulfa Antibiotics Nausea Only   Hydrocodone Rash   Tizanidine    Other Reaction(s): Other (See Comments) Dry mouth        Medication List        Accurate as of August 08, 2023  8:47 AM. If you have any questions, ask your nurse or doctor.          STOP taking these medications    clarithromycin 500 MG tablet Commonly known as: BIAXIN Stopped by: Coralyn Helling   predniSONE 20 MG tablet Commonly known as: DELTASONE Stopped by: Coralyn Helling       TAKE these medications    albuterol 108 (90 Base) MCG/ACT inhaler Commonly known as: VENTOLIN HFA Inhale 2 puffs into the lungs every 6 (six) hours as needed for wheezing or shortness of breath.   albuterol (2.5 MG/3ML) 0.083% nebulizer solution Commonly known as: PROVENTIL Take 3 mLs (2.5 mg total) by nebulization every 6 (six) hours as  needed for wheezing or shortness of breath.   ALPRAZolam 0.25 MG tablet Commonly known as: XANAX Take 0.25 mg by mouth daily.   Aquoral Soln by Transmucosal route.   aspirin EC 81 MG tablet Take 1 tablet by mouth daily.   buPROPion 300 MG 24 hr tablet Commonly known as: WELLBUTRIN XL Take 300 mg by mouth daily.   Cyanocobalamin 5000 MCG Tbdp Dissolve in the mouth.   cyclobenzaprine 10 MG tablet Commonly known as: FLEXERIL Take 5-10 mg by mouth every 8 (eight) hours as needed.   docusate sodium 100 MG capsule Commonly known as: COLACE Take 100 mg by mouth in the morning.   hydrochlorothiazide 12.5 MG tablet Commonly known as: HYDRODIURIL Take 12.5 mg by mouth daily as needed (if blood pressure is greater than 128/82).   levocetirizine 5 MG tablet Commonly known as: XYZAL Take 5 mg by mouth every evening.   losartan 50 MG tablet Commonly known as: COZAAR Take 50 mg by mouth daily as needed (if blood pressure is greater than 128/82).   Melatonin 10 MG Tabs Take 10 mg by mouth at bedtime.    nabumetone 500 MG tablet Commonly known as: RELAFEN   naproxen sodium 220 MG tablet Commonly known as: ALEVE Take 220-440 mg by mouth 2 (two) times daily as needed (pain.).   nystatin 100000 UNIT/ML suspension Commonly known as: MYCOSTATIN Take by mouth.   propranolol 10 MG tablet Commonly known as: INDERAL   rosuvastatin 5 MG tablet Commonly known as: CRESTOR   tiZANidine 4 MG tablet Commonly known as: ZANAFLEX   traMADol 50 MG tablet Commonly known as: ULTRAM Take 50 mg by mouth every 6 (six) hours as needed.   Trelegy Ellipta 100-62.5-25 MCG/INH Aepb Generic drug: Fluticasone-Umeclidin-Vilant Take 1 puff by mouth in the morning.   Veozah 45 MG Tabs Generic drug: Fezolinetant Take 1 tablet by mouth daily.   Vitamin D (Ergocalciferol) 1.25 MG (50000 UNIT) Caps capsule Commonly known as: DRISDOL Take by mouth.        Signature:  Coralyn Helling, MD [facility] Pager - [phone] 08/08/2023, 8:47 AM

## 2023-08-08 NOTE — Patient Instructions (Signed)
Follow up in 4 months 

## 2023-08-09 NOTE — Progress Notes (Signed)
[facility] [address] [place] [address]    Clinic Day:  09/13/23    Referring physician: Ignatius Specking, MD  Patient Care Team: Ignatius Specking, MD as PCP - General (Internal Medicine) Jena Gauss Gerrit Friends, MD as Consulting Physician (Gastroenterology)   ASSESSMENT & PLAN:   Assessment: 1.  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 -Durvalumab consolidation from 01/25/2017, discontinued on 05/09/2017 secondary to pneumonitis. -PET scan on 11/20/2018 showed further reduction in activity associated with the right apical consolidation, likely treatment related, without current evidence of malignancy.  Chronically stable 7 mm nodule in the right lateral costophrenic angle without current hypermetabolic activity. -CT of the chest on 06/01/2019 which showed treatment effect with the right upper lobe with no evidence of local recurrence or residual disease. -CT chest 12/11/2019 showed no changes in right apical consolidation.  A new 3 mm left upper lobe nodule is new since the prior exam.  If new left upper lobe nodule has increased in size this would warrant a PET CT followed by possible biopsy if FDG avid. -CT of the chest done on 06/09/2020 showed stable appearance of posttreatment changes involving the right upper lobe with compensatory hyperinflation of the right middle lobe and right lower lobe.  Stable subpleural nodules in the right lower lobe and 3 mm left upper lobe lung nodule.  Previously noted 5 mm  peribronchovascular nodule now measures 3 mm and is likely postinflammatory. -CT chest on 01/22/2021 at [facility] showed new bilateral lower lobe airspace opacities concerning for pneumonia.  Trace bilateral effusions.  No evidence  of pulmonary embolism.  Postradiation changes in the right upper lobe, unchanged.   2.  COPD/pneumonitis: -She is using albuterol and Trelegy.   3.  Vitamin D deficiency: -Labs done on 06/09/2020 showed vitamin D level 22.66. -She stopped taking the combination vitamin D and calcium due to constipation. -She will start taking an vitamin D3 by itself. -We will recheck her labs at her next visit.   4.  Iron deficiency anemia: -She is taking oral iron tablets daily. -Labs done on 06/09/2020 showed hemoglobin 12.4.   Plan: 1.  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  Previously dominant left upper lobe nodule measures 14 x 5, previously 14 x 8 mm. - She coughed up bloody mucus yesterday.  She has thrush from Trelegy. - We reviewed PET scan from 08/04/2023: Left upper lobe pulmonary nodule of concern does not have significant metabolic activity.  Favor focus of inflammation/infection.  Stable right suprahilar lung consolidation with calcification.  Mild metabolic activity in the hilar nodes and subcarinal nodes favor reactive activity.  No metastatic disease in the abdomen or pelvis. - Recommend follow-up in 6 months with repeat CT chest with contrast.  Orders Placed This Encounter  Procedures   CT Chest W Contrast    Standing Status:   Future    Standing Expiration Date:   08/09/2024    Order Specific Question:   If indicated for the ordered procedure, I authorize the administration of contrast media per Radiology protocol    Answer:   Yes    Order Specific Question:  Does the patient have a contrast media/X-ray dye allergy?    Answer:   No    Order Specific Question:   Preferred imaging location?    Answer:   [facility]   CBC with Differential    Standing Status:   Future    Standing Expiration Date:   08/09/2024   Comprehensive metabolic panel     Standing Status:   Future    Standing Expiration Date:   08/09/2024       [name],acting as a scribe for [name], MD.,have documented all relevant documentation on the behalf of [name], MD,as directed by  [name], MD while in the presence of [name], MD.  I, [name] MD, have reviewed the above documentation for accuracy and completeness, and I agree with the above.     [name], MD   9/17/20246:02 PM  CHIEF COMPLAINT:   Diagnosis: right lung cancer    Cancer Staging  Primary cancer of right upper lobe of lung [facility]) Staging form: Lung, AJCC 7th Edition - Clinical stage from 09/27/2016: Stage IIIB (T4, N3, M0) - Signed by Ellouise Newer, PA-C on 09/27/2016    Prior Therapy: 1. Chemoradiation with carboplatin and paclitaxel from 09/20/2016 to 10/25/2016 with consolidation to 12/06/2016. 2. Durvalumab consolidation from 01/25/2017 to 05/09/2017.  Current Therapy:  surveillance    HISTORY OF PRESENT ILLNESS:   Oncology History Overview Note  Stage IIIB (T4 N3 M0) carcinoma of RUL of lung, immunophenotyping consistent with adenocarcinoma.  S/P concomitant chemoradiation with carboplatin/paclitaxel (09/20/2016- 10/25/2016) followed by 2 cycle of consolidative chemotherapy with carboplatin/paclitaxel (11/15/2016- 12/06/2016).  Unable to tolerate consolidative durvalumab (Imfinzi) beginning on 01/26/2016 and stopped 05/2017.   AND Imfinzi-induced pneumonitis, grade 2, resulting in holding Imfinzi from 04/25/2017- 05/09/2017 with corticosteroids treatment with taper.  This resolved/improved with steroids. Developed pnemonitis again after rechallenge with imfinzi. On steroid taper again.     Primary cancer of right upper lobe of lung (HCC)  08/04/2016 PET scan   PET IMPRESSION: 1. Intensely hypermetabolic 7.9 cm central right upper lobe lung mass, highly suggestive of a primary bronchogenic mucinous  carcinoma given the extensive amorphous internal calcifications. Mass is confluent with the right superior hilum and right lower tracheal wall, suggestive of a T4 primary tumor. 2. Postobstructive pneumonia in the right upper lobe. 3. Hypermetabolic ipsilateral and contralateral mediastinal and contralateral hilar lymphadenopathy, suggestive of N3 nodal disease.   08/24/2016 Procedure   Bronchoscopy   08/25/2016 Pathology Results   Diagnosis Endobronchial biopsy, RUL - POORLY DIFFERENTIATED NON-SMALL CELL CARCINOMA. The immunophenotype is consistent with poorly differentiated adenocarcinoma.   09/10/2016 Imaging   MRI brain- No metastatic disease or acute intracranial abnormality.   09/20/2016 - 10/25/2016 Chemotherapy   Carboplatin/Paclitaxel weekly x 6 with XRT.   09/20/2016 - 10/25/2016 Radiation Therapy     11/15/2016 - 12/06/2016 Chemotherapy   Carboplatin/paclitaxel every 21 days x 2 cycles.   12/30/2016 Imaging   CT CAP- 1. Central right upper lobe lung mass has mildly decreased since 11/21/2016 chest CT angiogram and significantly decreased since 08/04/2016 PET-CT. 2. No pathologically enlarged thoracic lymph nodes. Previously described hypermetabolic thoracic adenopathy on the 08/04/2016 PET-CT has decreased in size. 3. Previously described bilateral lower lobe pulmonary nodules are stable in size. 4. New solitary subsolid 4 mm right middle lobe pulmonary nodule is nonspecific, and may be inflammatory. Recommend attention on a follow-up chest CT in 3 months. 5. No definite metastatic disease in the abdomen. Tiny sub  5 mm low-attenuation lesions in the liver are too small to characterize, favor benign. 6. Aortic atherosclerosis.  One vessel coronary atherosclerosis. 7. Moderate emphysema.   01/25/2017 -  Chemotherapy   Durvalumab (Imfinzi) x 52 weeks.    03/31/2017 Imaging   CT chest with contrast: Stable size of partially calcified mass in the posterior right  upper lobe.   Increased airspace disease in paramediastinal right upper and superior right lower lobes. Differential diagnosis includes radiation pneumonitis, drug reaction, and infection.   Stable sub-cm right lower lobe pulmonary nodule.   No evidence of lymphadenopathy or pleural effusion.   Emphysema.   Aortic and coronary artery atherosclerosis.   04/25/2017 Adverse Reaction   Progressive cough, concerning for pneumonitis.   04/25/2017 Treatment Plan Change   Progressive cough concerning for pneumonitis.  Treatment held.  Managed by steroids.   05/09/2017 Treatment Plan Change   Restart Imfinzi with improvement of cough, Grade 1.   05/27/2017 Imaging   CT CHEST WITH CONTRAST IMPRESSION: 1. Similar size of partially calcified posterior right apical lung mass. 2. Similar to minimal increase in surrounding radiation fibrosis. 3. Subtle areas of mosaic attenuation are greater on the right. Likely attributed to mild ground-glass opacity. In the appropriate clinical setting, this could represent drug toxicity induced pneumonitis. 4.  Coronary artery atherosclerosis. Aortic atherosclerosis. 5. Similar right lower lobe pulmonary nodule.   05/27/2017 Adverse Reaction   Developed worsening cough and SOB again after imfinzi. Grade 2. Placed on long steroid taper.  Permanently discontinue any further imfinzi treatments.   07/29/2017 Imaging   CT chest/abd/pelvis: IMPRESSION: 1. Stable post treatment related changes of mass-like radiation fibrosis in the upper right lung with similar appearance of densely calcified mass in the apex of the right upper lobe. No finding to suggest local recurrence of disease or metastatic disease in the chest, abdomen or pelvis on today's examination. 2. 7 mm subpleural nodule in the periphery of the right lower lobe is stable and favored to be benign. Continued attention on followup studies is recommended to ensure stability. 3. Aortic  atherosclerosis, in addition to left anterior descending coronary artery disease. Please note that although the presence of coronary artery calcium documents the presence of coronary artery disease, the severity of this disease and any potential stenosis cannot be assessed on this non-gated CT examination. Assessment for potential risk factor modification, dietary therapy or pharmacologic therapy may be warranted, if clinically indicated. 4. There are calcifications of the mitral valve. Echocardiographic correlation for evaluation of potential valvular dysfunction may be warranted if clinically indicated. 5. Mild diffuse bronchial wall thickening with mild to moderate centrilobular and mild paraseptal emphysema; imaging findings suggestive of underlying COPD. 6. Tip of left subclavian Port-A-Cath is now misdirected into the subclavian vein.      INTERVAL HISTORY:   Emily Pope is a 67 y.o. female presenting to clinic today for follow up of right lung cancer. She was last seen by me on 07/07/23.  Since her last visit, she underwent restaging PET on 08/04/23 that found: LEFT upper lobe pulmonary nodule of concern does not have significant metabolic activity; stable RIGHT suprahilar lung consolidation with calcification; mild metabolic activity associated with hilar lymph nodes and subcarinal nodes favor reactive activity; and no evidence of metastatic disease in the abdomen pelvis.   Today, she states that she is doing well overall. Her appetite level is at 80%. Her energy level is at 0%.  She notes yesterday she had 2 instances of hemoptysis with mucus. She  notes she had thrush recently due to being on Trelegy. She denies epistaxis or taking blood thinners. Her pulmonologist reportedly said it may be due to irritation from the lungs. She notes she has quit smoking since September of 2017.   PAST MEDICAL HISTORY:   Past Medical History: Past Medical History:  Diagnosis Date   Anxiety     Arthritis    Chronic bronchitis (HCC)    COPD (chronic obstructive pulmonary disease) (HCC)    Essential hypertension    GERD (gastroesophageal reflux disease)    Headache    History of pneumonia    Pneumonitis    Primary cancer of right upper lobe of lung (HCC)    Stage IIIb adenocarcinoma    Surgical History: Past Surgical History:  Procedure Laterality Date   ABDOMINAL HYSTERECTOMY     APPENDECTOMY     when had hysterectomy   CARPAL TUNNEL RELEASE Bilateral    ENDOBRONCHIAL ULTRASOUND Bilateral 08/23/2016   Procedure: ENDOBRONCHIAL ULTRASOUND;  Surgeon: Leslye Peer, MD;  Location: [facility];  Service: Cardiopulmonary;  Laterality: Bilateral;   HERNIA REPAIR     left inguinal hernia age 85   PORT-A-CATH REMOVAL Left 11/02/2021   Procedure: MINOR REMOVAL PORT-A-CATH;  Surgeon: Franky Macho, MD;  Location: [facility];  Service: General;  Laterality: Left;  pt to arrive at 8:30   PORTACATH PLACEMENT Left 09/17/2016   Procedure: INSERTION PORT-A-CATH LEFT SUBCLAVIAN;  Surgeon: Franky Macho, MD;  Location: [facility];  Service: General;  Laterality: Left;   TUBAL LIGATION      Social History: Social History   Socioeconomic History   Marital status: Divorced    Spouse name: Not on file   Number of children: Not on file   Years of education: Not on file   Highest education level: Not on file  Occupational History   Not on file  Tobacco Use   Smoking status: Former    Current packs/day: 0.00    Average packs/day: 2.0 packs/day for 43.0 years (86.0 ttl pk-yrs)    Types: Cigarettes    Start date: 07/20/1973    Quit date: 07/20/2016    Years since quitting: 7.1   Smokeless tobacco: Never  Vaping Use   Vaping status: Never Used  Substance and Sexual Activity   Alcohol use: No   Drug use: No   Sexual activity: Not on file  Other Topics Concern   Not on file  Social History Narrative   Not on file   Social Determinants of Health   Financial Resource Strain: Low Risk   (06/24/2021)   Overall Financial Resource Strain (CARDIA)    Difficulty of Paying Living Expenses: Not hard at all  Food Insecurity: No Food Insecurity (06/24/2021)   Hunger Vital Sign    Worried About Running Out of Food in the Last Year: Never true    Ran Out of Food in the Last Year: Never true  Transportation Needs: No Transportation Needs (06/24/2021)   PRAPARE - Administrator, Civil Service (Medical): No    Lack of Transportation (Non-Medical): No  Physical Activity: Insufficiently Active (06/24/2021)   Exercise Vital Sign    Days of Exercise per Week: 7 days    Minutes of Exercise per Session: 10 min  Stress: No Stress Concern Present (06/24/2021)   [employer] of Occupational Health - Occupational Stress Questionnaire    Feeling of Stress : Not at all  Social Connections: Moderately Isolated (06/24/2021)   Social Connection and Isolation  Panel [NHANES]    Frequency of Communication with Friends and Family: More than three times a week    Frequency of Social Gatherings with Friends and Family: Once a week    Attends Religious Services: Never    Database administrator or Organizations: No    Attends Banker Meetings: Never    Marital Status: Married  Catering manager Violence: Not At Risk (05/02/2023)   Received from [facility], [facility]   Humiliation, Afraid, Rape, and Kick questionnaire    Fear of Current or Ex-Partner: No    Emotionally Abused: No    Physically Abused: No    Sexually Abused: No    Family History: Family History  Problem Relation Age of Onset   Hypertension Mother    Lung cancer Mother    Hypertension Father    Diabetes Father    Hypertension Sister    Hypertension Sister    Lung cancer Brother    Colon cancer Neg Hx    Breast cancer Neg Hx     Current Medications:  Current Outpatient Medications:    albuterol (PROVENTIL HFA;VENTOLIN HFA) 108 (90 Base) MCG/ACT inhaler, Inhale 2 puffs into the lungs every 6  (six) hours as needed for wheezing or shortness of breath., Disp: 1 Inhaler, Rfl: 6   albuterol (PROVENTIL) (2.5 MG/3ML) 0.083% nebulizer solution, Take 3 mLs (2.5 mg total) by nebulization every 6 (six) hours as needed for wheezing or shortness of breath., Disp: 360 mL, Rfl: 5   ALPRAZolam (XANAX) 0.25 MG tablet, Take 0.25 mg by mouth daily., Disp: , Rfl:    Artificial Saliva (AQUORAL) SOLN, by Transmucosal route., Disp: , Rfl:    aspirin EC 81 MG tablet, Take 1 tablet by mouth daily., Disp: , Rfl:    buPROPion (WELLBUTRIN XL) 300 MG 24 hr tablet, Take 300 mg by mouth daily., Disp: , Rfl:    Cyanocobalamin 5000 MCG TBDP, Dissolve in the mouth., Disp: , Rfl:    docusate sodium (COLACE) 100 MG capsule, Take 100 mg by mouth in the morning., Disp: , Rfl:    hydrochlorothiazide (HYDRODIURIL) 12.5 MG tablet, Take 12.5 mg by mouth daily as needed (if blood pressure is greater than 128/82)., Disp: , Rfl: 0   levocetirizine (XYZAL) 5 MG tablet, Take 5 mg by mouth every evening., Disp: , Rfl:    losartan (COZAAR) 50 MG tablet, Take 50 mg by mouth daily as needed (if blood pressure is greater than 128/82)., Disp: , Rfl: 0   Melatonin 10 MG TABS, Take 10 mg by mouth at bedtime., Disp: , Rfl:    naproxen sodium (ALEVE) 220 MG tablet, Take 220-440 mg by mouth 2 (two) times daily as needed (pain.)., Disp: , Rfl:    traMADol (ULTRAM) 50 MG tablet, Take 50 mg by mouth every 6 (six) hours as needed., Disp: , Rfl:    VEOZAH 45 MG TABS, Take 1 tablet by mouth daily., Disp: , Rfl:    Vitamin D, Ergocalciferol, (DRISDOL) 1.25 MG (50000 UNIT) CAPS capsule, Take by mouth., Disp: , Rfl:    famotidine (PEPCID) 20 MG tablet, One after supper, Disp: 30 tablet, Rfl: 11   pantoprazole (PROTONIX) 40 MG tablet, Take 1 tablet (40 mg total) by mouth daily. Take 30-60 min before first meal of the day, Disp: 30 tablet, Rfl: 2   predniSONE (DELTASONE) 10 MG tablet, Take  4 each am x 2 days,   2 each am x 2 days,  1 each  am x 2 days  and stop, Disp: 14 tablet, Rfl: 0   Allergies: Allergies  Allergen Reactions   Clindamycin/Lincomycin Rash   Codeine Anaphylaxis   Lincomycin Rash    Also Clindamycin as it has lincomycin in it Also Clindamycin as it has lincomycin in it   Other Rash   Penicillins Anaphylaxis    Has patient had a PCN reaction causing immediate rash, facial/tongue/throat swelling, SOB or lightheadedness with hypotension: Unknown Has patient had a PCN reaction causing severe rash involving mucus membranes or skin necrosis: Unknown Has patient had a PCN reaction that required hospitalization: No Has patient had a PCN reaction occurring within the last 10 years: No If all of the above answers are "NO", then may proceed with Cephalosporin use. Has patient had a PCN reaction causing immediate rash, facial/tongue/throat swelling, SOB or lightheadedness with hypotension: Unknown Has patient had a PCN reaction causing severe rash involving mucus membranes or skin necrosis: Unknown Has patient had a PCN reaction that required hospitalization: No Has patient had a PCN reaction occurring within the last 10 years: No If all of the above answers are "NO", then may proceed with Cephalosporin use.    Vancomycin Itching    Scalp itching   Sulfa Antibiotics Nausea Only   Hydrocodone Rash   Tizanidine     Other Reaction(s): Other (See Comments)  Dry mouth    REVIEW OF SYSTEMS:   Review of Systems  Constitutional:  Negative for chills, fatigue and fever.  HENT:   Negative for lump/mass, mouth sores, nosebleeds, sore throat and trouble swallowing.   Eyes:  Negative for eye problems.  Respiratory:  Positive for cough and shortness of breath.   Cardiovascular:  Negative for chest pain, leg swelling and palpitations.  Gastrointestinal:  Negative for abdominal pain, constipation, diarrhea, nausea and vomiting.       +hemoptysis  Genitourinary:  Negative for bladder incontinence, difficulty urinating, dysuria,  frequency, hematuria and nocturia.   Musculoskeletal:  Positive for neck pain (6/10 severity). Negative for arthralgias, back pain, flank pain and myalgias.  Skin:  Negative for itching and rash.  Neurological:  Negative for dizziness, headaches and numbness.  Hematological:  Does not bruise/bleed easily.  Psychiatric/Behavioral:  Negative for depression, sleep disturbance and suicidal ideas. The patient is nervous/anxious.   All other systems reviewed and are negative.    VITALS:   Blood pressure (!) 153/96, pulse 92, temperature 98.2 F (36.8 C), temperature source Oral, resp. rate 20, weight 128 lb (58.1 kg), last menstrual period 08/04/1985, SpO2 98%.  Wt Readings from Last 3 Encounters:  09/12/23 122 lb (55.3 kg)  08/10/23 128 lb (58.1 kg)  08/08/23 128 lb (58.1 kg)    Body mass index is 25 kg/m.  Performance status (ECOG): 1 - Symptomatic but completely ambulatory  PHYSICAL EXAM:   Physical Exam Vitals and nursing note reviewed. Exam conducted with a chaperone present.  Constitutional:      Appearance: Normal appearance.  Cardiovascular:     Rate and Rhythm: Normal rate and regular rhythm.     Pulses: Normal pulses.     Heart sounds: Normal heart sounds.  Pulmonary:     Effort: Pulmonary effort is normal.     Breath sounds: Normal breath sounds.  Abdominal:     Palpations: Abdomen is soft. There is no hepatomegaly, splenomegaly or mass.     Tenderness: There is no abdominal tenderness.  Musculoskeletal:     Right lower leg: No edema.  Left lower leg: No edema.  Lymphadenopathy:     Cervical: No cervical adenopathy.     Right cervical: No superficial, deep or posterior cervical adenopathy.    Left cervical: No superficial, deep or posterior cervical adenopathy.     Upper Body:     Right upper body: No supraclavicular or axillary adenopathy.     Left upper body: No supraclavicular or axillary adenopathy.  Neurological:     General: No focal deficit present.      Mental Status: She is alert and oriented to person, place, and time.  Psychiatric:        Mood and Affect: Mood normal.        Behavior: Behavior normal.     LABS:      Latest Ref Rng & Units 06/29/2023    8:44 AM 03/31/2023    9:55 AM 09/30/2022    8:43 AM  CBC  WBC 4.0 - 10.5 K/uL 6.4  5.9  4.8   Hemoglobin 12.0 - 15.0 g/dL 86.5  78.4  69.6   Hematocrit 36.0 - 46.0 % 39.3  42.7  38.6   Platelets 150 - 400 K/uL 339  321  305       Latest Ref Rng & Units 08/04/2023    8:10 AM 06/29/2023    8:44 AM 03/31/2023    9:55 AM  CMP  Glucose 70 - 99 mg/dL  83  91   BUN 8 - 23 mg/dL  15  14   Creatinine 2.95 - 1.00 mg/dL  2.84  1.32   Sodium 440 - 145 mmol/L  137  134   Potassium 3.5 - 5.1 mmol/L  3.4  4.1   Chloride 98 - 111 mmol/L  101  98   CO2 22 - 32 mmol/L  25  26   Calcium 8.9 - 10.3 mg/dL  9.2  9.2   Total Protein 6.5 - 8.1 g/dL 7.7  7.7  7.3   Total Bilirubin 0.3 - 1.2 mg/dL 0.5  0.5  0.4   Alkaline Phos 38 - 126 U/L 103  117  101   AST 15 - 41 U/L 54  43  27   ALT 0 - 44 U/L 59  48  20      No results found for: "CEA1", "CEA" / No results found for: "CEA1", "CEA" No results found for: "PSA1" No results found for: "NUU725" No results found for: "CAN125"  No results found for: "TOTALPROTELP", "ALBUMINELP", "A1GS", "A2GS", "BETS", "BETA2SER", "GAMS", "MSPIKE", "SPEI" Lab Results  Component Value Date   TIBC 319 09/24/2021   TIBC 314 06/17/2021   TIBC 311 03/13/2021   FERRITIN 59 09/24/2021   FERRITIN 65 06/17/2021   FERRITIN 68 03/13/2021   IRONPCTSAT 26 09/24/2021   IRONPCTSAT 33 (H) 06/17/2021   IRONPCTSAT 27 03/13/2021   Lab Results  Component Value Date   LDH 167 09/24/2021   LDH 135 03/13/2021   LDH 162 12/08/2020     STUDIES:   No results found.

## 2023-08-10 ENCOUNTER — Inpatient Hospital Stay (HOSPITAL_BASED_OUTPATIENT_CLINIC_OR_DEPARTMENT_OTHER): Payer: 59 | Admitting: Hematology

## 2023-08-10 VITALS — BP 153/96 | HR 92 | Temp 98.2°F | Resp 20 | Wt 128.0 lb

## 2023-08-10 DIAGNOSIS — C3411 Malignant neoplasm of upper lobe, right bronchus or lung: Secondary | ICD-10-CM

## 2023-08-10 DIAGNOSIS — E559 Vitamin D deficiency, unspecified: Secondary | ICD-10-CM | POA: Diagnosis not present

## 2023-08-10 DIAGNOSIS — D509 Iron deficiency anemia, unspecified: Secondary | ICD-10-CM | POA: Diagnosis not present

## 2023-08-10 NOTE — Patient Instructions (Signed)
[facility] at [facility] Discharge Instructions   You were seen and examined today by Dr. Ellin Saba.  He reviewed the results of your PET scan which does not show any evidence of cancer recurrence.   We will see you back in 6 months. We will repeat a CT scan prior to your next visit.  Return as scheduled.   Thank you for choosing [facility] at [facility] to provide your oncology and hematology care.  To afford each patient quality time with our provider, please arrive at least 15 minutes before your scheduled appointment time.   If you have a lab appointment with the Cancer Center please come in thru the Main Entrance and check in at the main information desk.  You need to re-schedule your appointment should you arrive 10 or more minutes late.  We strive to give you quality time with our providers, and arriving late affects you and other patients whose appointments are after yours.  Also, if you no show three or more times for appointments you may be dismissed from the clinic at the providers discretion.     Again, thank you for choosing [facility].  Our hope is that these requests will decrease the amount of time that you wait before being seen by our physicians.       _____________________________________________________________  Should you have questions after your visit to [facility], please contact our office at [phone] and follow the prompts.  Our office hours are 8:00 a.m. and 4:30 p.m. Monday - Friday.  Please note that voicemails left after 4:00 p.m. may not be returned until the following business day.  We are closed weekends and major holidays.  You do have access to a nurse 24-7, just call the main number to the clinic [phone] and do not press any options, hold on the line and a nurse will answer the phone.    For prescription refill requests, have your pharmacy contact our office and allow 72  hours.    Due to Covid, you will need to wear a mask upon entering the hospital. If you do not have a mask, a mask will be given to you at the Main Entrance upon arrival. For doctor visits, patients may have 1 support person age 71 or older with them. For treatment visits, patients can not have anyone with them due to social distancing guidelines and our immunocompromised population.

## 2023-08-12 DIAGNOSIS — I10 Essential (primary) hypertension: Secondary | ICD-10-CM | POA: Diagnosis not present

## 2023-08-12 DIAGNOSIS — B370 Candidal stomatitis: Secondary | ICD-10-CM | POA: Diagnosis not present

## 2023-08-12 DIAGNOSIS — Z299 Encounter for prophylactic measures, unspecified: Secondary | ICD-10-CM | POA: Diagnosis not present

## 2023-08-12 DIAGNOSIS — K1321 Leukoplakia of oral mucosa, including tongue: Secondary | ICD-10-CM | POA: Diagnosis not present

## 2023-08-19 DIAGNOSIS — Z79899 Other long term (current) drug therapy: Secondary | ICD-10-CM | POA: Diagnosis not present

## 2023-08-19 DIAGNOSIS — Z13820 Encounter for screening for osteoporosis: Secondary | ICD-10-CM | POA: Diagnosis not present

## 2023-08-26 DIAGNOSIS — B370 Candidal stomatitis: Secondary | ICD-10-CM | POA: Diagnosis not present

## 2023-08-26 DIAGNOSIS — C3411 Malignant neoplasm of upper lobe, right bronchus or lung: Secondary | ICD-10-CM | POA: Diagnosis not present

## 2023-08-26 DIAGNOSIS — Z299 Encounter for prophylactic measures, unspecified: Secondary | ICD-10-CM | POA: Diagnosis not present

## 2023-08-26 DIAGNOSIS — I10 Essential (primary) hypertension: Secondary | ICD-10-CM | POA: Diagnosis not present

## 2023-08-26 DIAGNOSIS — D692 Other nonthrombocytopenic purpura: Secondary | ICD-10-CM | POA: Diagnosis not present

## 2023-09-08 ENCOUNTER — Telehealth: Payer: Self-pay | Admitting: Pulmonary Disease

## 2023-09-08 DIAGNOSIS — I10 Essential (primary) hypertension: Secondary | ICD-10-CM | POA: Diagnosis not present

## 2023-09-08 DIAGNOSIS — Z6823 Body mass index (BMI) 23.0-23.9, adult: Secondary | ICD-10-CM | POA: Diagnosis not present

## 2023-09-08 DIAGNOSIS — Z299 Encounter for prophylactic measures, unspecified: Secondary | ICD-10-CM | POA: Diagnosis not present

## 2023-09-08 DIAGNOSIS — R682 Dry mouth, unspecified: Secondary | ICD-10-CM | POA: Diagnosis not present

## 2023-09-08 DIAGNOSIS — J441 Chronic obstructive pulmonary disease with (acute) exacerbation: Secondary | ICD-10-CM | POA: Diagnosis not present

## 2023-09-08 NOTE — Telephone Encounter (Signed)
Patient states she is having a lot of problems with her mouth, lips numb, dry, throat is dry and scratchy. Patient saw her family doctor who states it could be form the Trelegy and albuterol. Please advise.

## 2023-09-09 NOTE — Telephone Encounter (Signed)
Called and spoke with pt letting her know the info per Dr. Craige Cotta and pt verbalized understanding. When trying to schedule pt an appt, pt told me that she needs to have an appt at the [place] office as she is not physicallly capable to come to the [facility] office. Pt said she is not able to do a mychart visit.  No openings with Dr. Craige Cotta at [facility] office and no openings anytime soon with Dr. [name] who will be taking over pt's care. Dr. [name], please advise if you would be okay with [place] adding pt in a slot or double booking pt on your schedule.

## 2023-09-09 NOTE — Telephone Encounter (Signed)
Patient checking on message for side effects from medicine. Patient phone number is [phone].

## 2023-09-09 NOTE — Telephone Encounter (Signed)
Called and spoke with pt about her symptoms. She has been on Trelegy for 2 years never had this problem.Pt has also has thrush twice in the last 2 months. Pt stated she still is using her trelegy, I have advised her to stop until we get Dr.Sood recommendations. Dr.Sood please advise

## 2023-09-09 NOTE — Telephone Encounter (Signed)
She should use albuterol as needed for now.  She needs to be scheduled for an ROV to discuss whether she needs to try a different inhaler.

## 2023-09-09 NOTE — Telephone Encounter (Signed)
15 min fine but must bring all meds with her to regroup

## 2023-09-09 NOTE — Telephone Encounter (Signed)
Called and spoke with pt and have scheduled her an appt with Dr. [name] at [place] office.

## 2023-09-11 DIAGNOSIS — J449 Chronic obstructive pulmonary disease, unspecified: Secondary | ICD-10-CM | POA: Insufficient documentation

## 2023-09-11 NOTE — Progress Notes (Unsigned)
Emily Pope, female    DOB: [birth year]    MRN: [id number]   Brief patient profile:  68  yowf  quit smoking 2017  self referred to pulmonary clinic in [facility]  09/12/2023 by for copd/ GOLD 0 Sood pt  PFT 02/22/23 >> FEV1 1.95 (96%), FEV1% 80, TLC 4.83 (108%), RV 2.54 (133%), DLCO 52% and  worse p SABA     History of Present Illness  09/12/2023  Pulmonary/ 1st office eval/ [name] / [place] Office off trelegy since 09/09/23  Chief Complaint  Patient presents with   Centrilobular emphysema   Dyspnea:  MMRC1 = can walk nl pace, flat grade, can't hurry or go uphills or steps s sob   Cough: sensation of chest congestion creamy x 2017 when finished RT Sleep: level bed /one pillow s resp wakes up dry and thirty  SABA use: once or twice daily  02: none   No obvious day to day or daytime pattern/variability or assoc excess/ purulent sputum or mucus plugs or hemoptysis or cp or chest tightness, subjective wheeze or overt sinus or hb symptoms.    Also denies any obvious fluctuation of symptoms with weather or environmental changes or other aggravating or alleviating factors except as outlined above   No unusual exposure hx or h/o childhood pna/ asthma or knowledge of premature birth.  Current Allergies, Complete Past Medical History, Past Surgical History, Family History, and Social History were reviewed in [employer] record.  ROS  The following are not active complaints unless bolded Hoarseness, sore throat, dysphagia, dental problems, itching, sneezing,  nasal congestion or discharge of excess mucus or purulent secretions, ear ache,   fever, chills, sweats, unintended wt loss or wt gain, classically pleuritic or exertional cp,  orthopnea pnd or arm/hand swelling  or leg swelling, presyncope, palpitations, abdominal pain, anorexia, nausea, vomiting, diarrhea  or change in bowel habits or change in bladder habits, change in stools or change in urine, dysuria,  hematuria,  rash, arthralgias, visual complaints, headache, numbness, weakness or ataxia or problems with walking or coordination,  change in mood or  memory.  Dry mouth and throat improving off trelegy               Outpatient Medications Prior to Visit  Medication Sig Dispense Refill   albuterol (PROVENTIL HFA;VENTOLIN HFA) 108 (90 Base) MCG/ACT inhaler Inhale 2 puffs into the lungs every 6 (six) hours as needed for wheezing or shortness of breath. 1 Inhaler 6   albuterol (PROVENTIL) (2.5 MG/3ML) 0.083% nebulizer solution Take 3 mLs (2.5 mg total) by nebulization every 6 (six) hours as needed for wheezing or shortness of breath. 360 mL 5   ALPRAZolam (XANAX) 0.25 MG tablet Take 0.25 mg by mouth daily.     Artificial Saliva (AQUORAL) SOLN by Transmucosal route.     aspirin EC 81 MG tablet Take 1 tablet by mouth daily.     buPROPion (WELLBUTRIN XL) 300 MG 24 hr tablet Take 300 mg by mouth daily.     Cyanocobalamin 5000 MCG TBDP Dissolve in the mouth.     docusate sodium (COLACE) 100 MG capsule Take 100 mg by mouth in the morning.     hydrochlorothiazide (HYDRODIURIL) 12.5 MG tablet Take 12.5 mg by mouth daily as needed (if blood pressure is greater than 128/82).  0   levocetirizine (XYZAL) 5 MG tablet Take 5 mg by mouth every evening.     losartan (COZAAR) 50 MG tablet Take 50 mg by mouth  daily as needed (if blood pressure is greater than 128/82).  0   Melatonin 10 MG TABS Take 10 mg by mouth at bedtime.     naproxen sodium (ALEVE) 220 MG tablet Take 220-440 mg by mouth 2 (two) times daily as needed (pain.).     traMADol (ULTRAM) 50 MG tablet Take 50 mg by mouth every 6 (six) hours as needed.     VEOZAH 45 MG TABS Take 1 tablet by mouth daily.     Vitamin D, Ergocalciferol, (DRISDOL) 1.25 MG (50000 UNIT) CAPS capsule Take by mouth.     cyclobenzaprine (FLEXERIL) 10 MG tablet Take 5-10 mg by mouth every 8 (eight) hours as needed.     nabumetone (RELAFEN) 500 MG tablet      nystatin  (MYCOSTATIN) 100000 UNIT/ML suspension Take by mouth.     propranolol (INDERAL) 10 MG tablet      tiZANidine (ZANAFLEX) 4 MG tablet      TRELEGY ELLIPTA 100-62.5-25 MCG/INH AEPB Take 1 puff by mouth in the morning.  0   No facility-administered medications prior to visit.    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      Objective:     BP (!) 148/88   Pulse 96   Ht 5' (1.524 m)   Wt 122 lb (55.3 kg)   LMP 08/04/1985 (Approximate) Comment: hysterectomy @ 67 years old  SpO2 96%   BMI 23.83 kg/m   SpO2: 96 % RA   Amb wf with  Raspy voice and classic pseudowheeze    HEENT : Oropharynx  clear/ no thrush or pnd/ poor dentition     Nasal turbinates nl    NECK :  without  apparent JVD/ palpable Nodes/TM    LUNGS: no acc muscle use,  Nl contour chest which is clear to A and P bilaterally without cough on insp or exp maneuvers   CV:  RRR  no s3 or murmur or increase in P2, and no edema   ABD:  soft and nontender with nl inspiratory excursion in the supine position. No bruits or organomegaly appreciated   MS:  Nl gait/ ext warm without deformities Or obvious joint restrictions  calf tenderness, cyanosis or clubbing    SKIN: warm and dry without lesions    NEURO:  alert, approp, nl sensorium with  no motor or cerebellar deficits apparent.         Assessment   Upper airway cough syndrome Onset ? 2023 / worse on trelegy 100 > d/c'd 09/09/23 -  max gerd rx 09/12/2023 >>>   Upper airway cough syndrome (previously labeled PNDS),  is so named because it's frequently impossible to sort out how much is  CR/sinusitis with freq throat clearing (which can be related to primary  GERD)   vs  causing  secondary (" extra esophageal")  GERD from wide swings in gastric pressure that occur with throat clearing, often  promoting self use of mint and menthol lozenges that reduce the lower esophageal sphincter tone and exacerbate the problem further in a cyclical fashion.   These are the same pts (now being  labeled as having "irritable larynx syndrome" by some cough centers) who not infrequently have a history of having failed to tolerate ace inhibitors,  dry powder inhalers or biphosphonates or report having atypical/extraesophageal reflux symptoms that don't respond to standard doses of PPI  and are easily confused as having aecopd or asthma flares by even experienced allergists/ pulmonologists (myself included).   >>>   Rec leave off trelegy (see COPD) and max gerd rx/sugarless candy to resist the urge to cough and For cough/ congestion > mucinex or mucinex dm  up to maximum of  1200 mg every 12 hours and use the flutter valve as much as you can    COPD GOLD 0 Quit smoking 2017 with dx of lung ca - PFT 02/22/23 >> FEV1 1.95 (96%), FEV1% 80, TLC 4.83 (108%), RV 2.54 (133%), DLCO 52%  worse p SABA and no curvature on inp/exp to indicate true airflow obstruction  - 09/12/2023  After extensive coaching inhaler device,  effectiveness =    80% with hfa so just rx as AB with albuterol more appropriately  09/12/2023   Walked on RA  x  3  lap(s) =  approx 450  ft  @ mod pace, stopped due to end of study with lowest 02 sats 96%  p saba prior   Re SABA :  I spent extra time with pt today reviewing appropriate use of albuterol for prn use on exertion with the following points: 1) saba is for relief of sob that does not improve by walking a slower pace or resting but rather if the pt does not improve after trying this first. 2) If the pt is convinced, as many are, that saba helps recover from activity faster then it's easy to tell if this is the case by re-challenging : ie stop, take the inhaler,  then p 5 minutes try the exact same activity (intensity of workload) that just caused the symptoms and see if they are substantially diminished or not after saba 3) if there is an activity that reproducibly causes the symptoms, try the saba 15 min before the activity on alternate days   If in fact the saba really does help, then fine to continue to use it prn but advised may need to look closer at the maintenance regimen being used to achieve better control of airways disease with exertion.   Each maintenance medication was reviewed in detail including emphasizing most importantly the difference between maintenance and prns and under what circumstances the prns are to be triggered using an action plan format where appropriate.  Total time for H and P, chart review, counseling, reviewing hfa device(s) , directly observing portions of ambulatory 02 saturation study/ and generating customized AVS unique to this office visit / same day charting = 40 min with pt new to me              Sandrea Hughs, MD 09/12/2023

## 2023-09-12 ENCOUNTER — Ambulatory Visit (INDEPENDENT_AMBULATORY_CARE_PROVIDER_SITE_OTHER): Payer: 59 | Admitting: Internal Medicine

## 2023-09-12 ENCOUNTER — Encounter: Payer: Self-pay | Admitting: Internal Medicine

## 2023-09-12 VITALS — BP 148/88 | HR 96 | Ht 60.0 in | Wt 122.0 lb

## 2023-09-12 DIAGNOSIS — J449 Chronic obstructive pulmonary disease, unspecified: Secondary | ICD-10-CM

## 2023-09-12 DIAGNOSIS — R058 Other specified cough: Secondary | ICD-10-CM | POA: Diagnosis not present

## 2023-09-12 MED ORDER — FAMOTIDINE 20 MG PO TABS: ORAL_TABLET | ORAL | 11 refills | Status: DC

## 2023-09-12 MED ORDER — PREDNISONE 10 MG PO TABS: ORAL_TABLET | ORAL | 0 refills | Status: DC

## 2023-09-12 MED ORDER — PANTOPRAZOLE SODIUM 40 MG PO TBEC: 40.0000 mg | DELAYED_RELEASE_TABLET | Freq: Every day | ORAL | 2 refills | Status: DC

## 2023-09-12 NOTE — Assessment & Plan Note (Addendum)
Quit smoking 2017 with dx of lung ca - PFT 02/22/23 >> FEV1 1.95 (96%), FEV1% 80, TLC 4.83 (108%), RV 2.54 (133%), DLCO 52%  worse p SABA and no curvature on inp/exp to indicate true airflow obstruction  - 09/12/2023  After extensive coaching inhaler device,  effectiveness =    80% with hfa so just rx as AB with albuterol more appropriately  09/12/2023   Walked on RA  x  3  lap(s) =  approx 450  ft  @ mod pace, stopped due to end of study with lowest 02 sats 96%  p saba prior   Re SABA :  I spent extra time with pt today reviewing appropriate use of albuterol for prn use on exertion with the following points: 1) saba is for relief of sob that does not improve by walking a slower pace or resting but rather if the pt does not improve after trying this first. 2) If the pt is convinced, as many are, that saba helps recover from activity faster then it's easy to tell if this is the case by re-challenging : ie stop, take the inhaler, then p 5 minutes try the exact same activity (intensity of workload) that just caused the symptoms and see if they are substantially diminished or not after saba 3) if there is an activity that reproducibly causes the symptoms, try the saba 15 min before the activity on alternate days   If in fact the saba really does help, then fine to continue to use it prn but advised may need to look closer at the maintenance regimen being used to achieve better control of airways disease with exertion.   Each maintenance medication was reviewed in detail including emphasizing most importantly the difference between maintenance and prns and under what circumstances the prns are to be triggered using an action plan format where appropriate.  Total time for H and P, chart review, counseling, reviewing hfa device(s) , directly observing portions of ambulatory 02 saturation study/ and generating customized AVS unique to this office visit / same day charting = 40 min with pt new to me

## 2023-09-12 NOTE — Assessment & Plan Note (Addendum)
Onset ? 2023 / worse on trelegy 100 > d/c'd 09/09/23 -  max gerd rx 09/12/2023 >>>   Upper airway cough syndrome (previously labeled PNDS),  is so named because it's frequently impossible to sort out how much is  CR/sinusitis with freq throat clearing (which can be related to primary GERD)   vs  causing  secondary (" extra esophageal")  GERD from wide swings in gastric pressure that occur with throat clearing, often  promoting self use of mint and menthol lozenges that reduce the lower esophageal sphincter tone and exacerbate the problem further in a cyclical fashion.   These are the same pts (now being labeled as having "irritable larynx syndrome" by some cough centers) who not infrequently have a history of having failed to tolerate ace inhibitors,  dry powder inhalers or biphosphonates or report having atypical/extraesophageal reflux symptoms that don't respond to standard doses of PPI  and are easily confused as having aecopd or asthma flares by even experienced allergists/ pulmonologists (myself included).   >>>   Rec leave off trelegy (see COPD) and max gerd rx/sugarless candy to resist the urge to cough and For cough/ congestion > mucinex or mucinex dm  up to maximum of  1200 mg every 12 hours and use the flutter valve as much as you can

## 2023-09-12 NOTE — Patient Instructions (Addendum)
Pantoprazole (protonix) 40 mg   Take  30-60 min before first meal of the day and Pepcid (famotidine)  20 mg after supper until return to office - this is the best way to tell whether stomach acid is contributing to your problem.    GERD (REFLUX)  is an extremely common cause of respiratory symptoms just like yours , many times with no obvious heartburn at all.    It can be treated with medication, but also with lifestyle changes including elevation of the head of your bed (ideally with 6 -8inch blocks under the headboard of your bed),  Smoking cessation, avoidance of late meals, excessive alcohol, and avoid fatty foods, chocolate, peppermint, colas, red wine, and acidic juices such as orange juice.  NO MINT OR MENTHOL PRODUCTS SO NO COUGH DROPS  USE SUGARLESS CANDY INSTEAD (Jolley ranchers or Stover's or Life Savers) or even ice chips will also do - the key is to swallow to prevent all throat clearing. NO OIL BASED VITAMINS - use powdered substitutes.  Avoid fish oil when coughing.   Only use your albuterol as a rescue medication to be used if you can't catch your breath by resting or doing a relaxed purse lip breathing pattern.  - The less you use it, the better it will work when you need it. - Ok to use up to 2 puffs  every 4 hours if you must but call for immediate appointment if use goes up over your usual need - Don't leave home without it !!  (think of it like starter fluid)  Also  Ok to try albuterol 15 min before an activity (on alternating days)  that you know would usually make you short of breath and see if it makes any difference and if makes none then don't take albuterol after activity unless you can't catch your breath as this means it's the resting that helps, not the albuterol.      For cough/ congestion >   Mucinex dm  up to maximum of  1200 mg every 12 hours and use the flutter valve as much as you can    Prednisone 10 mg take  4 each am x 2 days,   2 each am x 2 days,  1 each  am x 2 days and stop    Please schedule a follow up office visit in 6 weeks, call sooner if needed with all medications /inhalers/ solutions in hand so we can verify exactly what you are taking. This includes all medications from all doctors and over the counters - PLEASE separate them into two bags:  the ones you take automatically, no matter what, vs the ones you take just when you feel you need them "BAG #2 is UP TO YOU"  - this will really help [place] help you take your medications more effectively.

## 2023-09-13 ENCOUNTER — Other Ambulatory Visit: Payer: Self-pay

## 2023-09-13 ENCOUNTER — Telehealth: Payer: Self-pay | Admitting: Internal Medicine

## 2023-09-13 DIAGNOSIS — R682 Dry mouth, unspecified: Secondary | ICD-10-CM

## 2023-09-13 NOTE — Telephone Encounter (Signed)
Patient is calling because she is experiencing a tart feeling in her mouth that is causing her lips to be dry as well. It is hard to swallow and nothing is working to [employer]. It is causing it to be hard to talk as well. Please call and advise [phone]

## 2023-09-13 NOTE — Telephone Encounter (Signed)
Other than keeping mouth completely closed, breathing thru nose and keeping hard rock candy in her mouth, all of which I rec yesterday.  Will need to be referred to ENT / [facility] next available

## 2023-09-13 NOTE — Telephone Encounter (Signed)
Pls call cell phone [phone]

## 2023-09-13 NOTE — Telephone Encounter (Signed)
Called and spoke with patient  advised her of this information,  let pt that we will put in a referral  to Ent and someone from their office will call her with an appt.

## 2023-09-13 NOTE — Telephone Encounter (Signed)
Pt was seen yesterday for an ov, she said that she still having dry mouth ,throat and lips. She said that she has tried using dry mouth drops and drinking plenty of water and nothing has help. She said that she notice after she stop using the Trelegy which she said that was on last Friday. Pt said that she need som relief. Pt said that she wasn't having any other symptoms

## 2023-09-13 NOTE — Telephone Encounter (Signed)
Put in referral to ENT  and noted pt would like to be seen in [place] , pt doesn't have away to get to [place]

## 2023-09-15 ENCOUNTER — Telehealth: Payer: Self-pay | Admitting: Internal Medicine

## 2023-09-16 NOTE — Telephone Encounter (Signed)
Routing to Dr. [name] as an [place] Childes.

## 2023-09-27 DIAGNOSIS — Z6823 Body mass index (BMI) 23.0-23.9, adult: Secondary | ICD-10-CM | POA: Diagnosis not present

## 2023-09-27 DIAGNOSIS — Z299 Encounter for prophylactic measures, unspecified: Secondary | ICD-10-CM | POA: Diagnosis not present

## 2023-09-27 DIAGNOSIS — I10 Essential (primary) hypertension: Secondary | ICD-10-CM | POA: Diagnosis not present

## 2023-09-27 DIAGNOSIS — Z23 Encounter for immunization: Secondary | ICD-10-CM | POA: Diagnosis not present

## 2023-10-01 DIAGNOSIS — M629 Disorder of muscle, unspecified: Secondary | ICD-10-CM | POA: Diagnosis not present

## 2023-10-01 DIAGNOSIS — Z6824 Body mass index (BMI) 24.0-24.9, adult: Secondary | ICD-10-CM | POA: Diagnosis not present

## 2023-10-01 DIAGNOSIS — R030 Elevated blood-pressure reading, without diagnosis of hypertension: Secondary | ICD-10-CM | POA: Diagnosis not present

## 2023-10-01 DIAGNOSIS — N3000 Acute cystitis without hematuria: Secondary | ICD-10-CM | POA: Diagnosis not present

## 2023-10-06 DIAGNOSIS — J449 Chronic obstructive pulmonary disease, unspecified: Secondary | ICD-10-CM | POA: Diagnosis not present

## 2023-10-06 DIAGNOSIS — N390 Urinary tract infection, site not specified: Secondary | ICD-10-CM | POA: Diagnosis not present

## 2023-10-06 DIAGNOSIS — I10 Essential (primary) hypertension: Secondary | ICD-10-CM | POA: Diagnosis not present

## 2023-10-06 DIAGNOSIS — Z299 Encounter for prophylactic measures, unspecified: Secondary | ICD-10-CM | POA: Diagnosis not present

## 2023-10-06 DIAGNOSIS — I700 Atherosclerosis of aorta: Secondary | ICD-10-CM | POA: Diagnosis not present

## 2023-10-06 DIAGNOSIS — R339 Retention of urine, unspecified: Secondary | ICD-10-CM | POA: Diagnosis not present

## 2023-10-06 DIAGNOSIS — D692 Other nonthrombocytopenic purpura: Secondary | ICD-10-CM | POA: Diagnosis not present

## 2023-10-12 DIAGNOSIS — M542 Cervicalgia: Secondary | ICD-10-CM | POA: Diagnosis not present

## 2023-10-12 DIAGNOSIS — Z299 Encounter for prophylactic measures, unspecified: Secondary | ICD-10-CM | POA: Diagnosis not present

## 2023-10-12 DIAGNOSIS — I10 Essential (primary) hypertension: Secondary | ICD-10-CM | POA: Diagnosis not present

## 2023-10-12 DIAGNOSIS — G8929 Other chronic pain: Secondary | ICD-10-CM | POA: Diagnosis not present

## 2023-10-20 NOTE — Progress Notes (Unsigned)
Emily Pope, female    DOB: 10-Mar-1956    MRN: [id number]   Brief patient profile:  21  yowf  quit smoking 2017  self referred to pulmonary clinic in [facility]  09/12/2023 by for copd/ GOLD 0 Sood pt  PFT 02/22/23 >> FEV1 1.95 (96%), FEV1% 80, TLC 4.83 (108%), RV 2.54 (133%), DLCO 52% and  worse p SABA     History of Present Illness  09/12/2023  Pulmonary/ 1st office eval/ [name] / [place] Office off trelegy since 09/09/23  Chief Complaint  Patient presents with   Centrilobular emphysema   Dyspnea:  MMRC1 = can walk nl pace, flat grade, can't hurry or go uphills or steps s sob   Cough: sensation of chest congestion creamy x 2017 when finished RT Sleep: level bed /one pillow s resp wakes up dry and thirty  SABA use: once or twice daily  02: none  Rec Pantoprazole (protonix) 40 mg   Take  30-60 min before first meal of the day and Pepcid (famotidine)  20 mg after supper until return to office  Only use your albuterol as a rescue medication  Also  Ok to try albuterol 15 min before an activity (on alternating days)  that you know would usually make you short of breath   For cough/ congestion >   Mucinex dm  up to maximum of  1200 mg every 12 hours and use the flutter valve as much as you can   Prednisone 10 mg take  4 each am x 2 days,   2 each am x 2 days,  1 each am x 2 days and stop    Please schedule a follow up office visit in 6 weeks, call sooner if needed with all medications /inhalers/ solutions in hand     10/21/2023  f/u ov/[place] office/Danaiya Steadman re: GOLD 0 copd/ emphysema on CT / ? UACS  maint on prn saba -  did  bring meds  Chief Complaint  Patient presents with   COPD    Gold 0  Dyspnea:  improved off trelegy but gets chest tightness reproducibly with house work - has not tried saba prior to ex but helps if sits down and then takes it  Cough: yellowish green worse x sev weeks  Sleeping: flat bed one pillow no   resp cc  SABA use: as above  02: none  Prednisone  for neck did not help her chest tightness     No obvious day to day or daytime variability or assoc excess/ purulent sputum or mucus plugs or hemoptysis or  subjective wheeze or overt sinus or hb symptoms.    Also denies any obvious fluctuation of symptoms with weather or environmental changes or other aggravating or alleviating factors except as outlined above   No unusual exposure hx or h/o childhood pna/ asthma or knowledge of premature birth.  Current Allergies, Complete Past Medical History, Past Surgical History, Family History, and Social History were reviewed in [employer] record.  ROS  The following are not active complaints unless bolded Hoarseness, sore throat, dysphagia, dental problems, itching, sneezing,  nasal congestion or discharge of excess mucus or purulent secretions, ear ache,   fever, chills, sweats, unintended wt loss or wt gain, classically pleuritic   cp,  orthopnea pnd or arm/hand swelling  or leg swelling, presyncope, palpitations, abdominal pain, anorexia, nausea, vomiting, diarrhea  or change in bowel habits or change in bladder habits, change in stools or change in urine,  dysuria, hematuria,  rash, arthralgias, visual complaints, headache, numbness, weakness or ataxia or problems with walking or coordination,  change in mood or  memory.        Current Meds  Medication Sig   acetaminophen (TYLENOL 8 HOUR ARTHRITIS PAIN) 650 MG CR tablet Take 650 mg by mouth every 8 (eight) hours as needed for pain.   albuterol (PROVENTIL HFA;VENTOLIN HFA) 108 (90 Base) MCG/ACT inhaler Inhale 2 puffs into the lungs every 6 (six) hours as needed for wheezing or shortness of breath.   albuterol (PROVENTIL) (2.5 MG/3ML) 0.083% nebulizer solution Take 3 mLs (2.5 mg total) by nebulization every 6 (six) hours as needed for wheezing or shortness of breath.   ALPRAZolam (XANAX) 0.25 MG tablet Take 0.25 mg by mouth daily.   Artificial Saliva (AQUORAL) SOLN by  Transmucosal route.            buPROPion (WELLBUTRIN XL) 300 MG 24 hr tablet Take 300 mg by mouth daily.        Cyanocobalamin 5000 MCG TBDP Dissolve in the mouth.   dextromethorphan-guaiFENesin (MUCINEX DM) 30-600 MG 12hr tablet Take 1 tablet by mouth 2 (two) times daily.   diphenhydrAMINE (BENADRYL) 25 mg capsule Take 25 mg by mouth every 6 (six) hours as needed.   docusate sodium (COLACE) 100 MG capsule Take 100 mg by mouth in the morning.   famotidine (PEPCID) 20 MG tablet One after supper   hydrochlorothiazide (HYDRODIURIL) 12.5 MG tablet Take 12.5 mg by mouth daily as needed (if blood pressure is greater than 128/82).   levocetirizine (XYZAL) 5 MG tablet Take 5 mg by mouth every evening.   losartan (COZAAR) 50 MG tablet Take 50 mg by mouth daily as needed (if blood pressure is greater than 128/82).   Melatonin 10 MG TABS Take 10 mg by mouth at bedtime.   Multiple Vitamin (MULTIVITAMIN) tablet Take 1 tablet by mouth daily.   naproxen sodium (ALEVE) 220 MG tablet Take 220-440 mg by mouth 2 (two) times daily as needed (pain.).   nicotine polacrilex (NICORETTE) 4 MG lozenge Take 4 mg by mouth as needed for smoking cessation.   pantoprazole (PROTONIX) 40 MG tablet Take 1 tablet (40 mg total) by mouth daily. Take 30-60 min before first meal of the day   traMADol (ULTRAM) 50 MG tablet Take 50 mg by mouth every 6 (six) hours as needed.   VEOZAH 45 MG TABS Take 1 tablet by mouth daily.   Vitamin D, Ergocalciferol, (DRISDOL) 1.25 MG (50000 UNIT) CAPS capsule Take by mouth.           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  General appearance:    somewhat anxious amb bf nad    HEENT : Oropharynx  clear  Nasal turbinates nl    NECK :  without  apparent JVD/ palpable Nodes/TM    LUNGS: no acc muscle use,  Nl contour chest which is clear to A and P bilaterally without cough on insp or exp maneuvers   CV:  RRR  no s3 or murmur or increase in P2, and no edema   ABD:  soft and nontender with nl inspiratory excursion in the supine position. No bruits or organomegaly appreciated   MS:  Nl gait/ ext warm without deformities Or obvious joint restrictions  calf tenderness, cyanosis or clubbing    SKIN: warm and dry without lesions    NEURO:  alert, approp, nl sensorium with  no motor or cerebellar deficits apparent.    exam nl x for bp        Assessment

## 2023-10-21 ENCOUNTER — Ambulatory Visit (INDEPENDENT_AMBULATORY_CARE_PROVIDER_SITE_OTHER): Payer: 59 | Admitting: Internal Medicine

## 2023-10-21 ENCOUNTER — Encounter: Payer: Self-pay | Admitting: Internal Medicine

## 2023-10-21 VITALS — BP 170/73 | HR 100 | Ht 60.0 in | Wt 122.0 lb

## 2023-10-21 DIAGNOSIS — R058 Other specified cough: Secondary | ICD-10-CM

## 2023-10-21 DIAGNOSIS — J449 Chronic obstructive pulmonary disease, unspecified: Secondary | ICD-10-CM | POA: Diagnosis not present

## 2023-10-21 MED ORDER — CLONIDINE HCL 0.1 MG PO TABS: 0.1000 mg | ORAL_TABLET | Freq: Two times a day (BID) | ORAL | 2 refills | Status: DC

## 2023-10-21 MED ORDER — AZITHROMYCIN 250 MG PO TABS: ORAL_TABLET | ORAL | 0 refills | Status: DC

## 2023-10-21 NOTE — Patient Instructions (Addendum)
Also  Ok to try albuterol 15 min before an activity (on alternating days with inhaler, then the nebulizer)  that you know would usually make you short of breath and see if it makes any difference with your chest tightness  and if makes none then don't take albuterol after activity unless you can't catch your breath as this means it's the resting that helps, not the albuterol.   Zpak should clear mucus   Clonidine 0.1 mg twice daily until you see Dr Sherril Croon   Please schedule a follow up visit in 3 months but call sooner if needed

## 2023-10-22 ENCOUNTER — Encounter: Payer: Self-pay | Admitting: Internal Medicine

## 2023-10-22 NOTE — Assessment & Plan Note (Addendum)
Onset ? 2023 / worse on trelegy 100 > d/c'd 09/09/23 -  max gerd rx 09/12/2023 >>> improved initially off trelegy until "caught cold"   Rx zpak/ continue gerd rx and leave off dpi

## 2023-10-22 NOTE — Assessment & Plan Note (Addendum)
Quit smoking 2017 with dx of lung ca - PFT 02/22/23 >> FEV1 1.95 (96%), FEV1% 80, TLC 4.83 (108%), RV 2.54 (133%), DLCO 52%  worse p SABA and no curvature on inp/exp to indicate true airflow obstruction  - 09/12/2023  After extensive coaching inhaler device,  effectiveness =    80% with hfa so just rx as AB with albuterol more appropriately  09/12/2023   Walked on RA  x  3  lap(s) =  approx 450  ft  @ mod pace, stopped due to end of study with lowest 02 sats 96%  p saba prior   10/21/2023  After extensive coaching inhaler device,  effectiveness =    90% with hfa  Mild flare with discolored mucus but no convincing evidence of typical aecopd so rec just rx with Zpak and encourage more approp saba:  Re SABA :  I spent extra time with pt today reviewing appropriate use of albuterol for prn use on exertion with the following points: 1) saba is for relief of sob that does not improve by walking a slower pace or resting but rather if the pt does not improve after trying this first. 2) If the pt is convinced, as many are, that saba helps recover from activity faster then it's easy to tell if this is the case by re-challenging : ie stop, take the inhaler, then p 5 minutes try the exact same activity (intensity of workload) that just caused the symptoms and see if they are substantially diminished or not after saba 3) if there is an activity that reproducibly causes the symptoms, try the saba 15 min before the activity on alternate days   If in fact the saba really does help, then fine to continue to use it prn but advised may need to look closer at the maintenance regimen being used to achieve better control of airways disease with exertion.

## 2023-10-22 NOTE — Assessment & Plan Note (Signed)
Cannot tolerate BB and having hbp and tachycardia and anxiety and exertional chest tightness so rec short term clonidine for all 3 symptoms and f/u with PCP ASAP for rx or cards referral but likely needs CCB like cardizem for smoother long term control   Discussed in detail all the  indications, usual  risks and alternatives  relative to the benefits with patient who agrees to proceed with Rx as outlined.             Each maintenance medication was reviewed in detail including emphasizing most importantly the difference between maintenance and prns and under what circumstances the prns are to be triggered using an action plan format where appropriate.  Total time for H and P, chart review, counseling, reviewing hfa device(s) and generating customized AVS unique to this office visit / same day charting  > 30 min for multiple  refractory respiratory  symptoms of uncertain etiology

## 2023-10-25 ENCOUNTER — Other Ambulatory Visit: Payer: Self-pay | Admitting: Internal Medicine

## 2023-10-25 DIAGNOSIS — D692 Other nonthrombocytopenic purpura: Secondary | ICD-10-CM | POA: Diagnosis not present

## 2023-10-25 DIAGNOSIS — Z1231 Encounter for screening mammogram for malignant neoplasm of breast: Secondary | ICD-10-CM

## 2023-10-25 DIAGNOSIS — J441 Chronic obstructive pulmonary disease with (acute) exacerbation: Secondary | ICD-10-CM | POA: Diagnosis not present

## 2023-10-25 DIAGNOSIS — R0602 Shortness of breath: Secondary | ICD-10-CM | POA: Diagnosis not present

## 2023-10-25 DIAGNOSIS — I700 Atherosclerosis of aorta: Secondary | ICD-10-CM | POA: Diagnosis not present

## 2023-10-28 ENCOUNTER — Ambulatory Visit
Admission: RE | Admit: 2023-10-28 | Discharge: 2023-10-28 | Disposition: A | Discharge status: Home / Self Care | Payer: 59 | Source: Ambulatory Visit | Attending: Internal Medicine | Admitting: Internal Medicine

## 2023-10-28 DIAGNOSIS — Z1231 Encounter for screening mammogram for malignant neoplasm of breast: Secondary | ICD-10-CM | POA: Diagnosis not present

## 2023-11-01 ENCOUNTER — Ambulatory Visit: Payer: 59 | Attending: Internal Medicine | Admitting: Internal Medicine

## 2023-11-01 ENCOUNTER — Other Ambulatory Visit (HOSPITAL_COMMUNITY)
Admission: RE | Admit: 2023-11-01 | Discharge: 2023-11-01 | Disposition: A | Discharge status: Home / Self Care | Payer: 59 | Source: Ambulatory Visit | Attending: Internal Medicine | Admitting: Internal Medicine

## 2023-11-01 ENCOUNTER — Encounter: Payer: Self-pay | Admitting: Internal Medicine

## 2023-11-01 VITALS — BP 110/74 | HR 90 | Ht 60.0 in | Wt 120.0 lb

## 2023-11-01 DIAGNOSIS — R079 Chest pain, unspecified: Secondary | ICD-10-CM | POA: Diagnosis not present

## 2023-11-01 DIAGNOSIS — R0609 Other forms of dyspnea: Secondary | ICD-10-CM | POA: Insufficient documentation

## 2023-11-01 LAB — BASIC METABOLIC PANEL
Anion gap: 11 (ref 5–15)
BUN: 8 mg/dL (ref 8–23)
CO2: 27 mmol/L (ref 22–32)
Calcium: 8.9 mg/dL (ref 8.9–10.3)
Chloride: 98 mmol/L (ref 98–111)
Creatinine, Ser: 0.94 mg/dL (ref 0.44–1.00)
GFR, Estimated: >60 mL/min (ref >60)
Glucose, Bld: 93 mg/dL (ref 70–99)
Potassium: 3.4 mmol/L — ABNORMAL LOW (ref 3.5–5.1)
Sodium: 136 mmol/L (ref 135–145)

## 2023-11-01 MED ORDER — METOPROLOL TARTRATE 25 MG PO TABS: 12.5000 mg | ORAL_TABLET | Freq: Two times a day (BID) | ORAL | 3 refills | Status: DC

## 2023-11-01 MED ORDER — METOPROLOL TARTRATE 100 MG PO TABS
100.0000 mg | ORAL_TABLET | Freq: Once | ORAL | 0 refills | Status: DC
Start: 2023-11-01 — End: 2023-11-30

## 2023-11-01 NOTE — Progress Notes (Signed)
Cardiology Office Note  Date: 11/01/2023   ID: Emily Pope, DOB [birth year], MRN [id number]  PCP:  Ignatius Specking, MD  Cardiologist:  Marjo Bicker, MD Electrophysiologist:  None   History of Present Illness: Emily Pope is a 67 y.o. female known to have COPD, HTN, was referred to cardiology clinic for evaluation of DOE and chest pain.  Ongoing DOE and exertional chest tightness for the last 3 weeks, occurring multiple times throughout the day, lasting for at least a few minutes and resolving with rest.  She is not able to make her bed and not even able to walk without getting out of breath and chest getting tighter.  Does not smoke cigarettes.  No dizziness, syncope or leg swelling.  She does get little dizzy and her feet wobbly during these episodes.  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    Past Surgical History:  Procedure Laterality Date   ABDOMINAL HYSTERECTOMY     APPENDECTOMY     when had hysterectomy   CARPAL TUNNEL RELEASE Bilateral    ENDOBRONCHIAL ULTRASOUND Bilateral 08/23/2016   Procedure: ENDOBRONCHIAL ULTRASOUND;  Surgeon: Leslye Peer, MD;  Location: [facility];  Service: Cardiopulmonary;  Laterality: Bilateral;   HERNIA REPAIR     left inguinal hernia age 2   PORT-A-CATH REMOVAL Left 11/02/2021   Procedure: MINOR REMOVAL PORT-A-CATH;  Surgeon: Franky Macho, MD;  Location: [facility];  Service: General;  Laterality: Left;  pt to arrive at 8:30   PORTACATH PLACEMENT Left 09/17/2016   Procedure: INSERTION PORT-A-CATH LEFT SUBCLAVIAN;  Surgeon: Franky Macho, MD;  Location: [facility];  Service: General;  Laterality: Left;   TUBAL LIGATION       Current Outpatient Medications  Medication Sig Dispense Refill   acetaminophen (TYLENOL 8 HOUR ARTHRITIS PAIN) 650 MG CR tablet Take 650 mg by mouth every 8 (eight) hours as needed for pain.     albuterol (PROVENTIL HFA;VENTOLIN HFA) 108 (90 Base) MCG/ACT inhaler Inhale 2 puffs into the lungs every 6 (six) hours as needed for wheezing or shortness of breath. 1 Inhaler 6   albuterol (PROVENTIL) (2.5 MG/3ML) 0.083% nebulizer solution Take 3 mLs (2.5 mg total) by nebulization every 6 (six) hours as needed for wheezing or shortness of breath. 360 mL 5   ALPRAZolam (XANAX) 0.25 MG tablet Take 0.25 mg by mouth daily.     Artificial Saliva (AQUORAL) SOLN by Transmucosal route.     aspirin EC 81 MG tablet Take 1 tablet by mouth daily.     azithromycin (ZITHROMAX) 250 MG tablet Take 2 on day one then 1 daily x 4 days 6 tablet 0   buPROPion (WELLBUTRIN XL) 300 MG 24 hr tablet Take 300 mg by mouth daily.     cloNIDine (CATAPRES) 0.1 MG tablet Take 1 tablet (0.1 mg total) by mouth 2 (two) times daily. 60 tablet 2   Cyanocobalamin 5000 MCG TBDP Dissolve in the mouth.     dextromethorphan-guaiFENesin (MUCINEX DM) 30-600 MG 12hr tablet Take 1 tablet by mouth 2 (two) times daily.     diphenhydrAMINE (BENADRYL) 25 mg capsule Take 25  mg by mouth every 6 (six) hours as needed.     docusate sodium (COLACE) 100 MG capsule Take 100 mg by mouth in the morning.     famotidine (PEPCID) 20 MG tablet One after supper 30 tablet 11   hydrochlorothiazide (HYDRODIURIL) 12.5 MG tablet Take 12.5 mg by mouth daily as needed (if blood pressure is greater than 128/82).  0   levocetirizine (XYZAL) 5 MG tablet Take 5 mg by mouth every evening.     losartan (COZAAR) 50 MG tablet Take 50 mg by mouth daily as needed (if blood pressure is greater than 128/82).  0   Melatonin 10 MG TABS Take 10 mg by mouth at bedtime.     metoprolol tartrate (LOPRESSOR) 100 MG tablet Take 1 tablet (100 mg total) by mouth once for 1 dose. Take 2  hours prior to CT scan 1 tablet 0   metoprolol tartrate (LOPRESSOR) 25 MG tablet Take 0.5 tablets (12.5 mg total) by mouth 2 (two) times daily. 90 tablet 3   Multiple Vitamin (MULTIVITAMIN) tablet Take 1 tablet by mouth daily.     naproxen sodium (ALEVE) 220 MG tablet Take 220-440 mg by mouth 2 (two) times daily as needed (pain.).     nicotine polacrilex (NICORETTE) 4 MG lozenge Take 4 mg by mouth as needed for smoking cessation.     pantoprazole (PROTONIX) 40 MG tablet Take 1 tablet (40 mg total) by mouth daily. Take 30-60 min before first meal of the day 30 tablet 2   traMADol (ULTRAM) 50 MG tablet Take 50 mg by mouth every 6 (six) hours as needed.     VEOZAH 45 MG TABS Take 1 tablet by mouth daily.     Vitamin D, Ergocalciferol, (DRISDOL) 1.25 MG (50000 UNIT) CAPS capsule Take by mouth.     No current facility-administered medications for this visit.   Allergies:  Clindamycin/lincomycin, Codeine, Lincomycin, Other, Penicillins, Vancomycin, Sulfa antibiotics, Hydrocodone, and Tizanidine   Social History: The patient  reports that she quit smoking about 7 years ago. Her smoking use included cigarettes. She started smoking about 50 years ago. She has a 86 pack-year smoking history. She has never used smokeless tobacco. She reports that she does not drink alcohol and does not use drugs.   Family History: The patient's family history includes Diabetes in her father; Hypertension in her father, mother, sister, and sister; Lung cancer in her brother and mother.   ROS:  Please see the history of present illness. Otherwise, complete review of systems is positive for none.  All other systems are reviewed and negative.   Physical Exam: VS:  BP 110/74   Pulse 90   Ht 5' (1.524 m)   Wt 120 lb (54.4 kg)   LMP 08/04/1985 (Approximate) Comment: hysterectomy @ 67 years old  SpO2 96%   BMI 23.44 kg/m , BMI Body mass index is 23.44 kg/m.  Wt Readings from Last 3 Encounters:  11/01/23 120 lb (54.4 kg)   10/21/23 122 lb (55.3 kg)  09/12/23 122 lb (55.3 kg)    General: Patient appears comfortable at rest. HEENT: Conjunctiva and lids normal, oropharynx clear with moist mucosa. Neck: Supple, no elevated JVP or carotid bruits, no thyromegaly. Lungs: Clear to auscultation, nonlabored breathing at rest. Cardiac: Regular rate and rhythm, no S3 or significant systolic murmur, no pericardial rub. Abdomen: Soft, nontender, no hepatomegaly, bowel sounds present, no guarding or rebound. Extremities: No pitting edema, distal pulses 2+. Skin: Warm and dry. Musculoskeletal: No kyphosis. Neuropsychiatric:  Alert and oriented x3, affect grossly appropriate.  Recent Labwork: 06/29/2023: Hemoglobin 13.5; Platelets 339 08/04/2023: ALT 59; AST 54 11/01/2023: BUN 8; Creatinine, Ser 0.94; Potassium 3.4; Sodium 136  No results found for: "CHOL", "TRIG", "HDL", "CHOLHDL", "VLDL", "LDLCALC", "LDLDIRECT"   Assessment and Plan:  DOE Cardiac chest pain COPD Lung cancer in remission HTN, controlled   -Ongoing daily DOE and exertional chest tightness lasting for at least a few minutes (occurs with activities like making her bed, walking and resolves with rest) and occurs multiple times throughout the day.  Worsening and becoming more frequent in the last 3 weeks.  Echocardiogram and NM stress test in 2019 were within normal limits.  Will repeat echocardiogram and obtain CTA cardiac.  Will start metoprolol tartrate 12.5 mg twice daily for cardiac chest pains. -Currently on losartan 50 mg once daily and HCTZ 12.5 mg once daily (previously as needed but now scheduled medication). She is also on clonidine 0.1 mg twice daily.  I spent a total duration of 45 minutes reviewing the prior medications, notes, labs, EKG, PFTs, face-to-face discussion/counseling of her medical condition, evaluation, management, ordering test and documenting the findings in the note.  Medication Adjustments/Labs and Tests Ordered: Current  medicines are reviewed at length with the patient today.  Concerns regarding medicines are outlined above.    Disposition:  Follow up 3 months  Signed, Caliyah Sieh Verne Spurr, MD, 11/01/2023 1:10 PM    [facility] at [facility] [address], [place], [place] [address]

## 2023-11-01 NOTE — Patient Instructions (Signed)
Medication Instructions:  Your physician has recommended you make the following change in your medication:   Take Lopressor 100 mg two hours prior to CT Scan   Start Taking Lopressor 12.5 mg Two Times Daily ( Do not Take on the Day of your CT Scan)   *If you need a refill on your cardiac medications before your next appointment, please call your pharmacy*   Lab Work: Your physician recommends that you return for lab work in: Today   If you have labs (blood work) drawn today and your tests are completely normal, you will receive your results only by: MyChart Message (if you have MyChart) OR A paper copy in the mail If you have any lab test that is abnormal or we need to change your treatment, we will call you to review the results.   Testing/Procedures: Your physician has requested that you have an echocardiogram. Echocardiography is a painless test that uses sound waves to create images of your heart. It provides your doctor with information about the size and shape of your heart and how well your heart's chambers and valves are working. This procedure takes approximately one hour. There are no restrictions for this procedure. Please do NOT wear cologne, perfume, aftershave, or lotions (deodorant is allowed). Please arrive 15 minutes prior to your appointment time.  Please note: We ask at that you not bring children with you during ultrasound (echo/ vascular) testing. Due to room size and safety concerns, children are not allowed in the ultrasound rooms during exams. Our front office staff cannot provide observation of children in our lobby area while testing is being conducted. An adult accompanying a patient to their appointment will only be allowed in the ultrasound room at the discretion of the ultrasound technician under special circumstances. We apologize for any inconvenience.    Your cardiac CT will be scheduled at one of the below locations:   [facility] [address] [place], [place] [address] [phone]  OR  [facility] [address] [place], [place] [address] [phone]  OR   [facility] [address] [place], [place] [address] [phone]  If scheduled at [facility], please arrive at the [facility] and Children's Entrance (Entrance C2) of [facility] 30 minutes prior to test start time. You can use the FREE valet parking offered at entrance C (encouraged to control the heart rate for the test)  Proceed to the [facility] Radiology Department (first floor) to check-in and test prep.  All radiology patients and guests should use entrance C2 at [facility], accessed from [facility], even though the hospital's physical address listed is [address].    If scheduled at [facility] or [facility], please arrive 15 mins early for check-in and test prep.  There is spacious parking and easy access to the radiology department from the [facility] entrance. Please enter here and check-in with the desk attendant.   Please follow these instructions carefully (unless otherwise directed):  An IV will be required for this test and Nitroglycerin will be given.  Hold all erectile dysfunction medications at least 3 days (72 hrs) prior to test. (Ie viagra, cialis, sildenafil, tadalafil, etc)   On the Night Before the Test: Be sure to Drink plenty of water. Do not consume any caffeinated/decaffeinated beverages or chocolate 12 hours prior to your test. Do not take  any antihistamines 12 hours prior to your test. If the patient has contrast allergy: Patient will need a prescription for Prednisone and very clear instructions (as follows): Prednisone 50 mg - take 13 hours prior to test Take another Prednisone 50 mg 7 hours prior to test Take another Prednisone 50 mg 1  hour prior to test Take Benadryl 50 mg 1 hour prior to test Patient must complete all four doses of above prophylactic medications. Patient will need a ride after test due to Benadryl.  On the Day of the Test: Drink plenty of water until 1 hour prior to the test. Do not eat any food 1 hour prior to test. You may take your regular medications prior to the test.  Take metoprolol (Lopressor) two hours prior to test. If you take Furosemide/Hydrochlorothiazide/Spironolactone, please HOLD on the morning of the test. FEMALES- please wear underwire-free bra if available, avoid dresses & tight clothing  *For Clinical Staff only. Please instruct patient the following:* Heart Rate Medication Recommendations for Cardiac CT  Resting HR < 50 bpm  No medication  Resting HR 50-60 bpm and BP >110/50 mmHG   Consider Metoprolol tartrate 25 mg PO 90-120 min prior to scan  Resting HR 60-65 bpm and BP >110/50 mmHG  Metoprolol tartrate 50 mg PO 90-120 minutes prior to scan   Resting HR > 65 bpm and BP >110/50 mmHG  Metoprolol tartrate 100 mg PO 90-120 minutes prior to scan  Consider Ivabradine 10-15 mg PO or a calcium channel blocker for resting HR >60 bpm and contraindication to metoprolol tartrate  Consider Ivabradine 10-15 mg PO in combination with metoprolol tartrate for HR >80 bpm        After the Test: Drink plenty of water. After receiving IV contrast, you may experience a mild flushed feeling. This is normal. On occasion, you may experience a mild rash up to 24 hours after the test. This is not dangerous. If this occurs, you can take Benadryl 25 mg and increase your fluid intake. If you experience trouble breathing, this can be serious. If it is severe call 911 IMMEDIATELY. If it is mild, please call our office. If you take any of these medications: Glipizide/Metformin, Avandament, Glucavance, please do not take 48 hours after completing test unless otherwise instructed.  We will call to schedule  your test 2-4 weeks out understanding that some insurance companies will need an authorization prior to the service being performed.   For more information and frequently asked questions, please visit our website : [email or web address]  For non-scheduling related questions, please contact the cardiac imaging nurse navigator should you have any questions/concerns: Cardiac Imaging Nurse Navigators Direct Office Dial: [phone]   For scheduling needs, including cancellations and rescheduling, please call [place], [phone].    Follow-Up: At [facility], you and your health needs are our priority.  As part of our continuing mission to provide you with exceptional heart care, we have created designated Provider Care Teams.  These Care Teams include your primary Cardiologist (physician) and Advanced Practice Providers (APPs -  Physician Assistants and Nurse Practitioners) who all work together to provide you with the care you need, when you need it.  We recommend signing up for the patient portal called "MyChart".  Sign up information is provided on this After Visit Summary.  MyChart is used to connect with patients for Virtual Visits (Telemedicine).  Patients are able to view lab/test results, encounter notes, upcoming appointments, etc.  Non-urgent messages can be sent to your  provider as well.   To learn more about what you can do with MyChart, go to [email or web address].    Your next appointment:   3  month(s)  Provider:   You may see Vishnu P Mallipeddi, MD or one of the following Advanced Practice Providers on your designated Care Team:   Randall An, PA-C  Jacolyn Reedy, PA-C     Other Instructions Thank you for choosing [facility]!

## 2023-11-02 ENCOUNTER — Telehealth: Payer: Self-pay | Admitting: Internal Medicine

## 2023-11-02 DIAGNOSIS — I700 Atherosclerosis of aorta: Secondary | ICD-10-CM | POA: Diagnosis not present

## 2023-11-02 DIAGNOSIS — Z299 Encounter for prophylactic measures, unspecified: Secondary | ICD-10-CM | POA: Diagnosis not present

## 2023-11-02 DIAGNOSIS — I10 Essential (primary) hypertension: Secondary | ICD-10-CM | POA: Diagnosis not present

## 2023-11-02 DIAGNOSIS — D692 Other nonthrombocytopenic purpura: Secondary | ICD-10-CM | POA: Diagnosis not present

## 2023-11-02 DIAGNOSIS — Z6823 Body mass index (BMI) 23.0-23.9, adult: Secondary | ICD-10-CM | POA: Diagnosis not present

## 2023-11-02 NOTE — Telephone Encounter (Signed)
Patient states information from her office visit yesterday, 11/05 with Dr. Jenene Slicker, was supposed to be sent to PCP, Dr. Sherril Croon, of [facility], but it has not been received. Patient would like an update.

## 2023-11-02 NOTE — Telephone Encounter (Signed)
Recipient Method Sent by Date Sent  Ignatius Specking, MD Fax Marjo Bicker, MD 11/01/2023  Fax: [phone] Phone: [phone]       Pt notified that ov note was sent to Dr. Sherril Croon by Dr. Jenene Slicker when note was signed at the close of visit.   Pt had no further questions or concerns at this time.

## 2023-11-06 DIAGNOSIS — S0990XA Unspecified injury of head, initial encounter: Secondary | ICD-10-CM | POA: Diagnosis not present

## 2023-11-06 DIAGNOSIS — Z880 Allergy status to penicillin: Secondary | ICD-10-CM | POA: Diagnosis not present

## 2023-11-06 DIAGNOSIS — Z789 Other specified health status: Secondary | ICD-10-CM | POA: Insufficient documentation

## 2023-11-06 DIAGNOSIS — R1084 Generalized abdominal pain: Secondary | ICD-10-CM | POA: Diagnosis not present

## 2023-11-06 DIAGNOSIS — J984 Other disorders of lung: Secondary | ICD-10-CM | POA: Diagnosis not present

## 2023-11-06 DIAGNOSIS — I10 Essential (primary) hypertension: Secondary | ICD-10-CM | POA: Diagnosis not present

## 2023-11-06 DIAGNOSIS — J440 Chronic obstructive pulmonary disease with acute lower respiratory infection: Secondary | ICD-10-CM | POA: Diagnosis not present

## 2023-11-06 DIAGNOSIS — I499 Cardiac arrhythmia, unspecified: Secondary | ICD-10-CM | POA: Diagnosis not present

## 2023-11-06 DIAGNOSIS — N179 Acute kidney failure, unspecified: Secondary | ICD-10-CM | POA: Diagnosis not present

## 2023-11-06 DIAGNOSIS — Z87891 Personal history of nicotine dependence: Secondary | ICD-10-CM | POA: Diagnosis not present

## 2023-11-06 DIAGNOSIS — M542 Cervicalgia: Secondary | ICD-10-CM | POA: Diagnosis not present

## 2023-11-06 DIAGNOSIS — K859 Acute pancreatitis without necrosis or infection, unspecified: Secondary | ICD-10-CM | POA: Insufficient documentation

## 2023-11-06 DIAGNOSIS — Z743 Need for continuous supervision: Secondary | ICD-10-CM | POA: Diagnosis not present

## 2023-11-06 DIAGNOSIS — R058 Other specified cough: Secondary | ICD-10-CM | POA: Diagnosis not present

## 2023-11-06 DIAGNOSIS — M549 Dorsalgia, unspecified: Secondary | ICD-10-CM | POA: Diagnosis not present

## 2023-11-06 DIAGNOSIS — Z7982 Long term (current) use of aspirin: Secondary | ICD-10-CM | POA: Diagnosis not present

## 2023-11-06 DIAGNOSIS — K8689 Other specified diseases of pancreas: Secondary | ICD-10-CM | POA: Diagnosis not present

## 2023-11-06 DIAGNOSIS — R1030 Lower abdominal pain, unspecified: Secondary | ICD-10-CM | POA: Diagnosis not present

## 2023-11-06 DIAGNOSIS — Z79899 Other long term (current) drug therapy: Secondary | ICD-10-CM | POA: Diagnosis not present

## 2023-11-06 DIAGNOSIS — J918 Pleural effusion in other conditions classified elsewhere: Secondary | ICD-10-CM | POA: Diagnosis not present

## 2023-11-06 DIAGNOSIS — Z7951 Long term (current) use of inhaled steroids: Secondary | ICD-10-CM | POA: Diagnosis not present

## 2023-11-06 DIAGNOSIS — Z882 Allergy status to sulfonamides status: Secondary | ICD-10-CM | POA: Diagnosis not present

## 2023-11-06 DIAGNOSIS — R059 Cough, unspecified: Secondary | ICD-10-CM | POA: Diagnosis not present

## 2023-11-06 DIAGNOSIS — R2681 Unsteadiness on feet: Secondary | ICD-10-CM | POA: Diagnosis not present

## 2023-11-06 DIAGNOSIS — Z85118 Personal history of other malignant neoplasm of bronchus and lung: Secondary | ICD-10-CM | POA: Diagnosis not present

## 2023-11-06 DIAGNOSIS — J9811 Atelectasis: Secondary | ICD-10-CM | POA: Diagnosis not present

## 2023-11-06 DIAGNOSIS — J90 Pleural effusion, not elsewhere classified: Secondary | ICD-10-CM | POA: Diagnosis not present

## 2023-11-06 DIAGNOSIS — I272 Pulmonary hypertension, unspecified: Secondary | ICD-10-CM | POA: Diagnosis not present

## 2023-11-06 DIAGNOSIS — K219 Gastro-esophageal reflux disease without esophagitis: Secondary | ICD-10-CM | POA: Diagnosis not present

## 2023-11-06 DIAGNOSIS — K5730 Diverticulosis of large intestine without perforation or abscess without bleeding: Secondary | ICD-10-CM | POA: Diagnosis not present

## 2023-11-06 DIAGNOSIS — R918 Other nonspecific abnormal finding of lung field: Secondary | ICD-10-CM | POA: Diagnosis not present

## 2023-11-06 DIAGNOSIS — R109 Unspecified abdominal pain: Secondary | ICD-10-CM | POA: Diagnosis not present

## 2023-11-06 DIAGNOSIS — I700 Atherosclerosis of aorta: Secondary | ICD-10-CM | POA: Diagnosis not present

## 2023-11-06 DIAGNOSIS — Z885 Allergy status to narcotic agent status: Secondary | ICD-10-CM | POA: Diagnosis not present

## 2023-11-06 DIAGNOSIS — R6889 Other general symptoms and signs: Secondary | ICD-10-CM | POA: Diagnosis not present

## 2023-11-06 DIAGNOSIS — M47812 Spondylosis without myelopathy or radiculopathy, cervical region: Secondary | ICD-10-CM | POA: Diagnosis not present

## 2023-11-06 DIAGNOSIS — D72829 Elevated white blood cell count, unspecified: Secondary | ICD-10-CM | POA: Diagnosis not present

## 2023-11-06 DIAGNOSIS — Z792 Long term (current) use of antibiotics: Secondary | ICD-10-CM | POA: Diagnosis not present

## 2023-11-06 DIAGNOSIS — M5031 Other cervical disc degeneration,  high cervical region: Secondary | ICD-10-CM | POA: Diagnosis not present

## 2023-11-06 DIAGNOSIS — E876 Hypokalemia: Secondary | ICD-10-CM | POA: Diagnosis not present

## 2023-11-06 DIAGNOSIS — R0989 Other specified symptoms and signs involving the circulatory and respiratory systems: Secondary | ICD-10-CM | POA: Diagnosis not present

## 2023-11-06 DIAGNOSIS — M4802 Spinal stenosis, cervical region: Secondary | ICD-10-CM | POA: Diagnosis not present

## 2023-11-06 DIAGNOSIS — C801 Malignant (primary) neoplasm, unspecified: Secondary | ICD-10-CM | POA: Diagnosis not present

## 2023-11-06 DIAGNOSIS — Z1152 Encounter for screening for COVID-19: Secondary | ICD-10-CM | POA: Diagnosis not present

## 2023-11-06 DIAGNOSIS — M25519 Pain in unspecified shoulder: Secondary | ICD-10-CM | POA: Diagnosis not present

## 2023-11-06 DIAGNOSIS — Z20822 Contact with and (suspected) exposure to covid-19: Secondary | ICD-10-CM | POA: Diagnosis not present

## 2023-11-06 DIAGNOSIS — J439 Emphysema, unspecified: Secondary | ICD-10-CM | POA: Diagnosis not present

## 2023-11-06 DIAGNOSIS — R079 Chest pain, unspecified: Secondary | ICD-10-CM | POA: Diagnosis not present

## 2023-11-06 DIAGNOSIS — M6281 Muscle weakness (generalized): Secondary | ICD-10-CM | POA: Diagnosis not present

## 2023-11-06 DIAGNOSIS — Z881 Allergy status to other antibiotic agents status: Secondary | ICD-10-CM | POA: Diagnosis not present

## 2023-11-06 DIAGNOSIS — K2980 Duodenitis without bleeding: Secondary | ICD-10-CM | POA: Diagnosis not present

## 2023-11-06 DIAGNOSIS — C3490 Malignant neoplasm of unspecified part of unspecified bronchus or lung: Secondary | ICD-10-CM | POA: Diagnosis not present

## 2023-11-09 DIAGNOSIS — J189 Pneumonia, unspecified organism: Secondary | ICD-10-CM | POA: Insufficient documentation

## 2023-11-09 DIAGNOSIS — J180 Bronchopneumonia, unspecified organism: Secondary | ICD-10-CM | POA: Insufficient documentation

## 2023-11-11 ENCOUNTER — Other Ambulatory Visit (HOSPITAL_COMMUNITY): Payer: 59

## 2023-11-21 ENCOUNTER — Telehealth: Payer: Self-pay | Admitting: Internal Medicine

## 2023-11-21 DIAGNOSIS — J9610 Chronic respiratory failure, unspecified whether with hypoxia or hypercapnia: Secondary | ICD-10-CM | POA: Diagnosis not present

## 2023-11-21 DIAGNOSIS — Z299 Encounter for prophylactic measures, unspecified: Secondary | ICD-10-CM | POA: Diagnosis not present

## 2023-11-21 DIAGNOSIS — Z09 Encounter for follow-up examination after completed treatment for conditions other than malignant neoplasm: Secondary | ICD-10-CM | POA: Diagnosis not present

## 2023-11-21 DIAGNOSIS — I10 Essential (primary) hypertension: Secondary | ICD-10-CM | POA: Diagnosis not present

## 2023-11-21 DIAGNOSIS — I272 Pulmonary hypertension, unspecified: Secondary | ICD-10-CM | POA: Diagnosis not present

## 2023-11-21 NOTE — Telephone Encounter (Signed)
Pt c/o medication issue:  1. Name of Medication:   metoprolol tartrate (LOPRESSOR) 100 MG tablet   metoprolol tartrate (LOPRESSOR) 25 MG tablet   2. How are you currently taking this medication (dosage and times per day)?   3. Are you having a reaction (difficulty breathing--STAT)?   4. What is your medication issue? Pt would like a c/b regarding whether she is to take medication before schedule CT. Please advise

## 2023-11-21 NOTE — Telephone Encounter (Signed)
I called patient.She found her AVS from the office visit and was able to read to me the lopressor instructions-to take 100 mg 2 hours before CT and to hold her 12.5 mg am dose.She confirmed she has picked up the med from pharmacy. We also talked about no food or drink 1 hr prior to test.

## 2023-11-28 ENCOUNTER — Other Ambulatory Visit: Payer: Self-pay | Admitting: Internal Medicine

## 2023-11-28 DIAGNOSIS — K859 Acute pancreatitis without necrosis or infection, unspecified: Secondary | ICD-10-CM | POA: Diagnosis not present

## 2023-11-30 ENCOUNTER — Ambulatory Visit: Payer: 59 | Admitting: Internal Medicine

## 2023-11-30 ENCOUNTER — Encounter: Payer: Self-pay | Admitting: Internal Medicine

## 2023-11-30 VITALS — BP 138/87 | HR 98 | Ht 60.0 in | Wt 116.0 lb

## 2023-11-30 DIAGNOSIS — R058 Other specified cough: Secondary | ICD-10-CM | POA: Diagnosis not present

## 2023-11-30 DIAGNOSIS — J449 Chronic obstructive pulmonary disease, unspecified: Secondary | ICD-10-CM | POA: Diagnosis not present

## 2023-11-30 NOTE — Progress Notes (Signed)
Emily Pope, female    DOB: August 09, 1956    MRN: [id number]   Brief patient profile:  89  yowf  quit smoking 2017  self referred to pulmonary clinic in [facility]  09/12/2023 by for copd/ GOLD 0 Sood pt  PFT 02/22/23 >> FEV1 1.95 (96%), FEV1% 80, TLC 4.83 (108%), RV 2.54 (133%), DLCO 52% and  worse p SABA     History of Present Illness  09/12/2023  Pulmonary/ 1st office eval/ [name] / [place] Office off trelegy since 09/09/23  Chief Complaint  Patient presents with   Centrilobular emphysema   Dyspnea:  MMRC1 = can walk nl pace, flat grade, can't hurry or go uphills or steps s sob   Cough: sensation of chest congestion creamy x 2017 when finished RT Sleep: level bed /one pillow s resp wakes up dry and thirty  SABA use: once or twice daily  02: none  Rec Pantoprazole (protonix) 40 mg   Take  30-60 min before first meal of the day and Pepcid (famotidine)  20 mg after supper until return to office  Only use your albuterol as a rescue medication  Also  Ok to try albuterol 15 min before an activity (on alternating days)  that you know would usually make you short of breath   For cough/ congestion >   Mucinex dm  up to maximum of  1200 mg every 12 hours and use the flutter valve as much as you can   Prednisone 10 mg take  4 each am x 2 days,   2 each am x 2 days,  1 each am x 2 days and stop    Please schedule a follow up office visit in 6 weeks, call sooner if needed with all medications /inhalers/ solutions in hand     10/21/2023  f/u ov/[place] office/Lia Vigilante re: GOLD 0 copd/ emphysema on CT / ? UACS  maint on prn saba -  did  bring meds  Chief Complaint  Patient presents with   COPD    Gold 0  Dyspnea:  improved off trelegy but gets chest tightness reproducibly with house work - has not tried saba prior to ex but helps if sits down and then takes it  Cough: yellowish green worse x sev weeks  Sleeping: flat bed one pillow no   resp cc  SABA use: as above  02: none  Prednisone  for neck did not help her chest tightness  Rec Also  Ok to try albuterol 15 min before an activity (on alternating days with inhaler, then the nebulizer)  that you know would usually make you short of breath  Zpak should clear mucus  Clonidine 0.1 mg twice daily until you see Dr Sherril Croon  (can't tol Beta blockers)   Please schedule a follow up visit in 3 months but call sooner if needed   Admit [facility] 11/19/23  Pancreatitis   11/30/2023  f/u ov/[place] office/Chrissy Ealey re: GOLD 0/ emphysema  maint on no rx   Chief Complaint  Patient presents with   COPD   Shortness of Breath  Dyspnea:  able to do [employer] on day of ov slow pace  Cough: mostly yellowish just finished levaquin  Sleeping: on side one neck pillow s   resp cc  SABA use: avg  2-3 x per week hfa/ neb not at all  02: none   No obvious day to day or daytime variability or assoc   hemoptysis or cp or chest tightness, subjective wheeze or  overt  symptoms.    Also denies any obvious fluctuation of symptoms with weather or environmental changes or other aggravating or alleviating factors except as outlined above   No unusual exposure hx or h/o childhood pna/ asthma or knowledge of premature birth.  Current Allergies, Complete Past Medical History, Past Surgical History, Family History, and Social History were reviewed in [employer] record.  ROS  The following are not active complaints unless bolded Hoarseness, sore throat, dysphagia, dental problems, itching, sneezing,  nasal congestion or discharge of excess mucus or purulent secretions, ear ache,   fever, chills, sweats, unintended wt loss or wt gain, classically pleuritic or exertional cp,  orthopnea pnd or arm/hand swelling  or leg swelling, presyncope, palpitations, abdominal pain, anorexia, nausea, vomiting, diarrhea  or change in bowel habits or change in bladder habits, change in stools or change in urine, dysuria, hematuria,  rash, arthralgias, visual  complaints, headache, numbness, weakness or ataxia or problems with walking or coordination,  change in mood or  memory.        Current Meds  Medication Sig   acetaminophen (TYLENOL 8 HOUR ARTHRITIS PAIN) 650 MG CR tablet Take 650 mg by mouth every 8 (eight) hours as needed for pain.   albuterol (PROVENTIL HFA;VENTOLIN HFA) 108 (90 Base) MCG/ACT inhaler Inhale 2 puffs into the lungs every 6 (six) hours as needed for wheezing or shortness of breath.   albuterol (PROVENTIL) (2.5 MG/3ML) 0.083% nebulizer solution Take 3 mLs (2.5 mg total) by nebulization every 6 (six) hours as needed for wheezing or shortness of breath.   ALPRAZolam (XANAX) 0.25 MG tablet Take 0.25 mg by mouth daily.   Artificial Saliva (AQUORAL) SOLN by Transmucosal route.   aspirin EC 81 MG tablet Take 1 tablet by mouth daily.   buPROPion (WELLBUTRIN XL) 300 MG 24 hr tablet Take 300 mg by mouth daily.   chlorhexidine (PERIDEX) 0.12 % solution    cloNIDine (CATAPRES) 0.1 MG tablet Take 1 tablet (0.1 mg total) by mouth 2 (two) times daily.   Cyanocobalamin 5000 MCG TBDP Dissolve in the mouth.   dextromethorphan-guaiFENesin (MUCINEX DM) 30-600 MG 12hr tablet Take 1 tablet by mouth 2 (two) times daily.   diphenhydrAMINE (BENADRYL) 25 mg capsule Take 25 mg by mouth every 6 (six) hours as needed.   docusate sodium (COLACE) 100 MG capsule Take 100 mg by mouth in the morning.   famotidine (PEPCID) 20 MG tablet One after supper   hydrochlorothiazide (HYDRODIURIL) 12.5 MG tablet Take 12.5 mg by mouth daily as needed (if blood pressure is greater than 128/82).   levocetirizine (XYZAL) 5 MG tablet Take 5 mg by mouth every evening.   losartan (COZAAR) 50 MG tablet Take 50 mg by mouth daily as needed (if blood pressure is greater than 128/82).   Melatonin 10 MG TABS Take 10 mg by mouth at bedtime.   metoprolol tartrate (LOPRESSOR) 25 MG tablet Take 0.5 tablets (12.5 mg total) by mouth 2 (two) times daily.   Multiple Vitamin (MULTIVITAMIN)  tablet Take 1 tablet by mouth daily.   naproxen sodium (ALEVE) 220 MG tablet Take 220-440 mg by mouth 2 (two) times daily as needed (pain.).   nicotine polacrilex (NICORETTE) 4 MG lozenge Take 4 mg by mouth as needed for smoking cessation.   pantoprazole (PROTONIX) 40 MG tablet TAKE 1 TABLET BY MOUTH ONCE DAILY. TAKE 30-60 MINUTES BEFORE FIRST MEAL OF THE DAY   traMADol (ULTRAM) 50 MG tablet Take 50 mg by mouth every  6 (six) hours as needed.   VEOZAH 45 MG TABS Take 1 tablet by mouth daily.   Vitamin D, Ergocalciferol, (DRISDOL) 1.25 MG (50000 UNIT) CAPS capsule Take by mouth.             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      Objective:    Wts    11/30/2023       116   10/21/23 122 lb (55.3 kg)  09/12/23 122 lb (55.3 kg)  08/10/23 128 lb (58.1 kg)    Vital signs reviewed  11/30/2023  - Note at rest 02 sats  96% on RA   General appearance:    chronically ill slt hoarse wf very loud upper airway wheezing on inps and exp heard across the room    HEENT : Oropharynx  clear      Nasal turbinates nl    NECK :  without  apparent JVD/ palpable Nodes/TM    LUNGS: no acc muscle use,  Nl contour chest which is clear to A and P bilaterally without cough on insp or exp maneuvers   CV:  RRR  no s3 or murmur or increase in P2, and no edema   ABD:  soft and nontender with nl inspiratory excursion in the supine position. No bruits or organomegaly appreciated   MS:  Nl gait/ ext warm without deformities Or obvious joint restrictions  calf tenderness, cyanosis or clubbing    SKIN: warm and dry without lesions    NEURO:  alert, approp, nl sensorium with  no motor or  cerebellar deficits apparent.        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 Increasing small left pleural  effusion with slightly worsened left basilar atelectasis. No  pneumothorax. No acute osseous abnormality.     Assessment

## 2023-11-30 NOTE — Assessment & Plan Note (Signed)
Quit smoking 2017 with dx of lung ca - PFT 02/22/23 >> FEV1 1.95 (96%), FEV1% 80, TLC 4.83 (108%), RV 2.54 (133%), DLCO 52%  worse p SABA and no curvature on inp/exp to indicate true airflow obstruction  - 09/12/2023  After extensive coaching inhaler device,  effectiveness =    80% with hfa so just rx as AB with albuterol more appropriately  09/12/2023   Walked on RA  x  3  lap(s) =  approx 450  ft  @ mod pace, stopped due to end of study with lowest 02 sats 96%  p saba prior   She really has minimal copd/ minimal need for saba which she can continue to use prn and f/u in pulmonary clinic prn

## 2023-11-30 NOTE — Patient Instructions (Addendum)
 My office will be contacting you by phone for referral to [facility]  - if you don't hear back from my office within one week please call us back or notify us thru MyChart and we'll address it right away.    Pulmonary follow up is as needed

## 2023-11-30 NOTE — Assessment & Plan Note (Addendum)
Onset ? 2023 / worse on trelegy 100 > d/c'd 09/09/23 -  max gerd rx 09/12/2023 >>> improved initially off trelegy until "caught cold"  -  referred to ENT 11/30/2023 >>>   Her breathing is borderline stridorous today but yet she denies any increase sob over baseline - she is minimally hoarse and has pseudowheeze on insp/ exp typical of a fixed upper airway obst with neg PET  08/04/23 so doubt tumor directly involved and just needs a quit look with laryngoscope > referred to ENT/ in meantime continue max gerd rx.          Each maintenance medication was reviewed in detail including emphasizing most importantly the difference between maintenance and prns and under what circumstances the prns are to be triggered using an action plan format where appropriate.  Total time for H and P, chart review, counseling, reviewing hfa device(s) and generating customized AVS unique to this office visit / same day charting = 30 min final summary f/u ov

## 2023-12-05 ENCOUNTER — Encounter (INDEPENDENT_AMBULATORY_CARE_PROVIDER_SITE_OTHER): Payer: Self-pay | Admitting: Otolaryngology

## 2023-12-06 ENCOUNTER — Ambulatory Visit: Payer: 59 | Admitting: Internal Medicine

## 2023-12-07 ENCOUNTER — Telehealth: Payer: Self-pay | Admitting: Internal Medicine

## 2023-12-07 NOTE — Telephone Encounter (Signed)
ENT has tried to call her twice

## 2023-12-07 NOTE — Telephone Encounter (Signed)
Patient has not heard regarding her referral to the ENT---call back [phone]

## 2023-12-08 DIAGNOSIS — J441 Chronic obstructive pulmonary disease with (acute) exacerbation: Secondary | ICD-10-CM | POA: Diagnosis not present

## 2023-12-08 DIAGNOSIS — Z299 Encounter for prophylactic measures, unspecified: Secondary | ICD-10-CM | POA: Diagnosis not present

## 2023-12-08 DIAGNOSIS — Z6823 Body mass index (BMI) 23.0-23.9, adult: Secondary | ICD-10-CM | POA: Diagnosis not present

## 2023-12-08 DIAGNOSIS — I700 Atherosclerosis of aorta: Secondary | ICD-10-CM | POA: Diagnosis not present

## 2023-12-08 DIAGNOSIS — I10 Essential (primary) hypertension: Secondary | ICD-10-CM | POA: Diagnosis not present

## 2023-12-19 ENCOUNTER — Ambulatory Visit: Payer: 59 | Admitting: Internal Medicine

## 2023-12-19 ENCOUNTER — Encounter (HOSPITAL_COMMUNITY): Payer: Self-pay

## 2023-12-20 ENCOUNTER — Telehealth (HOSPITAL_COMMUNITY): Payer: Self-pay | Admitting: *Deleted

## 2023-12-20 NOTE — Telephone Encounter (Signed)
Reaching out to patient to offer assistance regarding upcoming cardiac imaging study; pt verbalizes understanding of appt date/time, parking situation and where to check in, pre-test NPO status and medications ordered, and verified current allergies; name and call back number provided for further questions should they arise  Larey Brick RN Navigator Cardiac Imaging [facility] [phone] office [phone] cell  Patient to hold her low dose metoprolol and take 100mg  metoprolol tartrate two hours prior to her cardiac CT scan. She is aware to arrive at 9 AM.

## 2023-12-22 ENCOUNTER — Ambulatory Visit (HOSPITAL_COMMUNITY)
Admission: RE | Admit: 2023-12-22 | Discharge: 2023-12-22 | Disposition: A | Discharge status: Home / Self Care | Payer: 59 | Source: Ambulatory Visit | Attending: Internal Medicine | Admitting: Internal Medicine

## 2023-12-22 DIAGNOSIS — R079 Chest pain, unspecified: Secondary | ICD-10-CM

## 2023-12-22 DIAGNOSIS — I700 Atherosclerosis of aorta: Secondary | ICD-10-CM | POA: Diagnosis not present

## 2023-12-22 DIAGNOSIS — I2510 Atherosclerotic heart disease of native coronary artery without angina pectoris: Secondary | ICD-10-CM

## 2023-12-22 MED ORDER — IOHEXOL 350 MG/ML SOLN
95.0000 mL | Freq: Once | INTRAVENOUS | Status: AC | PRN
  Administered 2023-12-22: 95 mL via INTRAVENOUS

## 2023-12-22 MED ORDER — NITROGLYCERIN 0.4 MG SL SUBL
SUBLINGUAL_TABLET | SUBLINGUAL | Status: AC
  Filled 2023-12-22: qty 2

## 2023-12-22 MED ORDER — NITROGLYCERIN 0.4 MG SL SUBL
0.8000 mg | SUBLINGUAL_TABLET | Freq: Once | SUBLINGUAL | Status: AC
Start: 2023-12-22 — End: 2023-12-22
  Administered 2023-12-22: 0.8 mg via SUBLINGUAL

## 2023-12-26 DIAGNOSIS — Z299 Encounter for prophylactic measures, unspecified: Secondary | ICD-10-CM | POA: Diagnosis not present

## 2023-12-26 DIAGNOSIS — J019 Acute sinusitis, unspecified: Secondary | ICD-10-CM | POA: Diagnosis not present

## 2023-12-26 DIAGNOSIS — R0981 Nasal congestion: Secondary | ICD-10-CM | POA: Diagnosis not present

## 2024-01-02 ENCOUNTER — Encounter (INDEPENDENT_AMBULATORY_CARE_PROVIDER_SITE_OTHER): Payer: Self-pay | Admitting: Otolaryngology

## 2024-01-02 ENCOUNTER — Ambulatory Visit (INDEPENDENT_AMBULATORY_CARE_PROVIDER_SITE_OTHER): Payer: 59 | Admitting: Otolaryngology

## 2024-01-02 VITALS — BP 134/82 | HR 77 | Ht 60.0 in | Wt 121.0 lb

## 2024-01-02 DIAGNOSIS — K219 Gastro-esophageal reflux disease without esophagitis: Secondary | ICD-10-CM

## 2024-01-02 DIAGNOSIS — R0981 Nasal congestion: Secondary | ICD-10-CM

## 2024-01-02 DIAGNOSIS — R0982 Postnasal drip: Secondary | ICD-10-CM

## 2024-01-02 DIAGNOSIS — J3089 Other allergic rhinitis: Secondary | ICD-10-CM

## 2024-01-02 DIAGNOSIS — J329 Chronic sinusitis, unspecified: Secondary | ICD-10-CM | POA: Diagnosis not present

## 2024-01-02 DIAGNOSIS — R053 Chronic cough: Secondary | ICD-10-CM

## 2024-01-02 MED ORDER — MUPIROCIN 2 % EX OINT: 1.0000 | TOPICAL_OINTMENT | Freq: Two times a day (BID) | CUTANEOUS | 0 refills | Status: DC

## 2024-01-02 NOTE — Patient Instructions (Addendum)
-   use saline spray and saline rinses - apply Mupirocin  for 1-2 weeks then stop, then use Vaseline twice a day afterwards - stop Afrin  - schedule CT sinuses  [facility] Nasal Saline Rinse   - start nasal saline rinses with [employer] available over the counter or online to help with nasal congestion

## 2024-01-02 NOTE — Progress Notes (Signed)
 ENT CONSULT:  Reason for Consult: chronic nasal congestion and chronic cough   HPI: Discussed the use of AI scribe software for clinical note transcription with the patient, who gave verbal consent to proceed.  History of Present Illness   The patient is a 68 yoF, with a history of lung cancer treated with chemotherapy and radiation, presents with chronic cough and sinus congestion. They report a significant amount of mucus in the throat, but note that their lungs are clear. The sinus issues began approximately a year ago, with symptoms including sinus headaches, stuffiness, and drainage. Recently, the patient has noticed a buildup in the nasal area, which they describe as dry blood or mucus and it bleeds when it is cleaned. They have a recent history of a sinus infection treated with Cipro  and prednisone .  The patient has a history of smoking, but quit in 2017. They report some residual scarring from pneumonia and calcium deposits from the cancer in her lungs on prior chest imaging. They also report shortness of breath and are currently being evaluated for a potential blocked artery. They express difficulty in getting enough air through the nose, particularly during panic attacks.  The patient has been using Afrin nasal spray once or twice a day for nasal congestion, and Xyzal for allergies until a couple of months ago. They also report a productive cough with creamy yellow mucus. They have recently finished a course of antibiotics for a sinus infection, during which they noticed blood in the mucus when coughing and blowing their nose.  The patient also has a history of acid reflux, for which they take medication twice daily. They report improvement in their cough since starting the medication. They have not had any imaging of their sinuses before. They completed their cancer treatment in December 2017 and continue to have CT scans every six months. They are due for their next scan in February.      Records Reviewed:  Office visit with Dr [name] 11/29/24 Brief patient profile:  68  yoF  quit smoking 2017  self referred to pulmonary clinic in [place]  09/12/2023 by for copd/ GOLD 0 [facility] pt   PFT 02/22/23 >> FEV1 1.95 (96%), FEV1% 80, TLC 4.83 (108%), RV 2.54 (133%), DLCO 52% and  worse p SABA    History of Present Illness  09/12/2023  Pulmonary/ 1st office eval/ [name] / [place] [facility] off trelegy since 09/09/23     Chief Complaint  Patient presents with   Centrilobular emphysema   Dyspnea:  MMRC1 = can walk nl pace, flat grade, can't hurry or go uphills or steps s sob   Cough: sensation of chest congestion creamy x 2017 when finished RT Sleep: level bed /one pillow s resp wakes up dry and thirty  SABA use: once or twice daily  02: none  Rec Pantoprazole  (protonix ) 40 mg   Take  30-60 min before first meal of the day and Pepcid  (famotidine )  20 mg after supper until return to office  Only use your albuterol  as a rescue medication  Also  Ok to try albuterol  15 min before an activity (on alternating days)  that you know would usually make you short of breath   For cough/ congestion >   Mucinex dm  up to maximum of  1200 mg every 12 hours and use the flutter valve as much as you can   Prednisone  10 mg take  4 each am x 2 days,   2 each am x 2  days,  1 each am x 2 days and stop     Please schedule a follow up office visit in 6 weeks, call sooner if needed with all medications /inhalers/ solutions in hand     10/21/2023  f/u ov/[place] [facility]/[place] re: GOLD 0 copd/ emphysema on CT / ? UACS  maint on prn saba -  did  bring meds      Chief Complaint  Patient presents with   COPD      Gold 0  Dyspnea:  improved off trelegy but gets chest tightness reproducibly with house work - has not tried saba prior to ex but helps if sits down and then takes it  Cough: yellowish green worse x sev weeks  Sleeping: flat bed one pillow no   resp cc  SABA use: as above  02: none  Prednisone  for  neck did not help her chest tightness  Rec Also  Ok to try albuterol  15 min before an activity (on alternating days with inhaler, then the nebulizer)  that you know would usually make you short of breath  Zpak should clear mucus  Clonidine  0.1 mg twice daily until you see Dr Rosamond  (can't tol Beta blockers)    Please schedule a follow up visit in 3 months but call sooner if needed    Admit [facility] 11/19/23  Pancreatitis    11/30/2023  f/u ov/[place] office/[place] re: GOLD 0/ emphysema  maint on no rx      Chief Complaint  Patient presents with   COPD   Shortness of Breath  Dyspnea:  able to do [employer] on day of ov slow pace  Cough: mostly yellowish just finished levaquin   Sleeping: on side one neck pillow s   resp cc  SABA use: avg  2-3 x per week hfa/ neb not at all  02: none    No obvious day to day or daytime variability or assoc   hemoptysis or cp or chest tightness, subjective wheeze or overt  symptoms.     Also denies any obvious fluctuation of symptoms with weather or environmental changes or other aggravating or alleviating factors except as outlined above    No unusual exposure hx or h/o childhood pna/ asthma or knowledge of premature birth.   A/P  Onset ? 2023 / worse on trelegy 100 > d/c'd 09/09/23 -  max gerd rx 09/12/2023 >>> improved initially off trelegy until caught cold  -  referred to ENT 11/30/2023 >>>    Her breathing is borderline stridorous today but yet she denies any increase sob over baseline - she is minimally hoarse and has pseudowheeze on insp/ exp typical of a fixed upper airway obst with neg PET  08/04/23 so doubt tumor directly involved and just needs a quit look with laryngoscope > referred to ENT/ in meantime continue max gerd rx.            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    Past Surgical History:  Procedure Laterality Date   ABDOMINAL HYSTERECTOMY     APPENDECTOMY     when had hysterectomy  CARPAL TUNNEL RELEASE Bilateral    ENDOBRONCHIAL ULTRASOUND Bilateral 08/23/2016   Procedure: ENDOBRONCHIAL ULTRASOUND;  Surgeon: Lamar GORMAN Chris, MD;  Location: [facility];  Service: Cardiopulmonary;  Laterality: Bilateral;   HERNIA REPAIR     left inguinal hernia age 37   PORT-A-CATH REMOVAL Left 11/02/2021   Procedure: MINOR REMOVAL PORT-A-CATH;  Surgeon: Mavis Anes, MD;  Location: [facility];  Service: General;  Laterality: Left;  pt to arrive at 8:30   PORTACATH PLACEMENT Left 09/17/2016   Procedure: INSERTION PORT-A-CATH LEFT SUBCLAVIAN;  Surgeon: Anes Mavis, MD;  Location: [facility];  Service: General;  Laterality: Left;   TUBAL LIGATION      Family History  Problem Relation Age of Onset   Hypertension Mother    Lung cancer Mother    Hypertension Father    Diabetes Father    Hypertension Sister    Hypertension Sister    Lung cancer Brother    Colon cancer Neg Hx    Breast cancer Neg Hx     Social History:  reports that she quit smoking about 7 years ago. Her smoking use included cigarettes. She started smoking about 50 years ago. She has a 86 pack-year smoking history. She has never used smokeless tobacco. She reports that she does not drink alcohol and does not use drugs.  Allergies:  Allergies  Allergen Reactions   Clindamycin/Lincomycin Rash   Codeine Anaphylaxis   Lincomycin Rash    Also Clindamycin as it has lincomycin in it Also Clindamycin as it has lincomycin in it   Other Rash   Penicillins Anaphylaxis    Has patient had a PCN reaction causing immediate rash, facial/tongue/throat swelling, SOB or lightheadedness with hypotension: Unknown Has patient had a PCN reaction causing severe rash  involving mucus membranes or skin necrosis: Unknown Has patient had a PCN reaction that required hospitalization: No Has patient had a PCN reaction occurring within the last 10 years: No If all of the above answers are NO, then may proceed with Cephalosporin use. Has patient had a PCN reaction causing immediate rash, facial/tongue/throat swelling, SOB or lightheadedness with hypotension: Unknown Has patient had a PCN reaction causing severe rash involving mucus membranes or skin necrosis: Unknown Has patient had a PCN reaction that required hospitalization: No Has patient had a PCN reaction occurring within the last 10 years: No If all of the above answers are NO, then may proceed with Cephalosporin use.    Vancomycin  Itching    Scalp itching   Sulfa Antibiotics Nausea Only   Hydrocodone  Rash   Tizanidine     Other Reaction(s): Other (See Comments)  Dry mouth    Medications: I have reviewed the patient's current medications.  The PMH, PSH, Medications, Allergies, and SH were reviewed and updated.  ROS: Constitutional: Negative for fever, weight loss and weight gain. Cardiovascular: Negative for chest pain and dyspnea on exertion. Respiratory: Is not experiencing shortness of breath at rest. Gastrointestinal: Negative for nausea and vomiting. Neurological: Negative for headaches. Psychiatric: The patient is not nervous/anxious  Blood pressure 134/82, pulse 77, height 5' (1.524 m), weight 121 lb (54.9 kg), last menstrual period 08/04/1985, SpO2 99%.  PHYSICAL EXAM:  Exam: General: Well-developed, well-nourished Communication and Voice: slightly raspy Respiratory Respiratory effort: Equal inspiration and expiration without stridor Cardiovascular Peripheral Vascular: Warm extremities with equal color/perfusion Eyes: No nystagmus with equal extraocular motion bilaterally Neuro/Psych/Balance: Patient oriented to person, place, and time; Appropriate mood and affect; Gait is  intact with no imbalance; Cranial nerves  I-XII are intact Head and Face Inspection: Normocephalic and atraumatic without mass or lesion Palpation: Facial skeleton intact without bony stepoffs Salivary Glands: No mass or tenderness Facial Strength: Facial motility symmetric and full bilaterally ENT Pinna: External ear intact and fully developed External canal: Canal is patent with intact skin Tympanic Membrane: Clear and mobile External Nose: No scar or anatomic deformity Internal Nose: Septum is deviated to the left. No polyp, or purulence. Mucosal edema and erythema present.  Bilateral inferior turbinate hypertrophy.  Lips, Teeth, and gums: Mucosa and teeth intact and viable TMJ: No pain to palpation with full mobility Oral cavity/oropharynx: No erythema or exudate, no lesions present Nasopharynx: No mass or lesion with intact mucosa Hypopharynx: Intact mucosa without pooling of secretions Larynx Glottic: Full true vocal cord mobility without lesion or mass Supraglottic: Normal appearing epiglottis and AE folds Interarytenoid Space: Moderate pachydermia&edema Subglottic Space: Patent without lesion or edema Neck Neck and Trachea: Midline trachea without mass or lesion Thyroid : No mass or nodularity Lymphatics: No lymphadenopathy  Procedure: Preoperative diagnosis: chronic cough  Postoperative diagnosis:   Same  Procedure: Flexible fiberoptic laryngoscopy  Surgeon: Elena Larry, MD  Anesthesia: Topical lidocaine  and Afrin Complications: None Condition is stable throughout exam  Indications and consent:  The patient presents to the clinic with Indirect laryngoscopy view was incomplete. Thus it was recommended that they undergo a flexible fiberoptic laryngoscopy. All of the risks, benefits, and potential complications were reviewed with the patient preoperatively and verbal informed consent was obtained.  Procedure: The patient was seated upright in the clinic. Topical  lidocaine  and Afrin were applied to the nasal cavity. After adequate anesthesia had occurred, I then proceeded to pass the flexible telescope into the nasal cavity. The nasal cavity was patent without rhinorrhea or polyp. The nasopharynx was also patent without mass or lesion. The base of tongue was visualized and was normal. There were no signs of pooling of secretions in the piriform sinuses. The true vocal folds were mobile bilaterally. There were no signs of glottic or supraglottic mucosal lesion or mass. There was moderate interarytenoid pachydermia and post cricoid edema. The telescope was then slowly withdrawn and the patient tolerated the procedure throughout.    PROCEDURE NOTE: nasal endoscopy  Preoperative diagnosis: chronic sinusitis symptoms  Postoperative diagnosis: same  Procedure: Diagnostic nasal endoscopy (68768)  Surgeon: Elena Larry, M.D.  Anesthesia: Topical lidocaine  and Afrin  H&P REVIEW: The patient's history and physical were reviewed today prior to procedure. All medications were reviewed and updated as well. Complications: None Condition is stable throughout exam Indications and consent: The patient presents with symptoms of chronic sinusitis not responding to previous therapies. All the risks, benefits, and potential complications were reviewed with the patient preoperatively and informed consent was obtained. The time out was completed with confirmation of the correct procedure.   Procedure: The patient was seated upright in the clinic. Topical lidocaine  and Afrin were applied to the nasal cavity. After adequate anesthesia had occurred, the rigid nasal endoscope was passed into the nasal cavity. The nasal mucosa, turbinates, septum, and sinus drainage pathways were visualized bilaterally. This revealed no purulence or significant secretions that might be cultured. There were no polyps or sites of significant inflammation. The mucosa was intact and there was  minimal blood-tinged crusting present b/l along anterior septum and inferior turbinate. The scope was then slowly withdrawn and the patient tolerated the procedure well. There were no complications or blood loss.  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 Increasing small left pleural  effusion with slightly worsened left basilar atelectasis. No  pneumothorax. No acute osseous abnormality.   06/29/23 FINDINGS: Cardiovascular: Normal heart size. Trace pericardial effusion. Normal caliber thoracic aorta with severe atherosclerotic disease. Moderate coronary artery calcifications. Mitral annular calcifications.   Mediastinum/Nodes: Esophagus and thyroid  are unremarkable. Stable prominent paraesophageal lymph nodes. Reference right paraesophageal lymph node measuring 6 mm in short axis on series 2, image 96, unchanged.   Lungs/Pleura: Central airways are patent. Stable bilateral paramediastinal postradiation change. Stable calcified lesion of the right lung apex, likely treated disease. Previously described dominant irregular solid pulmonary nodule of the left upper lobe is decreased in size, measuring 14 x 5 mm on series 4, image 40, previously 14 x 8 mm. Although, an additional solid pulmonary nodule of the left upper lobe is increased in size measuring 16 x 7 mm on series 4, image 45, previously 5 mm.   Patchy irregular consolidations of the left lower lobe are overall improved when compared with the prior exam, however there are new irregular consolidations of the right lower lobe. Additional scattered solid pulmonary nodules are stable. Reference well-defined solid nodule of the right lower lobe measuring mm on series 4, image 104.   Upper Abdomen: Hepatic steatosis.  No acute abnormality.   Musculoskeletal:  No chest wall abnormality. No acute or significant osseous findings.   IMPRESSION: 1. Previously described dominant irregular solid pulmonary nodule of the left upper lobe is decreased in size. However, an additional irregular solid pulmonary nodule of the left upper lobe is increased in size, measuring 16 x 7 mm, previously 5 mm. Consider further evaluation with PET-CT or short-term follow-up in 3 months. 2. Waxing and waning patchy irregular consolidations of the bilateral lower lobes likely due to infection or aspiration. 3. Stable bilateral paramediastinal postradiation change.      Assessment/Plan: Encounter Diagnoses  Name Primary?   Chronic cough Yes   Chronic nasal congestion    Chronic GERD    Chronic sinusitis, unspecified location    Post-nasal drip    Environmental and seasonal allergies     Assessment and Plan    Symptoms of Chronic Sinusitis and nasal congestion Chronic sinusitis with symptoms of sinus congestion, nasal crusting, and epistaxis for the past year, recently exacerbated, s/p a round of abx and steroid with some improvement. No prior sinus imaging. Recent treatment with ciprofloxacin  and prednisone . Examination revealed septal deviation and dry nasal passages without pus or polyps. Discussed risks of continued oxymetazoline use, including rebound congestion. Benefits of saline rinses and topical mupirocin  for symptom relief and healing and to help with crusting. Encouraged regular petrolatum application for moisture. - Order CT scan of sinuses to rule out chronic sinus inflammation - Discontinue oxymetazoline - Initiate saline rinses with Neilmed bottle - Apply Vaseline to nasal passages BID - Prescribe topical mupirocin  if symptoms persist for more than a week or worsen  Chronic Productive Cough Chronic cough with mucus production, recently improved. Lung cancer treated with chemotherapy and radiation, with residual scarring and post-radiation changes.  No current signs of infection or aspiration. Recent pneumonia treated with abx per report. Discussed potential causes including post-radiation changes/chronic lung disease related to hx of smoking, and GERD. Benefits of current reflux management and potential addition of Reflux Gourmet supplement. - Continue current reflux management regimen with Pantoprazole   40 mg BID - Consider Reflux Gourmet supplement after meals - Follow-up after sinus CT scan  Gastroesophageal Reflux Disease (GERD) GERD managed with twice-daily acid reflux medication. Symptoms improved with current regimen. Discussed benefits of current medication and potential addition of Reflux Gourmet supplement for further symptom control. - Continue current reflux management regimen with Pantoprazole  40 mg BID - Consider Reflux Gourmet supplement after meals  Lung Cancer Right-sided lung cancer treated with chemotherapy and radiation, completed in December 2017. Ongoing surveillance with CT scans every six months. Next scan scheduled for February. - Continue surveillance with CT scans every six months - Follow-up with oncologist as scheduled  Follow-up - Schedule sinus CT scan - Follow-up appointment after sinus CT scan - Provide contact information for scheduling scan if not contacted within a week.      Thank you for allowing me to participate in the care of this patient. Please do not hesitate to contact me with any questions or concerns.   Elena Larry, MD Otolaryngology [facility] Phone: [phone] Fax: [phone]    01/03/2024, 1:32 PM

## 2024-01-11 ENCOUNTER — Ambulatory Visit (HOSPITAL_COMMUNITY)
Admission: RE | Admit: 2024-01-11 | Discharge: 2024-01-11 | Disposition: A | Discharge status: Home / Self Care | Payer: 59 | Source: Ambulatory Visit | Attending: Otolaryngology | Admitting: Otolaryngology

## 2024-01-11 DIAGNOSIS — J342 Deviated nasal septum: Secondary | ICD-10-CM | POA: Diagnosis not present

## 2024-01-11 DIAGNOSIS — R053 Chronic cough: Secondary | ICD-10-CM | POA: Diagnosis not present

## 2024-01-11 DIAGNOSIS — J329 Chronic sinusitis, unspecified: Secondary | ICD-10-CM | POA: Diagnosis not present

## 2024-01-22 NOTE — Progress Notes (Unsigned)
Emily Pope, female    DOB: 03/26/1956    MRN: [id number]   Brief patient profile:  64  yowf  quit smoking 2017  self referred to pulmonary clinic in [facility]  09/12/2023 by for copd/ GOLD 0 Sood pt  PFT 02/22/23 >> FEV1 1.95 (96%), FEV1% 80, TLC 4.83 (108%), RV 2.54 (133%), DLCO 52% and  worse p SABA     History of Present Illness  09/12/2023  Pulmonary/ 1st office eval/ [name] / [place] [facility] off trelegy since 09/09/23  Chief Complaint  Patient presents with   Centrilobular emphysema   Dyspnea:  MMRC1 = can walk nl pace, flat grade, can't hurry or go uphills or steps s sob   Cough: sensation of chest congestion creamy x 2017 when finished RT Sleep: level bed /one pillow s resp wakes up dry and thirty  SABA use: once or twice daily  02: none  Rec Pantoprazole (protonix) 40 mg   Take  30-60 min before first meal of the day and Pepcid (famotidine)  20 mg after supper until return to office  Only use your albuterol as a rescue medication  Also  Ok to try albuterol 15 min before an activity (on alternating days)  that you know would usually make you short of breath   For cough/ congestion >   Mucinex dm  up to maximum of  1200 mg every 12 hours and use the flutter valve as much as you can   Prednisone 10 mg take  4 each am x 2 days,   2 each am x 2 days,  1 each am x 2 days and stop    Please schedule a follow up office visit in 6 weeks, call sooner if needed with all medications /inhalers/ solutions in hand     10/21/2023  f/u ov/[place] office/Raeghan Demeter re: GOLD 0 copd/ emphysema on CT / ? UACS  maint on prn saba -  did  bring meds  Chief Complaint  Patient presents with   COPD    Gold 0  Dyspnea:  improved off trelegy but gets chest tightness reproducibly with house work - has not tried saba prior to ex but helps if sits down and then takes it  Cough: yellowish green worse x sev weeks  Sleeping: flat bed one pillow no   resp cc  SABA use: as above  02: none  Prednisone  for neck did not help her chest tightness  Rec Also  Ok to try albuterol 15 min before an activity (on alternating days with inhaler, then the nebulizer)  that you know would usually make you short of breath  Zpak should clear mucus  Clonidine 0.1 mg twice daily until you see Dr Sherril Croon  (can't tol Beta blockers)   Please schedule a follow up visit in 3 months but call sooner if needed   Admit [facility] 11/19/23  Pancreatitis   11/30/2023  f/u ov/[place] office/Tichina Koebel re: GOLD 0/ emphysema  maint on no rx   Chief Complaint  Patient presents with   COPD   Shortness of Breath  Dyspnea:  able to do [employer] on day of ov slow pace  Cough: mostly yellowish just finished levaquin  Sleeping: on side one neck pillow s   resp cc  SABA use: avg  2-3 x per week hfa/ neb not at all  02: none  Rec My office will be contacting you by phone for referral to [facility]      01/23/2024  f/u  ov/[place] office/Errin Whitelaw re: GOLD 0 copd/ emphysema  maint on no rx   Chief Complaint  Patient presents with   Follow-up    3 month follow up   Dyspnea:  [employer] ok  Cough: lots of yellowish mucus in am mostly  Sleeping: on side flat bed one pillow   resp cc  SABA use: hfa and neb but not using either  02: none   Lung cancer screening: due 02/09/23   No obvious day to day or daytime variability or assoc excess/ purulent sputum or mucus plugs or hemoptysis or cp or chest tightness, subjective wheeze or overt sinus or hb symptoms.    Also denies any obvious fluctuation of symptoms with weather or environmental changes or other aggravating or alleviating factors except as outlined above   No unusual exposure hx or h/o childhood pna/ asthma or knowledge of premature birth.  Current Allergies, Complete Past Medical History, Past Surgical History, Family History, and Social History were reviewed in [employer] record.  ROS  The following are not active complaints unless bolded Hoarseness, sore  throat, dysphagia, dental problems, itching, sneezing,  nasal congestion or discharge of excess mucus or purulent secretions, ear ache,   fever, chills, sweats, unintended wt loss or wt gain, classically pleuritic or exertional cp,  orthopnea pnd or arm/hand swelling  or leg swelling, presyncope, palpitations, abdominal pain, anorexia, nausea, vomiting, diarrhea  or change in bowel habits or change in bladder habits, change in stools or change in urine, dysuria, hematuria,  rash, arthralgias, visual complaints, headache, numbness, weakness or ataxia or problems with walking or coordination,  change in mood or  memory.        Current Meds  Medication Sig   acetaminophen (TYLENOL 8 HOUR ARTHRITIS PAIN) 650 MG CR tablet Take 650 mg by mouth every 8 (eight) hours as needed for pain.   albuterol (PROVENTIL HFA;VENTOLIN HFA) 108 (90 Base) MCG/ACT inhaler Inhale 2 puffs into the lungs every 6 (six) hours as needed for wheezing or shortness of breath.   albuterol (PROVENTIL) (2.5 MG/3ML) 0.083% nebulizer solution Take 3 mLs (2.5 mg total) by nebulization every 6 (six) hours as needed for wheezing or shortness of breath.   ALPRAZolam (XANAX) 0.25 MG tablet Take 0.25 mg by mouth daily.   aspirin EC 81 MG tablet Take 1 tablet by mouth daily.   buPROPion (WELLBUTRIN XL) 300 MG 24 hr tablet Take 300 mg by mouth daily.   cloNIDine (CATAPRES) 0.1 MG tablet Take 1 tablet (0.1 mg total) by mouth 2 (two) times daily.   Cyanocobalamin 5000 MCG TBDP Dissolve in the mouth.   dextromethorphan-guaiFENesin (MUCINEX DM) 30-600 MG 12hr tablet Take 1 tablet by mouth 2 (two) times daily.   diphenhydrAMINE (BENADRYL) 25 mg capsule Take 25 mg by mouth every 6 (six) hours as needed.   docusate sodium (COLACE) 100 MG capsule Take 100 mg by mouth in the morning.   famotidine (PEPCID) 20 MG tablet One after supper   hydrochlorothiazide (HYDRODIURIL) 12.5 MG tablet Take 12.5 mg by mouth daily as needed (if blood pressure is greater  than 128/82).   levocetirizine (XYZAL) 5 MG tablet Take 5 mg by mouth every evening.   losartan (COZAAR) 50 MG tablet Take 50 mg by mouth daily as needed (if blood pressure is greater than 128/82).   Melatonin 10 MG TABS Take 10 mg by mouth at bedtime.   metoprolol tartrate (LOPRESSOR) 25 MG tablet Take 0.5 tablets (12.5 mg total) by mouth  2 (two) times daily.   Multiple Vitamin (MULTIVITAMIN) tablet Take 1 tablet by mouth daily.   naproxen sodium (ALEVE) 220 MG tablet Take 220-440 mg by mouth 2 (two) times daily as needed (pain.).   nicotine polacrilex (NICORETTE) 4 MG lozenge Take 4 mg by mouth as needed for smoking cessation.   pantoprazole (PROTONIX) 40 MG tablet TAKE 1 TABLET BY MOUTH ONCE DAILY. TAKE 30-60 MINUTES BEFORE FIRST MEAL OF THE DAY   traMADol (ULTRAM) 50 MG tablet Take 50 mg by mouth every 6 (six) hours as needed.   VEOZAH 45 MG TABS Take 1 tablet by mouth daily.   Vitamin D, Ergocalciferol, (DRISDOL) 1.25 MG (50000 UNIT) CAPS capsule Take by mouth.             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      Objective:    Wts  01/23/2024        120  11/30/2023       116   10/21/23 122 lb (55.3 kg)  09/12/23 122 lb (55.3 kg)  08/10/23 128 lb (58.1 kg)    Vital signs reviewed  01/23/2024  - Note at rest 02 sats  98% on RA   General appearance:    amb wf / prominent pseudowwheeze     HEENT : Oropharynx  clear      Nasal turbinates mild edema    NECK :  without  apparent JVD/ palpable Nodes/TM    LUNGS: no acc muscle use,  Nl contour chest which is clear to A and P bilaterally without cough on insp or exp maneuvers   CV:  RRR  no s3 or murmur or increase  in P2, and no edema   ABD:  soft and nontender   MS:  Gait nl   ext warm without deformities Or obvious joint restrictions  calf tenderness, cyanosis or clubbing    SKIN: warm and dry without lesions    NEURO:  alert, approp, nl sensorium with  no motor or cerebellar deficits apparent.       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 Increasing small left pleural  effusion with slightly worsened left basilar atelectasis. No  pneumothorax. No acute osseous abnormality.     Assessment

## 2024-01-23 ENCOUNTER — Ambulatory Visit (INDEPENDENT_AMBULATORY_CARE_PROVIDER_SITE_OTHER): Payer: Medicare Other | Admitting: Internal Medicine

## 2024-01-23 ENCOUNTER — Encounter: Payer: Self-pay | Admitting: Internal Medicine

## 2024-01-23 VITALS — BP 101/68 | HR 74 | Ht 60.0 in | Wt 120.0 lb

## 2024-01-23 DIAGNOSIS — J449 Chronic obstructive pulmonary disease, unspecified: Secondary | ICD-10-CM | POA: Diagnosis not present

## 2024-01-23 DIAGNOSIS — R058 Other specified cough: Secondary | ICD-10-CM

## 2024-01-23 MED ORDER — RABEPRAZOLE SODIUM 20 MG PO TBEC: DELAYED_RELEASE_TABLET | ORAL | 2 refills | Status: DC

## 2024-01-23 NOTE — Patient Instructions (Addendum)
Try substituting  aciphex 20 mg  for pantorazole 40  (still  Take it 30-60 min before first meal of the day)    Please remember to go to the lab department   for your tests - we will call you with the results when they are available.      Please schedule a follow up visit in 6 months but call sooner if needed

## 2024-01-24 ENCOUNTER — Telehealth: Payer: Self-pay | Admitting: Internal Medicine

## 2024-01-24 ENCOUNTER — Encounter: Payer: Self-pay | Admitting: Internal Medicine

## 2024-01-24 DIAGNOSIS — I700 Atherosclerosis of aorta: Secondary | ICD-10-CM | POA: Diagnosis not present

## 2024-01-24 DIAGNOSIS — J441 Chronic obstructive pulmonary disease with (acute) exacerbation: Secondary | ICD-10-CM | POA: Diagnosis not present

## 2024-01-24 DIAGNOSIS — K219 Gastro-esophageal reflux disease without esophagitis: Secondary | ICD-10-CM | POA: Diagnosis not present

## 2024-01-24 DIAGNOSIS — I10 Essential (primary) hypertension: Secondary | ICD-10-CM | POA: Diagnosis not present

## 2024-01-24 DIAGNOSIS — Z299 Encounter for prophylactic measures, unspecified: Secondary | ICD-10-CM | POA: Diagnosis not present

## 2024-01-24 DIAGNOSIS — E785 Hyperlipidemia, unspecified: Secondary | ICD-10-CM | POA: Diagnosis not present

## 2024-01-24 NOTE — Telephone Encounter (Signed)
Returned call to pt who states that she was seen by her PCP on today and started on Crestor. Pt asking what else should be done and when she is to follow-up. Please advise.

## 2024-01-24 NOTE — Assessment & Plan Note (Signed)
Onset ? 2023 / worse on trelegy 100 > d/c'd 09/09/23 -  max gerd rx 09/12/2023 >>> improved initially off trelegy until "caught cold"  -  referred to ENT 11/30/2023 >>> seen  01/02/24 c/w chronic sinusitis > CT sinus 01/11/24 1. Small bilateral maxillary sinus mucous retention cysts. Otherwise clear paranasal sinuses with patent sinus drainage pathways. 2. Leftward nasal septal deviation.  Overall better with no more stridor appreciated  Not tol protonix due to excess gas > try actiphex 20 mg Take 30-60 min before first meal of the day   F/u ent as indicated   F/u here q 6 m sooner if needed on same rx         Each maintenance medication was reviewed in detail including emphasizing most importantly the difference between maintenance and prns and under what circumstances the prns are to be triggered using an action plan format where appropriate.  Total time for H and P, chart review, counseling, reviewing nasal device(s) and generating customized AVS unique to this office visit / same day charting = 26 min

## 2024-01-24 NOTE — Telephone Encounter (Signed)
Patient would like a call back to discuss CT results

## 2024-01-24 NOTE — Assessment & Plan Note (Signed)
Quit smoking 2017 with dx of lung ca - PFT 02/22/23 >> FEV1 1.95 (96%), FEV1% 80, TLC 4.83 (108%), RV 2.54 (133%), DLCO 52%  worse p SABA and no curvature on inp/exp to indicate true airflow obstruction  - 09/12/2023  After extensive coaching inhaler device,  effectiveness =    80% with hfa so just rx as AB with albuterol more appropriately  09/12/2023   Walked on RA  x  3  lap(s) =  approx 450  ft  @ mod pace, stopped due to end of study with lowest 02 sats 96%  p saba prior  - Allergy screen 01/23/2024 >  Eos 0. /  IgE    Not limited by doe or having aecopd  or AB at this point > no change in rx

## 2024-01-26 NOTE — Telephone Encounter (Signed)
Pt notified of Dr. Antoine Poche response, and agrees with plan of care.

## 2024-02-03 ENCOUNTER — Other Ambulatory Visit: Payer: Self-pay | Admitting: Internal Medicine

## 2024-02-03 ENCOUNTER — Ambulatory Visit (HOSPITAL_COMMUNITY)
Admission: RE | Admit: 2024-02-03 | Discharge: 2024-02-03 | Disposition: A | Discharge status: Home / Self Care | Payer: 59 | Source: Ambulatory Visit | Attending: Internal Medicine | Admitting: Internal Medicine

## 2024-02-03 DIAGNOSIS — R079 Chest pain, unspecified: Secondary | ICD-10-CM | POA: Insufficient documentation

## 2024-02-03 LAB — ECHOCARDIOGRAM COMPLETE
AR max vel: 2.09 cm2
AV Area VTI: 2.23 cm2
AV Area mean vel: 2.08 cm2
AV Mean grad: 4 mm[Hg]
AV Peak grad: 6.5 mm[Hg]
Ao pk vel: 1.27 m/s
Area-P 1/2: 3.79 cm2
MV VTI: 2.35 cm2
S' Lateral: 2 cm

## 2024-02-03 MED ORDER — PERFLUTREN LIPID MICROSPHERE
1.0000 mL | INTRAVENOUS | Status: AC | PRN
  Administered 2024-02-03: 5 mL via INTRAVENOUS

## 2024-02-03 NOTE — Telephone Encounter (Signed)
 Dr [name]- are we refilling her clonidine ?

## 2024-02-06 LAB — CBC WITH DIFFERENTIAL/PLATELET
Basophils Absolute: 0.1 10*3/uL (ref 0.0–0.2)
Basos: 1 %
EOS (ABSOLUTE): 0.3 10*3/uL (ref 0.0–0.4)
Eos: 6 %
Hematocrit: 34.8 % (ref 34.0–46.6)
Hemoglobin: 11.7 g/dL (ref 11.1–15.9)
Immature Grans (Abs): 0 10*3/uL (ref 0.0–0.1)
Immature Granulocytes: 1 %
Lymphocytes Absolute: 1.2 10*3/uL (ref 0.7–3.1)
Lymphs: 28 %
MCH: 34.4 pg — ABNORMAL HIGH (ref 26.6–33.0)
MCHC: 33.6 g/dL (ref 31.5–35.7)
MCV: 102 fL — ABNORMAL HIGH (ref 79–97)
Monocytes Absolute: 0.5 10*3/uL (ref 0.1–0.9)
Monocytes: 12 %
Neutrophils Absolute: 2.3 10*3/uL (ref 1.4–7.0)
Neutrophils: 52 %
Platelets: 401 10*3/uL (ref 150–450)
RBC: 3.4 x10E6/uL — ABNORMAL LOW (ref 3.77–5.28)
RDW: 12.3 % (ref 11.7–15.4)
WBC: 4.3 10*3/uL (ref 3.4–10.8)

## 2024-02-06 LAB — ALPHA-1-ANTITRYPSIN PHENOTYP: A-1 Antitrypsin: 146 mg/dL (ref 101–187)

## 2024-02-06 LAB — IGE: IgE (Immunoglobulin E), Serum: 15 [IU]/mL (ref 6–495)

## 2024-02-09 ENCOUNTER — Other Ambulatory Visit (HOSPITAL_COMMUNITY): Payer: 59

## 2024-02-09 ENCOUNTER — Other Ambulatory Visit: Payer: 59

## 2024-02-09 DIAGNOSIS — J029 Acute pharyngitis, unspecified: Secondary | ICD-10-CM | POA: Diagnosis not present

## 2024-02-09 DIAGNOSIS — J069 Acute upper respiratory infection, unspecified: Secondary | ICD-10-CM | POA: Diagnosis not present

## 2024-02-09 DIAGNOSIS — J449 Chronic obstructive pulmonary disease, unspecified: Secondary | ICD-10-CM | POA: Diagnosis not present

## 2024-02-09 DIAGNOSIS — I700 Atherosclerosis of aorta: Secondary | ICD-10-CM | POA: Diagnosis not present

## 2024-02-09 DIAGNOSIS — J9610 Chronic respiratory failure, unspecified whether with hypoxia or hypercapnia: Secondary | ICD-10-CM | POA: Diagnosis not present

## 2024-02-09 DIAGNOSIS — Z299 Encounter for prophylactic measures, unspecified: Secondary | ICD-10-CM | POA: Diagnosis not present

## 2024-02-10 ENCOUNTER — Ambulatory Visit (HOSPITAL_COMMUNITY)
Admission: RE | Admit: 2024-02-10 | Discharge: 2024-02-10 | Disposition: A | Discharge status: Home / Self Care | Payer: 59 | Source: Ambulatory Visit | Attending: Hematology | Admitting: Hematology

## 2024-02-10 ENCOUNTER — Inpatient Hospital Stay: Payer: Medicare Other | Attending: Hematology

## 2024-02-10 DIAGNOSIS — R590 Localized enlarged lymph nodes: Secondary | ICD-10-CM | POA: Diagnosis not present

## 2024-02-10 DIAGNOSIS — Z87891 Personal history of nicotine dependence: Secondary | ICD-10-CM | POA: Insufficient documentation

## 2024-02-10 DIAGNOSIS — C3411 Malignant neoplasm of upper lobe, right bronchus or lung: Secondary | ICD-10-CM | POA: Insufficient documentation

## 2024-02-10 DIAGNOSIS — Z9221 Personal history of antineoplastic chemotherapy: Secondary | ICD-10-CM | POA: Diagnosis not present

## 2024-02-10 DIAGNOSIS — J479 Bronchiectasis, uncomplicated: Secondary | ICD-10-CM | POA: Diagnosis not present

## 2024-02-10 DIAGNOSIS — C3490 Malignant neoplasm of unspecified part of unspecified bronchus or lung: Secondary | ICD-10-CM | POA: Diagnosis not present

## 2024-02-10 DIAGNOSIS — Z85118 Personal history of other malignant neoplasm of bronchus and lung: Secondary | ICD-10-CM | POA: Insufficient documentation

## 2024-02-10 DIAGNOSIS — Z923 Personal history of irradiation: Secondary | ICD-10-CM | POA: Diagnosis not present

## 2024-02-10 LAB — CBC WITH DIFFERENTIAL/PLATELET
Abs Immature Granulocytes: 0.05 10*3/uL (ref 0.00–0.07)
Basophils Absolute: 0 10*3/uL (ref 0.0–0.1)
Basophils Relative: 0 %
Eosinophils Absolute: 0 10*3/uL (ref 0.0–0.5)
Eosinophils Relative: 0 %
HCT: 33.2 % — ABNORMAL LOW (ref 36.0–46.0)
Hemoglobin: 11.4 g/dL — ABNORMAL LOW (ref 12.0–15.0)
Immature Granulocytes: 1 %
Lymphocytes Relative: 15 %
Lymphs Abs: 1.4 10*3/uL (ref 0.7–4.0)
MCH: 34.4 pg — ABNORMAL HIGH (ref 26.0–34.0)
MCHC: 34.3 g/dL (ref 30.0–36.0)
MCV: 100.3 fL — ABNORMAL HIGH (ref 80.0–100.0)
Monocytes Absolute: 0.6 10*3/uL (ref 0.1–1.0)
Monocytes Relative: 7 %
Neutro Abs: 6.9 10*3/uL (ref 1.7–7.7)
Neutrophils Relative %: 77 %
Platelets: 358 10*3/uL (ref 150–400)
RBC: 3.31 MIL/uL — ABNORMAL LOW (ref 3.87–5.11)
RDW: 12.8 % (ref 11.5–15.5)
WBC: 9 10*3/uL (ref 4.0–10.5)
nRBC: 0 % (ref 0.0–0.2)

## 2024-02-10 LAB — COMPREHENSIVE METABOLIC PANEL
ALT: 13 U/L (ref 0–44)
AST: 14 U/L — ABNORMAL LOW (ref 15–41)
Albumin: 4.1 g/dL (ref 3.5–5.0)
Alkaline Phosphatase: 95 U/L (ref 38–126)
Anion gap: 12 (ref 5–15)
BUN: 19 mg/dL (ref 8–23)
CO2: 26 mmol/L (ref 22–32)
Calcium: 9.7 mg/dL (ref 8.9–10.3)
Chloride: 102 mmol/L (ref 98–111)
Creatinine, Ser: 1.17 mg/dL — ABNORMAL HIGH (ref 0.44–1.00)
GFR, Estimated: 51 mL/min — ABNORMAL LOW (ref >60)
Glucose, Bld: 109 mg/dL — ABNORMAL HIGH (ref 70–99)
Potassium: 3.8 mmol/L (ref 3.5–5.1)
Sodium: 140 mmol/L (ref 135–145)
Total Bilirubin: 0.5 mg/dL (ref 0.0–1.2)
Total Protein: 7.6 g/dL (ref 6.5–8.1)

## 2024-02-10 LAB — POCT I-STAT CREATININE: Creatinine, Ser: 1.2 mg/dL — ABNORMAL HIGH (ref 0.44–1.00)

## 2024-02-10 MED ORDER — IOHEXOL 300 MG/ML  SOLN
75.0000 mL | Freq: Once | INTRAMUSCULAR | Status: AC | PRN
  Administered 2024-02-10: 75 mL via INTRAVENOUS

## 2024-02-16 ENCOUNTER — Inpatient Hospital Stay: Payer: Medicare Other | Admitting: Hematology

## 2024-02-20 ENCOUNTER — Inpatient Hospital Stay (HOSPITAL_BASED_OUTPATIENT_CLINIC_OR_DEPARTMENT_OTHER): Payer: 59 | Admitting: Hematology

## 2024-02-20 VITALS — BP 97/72 | HR 85 | Temp 96.8°F | Resp 20 | Wt 121.3 lb

## 2024-02-20 DIAGNOSIS — C3411 Malignant neoplasm of upper lobe, right bronchus or lung: Secondary | ICD-10-CM

## 2024-02-20 DIAGNOSIS — Z9221 Personal history of antineoplastic chemotherapy: Secondary | ICD-10-CM | POA: Diagnosis not present

## 2024-02-20 DIAGNOSIS — Z87891 Personal history of nicotine dependence: Secondary | ICD-10-CM | POA: Diagnosis not present

## 2024-02-20 DIAGNOSIS — Z923 Personal history of irradiation: Secondary | ICD-10-CM | POA: Diagnosis not present

## 2024-02-20 NOTE — Patient Instructions (Addendum)
 [facility] at [facility] Discharge Instructions   You were seen and examined today by Dr. Ellin Saba.  He reviewed the results of your lab work which are normal/stable.   He reviewed the results of your CT scan which is stable.   We will see you back in 6 months. We will repeat lab work and a CT scan prior to this visit.   Return as scheduled.    Thank you for choosing [facility] at [facility] to provide your oncology and hematology care.  To afford each patient quality time with our provider, please arrive at least 15 minutes before your scheduled appointment time.   If you have a lab appointment with the Cancer Center please come in thru the Main Entrance and check in at the main information desk.  You need to re-schedule your appointment should you arrive 10 or more minutes late.  We strive to give you quality time with our providers, and arriving late affects you and other patients whose appointments are after yours.  Also, if you no show three or more times for appointments you may be dismissed from the clinic at the providers discretion.     Again, thank you for choosing [facility].  Our hope is that these requests will decrease the amount of time that you wait before being seen by our physicians.       _____________________________________________________________  Should you have questions after your visit to [facility], please contact our office at [phone] and follow the prompts.  Our office hours are 8:00 a.m. and 4:30 p.m. Monday - Friday.  Please note that voicemails left after 4:00 p.m. may not be returned until the following business day.  We are closed weekends and major holidays.  You do have access to a nurse 24-7, just call the main number to the clinic [phone] and do not press any options, hold on the line and a nurse will answer the phone.    For prescription refill requests, have your  pharmacy contact our office and allow 72 hours.    Due to Covid, you will need to wear a mask upon entering the hospital. If you do not have a mask, a mask will be given to you at the Main Entrance upon arrival. For doctor visits, patients may have 1 support person age 22 or older with them. For treatment visits, patients can not have anyone with them due to social distancing guidelines and our immunocompromised population.

## 2024-02-20 NOTE — Progress Notes (Signed)
 [facility] [address] [place] [address]    Clinic Day:  02/22/2024  Referring physician: Ignatius Specking, MD  Patient Care Team: Ignatius Specking, MD as PCP - General (Internal Medicine) Marjo Bicker, MD as PCP - Cardiology (Cardiology) Jena Gauss Gerrit Friends, MD as Consulting Physician (Gastroenterology) Nyoka Cowden, MD as Consulting Physician (Pulmonary Disease)   ASSESSMENT & PLAN:   Assessment: 1.  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 -Durvalumab consolidation from 01/25/2017, discontinued on 05/09/2017 secondary to pneumonitis. -PET scan on 11/20/2018 showed further reduction in activity associated with the right apical consolidation, likely treatment related, without current evidence of malignancy.  Chronically stable 7 mm nodule in the right lateral costophrenic angle without current hypermetabolic activity. -CT of the chest on 06/01/2019 which showed treatment effect with the right upper lobe with no evidence of local recurrence or residual disease. -CT chest 12/11/2019 showed no changes in right apical consolidation.  A new 3 mm left upper lobe nodule is new since the prior exam.  If new left upper lobe nodule has increased in size this would warrant a PET CT followed by possible biopsy if FDG avid. -CT of the chest done on 06/09/2020 showed stable appearance of posttreatment changes involving the right upper lobe with compensatory hyperinflation of the right middle lobe and right lower lobe.  Stable subpleural nodules in the right lower lobe and 3 mm left upper lobe lung nodule.  Previously noted 5 mm  peribronchovascular nodule now measures 3 mm and is likely postinflammatory. -CT chest on 01/22/2021  at [facility] showed new bilateral lower lobe airspace opacities concerning for pneumonia.  Trace bilateral effusions.  No evidence of pulmonary embolism.  Postradiation changes in the right upper lobe, unchanged.   2.  COPD/pneumonitis: -She is using albuterol and Trelegy.   3.  Vitamin D deficiency: -Labs done on 06/09/2020 showed vitamin D level 22.66. -She stopped taking the combination vitamin D and calcium due to constipation. -She will start taking an vitamin D3 by itself. -We will recheck her labs at her next visit.   4.  Iron deficiency anemia: -She is taking oral iron tablets daily. -Labs done on 06/09/2020 showed hemoglobin 12.4.    Plan: 1.  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 - Reviewed CT chest (02/10/2024): Stable posttreatment changes in the right paramediastinal lung zone with stable mild subcarinal lymphadenopathy.  Stable subcentimeter bilateral lung nodules are also seen. - Labs from 02/10/2024: Normal LFTs.  Creatinine mildly elevated at 1.17.  CBC shows mild microcytic anemia. - Recommend follow-up in 6 months with repeat CT scan as well as routine labs and anemia labs.    Orders Placed This Encounter  Procedures   CT Chest Wo Contrast    Standing Status:   Future    Expected Date:   08/19/2024    Expiration Date:   02/19/2025    Preferred imaging location?:   [facility]   CBC with Differential    Standing Status:   Future    Expected Date:   08/22/2024    Expiration Date:   02/19/2025   Comprehensive metabolic panel    Standing Status:   Future  Expected Date:   08/22/2024    Expiration Date:   02/19/2025   Ferritin    Standing Status:   Future    Expected Date:   08/22/2024    Expiration Date:   02/19/2025   Vitamin B12    Standing Status:   Future    Expected Date:   08/22/2024    Expiration Date:   02/19/2025   Iron and TIBC ([facility]  ONLY)    Standing Status:   Future    Expected Date:   08/22/2024    Expiration Date:   02/19/2025   Folate    Standing Status:   Future    Expected Date:   08/22/2024    Expiration Date:   02/19/2025      I,Katie Daubenspeck,acting as a scribe for [name], MD.,have documented all relevant documentation on the behalf of [name], MD,as directed by  [name], MD while in the presence of [name], MD.   I, [name] MD, have reviewed the above documentation for accuracy and completeness, and I agree with the above.   [name], MD   2/26/20255:43 PM  CHIEF COMPLAINT:   Diagnosis: right lung cancer    Cancer Staging  Primary cancer of right upper lobe of lung [facility]) Staging form: Lung, AJCC 7th Edition - Clinical stage from 09/27/2016: Stage IIIB (T4, N3, M0) - Signed by Ellouise Newer, PA-C on 09/27/2016    Prior Therapy: 1. Chemoradiation with carboplatin and paclitaxel from 09/20/2016 to 10/25/2016 with consolidation to 12/06/2016. 2. Durvalumab consolidation from 01/25/2017 to 05/09/2017.  Current Therapy:  surveillance    HISTORY OF PRESENT ILLNESS:   Oncology History Overview Note  Stage IIIB (T4 N3 M0) carcinoma of RUL of lung, immunophenotyping consistent with adenocarcinoma.  S/P concomitant chemoradiation with carboplatin/paclitaxel (09/20/2016- 10/25/2016) followed by 2 cycle of consolidative chemotherapy with carboplatin/paclitaxel (11/15/2016- 12/06/2016).  Unable to tolerate consolidative durvalumab (Imfinzi) beginning on 01/26/2016 and stopped 05/2017.   AND Imfinzi-induced pneumonitis, grade 2, resulting in holding Imfinzi from 04/25/2017- 05/09/2017 with corticosteroids treatment with taper.  This resolved/improved with steroids. Developed pnemonitis again after rechallenge with imfinzi. On steroid taper again.     Primary cancer of right upper lobe of lung (HCC)  08/04/2016 PET scan   PET  IMPRESSION: 1. 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 Mass is confluent with the right superior hilum and right lower tracheal wall, suggestive of a T4 primary tumor. 2. Postobstructive pneumonia in the right upper lobe. 3. Hypermetabolic ipsilateral and contralateral mediastinal and contralateral hilar lymphadenopathy, suggestive of N3 nodal disease.   08/24/2016 Procedure   Bronchoscopy   08/25/2016 Pathology Results   Diagnosis Endobronchial biopsy, RUL - POORLY DIFFERENTIATED NON-SMALL CELL CARCINOMA. The immunophenotype is consistent with poorly differentiated adenocarcinoma.   09/10/2016 Imaging   MRI brain- No metastatic disease or acute intracranial abnormality.   09/20/2016 - 10/25/2016 Chemotherapy   Carboplatin/Paclitaxel weekly x 6 with XRT.   09/20/2016 - 10/25/2016 Radiation Therapy     11/15/2016 - 12/06/2016 Chemotherapy   Carboplatin/paclitaxel every 21 days x 2 cycles.   12/30/2016 Imaging   CT CAP- 1. Central right upper lobe lung mass has mildly decreased since 11/21/2016 chest CT angiogram and significantly decreased since 08/04/2016 PET-CT. 2. No pathologically enlarged thoracic lymph nodes. Previously described hypermetabolic thoracic adenopathy on the 08/04/2016 PET-CT has decreased in size. 3. Previously described bilateral lower  lobe pulmonary nodules are stable in size. 4. New solitary subsolid 4 mm right middle lobe pulmonary nodule is nonspecific, and may be inflammatory. Recommend attention on a follow-up chest CT in 3 months. 5. No definite metastatic disease in the abdomen. Tiny sub 5 mm low-attenuation lesions in the liver are too small to characterize, favor benign. 6. Aortic atherosclerosis.  One vessel coronary atherosclerosis. 7. Moderate emphysema.   01/25/2017 -  Chemotherapy   Durvalumab  (Imfinzi) x 52 weeks.    03/31/2017 Imaging   CT chest with contrast: Stable size of partially calcified mass in the posterior right upper lobe.   Increased airspace disease in paramediastinal right upper and superior right lower lobes. Differential diagnosis includes radiation pneumonitis, drug reaction, and infection.   Stable sub-cm right lower lobe pulmonary nodule.   No evidence of lymphadenopathy or pleural effusion.   Emphysema.   Aortic and coronary artery atherosclerosis.   04/25/2017 Adverse Reaction   Progressive cough, concerning for pneumonitis.   04/25/2017 Treatment Plan Change   Progressive cough concerning for pneumonitis.  Treatment held.  Managed by steroids.   05/09/2017 Treatment Plan Change   Restart Imfinzi with improvement of cough, Grade 1.   05/27/2017 Imaging   CT CHEST WITH CONTRAST IMPRESSION: 1. Similar size of partially calcified posterior right apical lung mass. 2. Similar to minimal increase in surrounding radiation fibrosis. 3. Subtle areas of mosaic attenuation are greater on the right. Likely attributed to mild ground-glass opacity. In the appropriate clinical setting, this could represent drug toxicity induced pneumonitis. 4.  Coronary artery atherosclerosis. Aortic atherosclerosis. 5. Similar right lower lobe pulmonary nodule.   05/27/2017 Adverse Reaction   Developed worsening cough and SOB again after imfinzi. Grade 2. Placed on long steroid taper.  Permanently discontinue any further imfinzi treatments.   07/29/2017 Imaging   CT chest/abd/pelvis: IMPRESSION: 1. Stable post treatment related changes of mass-like radiation fibrosis in the upper right lung with similar appearance of densely calcified mass in the apex of the right upper lobe. No finding to suggest local recurrence of disease or metastatic disease in the chest, abdomen or pelvis on today's examination. 2. 7 mm subpleural nodule in the periphery of the right lower lobe is  stable and favored to be benign. Continued attention on followup studies is recommended to ensure stability. 3. Aortic atherosclerosis, in addition to left anterior descending coronary artery disease. Please note that although the presence of coronary artery calcium documents the presence of coronary artery disease, the severity of this disease and any potential stenosis cannot be assessed on this non-gated CT examination. Assessment for potential risk factor modification, dietary therapy or pharmacologic therapy may be warranted, if clinically indicated. 4. There are calcifications of the mitral valve. Echocardiographic correlation for evaluation of potential valvular dysfunction may be warranted if clinically indicated. 5. Mild diffuse bronchial wall thickening with mild to moderate centrilobular and mild paraseptal emphysema; imaging findings suggestive of underlying COPD. 6. Tip of left subclavian Port-A-Cath is now misdirected into the subclavian vein.      INTERVAL HISTORY:   Emily Pope is a 68 y.o. female presenting to clinic today for follow up of right lung cancer. She was last seen by me on 08/10/23.  Since her last visit, she underwent surveillance chest CT on 02/10/24 showing: stable exam-- stable posttreatment changes in right paramediastinal lung, mild subcarinal lymphadenopathy, and sub-centimeter bilateral pulmonary nodules (not hypermetabolic on previous PET); no new or progressive disease.  Today, she states that she is doing well  overall. Her appetite level is at 100%. Her energy level is at 50%.  PAST MEDICAL HISTORY:   Past Medical History: Past Medical History:  Diagnosis Date   Anxiety    Arthritis    Chronic bronchitis (HCC)    COPD (chronic obstructive pulmonary disease) (HCC)    Essential hypertension    GERD (gastroesophageal reflux disease)    Headache    History of pneumonia    Pneumonitis    Primary cancer of right upper lobe of lung (HCC)    Stage  IIIb adenocarcinoma    Surgical History: Past Surgical History:  Procedure Laterality Date   ABDOMINAL HYSTERECTOMY     APPENDECTOMY     when had hysterectomy   CARPAL TUNNEL RELEASE Bilateral    ENDOBRONCHIAL ULTRASOUND Bilateral 08/23/2016   Procedure: ENDOBRONCHIAL ULTRASOUND;  Surgeon: Leslye Peer, MD;  Location: [facility];  Service: Cardiopulmonary;  Laterality: Bilateral;   HERNIA REPAIR     left inguinal hernia age 47   PORT-A-CATH REMOVAL Left 11/02/2021   Procedure: MINOR REMOVAL PORT-A-CATH;  Surgeon: Franky Macho, MD;  Location: [facility];  Service: General;  Laterality: Left;  pt to arrive at 8:30   PORTACATH PLACEMENT Left 09/17/2016   Procedure: INSERTION PORT-A-CATH LEFT SUBCLAVIAN;  Surgeon: Franky Macho, MD;  Location: [facility];  Service: General;  Laterality: Left;   TUBAL LIGATION      Social History: Social History   Socioeconomic History   Marital status: Divorced    Spouse name: Not on file   Number of children: Not on file   Years of education: Not on file   Highest education level: Not on file  Occupational History   Not on file  Tobacco Use   Smoking status: Former    Current packs/day: 0.00    Average packs/day: 2.0 packs/day for 43.0 years (86.0 ttl pk-yrs)    Types: Cigarettes    Start date: 07/20/1973    Quit date: 07/20/2016    Years since quitting: 7.5   Smokeless tobacco: Never  Vaping Use   Vaping status: Never Used  Substance and Sexual Activity   Alcohol use: No   Drug use: No   Sexual activity: Not on file  Other Topics Concern   Not on file  Social History Narrative   Not on file   Social Drivers of Health   Financial Resource Strain: Low Risk  (11/06/2023)   Received from [facility]   Overall Financial Resource Strain (CARDIA)    Difficulty of Paying Living Expenses: Not hard at all  Food Insecurity: No Food Insecurity (11/06/2023)   Received from [facility]   Hunger Vital Sign    Worried About Running Out of  Food in the Last Year: Never true    Ran Out of Food in the Last Year: Never true  Transportation Needs: No Transportation Needs (11/06/2023)   Received from [facility]   PRAPARE - Transportation    Lack of Transportation (Medical): No    Lack of Transportation (Non-Medical): No  Physical Activity: Insufficiently Active (11/06/2023)   Received from [facility]   Exercise Vital Sign    Days of Exercise per Week: 2 days    Minutes of Exercise per Session: 10 min  Stress: No Stress Concern Present (11/06/2023)   Received from [facility] - Occupational Stress Questionnaire    Feeling of Stress : Not at all  Social Connections: Socially Isolated (  11/06/2023)   Received from [facility]   Social Connection and Isolation Panel [NHANES]    Frequency of Communication with Friends and Family: More than three times a week    Frequency of Social Gatherings with Friends and Family: More than three times a week    Attends Religious Services: Never    Database administrator or Organizations: No    Attends Banker Meetings: Never    Marital Status: Widowed  Intimate Partner Violence: Not At Risk (11/06/2023)   Received from [facility]   Humiliation, Afraid, Rape, and Kick questionnaire    Fear of Current or Ex-Partner: No    Emotionally Abused: No    Physically Abused: No    Sexually Abused: No    Family History: Family History  Problem Relation Age of Onset   Hypertension Mother    Lung cancer Mother    Hypertension Father    Diabetes Father    Hypertension Sister    Hypertension Sister    Lung cancer Brother    Colon cancer Neg Hx    Breast cancer Neg Hx     Current Medications:  Current Outpatient Medications:    acetaminophen (TYLENOL 8 HOUR ARTHRITIS PAIN) 650 MG CR tablet, Take 650 mg by mouth every 8 (eight) hours as needed for pain., Disp: , Rfl:    albuterol (PROVENTIL HFA;VENTOLIN HFA) 108 (90  Base) MCG/ACT inhaler, Inhale 2 puffs into the lungs every 6 (six) hours as needed for wheezing or shortness of breath., Disp: 1 Inhaler, Rfl: 6   albuterol (PROVENTIL) (2.5 MG/3ML) 0.083% nebulizer solution, Take 3 mLs (2.5 mg total) by nebulization every 6 (six) hours as needed for wheezing or shortness of breath., Disp: 360 mL, Rfl: 5   ALPRAZolam (XANAX) 0.25 MG tablet, Take 0.25 mg by mouth daily., Disp: , Rfl:    aspirin EC 81 MG tablet, Take 1 tablet by mouth daily., Disp: , Rfl:    buPROPion (WELLBUTRIN XL) 300 MG 24 hr tablet, Take 300 mg by mouth daily., Disp: , Rfl:    cloNIDine (CATAPRES) 0.1 MG tablet, Take 1 tablet by mouth twice daily, Disp: 60 tablet, Rfl: 0   Cyanocobalamin 5000 MCG TBDP, Dissolve in the mouth., Disp: , Rfl:    dextromethorphan-guaiFENesin (MUCINEX DM) 30-600 MG 12hr tablet, Take 1 tablet by mouth 2 (two) times daily., Disp: , Rfl:    diphenhydrAMINE (BENADRYL) 25 mg capsule, Take 25 mg by mouth every 6 (six) hours as needed., Disp: , Rfl:    docusate sodium (COLACE) 100 MG capsule, Take 100 mg by mouth in the morning., Disp: , Rfl:    famotidine (PEPCID) 20 MG tablet, One after supper, Disp: 30 tablet, Rfl: 11   hydrochlorothiazide (HYDRODIURIL) 12.5 MG tablet, Take 12.5 mg by mouth daily as needed (if blood pressure is greater than 128/82)., Disp: , Rfl: 0   levocetirizine (XYZAL) 5 MG tablet, Take 5 mg by mouth every evening., Disp: , Rfl:    losartan (COZAAR) 50 MG tablet, Take 50 mg by mouth daily as needed (if blood pressure is greater than 128/82)., Disp: , Rfl: 0   Melatonin 10 MG TABS, Take 10 mg by mouth at bedtime., Disp: , Rfl:    Multiple Vitamin (MULTIVITAMIN) tablet, Take 1 tablet by mouth daily., Disp: , Rfl:    naproxen sodium (ALEVE) 220 MG tablet, Take 220-440 mg by mouth 2 (two) times daily as needed (pain.)., Disp: , Rfl:    nicotine polacrilex (  NICORETTE) 4 MG lozenge, Take 4 mg by mouth as needed for smoking cessation., Disp: , Rfl:     RABEprazole (ACIPHEX) 20 MG tablet, Take 30-60 min before first meal of the day, Disp: 30 tablet, Rfl: 2   rosuvastatin (CRESTOR) 10 MG tablet, Take 10 mg by mouth at bedtime., Disp: , Rfl:    traMADol (ULTRAM) 50 MG tablet, Take 50 mg by mouth every 6 (six) hours as needed., Disp: , Rfl:    VEOZAH 45 MG TABS, Take 1 tablet by mouth daily., Disp: , Rfl:    Vitamin D, Ergocalciferol, (DRISDOL) 1.25 MG (50000 UNIT) CAPS capsule, Take by mouth., Disp: , Rfl:    metoprolol tartrate (LOPRESSOR) 25 MG tablet, Take 0.5 tablets (12.5 mg total) by mouth 2 (two) times daily., Disp: 90 tablet, Rfl: 3   Allergies: Allergies  Allergen Reactions   Clindamycin/Lincomycin Rash   Codeine Anaphylaxis   Lincomycin Rash    Also Clindamycin as it has lincomycin in it Also Clindamycin as it has lincomycin in it   Other Rash   Penicillins Anaphylaxis    Has patient had a PCN reaction causing immediate rash, facial/tongue/throat swelling, SOB or lightheadedness with hypotension: Unknown Has patient had a PCN reaction causing severe rash involving mucus membranes or skin necrosis: Unknown Has patient had a PCN reaction that required hospitalization: No Has patient had a PCN reaction occurring within the last 10 years: No If all of the above answers are "NO", then may proceed with Cephalosporin use. Has patient had a PCN reaction causing immediate rash, facial/tongue/throat swelling, SOB or lightheadedness with hypotension: Unknown Has patient had a PCN reaction causing severe rash involving mucus membranes or skin necrosis: Unknown Has patient had a PCN reaction that required hospitalization: No Has patient had a PCN reaction occurring within the last 10 years: No If all of the above answers are "NO", then may proceed with Cephalosporin use.    Vancomycin Itching    Scalp itching   Sulfa Antibiotics Nausea Only   Hydrocodone Rash   Tizanidine     Other Reaction(s): Other (See Comments)  Dry mouth     REVIEW OF SYSTEMS:   Review of Systems  Constitutional:  Negative for chills, fatigue and fever.  HENT:   Negative for lump/mass, mouth sores, nosebleeds, sore throat and trouble swallowing.   Eyes:  Negative for eye problems.  Respiratory:  Positive for cough and shortness of breath.   Cardiovascular:  Negative for chest pain, leg swelling and palpitations.  Gastrointestinal:  Negative for abdominal pain, constipation, diarrhea, nausea and vomiting.  Genitourinary:  Negative for bladder incontinence, difficulty urinating, dysuria, frequency, hematuria and nocturia.   Musculoskeletal:  Negative for arthralgias, back pain, flank pain, myalgias and neck pain.  Skin:  Negative for itching and rash.  Neurological:  Positive for dizziness, headaches and numbness.  Hematological:  Does not bruise/bleed easily.  Psychiatric/Behavioral:  Negative for depression, sleep disturbance and suicidal ideas. The patient is not nervous/anxious.   All other systems reviewed and are negative.    VITALS:   Blood pressure 97/72, pulse 85, temperature (!) 96.8 F (36 C), temperature source Tympanic, resp. rate 20, weight 121 lb 4.1 oz (55 kg), last menstrual period 08/04/1985, SpO2 100%.  Wt Readings from Last 3 Encounters:  02/20/24 121 lb 4.1 oz (55 kg)  01/23/24 120 lb (54.4 kg)  01/02/24 121 lb (54.9 kg)    Body mass index is 23.68 kg/m.  Performance status (ECOG): 1 - Symptomatic but  completely ambulatory  PHYSICAL EXAM:   Physical Exam Vitals and nursing note reviewed. Exam conducted with a chaperone present.  Constitutional:      Appearance: Normal appearance.  Cardiovascular:     Rate and Rhythm: Normal rate and regular rhythm.     Pulses: Normal pulses.     Heart sounds: Normal heart sounds.  Pulmonary:     Effort: Pulmonary effort is normal.     Breath sounds: Normal breath sounds.  Abdominal:     Palpations: Abdomen is soft. There is no hepatomegaly, splenomegaly or mass.      Tenderness: There is no abdominal tenderness.  Musculoskeletal:     Right lower leg: No edema.     Left lower leg: No edema.  Lymphadenopathy:     Cervical: No cervical adenopathy.     Right cervical: No superficial, deep or posterior cervical adenopathy.    Left cervical: No superficial, deep or posterior cervical adenopathy.     Upper Body:     Right upper body: No supraclavicular or axillary adenopathy.     Left upper body: No supraclavicular or axillary adenopathy.  Neurological:     General: No focal deficit present.     Mental Status: She is alert and oriented to person, place, and time.  Psychiatric:        Mood and Affect: Mood normal.        Behavior: Behavior normal.     LABS:   CBC     Component Value Date/Time   WBC 9.0 02/10/2024 0856   RBC 3.31 (L) 02/10/2024 0856   HGB 11.4 (L) 02/10/2024 0856   HGB 11.7 01/23/2024 1023   HGB 12.9 09/02/2016 1403   HCT 33.2 (L) 02/10/2024 0856   HCT 34.8 01/23/2024 1023   HCT 38.4 09/02/2016 1403   PLT 358 02/10/2024 0856   PLT 401 01/23/2024 1023   MCV 100.3 (H) 02/10/2024 0856   MCV 102 (H) 01/23/2024 1023   MCV 94.6 09/02/2016 1403   MCH 34.4 (H) 02/10/2024 0856   MCHC 34.3 02/10/2024 0856   RDW 12.8 02/10/2024 0856   RDW 12.3 01/23/2024 1023   RDW 13.6 09/02/2016 1403   LYMPHSABS 1.4 02/10/2024 0856   LYMPHSABS 1.2 01/23/2024 1023   LYMPHSABS 2.5 09/02/2016 1403   MONOABS 0.6 02/10/2024 0856   MONOABS 0.7 09/02/2016 1403   EOSABS 0.0 02/10/2024 0856   EOSABS 0.3 01/23/2024 1023   BASOSABS 0.0 02/10/2024 0856   BASOSABS 0.1 01/23/2024 1023   BASOSABS 0.1 09/02/2016 1403    CMP      Component Value Date/Time   NA 140 02/10/2024 0856   NA 140 09/02/2016 1403   K 3.8 02/10/2024 0856   K 4.2 09/02/2016 1403   CL 102 02/10/2024 0856   CO2 26 02/10/2024 0856   CO2 27 09/02/2016 1403   GLUCOSE 109 (H) 02/10/2024 0856   GLUCOSE 92 09/02/2016 1403   BUN 19 02/10/2024 0856   BUN 15.6 09/02/2016 1403    CREATININE 1.20 (H) 02/10/2024 0917   CREATININE 0.8 09/02/2016 1403   CALCIUM 9.7 02/10/2024 0856   CALCIUM 9.6 09/02/2016 1403   PROT 7.6 02/10/2024 0856   PROT 8.0 09/02/2016 1403   ALBUMIN 4.1 02/10/2024 0856   ALBUMIN 3.6 09/02/2016 1403   AST 14 (L) 02/10/2024 0856   AST 12 09/02/2016 1403   ALT 13 02/10/2024 0856   ALT <9 09/02/2016 1403   ALKPHOS 95 02/10/2024 0856   ALKPHOS 159 (H) 09/02/2016 1403  BILITOT 0.5 02/10/2024 0856   BILITOT <0.30 09/02/2016 1403   GFRNONAA 51 (L) 02/10/2024 0856   GFRAA >60 06/09/2020 1259     No results found for: "CEA1", "CEA" / No results found for: "CEA1", "CEA" No results found for: "PSA1" No results found for: "WUJ811" No results found for: "CAN125"  No results found for: "TOTALPROTELP", "ALBUMINELP", "A1GS", "A2GS", "BETS", "BETA2SER", "GAMS", "MSPIKE", "SPEI" Lab Results  Component Value Date   TIBC 319 09/24/2021   TIBC 314 06/17/2021   TIBC 311 03/13/2021   FERRITIN 59 09/24/2021   FERRITIN 65 06/17/2021   FERRITIN 68 03/13/2021   IRONPCTSAT 26 09/24/2021   IRONPCTSAT 33 (H) 06/17/2021   IRONPCTSAT 27 03/13/2021   Lab Results  Component Value Date   LDH 167 09/24/2021   LDH 135 03/13/2021   LDH 162 12/08/2020     STUDIES:   CT Chest W Contrast Result Date: 02/17/2024 CLINICAL DATA:  Follow-up non-small-cell lung carcinoma. * Tracking Code: BO * EXAM: CT CHEST WITH CONTRAST TECHNIQUE: Multidetector CT imaging of the chest was performed during intravenous contrast administration. RADIATION DOSE REDUCTION: This exam was performed according to the departmental dose-optimization program which includes automated exposure control, adjustment of the mA and/or kV according to patient size and/or use of iterative reconstruction technique. CONTRAST:  75mL OMNIPAQUE IOHEXOL 300 MG/ML  SOLN COMPARISON:  PET-CT on 08/04/2023 FINDINGS: Cardiovascular:  No acute findings. Mediastinum/Nodes: Mild subcarinal lymphadenopathy is seen in  the mediastinum measuring 1.4 cm on image 51/2, without significant change. No new or increased areas of lymphadenopathy identified. Lungs/Pleura: Posttreatment changes in the right paramediastinal lung zone with bulky calcification in the right suprahilar region show no significant change. Two adjacent solid nodules in the anterior left upper lobe measuring 4 and 5 mm on images 37 and 38/series 3 remains stable. Stable 8 mm subpleural nodule in the lateral right lower lobe on image 100/3. No new or enlarging pulmonary nodules or masses identified. Stable central left upper lobe scarring and bronchiectasis. Upper Abdomen:  Unremarkable. Musculoskeletal:  No suspicious bone lesions. IMPRESSION: Stable posttreatment changes in the right paramediastinal lung zone. Stable mild subcarinal lymphadenopathy. Stable sub-centimeter bilateral pulmonary nodules, suggesting benign etiology as these did not show hypermetabolism on previous PET. No new or progressive disease identified. Electronically Signed   By: Danae Orleans M.D.   On: 02/17/2024 11:41   ECHOCARDIOGRAM COMPLETE Result Date: 02/03/2024    ECHOCARDIOGRAM REPORT   Patient Name:   Emily Pope Date of Exam: 02/03/2024 Medical Rec #:  [id number]        Height:       60.0 in Accession #:    [id number]       Weight:       120.0 lb Date of Birth:  12/25/1956        BSA:          1.502 m Patient Age:    67 years         BP:           118/78 mmHg Patient Gender: F                HR:           81 bpm. [facility] Procedure: 2D Echo, Cardiac Doppler, Color Doppler and Intracardiac            Opacification Agent Indications:    R07.9* Chest pain, unspecified  History:        Patient has  prior history of Echocardiogram examinations, most                 recent 03/15/2018. COPD, Signs/Symptoms:Dyspnea; Risk                 Factors:Hypertension and Former Smoker.  Sonographer:    Dominica Severin RCS, RVS Referring Phys: [id number] VISHNU P MALLIPEDDI  Sonographer  Comments: Technically difficult study due to poor echo windows. Technically difficult even with Definity. IMPRESSIONS  1. Left ventricular ejection fraction, by estimation, is 60 to 65%. The left ventricle has normal function. The left ventricle has no regional wall motion abnormalities. Left ventricular diastolic parameters were normal.  2. Right ventricular systolic function is normal. The right ventricular size is normal. There is mildly elevated pulmonary artery systolic pressure. The estimated right ventricular systolic pressure is 39.0 mmHg.  3. The mitral valve is grossly normal. Mild to moderate mitral valve regurgitation.  4. The aortic valve is tricuspid. Aortic valve regurgitation is not visualized. Aortic valve mean gradient measures 4.0 mmHg.  5. The inferior vena cava is normal in size with greater than 50% respiratory variability, suggesting right atrial pressure of 3 mmHg. Comparison(s): Prior images reviewed side by side. LVEF normal range at 60-65%. FINDINGS  Left Ventricle: Left ventricular ejection fraction, by estimation, is 60 to 65%. The left ventricle has normal function. The left ventricle has no regional wall motion abnormalities. Definity contrast agent was given IV to delineate the left ventricular  endocardial borders. The left ventricular internal cavity size was normal in size. There is borderline left ventricular hypertrophy. Left ventricular diastolic parameters were normal. Right Ventricle: The right ventricular size is normal. No increase in right ventricular wall thickness. Right ventricular systolic function is normal. There is mildly elevated pulmonary artery systolic pressure. The tricuspid regurgitant velocity is 3.00  m/s, and with an assumed right atrial pressure of 3 mmHg, the estimated right ventricular systolic pressure is 39.0 mmHg. Left Atrium: Left atrial size was normal in size. Right Atrium: Right atrial size was normal in size. Pericardium: There is no evidence of  pericardial effusion. Presence of epicardial fat layer. Mitral Valve: The mitral valve is grossly normal. Mild to moderate mitral valve regurgitation. MV peak gradient, 3.8 mmHg. The mean mitral valve gradient is 2.0 mmHg. Tricuspid Valve: The tricuspid valve is grossly normal. Tricuspid valve regurgitation is mild. Aortic Valve: The aortic valve is tricuspid. There is mild aortic valve annular calcification. Aortic valve regurgitation is not visualized. Aortic valve mean gradient measures 4.0 mmHg. Aortic valve peak gradient measures 6.5 mmHg. Aortic valve area, by  VTI measures 2.23 cm. Pulmonic Valve: The pulmonic valve was grossly normal. Pulmonic valve regurgitation is trivial. Aorta: The aortic root is normal in size and structure. Venous: The inferior vena cava is normal in size with greater than 50% respiratory variability, suggesting right atrial pressure of 3 mmHg. IAS/Shunts: No atrial level shunt detected by color flow Doppler.  LEFT VENTRICLE PLAX 2D LVIDd:         3.20 cm   Diastology LVIDs:         2.00 cm   LV e' medial:    9.36 cm/s LV PW:         1.00 cm   LV E/e' medial:  9.9 LV IVS:        1.00 cm   LV e' lateral:   9.68 cm/s LVOT diam:     1.90 cm   LV E/e' lateral: 9.5 LV SV:  54 LV SV Index:   36 LVOT Area:     2.84 cm  RIGHT VENTRICLE RV S prime:     13.40 cm/s TAPSE (M-mode): 1.9 cm LEFT ATRIUM             Index        RIGHT ATRIUM           Index LA Vol (A2C):   23.7 ml 15.78 ml/m  RA Area:     10.90 cm LA Vol (A4C):   21.4 ml 14.24 ml/m  RA Volume:   25.00 ml  16.64 ml/m LA Biplane Vol: 22.5 ml 14.98 ml/m  AORTIC VALVE                    PULMONIC VALVE AV Area (Vmax):    2.09 cm     PV Vmax:       0.88 m/s AV Area (Vmean):   2.08 cm     PV Peak grad:  3.1 mmHg AV Area (VTI):     2.23 cm AV Vmax:           127.00 cm/s AV Vmean:          91.900 cm/s AV VTI:            0.242 m AV Peak Grad:      6.5 mmHg AV Mean Grad:      4.0 mmHg LVOT Vmax:         93.80 cm/s LVOT Vmean:         67.400 cm/s LVOT VTI:          0.190 m LVOT/AV VTI ratio: 0.79  AORTA Ao Root diam: 3.00 cm MITRAL VALVE               TRICUSPID VALVE MV Area (PHT): 3.79 cm    TR Peak grad:   36.0 mmHg MV Area VTI:   2.35 cm    TR Vmax:        300.00 cm/s MV Peak grad:  3.8 mmHg MV Mean grad:  2.0 mmHg    SHUNTS MV Vmax:       0.97 m/s    Systemic VTI:  0.19 m MV Vmean:      70.7 cm/s   Systemic Diam: 1.90 cm MV Decel Time: 200 msec MV E velocity: 92.20 cm/s MV A velocity: 83.90 cm/s MV E/A ratio:  1.10 Nona Dell MD Electronically signed by Nona Dell MD Signature Date/Time: 02/03/2024/4:40:59 PM    Final

## 2024-02-21 DIAGNOSIS — Z09 Encounter for follow-up examination after completed treatment for conditions other than malignant neoplasm: Secondary | ICD-10-CM | POA: Diagnosis not present

## 2024-02-22 DIAGNOSIS — D692 Other nonthrombocytopenic purpura: Secondary | ICD-10-CM | POA: Diagnosis not present

## 2024-02-22 DIAGNOSIS — I10 Essential (primary) hypertension: Secondary | ICD-10-CM | POA: Diagnosis not present

## 2024-02-22 DIAGNOSIS — J449 Chronic obstructive pulmonary disease, unspecified: Secondary | ICD-10-CM | POA: Diagnosis not present

## 2024-02-22 DIAGNOSIS — Z299 Encounter for prophylactic measures, unspecified: Secondary | ICD-10-CM | POA: Diagnosis not present

## 2024-02-22 DIAGNOSIS — I700 Atherosclerosis of aorta: Secondary | ICD-10-CM | POA: Diagnosis not present

## 2024-02-29 ENCOUNTER — Encounter: Payer: Self-pay | Admitting: *Deleted

## 2024-02-29 ENCOUNTER — Other Ambulatory Visit: Payer: Self-pay | Admitting: Internal Medicine

## 2024-02-29 NOTE — Telephone Encounter (Signed)
 Dr. [name], I sent the patient a message on mychart that a refill was sent in on 02/03/2024 for 60 tablets and that at her last visit she was to follow up with her PCP, Dr. Sherril Croon.  I advised her to call her PCP office to schedule and appointment for follow up so her PCP can prescribe the clonidine going forward.  This is just an [place].

## 2024-03-01 ENCOUNTER — Ambulatory Visit (INDEPENDENT_AMBULATORY_CARE_PROVIDER_SITE_OTHER): Payer: 59 | Admitting: Otolaryngology

## 2024-03-01 ENCOUNTER — Encounter (INDEPENDENT_AMBULATORY_CARE_PROVIDER_SITE_OTHER): Payer: Self-pay | Admitting: Otolaryngology

## 2024-03-01 VITALS — BP 130/79 | HR 89 | Ht 60.0 in | Wt 120.0 lb

## 2024-03-01 DIAGNOSIS — K219 Gastro-esophageal reflux disease without esophagitis: Secondary | ICD-10-CM

## 2024-03-01 DIAGNOSIS — J3089 Other allergic rhinitis: Secondary | ICD-10-CM

## 2024-03-01 DIAGNOSIS — R0981 Nasal congestion: Secondary | ICD-10-CM

## 2024-03-01 DIAGNOSIS — R0982 Postnasal drip: Secondary | ICD-10-CM | POA: Diagnosis not present

## 2024-03-01 DIAGNOSIS — R053 Chronic cough: Secondary | ICD-10-CM

## 2024-03-01 DIAGNOSIS — Z85118 Personal history of other malignant neoplasm of bronchus and lung: Secondary | ICD-10-CM | POA: Diagnosis not present

## 2024-03-01 NOTE — Progress Notes (Signed)
 ENT Progress Note:   Update 03/01/2024  Discussed the use of AI scribe software for clinical note transcription with the patient, who gave verbal consent to proceed.  History of Present Illness   Emily Pope is a 68 year old female with lung cancer and COPD who presents for f/u of chronic cough.  She experiences shortness of breath and chronic cough, particularly with minimal exertion such as drying off after a shower or changing clothes. The sensation is described as 'trying to breathe through a straw,' although her oxygen levels do not drop significantly. There is a lot of mucus in her throat, attributed to post-nasal drainage, and her nose 'pours' at times. Her cough worsens with physical activity and improves with rest. Chloraseptic cough drops provide temporary relief.  A CT sinus scan was performed to rule out chronic sinus infection, which was negative with well-aerated paranasal sinuses bu evidence of septal deviation and ITH. She experiences nasal congestion. She has tried Flonase and Nasacort in the past without significant relief.  She recently completed a course of antibiotics and prednisone for a sore throat and bad cough, but these did not alleviate her symptoms. She uses albuterol and takes Xyzal at night, which she finds somewhat helpful. Afrin is used occasionally, but she has cut back significantly on its use. She is also on pantoprazole 40 mg BID for reflux, which is currently under control.  She has a history of lung cancer and radiation, diagnosed with COPD and emphysema. She has not smoked since September 2017.      Records Reviewed:  Pulmonary Office visit Dr [name] 01/23/24 Centrilobular emphysema   Dyspnea:  MMRC1 = can walk nl pace, flat grade, can't hurry or go uphills or steps s sob   Cough: sensation of chest congestion creamy x 2017 when finished RT Sleep: level bed /one pillow s resp wakes up dry and thirty  SABA use: once or twice daily  02: none   Rec Pantoprazole (protonix) 40 mg   Take  30-60 min before first meal of the day and Pepcid (famotidine)  20 mg after supper until return to office  Only use your albuterol as a rescue medication  Also  Ok to try albuterol 15 min before an activity (on alternating days)  that you know would usually make you short of breath   For cough/ congestion >   Mucinex dm  up to maximum of  1200 mg every 12 hours and use the flutter valve as much as you can   Prednisone 10 mg take  4 each am x 2 days,   2 each am x 2 days,  1 each am x 2 days and stop     Please schedule a follow up office visit in 6 weeks, call sooner if needed with all medications /inhalers/ solutions in hand    Initial Evaluation  Reason for Consult: chronic nasal congestion and chronic cough   HPI: Discussed the use of AI scribe software for clinical note transcription with the patient, who gave verbal consent to proceed.  History of Present Illness   The patient is a 30 yoF, with a history of lung cancer treated with chemotherapy and radiation, presents with chronic cough and sinus congestion. They report a significant amount of mucus in the throat, but note that their lungs are clear. The sinus issues began approximately a year ago, with symptoms including sinus headaches, stuffiness, and drainage. Recently, the patient has noticed a buildup in the nasal area, which  they describe as dry blood or mucus and it bleeds when it is cleaned. They have a recent history of a sinus infection treated with Cipro and prednisone.  The patient has a history of smoking, but quit in 2017. They report some residual scarring from pneumonia and calcium deposits from the cancer in her lungs on prior chest imaging. They also report shortness of breath and are currently being evaluated for a potential blocked artery. They express difficulty in getting enough air through the nose, particularly during panic attacks.  The patient has been using Afrin nasal  spray once or twice a day for nasal congestion, and Xyzal for allergies until a couple of months ago. They also report a productive cough with creamy yellow mucus. They have recently finished a course of antibiotics for a sinus infection, during which they noticed blood in the mucus when coughing and blowing their nose.  The patient also has a history of acid reflux, for which they take medication twice daily. They report improvement in their cough since starting the medication. They have not had any imaging of their sinuses before. They completed their cancer treatment in December 2017 and continue to have CT scans every six months. They are due for their next scan in February.     Records Reviewed:  Office visit with Dr [name] 11/29/24 Brief patient profile:  67  yowf  quit smoking 2017  self referred to pulmonary clinic in [place]  09/12/2023 by for copd/ GOLD 0 [facility] pt   PFT 02/22/23 >> FEV1 1.95 (96%), FEV1% 80, TLC 4.83 (108%), RV 2.54 (133%), DLCO 52% and  worse p SABA    History of Present Illness  09/12/2023  Pulmonary/ 1st office eval/ [name] / [place] [facility] off trelegy since 09/09/23     Chief Complaint  Patient presents with   Centrilobular emphysema   Dyspnea:  MMRC1 = can walk nl pace, flat grade, can't hurry or go uphills or steps s sob   Cough: sensation of chest congestion creamy x 2017 when finished RT Sleep: level bed /one pillow s resp wakes up dry and thirty  SABA use: once or twice daily  02: none  Rec Pantoprazole (protonix) 40 mg   Take  30-60 min before first meal of the day and Pepcid (famotidine)  20 mg after supper until return to office  Only use your albuterol as a rescue medication  Also  Ok to try albuterol 15 min before an activity (on alternating days)  that you know would usually make you short of breath   For cough/ congestion >   Mucinex dm  up to maximum of  1200 mg every 12 hours and use the flutter valve as much as you can   Prednisone 10 mg take  4  each am x 2 days,   2 each am x 2 days,  1 each am x 2 days and stop     Please schedule a follow up office visit in 6 weeks, call sooner if needed with all medications /inhalers/ solutions in hand     10/21/2023  f/u ov/[place] [facility] [place] [facility]/[place] re: GOLD 0 copd/ emphysema on CT / ? UACS  maint on prn saba -  did  bring meds      Chief Complaint  Patient presents with   COPD      Gold 0  Dyspnea:  improved off trelegy but gets chest tightness reproducibly with house work - has not tried saba prior to ex but helps  if sits down and then takes it  Cough: yellowish green worse x sev weeks  Sleeping: flat bed one pillow no   resp cc  SABA use: as above  02: none  Prednisone for neck did not help her chest tightness  Rec Also  Ok to try albuterol 15 min before an activity (on alternating days with inhaler, then the nebulizer)  that you know would usually make you short of breath  Zpak should clear mucus  Clonidine 0.1 mg twice daily until you see Dr Sherril Croon  (can't tol Beta blockers)    Please schedule a follow up visit in 3 months but call sooner if needed    Admit [facility] 11/19/23  Pancreatitis    11/30/2023  f/u ov/[place] office/[place] re: GOLD 0/ emphysema  maint on no rx      Chief Complaint  Patient presents with   COPD   Shortness of Breath  Dyspnea:  able to do [employer] on day of ov slow pace  Cough: mostly yellowish just finished levaquin  Sleeping: on side one neck pillow s   resp cc  SABA use: avg  2-3 x per week hfa/ neb not at all  02: none    No obvious day to day or daytime variability or assoc   hemoptysis or cp or chest tightness, subjective wheeze or overt  symptoms.     Also denies any obvious fluctuation of symptoms with weather or environmental changes or other aggravating or alleviating factors except as outlined above    No unusual exposure hx or h/o childhood pna/ asthma or knowledge of premature birth.   A/P  Onset ? 2023 / worse on trelegy 100 > d/c'd 09/09/23 -   max gerd rx 09/12/2023 >>> improved initially off trelegy until "caught cold"  -  referred to ENT 11/30/2023 >>>    Her breathing is borderline stridorous today but yet she denies any increase sob over baseline - she is minimally hoarse and has pseudowheeze on insp/ exp typical of a fixed upper airway obst with neg PET  08/04/23 so doubt tumor directly involved and just needs a quit look with laryngoscope > referred to ENT/ in meantime continue max gerd rx.            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    Past Surgical History:  Procedure Laterality Date   ABDOMINAL HYSTERECTOMY     APPENDECTOMY     when had hysterectomy   CARPAL TUNNEL RELEASE Bilateral    ENDOBRONCHIAL ULTRASOUND Bilateral 08/23/2016   Procedure: ENDOBRONCHIAL ULTRASOUND;  Surgeon: Leslye Peer, MD;  Location: [facility];  Service: Cardiopulmonary;  Laterality: Bilateral;   HERNIA REPAIR     left inguinal hernia age 69   PORT-A-CATH REMOVAL Left 11/02/2021   Procedure: MINOR REMOVAL PORT-A-CATH;  Surgeon: Franky Macho, MD;  Location: [facility];  Service: General;  Laterality: Left;  pt to arrive at 8:30   PORTACATH PLACEMENT Left 09/17/2016   Procedure: INSERTION PORT-A-CATH LEFT SUBCLAVIAN;  Surgeon: Franky Macho, MD;  Location: [facility];  Service: General;  Laterality: Left;   TUBAL LIGATION  Family History  Problem Relation Age of Onset   Hypertension Mother    Lung cancer Mother    Hypertension Father    Diabetes Father    Hypertension Sister    Hypertension Sister    Lung cancer Brother    Colon cancer Neg Hx    Breast cancer Neg Hx     Social History:  reports that she quit smoking about 7  years ago. Her smoking use included cigarettes. She started smoking about 50 years ago. She has a 86 pack-year smoking history. She has never used smokeless tobacco. She reports that she does not drink alcohol and does not use drugs.  Allergies:  Allergies  Allergen Reactions   Clindamycin/Lincomycin Rash   Codeine Anaphylaxis   Lincomycin Rash    Also Clindamycin as it has lincomycin in it Also Clindamycin as it has lincomycin in it   Other Rash   Penicillins Anaphylaxis    Has patient had a PCN reaction causing immediate rash, facial/tongue/throat swelling, SOB or lightheadedness with hypotension: Unknown Has patient had a PCN reaction causing severe rash involving mucus membranes or skin necrosis: Unknown Has patient had a PCN reaction that required hospitalization: No Has patient had a PCN reaction occurring within the last 10 years: No If all of the above answers are "NO", then may proceed with Cephalosporin use. Has patient had a PCN reaction causing immediate rash, facial/tongue/throat swelling, SOB or lightheadedness with hypotension: Unknown Has patient had a PCN reaction causing severe rash involving mucus membranes or skin necrosis: Unknown Has patient had a PCN reaction that required hospitalization: No Has patient had a PCN reaction occurring within the last 10 years: No If all of the above answers are "NO", then may proceed with Cephalosporin use.    Vancomycin Itching    Scalp itching   Sulfa Antibiotics Nausea Only   Hydrocodone Rash   Tizanidine     Other Reaction(s): Other (See Comments)  Dry mouth    Medications: I have reviewed the patient's current medications.  The PMH, PSH, Medications, Allergies, and SH were reviewed and updated.  ROS: Constitutional: Negative for fever, weight loss and weight gain. Cardiovascular: Negative for chest pain and dyspnea on exertion. Respiratory: Is not experiencing shortness of breath at rest. Gastrointestinal: Negative  for nausea and vomiting. Neurological: Negative for headaches. Psychiatric: The patient is not nervous/anxious  Blood pressure 130/79, pulse 89, height 5' (1.524 m), weight 120 lb (54.4 kg), last menstrual period 08/04/1985, SpO2 96%.  PHYSICAL EXAM:  Exam: General: Well-developed, well-nourished Communication and Voice: slightly raspy Respiratory Respiratory effort: Equal inspiration and expiration without stridor Cardiovascular Peripheral Vascular: Warm extremities with equal color/perfusion Eyes: No nystagmus with equal extraocular motion bilaterally Neuro/Psych/Balance: Patient oriented to person, place, and time; Appropriate mood and affect; Gait is intact with no imbalance; Cranial nerves I-XII are intact Head and Face Inspection: Normocephalic and atraumatic without mass or lesion Palpation: Facial skeleton intact without bony stepoffs Salivary Glands: No mass or tenderness Facial Strength: Facial motility symmetric and full bilaterally ENT Pinna: External ear intact and fully developed External canal: Canal is patent with intact skin Tympanic Membrane: Clear and mobile External Nose: No scar or anatomic deformity Internal Nose: Septum is deviated to the left. No polyp, or purulence. Mucosal edema and erythema present.  Bilateral inferior turbinate hypertrophy.  Lips, Teeth, and gums: Mucosa and teeth intact and viable TMJ: No pain to palpation with full mobility Oral cavity/oropharynx: No erythema or exudate, no lesions present Neck Neck  and Trachea: Midline trachea without mass or lesion Thyroid: No mass or nodularity Lymphatics: No lymphadenopathy.  Studies Reviewed: CT chest 02/10/24 IMPRESSION: Stable posttreatment changes in the right paramediastinal lung zone.   Stable mild subcarinal lymphadenopathy.   Stable sub-centimeter bilateral pulmonary nodules, suggesting benign etiology as these did not show hypermetabolism on previous PET.   No new or progressive  disease identified.  01/11/24 CT max/face   IMPRESSION: 1. Small bilateral maxillary sinus mucous retention cysts. Otherwise clear paranasal sinuses with patent sinus drainage pathways. 2. Leftward nasal septal deviation      Assessment/Plan: Encounter Diagnoses  Name Primary?   Chronic cough Yes   Chronic nasal congestion    Post-nasal drip    Environmental and seasonal allergies    Chronic GERD    History of lung cancer      Assessment and Plan    Symptoms of Chronic Sinusitis and nasal congestion Chronic sinusitis with symptoms of sinus congestion, nasal crusting, and epistaxis for the past year, recently exacerbated, s/p a round of abx and steroid with some improvement. No prior sinus imaging. Recent treatment with ciprofloxacin and prednisone. Examination revealed septal deviation and dry nasal passages without pus or polyps. Discussed risks of continued oxymetazoline use, including rebound congestion. Benefits of saline rinses and topical mupirocin for symptom relief and healing and to help with crusting. Encouraged regular petrolatum application for moisture. - Order CT scan of sinuses to rule out chronic sinus inflammation - Discontinue oxymetazoline - Initiate saline rinses with Neilmed bottle - Apply Vaseline to nasal passages BID - Prescribe topical mupirocin if symptoms persist for more than a week or worsen  Chronic Productive Cough Chronic cough with mucus production, recently improved. Lung cancer treated with chemotherapy and radiation, with residual scarring and post-radiation changes. No current signs of infection or aspiration. Recent pneumonia treated with abx per report. Discussed potential causes including post-radiation changes/chronic lung disease related to hx of smoking, and GERD. Benefits of current reflux management and potential addition of Reflux Gourmet supplement. - Continue current reflux management regimen with Pantoprazole 40 mg BID - Consider  Reflux Gourmet supplement after meals - Follow-up after sinus CT scan  Gastroesophageal Reflux Disease (GERD) GERD managed with twice-daily acid reflux medication. Symptoms improved with current regimen. Discussed benefits of current medication and potential addition of Reflux Gourmet supplement for further symptom control. - Continue current reflux management regimen with Pantoprazole 40 mg BID - Consider Reflux Gourmet supplement after meals  Lung Cancer Right-sided lung cancer treated with chemotherapy and radiation, completed in December 2017. Ongoing surveillance with CT scans every six months. Next scan scheduled for February. - Continue surveillance with CT scans every six months - Follow-up with oncologist as scheduled  Follow-up - Schedule sinus CT scan - Follow-up appointment after sinus CT scan - Provide contact information for scheduling scan if not contacted within a week.      Assessment and Plan    Chronic Cough Chronic cough likely multifactorial and is secondary to lung disease, post-nasal drip GERD LPR. Cough is productive for the most part, and this makes lung etiology more likely. She has hx XRT for lung cancer and hx of smoking/emphysema. Reports significant mucus in the throat, frequent coughing, and nasal drainage. Previous treatments with antibiotics, prednisone, and various over-the-counter medications have been ineffective. Currently using chloraseptic cough drops for temporary relief. Discussed potential use of nasal spray now that sinusitis is ruled out with negative CT sinuses which I reviewed with her today. Flonase and  Nasacort were previously ineffective. Considered allergy testing and potential allergy shots, but deferred due to ongoing evaluation for shortness of breath and other health related issues. - Consider allergy testing and potential allergy shots if candidate in the future  - Continue Xyzal at night - Avoid regular use of Afrin (oxymetazoline) -  consider resuming Nasacort 2 puffs b/l nares BID   Emphysema/COPD and hx of XRT for lung cancer  Diagnosed with emphysema by pulmonary. Stopped smoking in September 2017. Experiences significant dyspnea with minimal exertion, such as drying off after a shower. Currently on albuterol inhaler. Advised to continue follow-up with pulmonary doctor and maintain current inhaler regimen. Undergoing w/up with Cardiology right now - Continue follow-up with pulmonary/cardiology PCP   Gastroesophageal Reflux Disease (GERD) GERD is well-controlled with pantoprazole 40 mg BID. Reports no longer experiencing the sensation of acid pushing into the throat. Discussed the option of adding an additional medication after meals and before bed if needed. - Continue pantoprazole 40 mg BID -  Reflux Gourmet after meals - diet and lifestyle changes to minimize GERD - Refer to [employer] blog for dietary and lifestyle modifications/reflux cook book  General Health Maintenance Advised to maintain regular follow-ups with primary care physician, pulmonary doctor, and oncology doctor to monitor overall health and manage conditions effectively. - Schedule follow-up with primary care physician in three weeks - Keep appointments with pulmonary and oncology doctors  Follow-up - Schedule follow-up with otolaryngologist in 6-12 months or as needed.       Ashok Croon, MD Otolaryngology [facility] Phone: [phone] Fax: [phone]    03/01/2024, 3:40 PM

## 2024-03-06 ENCOUNTER — Other Ambulatory Visit: Payer: Self-pay

## 2024-03-06 DIAGNOSIS — I340 Nonrheumatic mitral (valve) insufficiency: Secondary | ICD-10-CM

## 2024-03-13 DIAGNOSIS — R053 Chronic cough: Secondary | ICD-10-CM | POA: Diagnosis not present

## 2024-03-13 DIAGNOSIS — D692 Other nonthrombocytopenic purpura: Secondary | ICD-10-CM | POA: Diagnosis not present

## 2024-03-13 DIAGNOSIS — Z299 Encounter for prophylactic measures, unspecified: Secondary | ICD-10-CM | POA: Diagnosis not present

## 2024-03-13 DIAGNOSIS — I700 Atherosclerosis of aorta: Secondary | ICD-10-CM | POA: Diagnosis not present

## 2024-03-13 DIAGNOSIS — I10 Essential (primary) hypertension: Secondary | ICD-10-CM | POA: Diagnosis not present

## 2024-03-14 ENCOUNTER — Ambulatory Visit: Admitting: Internal Medicine

## 2024-03-18 NOTE — Progress Notes (Unsigned)
 Emily Pope, female    DOB: [birth year]    MRN: [id number]   Brief patient profile:  41 yowf  MM/quit smoking 2017  self referred to pulmonary clinic in [place]  09/12/2023 by for copd/ GOLD 0 Sood pt  PFT 02/22/23 >> FEV1 1.95 (96%), Ratio 0.80, TLC 4.83 (108%), RV 2.54 (133%), DLCO 52% and  worse p SABA   History of Present Illness  09/12/2023  Pulmonary/ 1st Pope eval/ Emily Pope / [name] [facility] off trelegy since 09/09/23  Chief Complaint  Patient presents with   Centrilobular emphysema   Dyspnea:  MMRC1 = can walk nl pace, flat grade, can't hurry or go uphills or steps s sob   Cough: sensation of chest congestion creamy x 2017 when finished RT Sleep: level bed /one pillow s resp wakes up dry and thirty  SABA use: once or twice daily  02: none  Rec Pantoprazole (protonix) 40 mg   Take  30-60 min before first meal of the day and Pepcid (famotidine)  20 mg after supper until return to Pope  Only use your albuterol as a rescue medication  Also  Ok to try albuterol 15 min before an activity (on alternating days)  that you know would usually make you short of breath   For cough/ congestion >   Mucinex dm  up to maximum of  1200 mg every 12 hours and use the flutter valve as much as you can   Prednisone 10 mg take  4 each am x 2 days,   2 each am x 2 days,  1 each am x 2 days and stop    Please schedule a follow up Pope visit in 6 weeks, call sooner if needed with all medications /inhalers/ solutions in hand     10/21/2023  f/u ov/Emily Pope/Emily Pope re: GOLD 0 copd/ emphysema on CT / ? UACS  maint on prn saba -  did  bring meds  Chief Complaint  Patient presents with   COPD    Gold 0  Dyspnea:  improved off trelegy but gets chest tightness reproducibly with house work - has not tried saba prior to ex but helps if sits down and then takes it  Cough: yellowish green worse x sev weeks  Sleeping: flat bed one pillow no   resp cc  SABA use: as above  02: none  Prednisone  for neck did not help her chest tightness  Rec Also  Ok to try albuterol 15 min before an activity (on alternating days with inhaler, then the nebulizer)  that you know would usually make you short of breath  Zpak should clear mucus  Clonidine 0.1 mg twice daily until you see Dr Sherril Croon  (can't tol Beta blockers)  Please schedule a follow up visit in 3 months but call sooner if needed    Admit [facility] 11/19/23  Pancreatitis    11/30/2023  f/u ov/[place] [facility]/Emily Pope re: GOLD 0/ emphysema  maint on no rx   Chief Complaint  Patient presents with   COPD   Shortness of Breath  Dyspnea:  able to do [employer] on day of ov slow pace  Cough: mostly yellowish just finished levaquin  Sleeping: on side one neck pillow s   resp cc  SABA use: avg  2-3 x per week hfa/ neb not at all  02: none  Rec My Pope will be contacting you by phone for referral to [facility]    Sinus CT 01/11/24  clear paranasal sinuses  with patent sinus drainage pathways. 2. Leftward nasal septal deviation.    01/23/2024  f/u ov/[place] Pope/Emily Pope re: GOLD 0 copd/ emphysema  maint on no rx   Chief Complaint  Patient presents with   Follow-up    3 month follow up   Dyspnea:  [employer] ok  Cough: lots of yellowish mucus in am mostly  Sleeping: on side flat bed one pillow   resp cc  SABA use: hfa and neb but not using either  02: none  Rec Try substituting  aciphex 20 mg  for pantorazole 40  (still  Take it 30-60 min before first meal of the day)    Allergy screen 01/23/2024 >  Eos 0.3 /  IgE  15  alpha one phenotype  MM / evel 146    03/21/2024  f/u ov/[place] Pope/Emily Pope re: cough x 2017 when took enfienzie for lung ca worse x 3-4 months maint on gerd rx   Chief Complaint  Patient presents with   Acute Visit    Coughing x3-4 months has follow up with pcp and ent given z-pak 3 weeks ago , she has taken mucinex to help with cough. She said that it worse at night  going to the morning .  Afrin nad xyzal  Dyspnea:   still able to walk at [employer] leaning on back  Cough: worse first thing in am thick / yellow/ green with multiple abx allergies/ intolerance    Sleeping: flat bed on side one pillow resp cc  SABA use: qid hfa 02: none     No obvious day to day or daytime variability or assoc  mucus plugs or hemoptysis or cp or chest tightness, subjective wheeze or overt sinus or hb symptoms.    Also denies any obvious fluctuation of symptoms with weather or environmental changes or other aggravating or alleviating factors except as outlined above   No unusual exposure hx or h/o childhood pna/ asthma or knowledge of premature birth.  Current Allergies, Complete Past Medical History, Past Surgical History, Family History, and Social History were reviewed in [employer] record.  ROS  The following are not active complaints unless bolded Hoarseness, sore throat, dysphagia, dental problems, itching, sneezing,  nasal congestion or discharge of excess mucus or purulent secretions, ear ache,   fever, chills, sweats, unintended wt loss or wt gain, classically pleuritic or exertional cp,  orthopnea pnd or arm/hand swelling  or leg swelling, presyncope, palpitations, abdominal pain, anorexia, nausea, vomiting, diarrhea  or change in bowel habits or change in bladder habits, change in stools or change in urine, dysuria, hematuria,  rash, arthralgias, visual complaints, headache, numbness, weakness or ataxia or problems with walking or coordination,  change in mood or  memory.        Current Meds  Medication Sig   acetaminophen (TYLENOL 8 HOUR ARTHRITIS PAIN) 650 MG CR tablet Take 650 mg by mouth every 8 (eight) hours as needed for pain.   albuterol (PROVENTIL HFA;VENTOLIN HFA) 108 (90 Base) MCG/ACT inhaler Inhale 2 puffs into the lungs every 6 (six) hours as needed for wheezing or shortness of breath.   albuterol (PROVENTIL) (2.5 MG/3ML) 0.083% nebulizer solution Take 3 mLs (2.5 mg total) by  nebulization every 6 (six) hours as needed for wheezing or shortness of breath.   ALPRAZolam (XANAX) 0.25 MG tablet Take 0.25 mg by mouth daily.   aspirin EC 81 MG tablet Take 1 tablet by mouth daily.        buPROPion (  WELLBUTRIN XL) 300 MG 24 hr tablet Take 300 mg by mouth daily.   cloNIDine (CATAPRES) 0.1 MG tablet Take 1 tablet by mouth twice daily   Cyanocobalamin 5000 MCG TBDP Dissolve in the mouth.   dextromethorphan-guaiFENesin (MUCINEX DM) 30-600 MG 12hr tablet Take 1 tablet by mouth 2 (two) times daily.   diphenhydrAMINE (BENADRYL) 25 mg capsule Take 25 mg by mouth every 6 (six) hours as needed.   docusate sodium (COLACE) 100 MG capsule Take 100 mg by mouth in the morning.   doxycycline (VIBRA-TABS) 100 MG tablet Take 1 tablet (100 mg total) by mouth 2 (two) times daily.   famotidine (PEPCID) 20 MG tablet One after supper   hydrochlorothiazide (HYDRODIURIL) 12.5 MG tablet Take 12.5 mg by mouth daily as needed (if blood pressure is greater than 128/82).   levocetirizine (XYZAL) 5 MG tablet Take 5 mg by mouth every evening.   losartan (COZAAR) 50 MG tablet Take 50 mg by mouth daily as needed (if blood pressure is greater than 128/82).   Melatonin 10 MG TABS Take 10 mg by mouth at bedtime.   Multiple Vitamin (MULTIVITAMIN) tablet Take 1 tablet by mouth daily.   naproxen sodium (ALEVE) 220 MG tablet Take 220-440 mg by mouth 2 (two) times daily as needed (pain.).   nicotine polacrilex (NICORETTE) 4 MG lozenge Take 4 mg by mouth as needed for smoking cessation.        RABEprazole (ACIPHEX) 20 MG tablet Take 30-60 min before first meal of the day   rosuvastatin (CRESTOR) 10 MG tablet Take 10 mg by mouth at bedtime.            VEOZAH 45 MG TABS Take 1 tablet by mouth daily.   Vitamin D, Ergocalciferol, (DRISDOL) 1.25 MG (50000 UNIT) CAPS capsule Take by mouth.            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      Objective:    Wts  03/21/2024        127  01/23/2024        120  11/30/2023       116   10/21/23 122 lb (55.3 kg)  09/12/23 122 lb (55.3 kg)  08/10/23 128 lb (58.1 kg)    Vital signs reviewed  03/21/2024  - Note at rest 02 sats  94% on RA   General appearance:    amb wf nad      HEENT : Oropharynx  clear/ no teeth      Nasal turbinates  mild non specific edema    NECK :  without  apparent JVD/ palpable Nodes/TM    LUNGS: no acc muscle use,  Nl contour chest which is clear to A and P bilaterally with  cough on insp   maneuvers   CV:  RRR  no s3 or murmur or increase in P2, and no edema   ABD:  soft and nontender   MS:  Gait nl   ext warm without deformities Or obvious joint restrictions  calf tenderness, cyanosis or clubbing    SKIN: warm and dry without lesions  NEURO:  alert, approp, nl sensorium with  no motor or cerebellar deficits apparent.             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 Increasing small left pleural  effusion with slightly worsened left basilar atelectasis. No  pneumothorax. No acute osseous abnormality.     Assessment

## 2024-03-21 ENCOUNTER — Encounter: Payer: Self-pay | Admitting: Internal Medicine

## 2024-03-21 ENCOUNTER — Ambulatory Visit: Admitting: Internal Medicine

## 2024-03-21 ENCOUNTER — Ambulatory Visit: Payer: Self-pay | Admitting: Internal Medicine

## 2024-03-21 VITALS — BP 116/75 | HR 80 | Ht 60.0 in | Wt 127.6 lb

## 2024-03-21 DIAGNOSIS — J449 Chronic obstructive pulmonary disease, unspecified: Secondary | ICD-10-CM

## 2024-03-21 DIAGNOSIS — R058 Other specified cough: Secondary | ICD-10-CM | POA: Diagnosis not present

## 2024-03-21 DIAGNOSIS — J310 Chronic rhinitis: Secondary | ICD-10-CM | POA: Diagnosis not present

## 2024-03-21 MED ORDER — BUDESONIDE 0.25 MG/2ML IN SUSP: 0.2500 mg | Freq: Two times a day (BID) | RESPIRATORY_TRACT | 12 refills | Status: DC

## 2024-03-21 MED ORDER — AZITHROMYCIN 250 MG PO TABS: 250.0000 mg | ORAL_TABLET | Freq: Every day | ORAL | 0 refills | Status: AC

## 2024-03-21 MED ORDER — TRAMADOL HCL 50 MG PO TABS: 50.0000 mg | ORAL_TABLET | ORAL | 0 refills | Status: AC | PRN

## 2024-03-21 MED ORDER — DOXYCYCLINE HYCLATE 100 MG PO TABS: 100.0000 mg | ORAL_TABLET | Freq: Two times a day (BID) | ORAL | 0 refills | Status: DC

## 2024-03-21 MED ORDER — PREDNISONE 10 MG PO TABS: ORAL_TABLET | ORAL | 0 refills | Status: DC

## 2024-03-21 NOTE — Assessment & Plan Note (Signed)
 Onset ? 2023 / worse on trelegy 100 > d/c'd 09/09/23 -  max gerd rx 09/12/2023 >>> improved initially off trelegy until "caught cold"  -  referred to ENT 11/30/2023 >>> seen  01/02/24 c/w chronic sinusitis > CT sinus 01/11/24 1. Small bilateral maxillary sinus mucous retention cysts. Otherwise clear paranasal sinuses with patent sinus drainage pathways. 2. Leftward nasal septal deviation.  Upper airway cough syndrome (previously labeled PNDS),  is so named because it's frequently impossible to sort out how much is  CR/sinusitis with freq throat clearing (which can be related to primary GERD)   vs  causing  secondary (" extra esophageal")  GERD from wide swings in gastric pressure that occur with throat clearing, often  promoting self use of mint and menthol lozenges that reduce the lower esophageal sphincter tone and exacerbate the problem further in a cyclical fashion.   These are the same pts (now being labeled as having "irritable larynx syndrome" by some cough centers) who not infrequently have a history of having failed to tolerate ace inhibitors,  dry powder inhalers or biphosphonates or report having atypical/extraesophageal reflux symptoms(eg LPR)  that don't respond to standard doses of PPI  and are easily confused as having aecopd or asthma flares by even experienced allergists/ pulmonologists (myself included).  Of the three most common causes of  Sub-acute / recurrent or chronic cough, only one (GERD)  can actually contribute to/ trigger  the other two (asthma and post nasal drip syndrome)  and perpetuate the cylce of cough.  While not intuitively obvious, many patients with chronic low grade reflux do not cough until there is a primary insult that disturbs the protective epithelial barrier and exposes sensitive nerve endings.   This is typically viral but can due to PNDS and  either may apply here.   The point is that once this occurs, it is difficult to eliminate the cycle  using anything but a  maximally effective acid suppression regimen at least in the short run, accompanied by an appropriate diet to address non acid GERD and control / eliminate the cough itself for at least 3 days with tramadol and >>> also added depomedrol 120 mg IM in case of component of Th-2 driven upper or lower airways inflammation (if cough responds short term only to relapse before return while will on full rx for uacs (as above), then that would point to allergic rhinitis/ asthma or eos bronchitis as alternative dx)

## 2024-03-21 NOTE — Assessment & Plan Note (Addendum)
 Quit smoking 2017/ MM for alpha one  with dx of lung ca - PFT 02/22/23 >> FEV1 1.95 (96%), FEV1% 80, TLC 4.83 (108%), RV 2.54 (133%), DLCO 52%  worse p SABA and no curvature on inp/exp to indicate true airflow obstruction  - 09/12/2023  After extensive coaching inhaler device,  effectiveness =    80% with hfa so just rx as AB with albuterol more appropriately  09/12/2023   Walked on RA  x  3  lap(s) =  approx 450  ft  @ mod pace, stopped due to end of study with lowest 02 sats 96%  p saba prior  - Allergy screen 01/23/2024 >  Eos 0.3 /  IgE  15  alpha one phenotype  MM / level 146  >>>  03/21/2024 change rx to alb/bud 0.25 mg bid as main cc = cough either due to AB or UACS

## 2024-03-21 NOTE — Telephone Encounter (Signed)
Dr. Wert, please advise. 

## 2024-03-21 NOTE — Patient Instructions (Signed)
 Doxycylcine 100 mg twice daily before eat with a large glass of water   Nebulizer albuterol 2.5 mg four times daily   Twice daily add budesonide 0.25 mg to the albuterol   Mucinex dm 1200 mg every 12 hours  and take enough tramadol  50mg   to eliminate the cough  = up to 2 every 4 hours x  5days then try to taper to as needed    nasal steroids (like flonase) have no immediate benefit in terms of improving symptoms.  To help them reached the target tissue, the patient should use Afrin two puffs every 12 hours applied one min before using the nasal steroids.  Afrin should be stopped after no more than 5 days.  If the symptoms worsen, Afrin can be restarted after 5 days off of therapy to prevent rebound congestion from overuse of Afrin.  I also emphasized that in no way are nasal steroids a concern in terms of "addiction".   Prednisone 10 mg take  4 each am x 2 days,   2 each am x 2 days,  1 each am x 2 days and stop    Please schedule a follow up office visit in 2 weeks, call sooner if needed with all medications /inhalers/ solutions in hand so we can verify exactly what you are taking. This includes all medications from all doctors and over the counters - PLEASE separate them into two bags:  the ones you take automatically, no matter what, vs the ones you take just when you feel you need them "BAG #2 is UP TO YOU"  - this will really help [place] help you take your medications more effectively.

## 2024-03-21 NOTE — Telephone Encounter (Signed)
  Chief Complaint: medication question Additional Notes: Needing rx changed. See below. Pt currently at [facility] pharmacy. Called [facility] and spoke to [place]. Will forward message to in-house RN.   Copied from CRM [id number]. Topic: Clinical - Prescription Issue >> Mar 21, 2024 10:39 AM Isabell A wrote: Reason for CRM: Diane from [facility] calling in regard to doxycycline (VIBRA-TABS) 100 MG tablet prescription , states they have her listed as allergic to tetracyclines. Patient is currently at pharmacy. Reason for Disposition  [1] Caller has URGENT medicine question about med that PCP or specialist prescribed AND [2] triager unable to answer question  Answer Assessment - Initial Assessment Questions 1. NAME of MEDICINE: "What medicine(s) are you calling about?"     Doxycycline Speaking with [facility] pharmacy rep Diane - reports pt is currently at pharmacy 2. QUESTION: "What is your question?" (e.g., double dose of medicine, side effect)     [facility] reports she is allergic to Tetracycline 3. PRESCRIBER: "Who prescribed the medicine?" Reason: if prescribed by specialist, call should be referred to that group.     Dr. [name] 4. SYMPTOMS: "Do you have any symptoms?" If Yes, ask: "What symptoms are you having?"  "How bad are the symptoms (e.g., mild, moderate, severe)     Per rep, pt reports "raging yeast infection" with taking med  Protocols used: Medication Question Call-A-AH

## 2024-03-21 NOTE — Assessment & Plan Note (Signed)
 03/21/2024 started afrin/ flonase regimen p neg ent eval  03/01/24   She wants to continue afrin which should only be 5 days and only when nasal steroids used consistently and afrin used short term to help the steroids reach the intended tissue.   see avs for instructions unique to this ov          Each maintenance medication was reviewed in detail including emphasizing most importantly the difference between maintenance and prns and under what circumstances the prns are to be triggered using an action plan format where appropriate.  Total time for H and P, chart review, counseling, reviewing nebs/ nasal device(s) and generating customized AVS unique to this office visit / same day charting =  33  min for multiple  refractory respiratory  symptoms of uncertain etiology

## 2024-03-26 ENCOUNTER — Other Ambulatory Visit: Payer: Self-pay | Admitting: Internal Medicine

## 2024-03-26 NOTE — Telephone Encounter (Signed)
 Dr [name], please advise if you want to keep refilling clonidine for her

## 2024-03-26 NOTE — Telephone Encounter (Signed)
 Yes though will discuss at next ov

## 2024-04-05 ENCOUNTER — Ambulatory Visit: Admitting: Internal Medicine

## 2024-04-05 ENCOUNTER — Encounter: Payer: Self-pay | Admitting: Internal Medicine

## 2024-04-05 VITALS — BP 119/74 | HR 84 | Ht 60.0 in | Wt 129.0 lb

## 2024-04-05 DIAGNOSIS — J449 Chronic obstructive pulmonary disease, unspecified: Secondary | ICD-10-CM | POA: Diagnosis not present

## 2024-04-05 DIAGNOSIS — R058 Other specified cough: Secondary | ICD-10-CM

## 2024-04-05 MED ORDER — ALBUTEROL SULFATE (2.5 MG/3ML) 0.083% IN NEBU: 2.5000 mg | INHALATION_SOLUTION | RESPIRATORY_TRACT | 12 refills | Status: AC | PRN

## 2024-04-05 MED ORDER — TRAMADOL HCL 50 MG PO TABS: 50.0000 mg | ORAL_TABLET | ORAL | 0 refills | Status: AC | PRN

## 2024-04-05 MED ORDER — PREDNISONE 10 MG PO TABS: ORAL_TABLET | ORAL | 0 refills | Status: DC

## 2024-04-05 NOTE — Progress Notes (Signed)
 Emily Pope, female    DOB: 09-Dec-1956    MRN: [id number]   Brief patient profile:  4 yowf  MM/quit smoking 2017 [place] pt self referred back to pulmonary clinic in [facility]  09/12/2023 by for copd/ GOLD 0   PFT 02/22/23 >> FEV1 1.95 (96%), Ratio 0.80, TLC 4.83 (108%), RV 2.54 (133%), DLCO 52% and  worse p SABA   History of Present Illness  09/12/2023  Pulmonary/ 1st office eval/ Emily Pope / [place] [facility] off trelegy since 09/09/23  Chief Complaint  Patient presents with   Centrilobular emphysema   Dyspnea:  MMRC1 = can walk nl pace, flat grade, can't hurry or go uphills or steps s sob   Cough: sensation of chest congestion creamy x 2017 when finished RT Sleep: level bed /one pillow s resp wakes up dry and thirty  SABA use: once or twice daily  02: none  Rec Pantoprazole (protonix) 40 mg   Take  30-60 min before first meal of the day and Pepcid (famotidine)  20 mg after supper until return to office  Only use your albuterol as a rescue medication  Also  Ok to try albuterol 15 min before an activity (on alternating days)  that you know would usually make you short of breath   For cough/ congestion >   Mucinex dm  up to maximum of  1200 mg every 12 hours and use the flutter valve as much as you can   Prednisone 10 mg take  4 each am x 2 days,   2 each am x 2 days,  1 each am x 2 days and stop    Please schedule a follow up office visit in 6 weeks, call sooner if needed with all medications /inhalers/ solutions in hand     10/21/2023  f/u ov/[place] office/[name] re: GOLD 0 copd/ emphysema on CT / ? UACS  maint on prn saba -  did  bring meds  Chief Complaint  Patient presents with   COPD    Gold 0  Dyspnea:  improved off trelegy but gets chest tightness reproducibly with house work - has not tried saba prior to ex but helps if sits down and then takes it  Cough: yellowish green worse x sev weeks  Sleeping: flat bed one pillow no   resp cc  SABA use: as above  02: none   Prednisone for neck did not help her chest tightness  Rec Also  Ok to try albuterol 15 min before an activity (on alternating days with inhaler, then the nebulizer)  that you know would usually make you short of breath  Zpak should clear mucus  Clonidine 0.1 mg twice daily until you see Dr Darien Eden  (can't tol Beta blockers)  Please schedule a follow up visit in 3 months but call sooner if needed    Admit [facility] 11/19/23  Pancreatitis    11/30/2023  f/u ov/ office/Emily Pope re: GOLD 0/ emphysema  maint on no rx   Chief Complaint  Patient presents with   COPD   Shortness of Breath  Dyspnea:  able to do [employer] on day of ov slow pace  Cough: mostly yellowish just finished levaquin  Sleeping: on side one neck pillow s   resp cc  SABA use: avg  2-3 x per week hfa/ neb not at all  02: none  Rec My office will be contacting you by phone for referral to [facility]    Sinus CT 01/11/24  clear paranasal  sinuses with patent sinus drainage pathways. 2. Leftward nasal septal deviation.    01/23/2024  f/u ov/[place] office/Emily Pope re: GOLD 0 copd/ emphysema  maint on no rx   Chief Complaint  Patient presents with   Follow-up    3 month follow up   Dyspnea:  [employer] ok  Cough: lots of yellowish mucus in am mostly  Sleeping: on side flat bed one pillow   resp cc  SABA use: hfa and neb but not using either  02: none  Rec Try substituting  aciphex 20 mg  for pantorazole 40  (still  Take it 30-60 min before first meal of the day)    Allergy screen 01/23/2024 >  Eos 0.3 /  IgE  15  alpha one phenotype  MM / evel 146    03/21/2024  f/u ov/[place] office/Emily Pope re: cough x 2017 when took enfienzie for lung ca worse x 3-4 months maint on gerd rx   Chief Complaint  Patient presents with   Acute Visit    Coughing x3-4 months has follow up with pcp and ent given z-pak 3 weeks ago , she has taken mucinex to help with cough. She said that it worse at night  going to the morning .  Afrin and xyzal   Dyspnea:  still able to walk at [employer] leaning on cart due to  back  Cough: worse first thing in am thick / yellow/ green with multiple abx allergies/ intolerance    Sleeping: flat bed on side one pillow resp cc  SABA use: qid hfa 02: none  Rec Doxycylcine 100 mg twice daily before eat with a large glass of water  Nebulizer albuterol 2.5 mg four times daily  Twice daily add budesonide 0.25 mg to the albuterol  Mucinex dm 1200 mg every 12 hours  and take enough tramadol  50mg   to eliminate the cough  = up to 2 every 4 hours x  5days then try to taper to as needed  nasal steroids (like flonase) have no immediate benefit   Prednisone 10 mg take  4 each am x 2 days,   2 each am x 2 days,  1 each am x 2 days and stop   Please schedule a follow up office visit in 2 weeks, call sooner if needed with all medications /inhalers/ solutions in hand    04/05/2024  f/u ov/[place] office/Emily Pope re: cough x 2017  maint on alb/bud did  bring meds / no flutter  Chief Complaint  Patient presents with   Cough   Dyspnea:  slow walk [employer] shopping  Cough: minimally productive, very raspy assoc with watery rhinitis / xyzal helps  Sleeping: flat bed on side one pillow  resp cc  SABA use: using neb only as rec 02: none     No obvious day to day or daytime variability or assoc excess/ purulent sputum or mucus plugs or hemoptysis or cp or chest tightness, subjective wheeze or overt sinus or hb symptoms.    Also denies any obvious fluctuation of symptoms with weather or environmental changes or other aggravating or alleviating factors except as outlined above   No unusual exposure hx or h/o childhood pna/ asthma or knowledge of premature birth.  Current Allergies, Complete Past Medical History, Past Surgical History, Family History, and Social History were reviewed in [employer] record.  ROS  The following are not active complaints unless bolded Hoarseness, sore throat/globus,  dysphagia, dental problems, itching, sneezing,  nasal congestion  or discharge of excess mucus or purulent secretions, ear ache,   fever, chills, sweats, unintended wt loss or wt gain, classically pleuritic or exertional cp,  orthopnea pnd or arm/hand swelling  or leg swelling, presyncope, palpitations, abdominal pain, anorexia, nausea, vomiting, diarrhea  or change in bowel habits or change in bladder habits, change in stools or change in urine, dysuria, hematuria,  rash, arthralgias, visual complaints, headache, numbness, weakness or ataxia or problems with walking or coordination,  change in mood or  memory.        Current Meds  Medication Sig   acetaminophen (TYLENOL 8 HOUR ARTHRITIS PAIN) 650 MG CR tablet Take 650 mg by mouth every 8 (eight) hours as needed for pain.   albuterol (PROVENTIL) (2.5 MG/3ML) 0.083% nebulizer solution Take 3 mLs (2.5 mg total) by nebulization every 6 (six) hours as needed for wheezing or shortness of breath.   aspirin EC 81 MG tablet Take 1 tablet by mouth daily.   budesonide (PULMICORT) 0.25 MG/2ML nebulizer solution Take 2 mLs (0.25 mg total) by nebulization 2 (two) times daily.   buPROPion (WELLBUTRIN XL) 300 MG 24 hr tablet Take 300 mg by mouth daily.   cloNIDine (CATAPRES) 0.1 MG tablet Take 1 tablet by mouth twice daily   Cyanocobalamin 5000 MCG TBDP Dissolve in the mouth.   dextromethorphan-guaiFENesin (MUCINEX DM) 30-600 MG 12hr tablet Take 1 tablet by mouth 2 (two) times daily.   diphenhydrAMINE (BENADRYL) 25 mg capsule Take 25 mg by mouth every 6 (six) hours as needed.   famotidine (PEPCID) 20 MG tablet One after supper   hydrochlorothiazide (HYDRODIURIL) 12.5 MG tablet Take 12.5 mg by mouth daily as needed (if blood pressure is greater than 128/82).   levocetirizine (XYZAL) 5 MG tablet Take 5 mg by mouth every evening.   losartan (COZAAR) 50 MG tablet Take 50 mg by mouth daily as needed (if blood pressure is greater than 128/82).   Melatonin 10 MG TABS  Take 10 mg by mouth at bedtime.   Multiple Vitamin (MULTIVITAMIN) tablet Take 1 tablet by mouth daily.   naproxen sodium (ALEVE) 220 MG tablet Take 220-440 mg by mouth 2 (two) times daily as needed (pain.).   nicotine polacrilex (NICORETTE) 4 MG lozenge Take 4 mg by mouth as needed for smoking cessation.   RABEprazole (ACIPHEX) 20 MG tablet Take 30-60 min before first meal of the day   rosuvastatin (CRESTOR) 10 MG tablet Take 10 mg by mouth at bedtime.   traMADol (ULTRAM) 50 MG tablet Take 50 mg by mouth every 6 (six) hours as needed.   VEOZAH 45 MG TABS Take 1 tablet by mouth daily.   Vitamin D, Ergocalciferol, (DRISDOL) 1.25 MG (50000 UNIT) CAPS capsule Take by mouth.               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      Objective:    Wts  04/05/2024        129  03/21/2024        127  01/23/2024        120  11/30/2023  116   10/21/23 122 lb (55.3 kg)  09/12/23 122 lb (55.3 kg)  08/10/23 128 lb (58.1 kg)    Vital signs reviewed  04/05/2024  - Note at rest 02 sats  97% on RA   General appearance:    amb wf with variably  stridorous breathling/ mostly dry sounding cough    HEENT : Oropharynx  clear      Nasal turbinates nl    NECK :  without  apparent JVD/ palpable Nodes/TM    LUNGS: no acc muscle use,  Nl contour chest which is clear to A and P bilaterally without cough on insp or exp maneuvers/ mostly transmitted "wheeze" from upper airway    CV:  RRR  no s3 or murmur or increase in P2, and no edema   ABD:  soft and nontender   MS:  Gait nl   ext warm without deformities Or obvious joint restrictions  calf tenderness, cyanosis or clubbing    SKIN:  warm and dry without lesions    NEURO:  alert, approp, nl sensorium with  no motor or cerebellar deficits apparent.        I personally reviewed images and agree with radiology impression as follows:   Chest CTw contrast   02/10/24  Stable posttreatment changes in the right paramediastinal lung zone.   Stable mild subcarinal lymphadenopathy.   Stable sub-centimeter bilateral pulmonary nodules, suggesting benign etiology as these did not show hypermetabolism on previous PET.    >>>>  No new or progressive disease identified.              Assessment

## 2024-04-05 NOTE — Patient Instructions (Addendum)
 For cough/ congestion >  mucinex dm  up to maximum of  1200 mg every 12 hours and use the flutter valve as much as you can when coughing to prevent airway trauma    Add tramadol up to 2 every 4 hours until no longer coughing x 3 days then taper as tolerated   Prednisone 10 mg take  4 each am x 2 days,   2 each am x 2 days,  1 each am x 2 days and stop   Please schedule a follow up office visit in 6 weeks, call sooner if needed

## 2024-04-07 NOTE — Assessment & Plan Note (Signed)
 Onset ? 2023 / worse on trelegy 100 > d/c'd 09/09/23 -  max gerd rx 09/12/2023 >>> improved initially off trelegy until "caught cold"  -  referred to ENT 11/30/2023 >>> seen  01/02/24 c/w chronic sinusitis > CT sinus 01/11/24 1. Small bilateral maxillary sinus mucous retention cysts. Otherwise clear paranasal sinuses with patent sinus drainage pathways. 2. Leftward nasal septal deviation.  Did not follow instructions on how to eliminate cyclical coughing.  Reviewed: The standardized cough guidelines published in Chest by Slater Duncan in 2006 are still the best available and consist of a multiple step process (up to 12!) , not a single office visit,  and are intended  to address this problem logically,  with an alogrithm dependent on response to empiric treatment at  each progressive step  to determine a specific diagnosis with  minimal addtional testing needed. Therefore if adherence is an issue or can't be accurately verified,  it's very unlikely the standard evaluation and treatment will be successful here.    Furthermore, response to therapy (other than acute cough suppression, which should only be used short term with avoidance of narcotic containing cough syrups if possible), can be a gradual process for which the patient is not likely to  perceive immediate benefit.  Unlike going to an eye doctor where the best perscription is almost always the first one and is immediately effective, this is almost never the case in the management of chronic cough syndromes. Therefore the patient needs to commit up front to consistently adhere to recommendations  for up to 6 weeks of therapy directed at the likely underlying problem(s) before the response can be reasonably evaluated.   Rec Repeat the cyclical cough protocol which I reviewed from last AVS step by step.  Flutter valve training reviewed Advise to take recording of "wheezing" when it is at its worse for next ov to ENT  >>> also added 6 day taper off   Prednisone starting at 40 mg per day in case of component of Th-2 driven upper or lower airways inflammation (if cough responds short term only to relapse before return while will on full rx for uacs (as above), then that would point to allergic rhinitis/ asthma or eos bronchitis as alternative dx)            Each maintenance medication was reviewed in detail including emphasizing most importantly the difference between maintenance and prns and under what circumstances the prns are to be triggered using an action plan format where appropriate.  Total time for H and P, chart review, counseling, reviewing flutter/ neb device(s) and generating customized AVS unique to this office visit / same day charting = 44 min for chronic   refractory respiratory  symptoms of uncertain etiology

## 2024-04-09 ENCOUNTER — Other Ambulatory Visit: Payer: Self-pay | Admitting: Internal Medicine

## 2024-04-23 ENCOUNTER — Other Ambulatory Visit: Payer: Self-pay | Admitting: Internal Medicine

## 2024-04-25 DIAGNOSIS — I10 Essential (primary) hypertension: Secondary | ICD-10-CM | POA: Diagnosis not present

## 2024-04-25 DIAGNOSIS — R5383 Other fatigue: Secondary | ICD-10-CM | POA: Diagnosis not present

## 2024-04-25 DIAGNOSIS — Z0000 Encounter for general adult medical examination without abnormal findings: Secondary | ICD-10-CM | POA: Diagnosis not present

## 2024-04-25 DIAGNOSIS — E7800 Pure hypercholesterolemia, unspecified: Secondary | ICD-10-CM | POA: Diagnosis not present

## 2024-04-25 DIAGNOSIS — Z79899 Other long term (current) drug therapy: Secondary | ICD-10-CM | POA: Diagnosis not present

## 2024-04-25 DIAGNOSIS — E559 Vitamin D deficiency, unspecified: Secondary | ICD-10-CM | POA: Diagnosis not present

## 2024-04-25 DIAGNOSIS — Z7189 Other specified counseling: Secondary | ICD-10-CM | POA: Diagnosis not present

## 2024-04-25 DIAGNOSIS — Z299 Encounter for prophylactic measures, unspecified: Secondary | ICD-10-CM | POA: Diagnosis not present

## 2024-04-25 DIAGNOSIS — M8580 Other specified disorders of bone density and structure, unspecified site: Secondary | ICD-10-CM | POA: Diagnosis not present

## 2024-05-06 ENCOUNTER — Other Ambulatory Visit: Payer: Self-pay | Admitting: Internal Medicine

## 2024-05-10 DIAGNOSIS — Z299 Encounter for prophylactic measures, unspecified: Secondary | ICD-10-CM | POA: Diagnosis not present

## 2024-05-10 DIAGNOSIS — I10 Essential (primary) hypertension: Secondary | ICD-10-CM | POA: Diagnosis not present

## 2024-05-10 DIAGNOSIS — J449 Chronic obstructive pulmonary disease, unspecified: Secondary | ICD-10-CM | POA: Diagnosis not present

## 2024-05-13 NOTE — Progress Notes (Addendum)
 Emily Pope, female    DOB: May 30, 1956    MRN: [id number]   Brief patient profile:  88 yowf  MM/quit smoking 2017 [place] pt self referred back to pulmonary clinic in [facility]  09/12/2023 by for copd/ GOLD 0   PFT 02/22/23 >> FEV1 1.95 (96%), Ratio 0.80, TLC 4.83 (108%), RV 2.54 (133%), DLCO 52% and  worse p SABA  Lung ca > RT / chemo completed in Dec 2017 f/u by K    History of Present Illness  09/12/2023  Pulmonary/ 1st office eval/ Emily Pope / [place] [facility] off trelegy since 09/09/23  Chief Complaint  Patient presents with   Centrilobular emphysema   Dyspnea:  MMRC1 = can walk nl pace, flat grade, can't hurry or go uphills or steps s sob   Cough: sensation of chest congestion creamy x 2017 when finished RT Sleep: level bed /one pillow s resp wakes up dry and thirty  SABA use: once or twice daily  02: none  Rec Pantoprazole  (protonix ) 40 mg   Take  30-60 min before first meal of the day and Pepcid  (famotidine )  20 mg after supper until return to office  Only use your albuterol  as a rescue medication  Also  Ok to try albuterol  15 min before an activity (on alternating days)  that you know would usually make you short of breath   For cough/ congestion >   Mucinex dm  up to maximum of  1200 mg every 12 hours and use the flutter valve as much as you can   Prednisone  10 mg take  4 each am x 2 days,   2 each am x 2 days,  1 each am x 2 days and stop    Please schedule a follow up office visit in 6 weeks, call sooner if needed with all medications /inhalers/ solutions in hand     10/21/2023  f/u ov/[place] office/Emily Pope re: GOLD 0 copd/ emphysema on CT / ? UACS  maint on prn saba -  did  bring meds  Chief Complaint  Patient presents with   COPD    Gold 0  Dyspnea:  improved off trelegy but gets chest tightness reproducibly with house work - has not tried saba prior to ex but helps if sits down and then takes it  Cough: yellowish green worse x sev weeks  Sleeping: flat bed one  pillow no   resp cc  SABA use: as above  02: none  Prednisone  for neck did not help her chest tightness  Rec Also  Ok to try albuterol  15 min before an activity (on alternating days with inhaler, then the nebulizer)  that you know would usually make you short of breath  Zpak should clear mucus  Clonidine  0.1 mg twice daily until you see Dr Darien Eden  (can't tol Beta blockers)  Please schedule a follow up visit in 3 months but call sooner if needed    [facility] 11/19/23  Pancreatitis    11/30/2023  f/u ov/[place] office/Emily Pope re: GOLD 0/ emphysema  maint on no rx   Chief Complaint  Patient presents with   COPD   Shortness of Breath  Dyspnea:  able to do [employer] on day of ov slow pace  Cough: mostly yellowish just finished levaquin   Sleeping: on side one neck pillow s   resp cc  SABA use: avg  2-3 x per week hfa/ neb not at all  02: none  Rec My office will be contacting you by  phone for referral to [facility]    Sinus CT 01/11/24  clear paranasal sinuses with patent sinus drainage pathways. 2. Leftward nasal septal deviation.    01/23/2024  f/u ov/[place] office/Emily Pope re: GOLD 0 copd/ emphysema  maint on no rx   Chief Complaint  Patient presents with   Follow-up    3 month follow up   Dyspnea:  [employer] ok  Cough: lots of yellowish mucus in am mostly  Sleeping: on side flat bed one pillow   resp cc  SABA use: hfa and neb but not using either  02: none  Rec Try substituting  aciphex  20 mg  for pantorazole 40  (still  Take it 30-60 min before first meal of the day)    Allergy screen 01/23/2024 >  Eos 0.3 /  IgE  15  alpha one phenotype  MM / evel 146    03/21/2024  f/u ov/[place] office/Emily Pope re: cough x 2017 when took enfienzie for lung ca worse x 3-4 months maint on gerd rx   Chief Complaint  Patient presents with   Acute Visit    Coughing x3-4 months has follow up with pcp and ent given z-pak 3 weeks ago , she has taken mucinex to help with cough. She said that it worse  at night  going to the morning .  Afrin and xyzal  Dyspnea:  still able to walk at [employer] leaning on cart due to  back  Cough: worse first thing in am thick / yellow/ green with multiple abx allergies/ intolerance    Sleeping: flat bed on side one pillow resp cc  SABA use: qid hfa 02: none  Rec Doxycylcine 100 mg twice daily before eat with a large glass of water   Nebulizer albuterol  2.5 mg four times daily  Twice daily add budesonide  0.25 mg to the albuterol   Mucinex dm 1200 mg every 12 hours  and take enough tramadol   50mg   to eliminate the cough  = up to 2 every 4 hours x  5days then try to taper to as needed  nasal steroids (like flonase) have no immediate benefit   Prednisone  10 mg take  4 each am x 2 days,   2 each am x 2 days,  1 each am x 2 days and stop   Please schedule a follow up office visit in 2 weeks, call sooner if needed with all medications /inhalers/ solutions in hand    04/05/2024  f/u ov/[place] office/Emily Pope re: cough x 2017  maint on alb/bud did  bring meds / no flutter  Chief Complaint  Patient presents with   Cough  Dyspnea:  slow walk [employer] shopping  Cough: minimally productive, very raspy assoc with watery rhinitis / xyzal helps  Sleeping: flat bed on side one pillow  resp cc  SABA use: using neb only as rec 02: none  Rec For cough/ congestion >  mucinex dm  up to maximum of  1200 mg every 12 hours and use the flutter valve as much as you can when coughing to prevent airway trauma   Add tramadol  up to 2 every 4 hours until no longer coughing x 3 days then taper as tolerated  Prednisone  10 mg take  4 each am x 2 days,   2 each am x 2 days,  1 each am x 2 days and stop   Please schedule a follow up office visit in 6 weeks, call sooner if needed     05/17/2024  f/u ov/[place] office/Emily Pope re: cough x 2017  maint on alb/bud bid  changed xyzral to clariton    Chief Complaint  Patient presents with   Shortness of Breath   COPD  Dyspnea:  more  difficult since last ov, no better on prednisone   Cough: attributes to pnds  Sleeping: flat bed with one pillow on side/ disturbed by nasal congestion   resp cc  SABA use: 3 alb neb per day  02: none    No obvious day to day or daytime variability or assoc excess/ purulent sputum or mucus plugs or hemoptysis or cp or chest tightness, subjective wheeze or overt   hb symptoms.    Also denies any obvious fluctuation of symptoms with weather or environmental changes or other aggravating or alleviating factors except as outlined above   No unusual exposure hx or h/o childhood pna/ asthma or knowledge of premature birth.  Current Allergies, Complete Past Medical History, Past Surgical History, Family History, and Social History were reviewed in [employer] record.  ROS  The following are not active complaints unless bolded Hoarseness, sore throat, dysphagia, dental problems, itching, sneezing,  nasal congestion or discharge of excess mucus or purulent secretions, ear ache,   fever, chills, sweats, unintended wt loss or wt gain, classically pleuritic or exertional cp,  orthopnea pnd or arm/hand swelling  or leg swelling, presyncope, palpitations, abdominal pain, anorexia, nausea, vomiting, diarrhea  or change in bowel habits or change in bladder habits, change in stools or change in urine, dysuria, hematuria,  rash, arthralgias, visual complaints, headache, numbness, weakness or ataxia or problems with walking or coordination,  change in mood or  memory.        Current Meds  Medication Sig   acetaminophen  (TYLENOL  8 HOUR ARTHRITIS PAIN) 650 MG CR tablet Take 650 mg by mouth every 8 (eight) hours as needed for pain.   albuterol  (PROVENTIL  HFA;VENTOLIN  HFA) 108 (90 Base) MCG/ACT inhaler Inhale 2 puffs into the lungs every 6 (six) hours as needed for wheezing or shortness of breath.   albuterol  (PROVENTIL ) (2.5 MG/3ML) 0.083% nebulizer solution Take 3 mLs (2.5 mg total) by  nebulization every 4 (four) hours as needed.   ALPRAZolam  (XANAX ) 0.25 MG tablet Take 0.25 mg by mouth daily.   aspirin EC 81 MG tablet Take 1 tablet by mouth daily.   budesonide  (PULMICORT ) 0.25 MG/2ML nebulizer solution Take 2 mLs (0.25 mg total) by nebulization 2 (two) times daily.   buPROPion (WELLBUTRIN XL) 300 MG 24 hr tablet Take 300 mg by mouth daily.   cloNIDine  (CATAPRES ) 0.1 MG tablet Take 1 tablet by mouth twice daily   Cyanocobalamin  5000 MCG TBDP Dissolve in the mouth.   dextromethorphan-guaiFENesin (MUCINEX DM) 30-600 MG 12hr tablet Take 1 tablet by mouth 2 (two) times daily.   diphenhydrAMINE  (BENADRYL ) 25 mg capsule Take 25 mg by mouth every 6 (six) hours as needed.   famotidine  (PEPCID ) 20 MG tablet One after supper   hydrochlorothiazide (HYDRODIURIL) 12.5 MG tablet Take 12.5 mg by mouth daily as needed (if blood pressure is greater than 128/82).   levocetirizine (XYZAL) 5 MG tablet Take 5 mg by mouth every evening.   losartan (COZAAR) 50 MG tablet Take 50 mg by mouth daily as needed (if blood pressure is greater than 128/82).   Melatonin 10 MG TABS Take 10 mg by mouth at bedtime.   Multiple Vitamin (MULTIVITAMIN) tablet Take 1 tablet by mouth daily.   naproxen sodium (ALEVE) 220 MG tablet  Take 220-440 mg by mouth 2 (two) times daily as needed (pain.).   nicotine polacrilex (NICORETTE) 4 MG lozenge Take 4 mg by mouth as needed for smoking cessation.   RABEprazole  (ACIPHEX ) 20 MG tablet TAKE 1 TABLET BY MOUTH 30 TO 60 MINUTES BEFORE FIRST MEAL OF THE DAY   rosuvastatin (CRESTOR) 10 MG tablet Take 10 mg by mouth at bedtime.   traMADol  (ULTRAM ) 50 MG tablet Take 50 mg by mouth every 6 (six) hours as needed.   VEOZAH 45 MG TABS Take 1 tablet by mouth daily.   Vitamin D , Ergocalciferol , (DRISDOL ) 1.25 MG (50000 UNIT) CAPS capsule Take by mouth.                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      Objective:    Wts  05/17/2024        131  04/05/2024        129  03/21/2024        127  01/23/2024        120  11/30/2023       116   10/21/23 122 lb (55.3 kg)  09/12/23 122 lb (55.3 kg)  08/10/23 128 lb (58.1 kg)    Vital signs reviewed  05/17/2024  - Note at rest 02 sats  94 % on RA   General appearance:    amb wf/ harsh upper airway cough and pseudowheeze    HEENT : Oropharynx  clear / edentulous     Nasal turbinates non-specific edema s excess secretions    NECK :  without  apparent JVD/ palpable Nodes/TM    LUNGS: no acc muscle use,  Nl contour chest which is clear to A and P bilaterally without cough on insp or exp maneuvers   CV:  RRR  no s3 or murmur or increase in P2, and no edema   ABD:  soft and nontender   MS:  Gait nl   ext warm without deformities Or obvious joint restrictions  calf tenderness, cyanosis or clubbing    SKIN: warm and dry without lesions    NEURO:  alert, approp, nl sensorium with  no motor or cerebellar deficits apparent.        Assessment

## 2024-05-17 ENCOUNTER — Ambulatory Visit (INDEPENDENT_AMBULATORY_CARE_PROVIDER_SITE_OTHER): Admitting: Internal Medicine

## 2024-05-17 ENCOUNTER — Encounter: Payer: Self-pay | Admitting: Internal Medicine

## 2024-05-17 VITALS — BP 119/75 | HR 74 | Ht 60.0 in | Wt 131.6 lb

## 2024-05-17 DIAGNOSIS — R0602 Shortness of breath: Secondary | ICD-10-CM | POA: Diagnosis not present

## 2024-05-17 DIAGNOSIS — J310 Chronic rhinitis: Secondary | ICD-10-CM

## 2024-05-17 DIAGNOSIS — J449 Chronic obstructive pulmonary disease, unspecified: Secondary | ICD-10-CM

## 2024-05-17 DIAGNOSIS — Z87891 Personal history of nicotine dependence: Secondary | ICD-10-CM

## 2024-05-17 MED ORDER — AZELASTINE-FLUTICASONE 137-50 MCG/ACT NA SUSP: 1.0000 | Freq: Two times a day (BID) | NASAL | 11 refills | Status: DC

## 2024-05-17 NOTE — Patient Instructions (Addendum)
 Try dymista one twice daily in place of flonase   For drainage / throat tickle  stop benadry try take CHLORPHENIRAMINE  4 mg  ("Allergy Relief" 4mg   at [facility] should be easiest to find in the blue box usually on bottom shelf)  take one every 4 hours as needed - extremely effective and inexpensive over the counter- may cause drowsiness so start with just a dose or two an hour before bedtime and see how you tolerate it before trying in daytime.   The other over the counter for stuffy nose =  Mucinex D instead of dm   Please schedule a follow up visit in 3 months but call sooner if needed  with all medications /inhalers/ solutions in hand so we can verify exactly what you are taking. This includes all medications from all doctors and over the counters

## 2024-05-17 NOTE — Assessment & Plan Note (Signed)
 Quit smoking 2017/ MM for alpha one  with dx of lung ca - PFT 02/22/23 >> FEV1 1.95 (96%), FEV1% 80, TLC 4.83 (108%), RV 2.54 (133%), DLCO 52%  worse p SABA and no curvature on inp/exp to indicate true airflow obstruction  - 09/12/2023  After extensive coaching inhaler device,  effectiveness =    80% with hfa so just rx as AB with albuterol  more appropriately  09/12/2023   Walked on RA  x  3  lap(s) =  approx 450  ft  @ mod pace, stopped due to end of study with lowest 02 sats 96%  p saba prior  - Allergy screen 01/23/2024 >  Eos 0.3 /  IgE  15  alpha one phenotype  MM / evel 146  - 03/21/2024 change rx to alb/bud 0.25 mg bid as main cc = cough either due to AB or UACS  - 04/05/2024  After extensive coaching inhaler device,  effectiveness =    75% (short ti)  - 04/05/2024 flutter valve training  - 05/17/2024   Walked on RA  x  2  lap(s) =  approx 450  ft  @ mod pace, stopped due to end of sutdy  with lowest 02 sats 95% and mild sob   Main findings are upper airway today, no change in lower airway rx with bid alb/budesonide 

## 2024-05-17 NOTE — Assessment & Plan Note (Signed)
 03/21/2024 started afrin/ flonase regimen p neg ent eval  03/01/24  - trial of dymista/ 1st gen H1 blockers per guidelines  and prn Mucinex D   Absence of response to pred rules against allergy so will try above max topical rx   F/u can be q 3 m, sooner prn          Each maintenance medication was reviewed in detail including emphasizing most importantly the difference between maintenance and prns and under what circumstances the prns are to be triggered using an action plan format where appropriate.  Total time for H and P, chart review, counseling, reviewing neb/nasal device(s) , directly observing portions of ambulatory 02 saturation study/ and generating customized AVS unique to this office visit / same day charting =   for multiple  refractory respiratory  symptoms of uncertain etiology

## 2024-05-20 ENCOUNTER — Other Ambulatory Visit: Payer: Self-pay | Admitting: Internal Medicine

## 2024-06-06 DIAGNOSIS — R070 Pain in throat: Secondary | ICD-10-CM | POA: Diagnosis not present

## 2024-06-06 DIAGNOSIS — Z299 Encounter for prophylactic measures, unspecified: Secondary | ICD-10-CM | POA: Diagnosis not present

## 2024-06-06 DIAGNOSIS — J029 Acute pharyngitis, unspecified: Secondary | ICD-10-CM | POA: Diagnosis not present

## 2024-06-06 DIAGNOSIS — J302 Other seasonal allergic rhinitis: Secondary | ICD-10-CM | POA: Diagnosis not present

## 2024-06-19 ENCOUNTER — Other Ambulatory Visit: Payer: Self-pay | Admitting: Internal Medicine

## 2024-07-10 ENCOUNTER — Ambulatory Visit: Payer: Self-pay

## 2024-07-10 DIAGNOSIS — Z299 Encounter for prophylactic measures, unspecified: Secondary | ICD-10-CM | POA: Diagnosis not present

## 2024-07-10 DIAGNOSIS — I10 Essential (primary) hypertension: Secondary | ICD-10-CM | POA: Diagnosis not present

## 2024-07-10 DIAGNOSIS — R053 Chronic cough: Secondary | ICD-10-CM | POA: Diagnosis not present

## 2024-07-10 NOTE — Telephone Encounter (Signed)
 FYI Only or Action Required?: Action required by provider: request for appointment.  Patient is followed in Pulmonology for COPD, last seen on 05/17/2024 by [name] Ozell NOVAK, MD.  Called Nurse Triage reporting No chief complaint on file..  Symptoms began several days ago.  Interventions attempted: Rescue inhaler, Maintenance inhaler, and Nebulizer treatments.  Symptoms are: gradually worsening.  Triage Disposition: See Physician Within 24 Hours  Patient/caregiver understands and will follow disposition?: Yes   Copied from CRM [id number]. Topic: Clinical - Red Word Triage >> Jul 10, 2024  1:42 PM Leila BROCKS wrote: Red Word that prompted transfer to Nurse Triage: Patient [phone] states wants to be seen sooner than 08/16/24 at 9:30 am. Patient states saw pcp today and was advised they cannot help, patient needs to see Dr. [name]. Patient states dry coughing, gobbs of green colored some so dark it looks black mucous, shortness of breath, wheezing, weak, and pain in chest and throat. Patient denies dizziness, nor fever. Please advise. Reason for Disposition  [1] Continuous (nonstop) coughing interferes with work or school AND [2] no improvement using cough treatment per Care Advice  Answer Assessment - Initial Assessment Questions 1. ONSET: When did the cough begin?      Chronic  2. SEVERITY: How bad is the cough today?      Constant  3. SPUTUM: Describe the color of your sputum (e.g., none, dry cough; clear, white, yellow, green)     Dark Green, Almost  Black  4. HEMOPTYSIS: Are you coughing up any blood? If Yes, ask: How much? (e.g., flecks, streaks, tablespoons, etc.)     None  5. DIFFICULTY BREATHING: Are you having difficulty breathing? If Yes, ask: How bad is it? (e.g., mild, moderate, severe)      Mild to Moderate  6. FEVER: Do you have a fever? If Yes, ask: What is your temperature, how was it measured, and when did it start?     None  7. CARDIAC HISTORY:  Do you have any history of heart disease? (e.g., heart attack, congestive heart failure)      Hypertension  8. LUNG HISTORY: Do you have any history of lung disease?  (e.g., pulmonary embolus, asthma, emphysema)     COPD  9. PE RISK FACTORS: Do you have a history of blood clots? (or: recent major surgery, recent prolonged travel, bedridden)     No  10. OTHER SYMPTOMS: Do you have any other symptoms? (e.g., runny nose, wheezing, chest pain)       Congestion  11. PREGNANCY: Is there any chance you are pregnant? When was your last menstrual period?       No and No  12. TRAVEL: Have you traveled out of the country in the last month? (e.g., travel history, exposures)       Unsure  Protocols used: Cough - Acute Productive-A-AH

## 2024-07-11 ENCOUNTER — Encounter: Payer: Self-pay | Admitting: Internal Medicine

## 2024-07-11 ENCOUNTER — Ambulatory Visit (INDEPENDENT_AMBULATORY_CARE_PROVIDER_SITE_OTHER): Admitting: Internal Medicine

## 2024-07-11 VITALS — BP 106/70 | HR 93 | Temp 98.1°F | Ht 60.0 in | Wt 132.6 lb

## 2024-07-11 DIAGNOSIS — R058 Other specified cough: Secondary | ICD-10-CM

## 2024-07-11 DIAGNOSIS — Z87891 Personal history of nicotine dependence: Secondary | ICD-10-CM

## 2024-07-11 MED ORDER — GABAPENTIN 100 MG PO CAPS: 100.0000 mg | ORAL_CAPSULE | Freq: Four times a day (QID) | ORAL | 2 refills | Status: DC

## 2024-07-11 MED ORDER — PREDNISONE 10 MG PO TABS
ORAL_TABLET | ORAL | 11 refills | Status: DC
Start: 2024-07-11 — End: 2024-08-16

## 2024-07-11 MED ORDER — TRAMADOL HCL 50 MG PO TABS
50.0000 mg | ORAL_TABLET | ORAL | 0 refills | Status: AC | PRN
Start: 2024-07-11 — End: 2024-07-16

## 2024-07-11 NOTE — Assessment & Plan Note (Addendum)
 Onset ? 2023 / worse on trelegy 100 > d/c'd 09/09/23 -  max gerd rx 09/12/2023 >>> improved initially off trelegy until caught cold  -  referred to ENT 11/30/2023 >>> seen  01/02/24 c/w chronic sinusitis > CT sinus 01/11/24 1. Small bilateral maxillary sinus mucous retention cysts. Otherwise clear paranasal sinuses with patent sinus drainage pathways. 2. Leftward nasal septal deviation.  >>> 07/11/2024 Prednisone  10 mg take  4 each am x 2 days,   2 each am x 2 days,  1 each am x 2 days and stop / Take delsym two tsp every 12 hours and supplement if needed with  tramadol  50 mg up to 2 every 4 hours to suppress the urge to cough. And start on gabapentin  titrated to as high as 1200 mg daily   Of the three most common causes of  Sub-acute / recurrent or chronic cough, only one (GERD)  can actually contribute to/ trigger  the other two (asthma and post nasal drip syndrome)  and perpetuate the cylce of cough.  While not intuitively obvious, many patients with chronic low grade reflux do not cough until there is a primary insult that disturbs the protective epithelial barrier and exposes sensitive nerve endings.   This is typically viral but can due to PNDS and  either may apply here.    >>>  The point is that once this occurs, it is difficult to eliminate the cycle  using anything but a maximally effective acid suppression regimen at least in the short run, accompanied by an appropriate diet to address non acid GERD and control / eliminate the cough itself with gabapentin  as above long term, tramadol  short term,  and  also added 6 day taper off  Prednisone  starting at 40 mg per day in case of component of Th-2 driven upper or lower airways inflammation (if cough responds short term only to relapse before return while will on full rx for uacs (as above), then that would point to allergic rhinitis/ asthma or eos bronchitis as alternative dx)    Discussed in detail all the  indications, usual  risks and  alternatives  relative to the benefits with patient who agrees to proceed with Rx as outlined.      F/u 6 weeks with all meds in hand using a trust but verify approach to confirm accurate Medication  Reconciliation The principal here is that until we are certain that the  patients are doing what we've asked, it makes no sense to ask them to do more.   Each maintenance medication was reviewed in detail including emphasizing most importantly the difference between maintenance and prns and under what circumstances the prns are to be triggered using an action plan format where appropriate.  Total time for H and P, chart review, counseling, reviewing hfa and neb  device(s) and generating customized AVS unique to this office visit / same day charting = 41 min  for multiple chronic   refractory respiratory  symptoms of uncertain etiology

## 2024-07-11 NOTE — Progress Notes (Signed)
 Emily Pope, female    DOB: 09/24/1956    MRN: [id number]   Brief patient profile:  42 yowf  MM/quit smoking 2017 Sood pt self referred back to pulmonary clinic in [facility]  09/12/2023 by for copd/ GOLD 0   PFT 02/22/23 >> FEV1 1.95 (96%), Ratio 0.80, TLC 4.83 (108%), RV 2.54 (133%), DLCO 52% and  worse p SABA  Lung ca > RT / chemo completed in Dec 2017 f/u by K    History of Present Illness  09/12/2023  Pulmonary/ 1st office eval/ Emily Pope / [place] Office off trelegy since 09/09/23  Chief Complaint  Patient presents with   Centrilobular emphysema   Dyspnea:  MMRC1 = can walk nl pace, flat grade, can't hurry or go uphills or steps s sob   Cough: sensation of chest congestion creamy x 2017 when finished RT Sleep: level bed /one pillow s resp wakes up dry and thirty  SABA use: once or twice daily  02: none  Rec Pantoprazole  (protonix ) 40 mg   Take  30-60 min before first meal of the day and Pepcid  (famotidine )  20 mg after supper until return to office  Only use your albuterol  as a rescue medication  Also  Ok to try albuterol  15 min before an activity (on alternating days)  that you know would usually make you short of breath   For cough/ congestion >   Mucinex dm  up to maximum of  1200 mg every 12 hours and use the flutter valve as much as you can   Prednisone  10 mg take  4 each am x 2 days,   2 each am x 2 days,  1 each am x 2 days and stop    Please schedule a follow up office visit in 6 weeks, call sooner if needed with all medications /inhalers/ solutions in hand     10/21/2023  f/u ov/[place] office/Steadman Prosperi re: GOLD 0 copd/ emphysema on CT / ? UACS  maint on prn saba -  did  bring meds  Chief Complaint  Patient presents with   COPD    Gold 0  Dyspnea:  improved off trelegy but gets chest tightness reproducibly with house work - has not tried saba prior to ex but helps if sits down and then takes it  Cough: yellowish green worse x sev weeks  Sleeping: flat bed one  pillow no   resp cc  SABA use: as above  02: none  Prednisone  for neck did not help her chest tightness  Rec Also  Ok to try albuterol  15 min before an activity (on alternating days with inhaler, then the nebulizer)  that you know would usually make you short of breath  Zpak should clear mucus  Clonidine  0.1 mg twice daily until you see Dr Rosamond  (can't tol Beta blockers)  Please schedule a follow up visit in 3 months but call sooner if needed    [facility] 11/19/23  Pancreatitis    11/30/2023  f/u ov/[place] office/Emily Pope re: GOLD 0/ emphysema  maint on no rx   Chief Complaint  Patient presents with   COPD   Shortness of Breath  Dyspnea:  able to do [employer] on day of ov slow pace  Cough: mostly yellowish just finished levaquin   Sleeping: on side one neck pillow s   resp cc  SABA use: avg  2-3 x per week hfa/ neb not at all  02: none  Rec My office will be contacting you by  phone for referral to [facility] > neg w/u    Sinus CT 01/11/24  clear paranasal sinuses with patent sinus drainage pathways. 2. Leftward nasal septal deviation.    01/23/2024  f/u ov/[place] office/Emily Pope re: GOLD 0 copd/ emphysema  maint on no rx   Chief Complaint  Patient presents with   Follow-up    3 month follow up   Dyspnea:  [employer] ok  Cough: lots of yellowish mucus in am mostly  Sleeping: on side flat bed one pillow   resp cc  SABA use: hfa and neb but not using either  02: none  Rec Try substituting  aciphex  20 mg  for pantorazole 40  (still  Take it 30-60 min before first meal of the day)    Allergy screen 01/23/2024 >  Eos 0.3 /  IgE  15  alpha one phenotype  MM / evel 146    03/21/2024  f/u ov/[place] office/Emily Pope re: cough x 2017 when took enfienzie for lung ca worse x 3-4 months maint on gerd rx   Chief Complaint  Patient presents with   Acute Visit    Coughing x3-4 months has follow up with pcp and ent given z-pak 3 weeks ago , she has taken mucinex to help with cough. She said that  it worse at night  going to the morning .  Afrin and xyzal  Dyspnea:  still able to walk at [employer] leaning on cart due to  back  Cough: worse first thing in am thick / yellow/ green with multiple abx allergies/ intolerance    Sleeping: flat bed on side one pillow resp cc  SABA use: qid hfa 02: none  Rec Doxycylcine 100 mg twice daily before eat with a large glass of water   Nebulizer albuterol  2.5 mg four times daily  Twice daily add budesonide  0.25 mg to the albuterol   Mucinex dm 1200 mg every 12 hours  and take enough tramadol   50mg   to eliminate the cough  = up to 2 every 4 hours x  5days then try to taper to as needed  nasal steroids (like flonase) have no immediate benefit   Prednisone  10 mg take  4 each am x 2 days,   2 each am x 2 days,  1 each am x 2 days and stop   Please schedule a follow up office visit in 2 weeks, call sooner if needed with all medications /inhalers/ solutions in hand    04/05/2024  f/u ov/ office/Emily Pope re: cough x 2017  maint on alb/bud did  bring meds / no flutter  Chief Complaint  Patient presents with   Cough  Dyspnea:  slow walk [employer] shopping  Cough: minimally productive, very raspy assoc with watery rhinitis / xyzal helps  Sleeping: flat bed on side one pillow  resp cc  SABA use: using neb only as rec 02: none  Rec For cough/ congestion >  mucinex dm  up to maximum of  1200 mg every 12 hours and use the flutter valve as much as you can when coughing to prevent airway trauma   Add tramadol  up to 2 every 4 hours until no longer coughing x 3 days then taper as tolerated  Prednisone  10 mg take  4 each am x 2 days,   2 each am x 2 days,  1 each am x 2 days and stop   Please schedule a follow up office visit in 6 weeks, call sooner if needed  05/17/2024  f/u ov/[place] office/Emily Pope re: cough x 2017  maint on alb/bud bid  changed xyzral to clariton    Chief Complaint  Patient presents with   Shortness of Breath   COPD  Dyspnea:   more difficult since last ov, no better on prednisone   Cough: attributes to pnds  Sleeping: flat bed with one pillow on side/ disturbed by nasal congestion   resp cc  SABA use: 3 alb neb per day  02: none  Rec Try dymista  one twice daily in place of flonase  For drainage / throat tickle  stop benadry try take CHLORPHENIRAMINE  4 mg   The other over the counter for stuffy nose =  Mucinex D instead of dm  Please schedule a follow up visit in 3 months but call sooner if needed  with all medications /inhalers/ solutions in hand     07/11/2024  ACUTE ov/[place] office/Emily Pope re: cough x 2017  maint on saba/budesonide  and max gerd rx  did  bring meds  Chief Complaint  Patient presents with   Acute Visit    Pt c/o increased cough x 2 wks- prod with thick, dark green sputum.  She gets SOB when she gets into coughing spells.   Dyspnea:  walking in house ok  Cough: severe coughing fits despite h1, mucinex d/ max gerd rx / green mucus not better with abx Sleeping: flat bed one pillow bothered  stuffy nose      SABA use: not helpful  02: none   No obvious day to day or daytime variability or assoc excess/ purulent sputum or mucus plugs or hemoptysis or cp or chest tightness, subjective wheeze or overt sinus or hb symptoms.    Also denies any obvious fluctuation of symptoms with weather or environmental changes or other aggravating or alleviating factors except as outlined above   No unusual exposure hx or h/o childhood pna/ asthma or knowledge of premature birth.  Current Allergies, Complete Past Medical History, Past Surgical History, Family History, and Social History were reviewed in [employer] record.  ROS  The following are not active complaints unless bolded Hoarseness, sore throat, dysphagia, dental problems, itching, sneezing,  nasal congestion or discharge of excess mucus or purulent secretions, ear ache,   fever, chills, sweats, unintended wt loss or wt gain,  classically pleuritic or exertional cp,  orthopnea pnd or arm/hand swelling  or leg swelling, presyncope, palpitations, abdominal pain, anorexia, nausea, vomiting, diarrhea  or change in bowel habits or change in bladder habits, change in stools or change in urine, dysuria, hematuria,  rash, arthralgias, visual complaints, headache, numbness, weakness or ataxia or problems with walking or coordination,  change in mood or  memory.        Current Meds  Medication Sig   acetaminophen  (TYLENOL  8 HOUR ARTHRITIS PAIN) 650 MG CR tablet Take 650 mg by mouth every 8 (eight) hours as needed for pain.   albuterol  (PROVENTIL  HFA;VENTOLIN  HFA) 108 (90 Base) MCG/ACT inhaler Inhale 2 puffs into the lungs every 6 (six) hours as needed for wheezing or shortness of breath.   albuterol  (PROVENTIL ) (2.5 MG/3ML) 0.083% nebulizer solution Take 3 mLs (2.5 mg total) by nebulization every 4 (four) hours as needed.   aspirin EC 81 MG tablet Take 1 tablet by mouth daily.   Azelastine -Fluticasone  (DYMISTA ) 137-50 MCG/ACT SUSP Place 1 puff into the nose 2 (two) times daily.   budesonide  (PULMICORT ) 0.25 MG/2ML nebulizer solution Take 2 mLs (0.25 mg total) by  nebulization 2 (two) times daily.   buPROPion (WELLBUTRIN XL) 300 MG 24 hr tablet Take 300 mg by mouth daily.   Chlorpheniramine Maleate (CHLOR-TABLETS PO) Take by mouth as needed.   cloNIDine  (CATAPRES ) 0.1 MG tablet Take 1 tablet by mouth twice daily   Cyanocobalamin  5000 MCG TBDP Dissolve in the mouth.   docusate sodium (COLACE) 100 MG capsule Take 100 mg by mouth in the morning.   famotidine  (PEPCID ) 20 MG tablet One after supper   hydrochlorothiazide (HYDRODIURIL) 12.5 MG tablet Take 12.5 mg by mouth daily as needed (if blood pressure is greater than 128/82).   levocetirizine (XYZAL) 5 MG tablet Take 5 mg by mouth every evening.   losartan (COZAAR) 50 MG tablet Take 50 mg by mouth daily as needed (if blood pressure is greater than 128/82).   Melatonin 10 MG TABS  Take 10 mg by mouth at bedtime.   metoprolol  tartrate (LOPRESSOR ) 25 MG tablet Take 0.5 tablets (12.5 mg total) by mouth 2 (two) times daily.   naproxen sodium (ALEVE) 220 MG tablet Take 220-440 mg by mouth 2 (two) times daily as needed (pain.).   oxymetazoline (AFRIN) 0.05 % nasal spray Place 1 spray into both nostrils 2 (two) times daily.   psyllium (REGULOID) 0.52 g capsule Take 0.52 g by mouth daily.   RABEprazole  (ACIPHEX ) 20 MG tablet TAKE 1 TABLET BY MOUTH 30 TO 60 MINUTES BEFORE FIRST MEAL OF THE DAY   rosuvastatin (CRESTOR) 10 MG tablet Take 10 mg by mouth at bedtime.   VEOZAH 45 MG TABS Take 1 tablet by mouth daily.   Vitamin D , Ergocalciferol , (DRISDOL ) 1.25 MG (50000 UNIT) CAPS capsule Take by mouth.              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      Objective:    Wts  07/11/2024        132  05/17/2024        131  04/05/2024        129  03/21/2024        127  01/23/2024        120  11/30/2023       116   10/21/23 122 lb (55.3 kg)  09/12/23 122 lb (55.3 kg)  08/10/23 128 lb (58.1 kg)    Vital signs reviewed  07/11/2024  - Note at rest 02 sats  97% on RA   General appearance:    amb wf harsh dry barking cough    HEENT : Oropharynx  clear      Nasal turbinates nl    NECK :  without  apparent JVD/ palpable Nodes/TM    LUNGS: no acc muscle use,  Nl contour chest which is clear to A and P bilaterally without cough on insp or exp maneuvers   CV:  RRR  no s3 or murmur or increase in P2, and no edema   ABD:  soft and nontender   MS:  Gait nl   ext warm without deformities Or obvious joint restrictions  calf tenderness, cyanosis or clubbing    SKIN:  warm  and dry without lesions    NEURO:  alert, approp, nl sensorium with  no motor or cerebellar deficits apparent.      Assessment

## 2024-07-11 NOTE — Telephone Encounter (Signed)
 Pt has appt at 3 pm today with Dr. [name].

## 2024-07-11 NOTE — Patient Instructions (Addendum)
 Prednisone  10 mg take  4 each am x 2 days,   2 each am x 2 days,  1 each am x 2 days and stop    Gabapentin  100 mg one at bedtime for a week, then 1 with bfast    for a week, then add done at supper for a week then 1 at lunch for week and keep adding one pill a week until cough is gone or reach 1200 mg per refill  Take mucinex DM 1200 mg  every 12 hours and supplement if needed with  tramadol  50 mg up to 2 every 4 hours to suppress the urge to cough. Swallowing water  and/or using ice chips/non mint and menthol containing candies (such as lifesavers or sugarless jolly ranchers) are also effective.  You should rest your voice and avoid activities that you know make you cough.  Once you have eliminated the cough for 3 straight days try reducing the tramadol  first,  then the Mucinex dm  as tolerated.    Please schedule a follow up office visit in 6 weeks, call sooner if needed - bring flutter valve

## 2024-07-13 ENCOUNTER — Ambulatory Visit: Payer: Self-pay

## 2024-07-13 NOTE — Telephone Encounter (Signed)
 FYI Only or Action Required?: Action required by provider: clinical question for provider.  Patient was last seen on 7/16 by Dr. [name] Fortis Nurse Triage reporting Medication Question.  Symptoms began yesterday.  Interventions attempted: Prescription medications: Inhaler/ Nebulizer .  Symptoms are: unchanged.  Triage Disposition: Call PCP Now  Patient/caregiver understands and will follow disposition?: No, wishes to speak with PCP   **See note Below**                          Copied from CRM [id number]. Topic: Clinical - Red Word Triage >> Jul 13, 2024 12:33 PM Russell PARAS wrote: Red Word that prompted transfer to Nurse Triage:   Was recently prescribed gabapentin  by [name] Koch first capsule last night, but was unable to sleep at all last night When she gets up and moves her oxygen  drops, today was 79 If she sits down, oxygen  levels improve.   SOB when oxygen  drops, will take slow deep breaths that improve this Using inhaler and nebulizer, with no improvement.   Is concerned that the gabapentin . Reason for Disposition  [1] Caller has URGENT medicine question about med that primary care doctor (or NP/PA) or specialist prescribed AND [2] triager unable to answer question  Answer Assessment - Initial Assessment Questions 1. NAME of MEDICINE: What medicine(s) are you calling about?   She was started on Gabapentin  last Wednesday, she took the first dose last night. She states upon ambulating her 02 levels drop. The nebulizer is not working or the inhaler. She reports the medication is causing insomnia as well. She is not on supplemental 02. Her levels have dropped into the 70's overnight. Her current 02 level during call was 97%. Unable to schedule appointment as there is no sooner availability, she is unable to visit alternative location due to transportation issues. She agrees to seek care in the ED if 02 drops into the 70's, or if SOB worsens. She will await a  call/message from Dr.Wert on the medication side effects.  Protocols used: Medication Question Call-A-AH

## 2024-07-16 NOTE — Telephone Encounter (Signed)
 ATC x2 busy tone

## 2024-07-17 ENCOUNTER — Other Ambulatory Visit: Payer: Self-pay | Admitting: Internal Medicine

## 2024-07-19 DIAGNOSIS — I10 Essential (primary) hypertension: Secondary | ICD-10-CM | POA: Diagnosis not present

## 2024-07-19 DIAGNOSIS — Z0000 Encounter for general adult medical examination without abnormal findings: Secondary | ICD-10-CM | POA: Diagnosis not present

## 2024-07-19 DIAGNOSIS — I700 Atherosclerosis of aorta: Secondary | ICD-10-CM | POA: Diagnosis not present

## 2024-07-19 DIAGNOSIS — J449 Chronic obstructive pulmonary disease, unspecified: Secondary | ICD-10-CM | POA: Diagnosis not present

## 2024-07-19 DIAGNOSIS — Z299 Encounter for prophylactic measures, unspecified: Secondary | ICD-10-CM | POA: Diagnosis not present

## 2024-07-23 ENCOUNTER — Other Ambulatory Visit: Payer: Self-pay | Admitting: Internal Medicine

## 2024-07-24 DIAGNOSIS — I10 Essential (primary) hypertension: Secondary | ICD-10-CM | POA: Diagnosis not present

## 2024-07-24 DIAGNOSIS — Z299 Encounter for prophylactic measures, unspecified: Secondary | ICD-10-CM | POA: Diagnosis not present

## 2024-07-24 DIAGNOSIS — R531 Weakness: Secondary | ICD-10-CM | POA: Diagnosis not present

## 2024-07-30 DIAGNOSIS — J440 Chronic obstructive pulmonary disease with acute lower respiratory infection: Secondary | ICD-10-CM | POA: Diagnosis not present

## 2024-07-30 DIAGNOSIS — C3411 Malignant neoplasm of upper lobe, right bronchus or lung: Secondary | ICD-10-CM | POA: Diagnosis not present

## 2024-07-30 DIAGNOSIS — R053 Chronic cough: Secondary | ICD-10-CM | POA: Diagnosis not present

## 2024-07-30 DIAGNOSIS — J9610 Chronic respiratory failure, unspecified whether with hypoxia or hypercapnia: Secondary | ICD-10-CM | POA: Diagnosis not present

## 2024-07-30 DIAGNOSIS — I272 Pulmonary hypertension, unspecified: Secondary | ICD-10-CM | POA: Diagnosis not present

## 2024-07-30 DIAGNOSIS — Z299 Encounter for prophylactic measures, unspecified: Secondary | ICD-10-CM | POA: Diagnosis not present

## 2024-08-13 ENCOUNTER — Other Ambulatory Visit: Payer: Self-pay | Admitting: Internal Medicine

## 2024-08-13 NOTE — Progress Notes (Signed)
 Emily Pope, female    DOB: [birth year]    MRN: [id number]   Brief patient profile:  69 yowf  MM/quit smoking 2017 [place] pt self referred back to pulmonary clinic in [facility]  09/12/2023 by for copd/ GOLD 0   PFT 02/22/23 >> FEV1 1.95 (96%), Ratio 0.80, TLC 4.83 (108%), RV 2.54 (133%), DLCO 52% and  worse p SABA  Lung ca > RT / chemo completed in Dec 2017 f/u by K    History of Present Illness  09/12/2023  Pulmonary/ 1st office eval/ [name] / [place] [facility] off trelegy since 09/09/23  Chief Complaint  Patient presents with   Centrilobular emphysema   Dyspnea:  MMRC1 = can walk nl pace, flat grade, can't hurry or go uphills or steps s sob   Cough: sensation of chest congestion creamy x 2017 when finished RT Sleep: level bed /one pillow s resp wakes up dry and thirty  SABA use: once or twice daily  02: none  Rec Pantoprazole  (protonix ) 40 mg   Take  30-60 min before first meal of the day and Pepcid  (famotidine )  20 mg after supper until return to office  Only use your albuterol  as a rescue medication  Also  Ok to try albuterol  15 min before an activity (on alternating days)  that you know would usually make you short of breath   For cough/ congestion >   Mucinex dm  up to maximum of  1200 mg every 12 hours and use the flutter valve as much as you can   Prednisone  10 mg take  4 each am x 2 days,   2 each am x 2 days,  1 each am x 2 days and stop      11/30/2023  f/u ov/[place] office/[name] re: GOLD 0/ emphysema  maint on no rx   Chief Complaint  Patient presents with   COPD   Shortness of Breath  Dyspnea:  able to do [employer] on day of ov slow pace  Cough: mostly yellowish just finished levaquin   Sleeping: on side one neck pillow s   resp cc  SABA use: avg  2-3 x per week hfa/ neb not at all  02: none  Rec My office will be contacting you by phone for referral to [facility] > neg w/u    Sinus CT 01/11/24  clear paranasal sinuses with patent sinus drainage pathways. 2.  Leftward nasal septal deviation.    07/11/2024  ACUTE ov/[place] office/[employer] re: cough x 2017  maint on saba/budesonide  and max gerd rx  did  bring meds  Chief Complaint  Patient presents with   Acute Visit    Pt c/o increased cough x 2 wks- prod with thick, dark green sputum.  She gets SOB when she gets into coughing spells.   Dyspnea:  walking in house ok  Cough: severe coughing fits despite h1, mucinex d/ max gerd rx / green mucus not better with abx Sleeping: flat bed one pillow bothered  stuffy nose      SABA use: not helpful  02: none  Rec Prednisone  10 mg take  4 each am x 2 days,   2 each am x 2 days,  1 each am x 2 days and stop   Gabapentin  100 mg one at bedtime for a week, then 1 with bfast    for a week, then add done at supper for a week then 1 at lunch for week and keep adding one pill a week  until cough is gone or reach 1200 mg per refill Take mucinex DM 1200 mg  every 12 hours and supplement if needed with  tramadol  50 mg up to 2 every 4 hours  Once you have eliminated the cough for 3 straight days try reducing the tramadol  first,  then the Mucinex dm  as tolerated.    Please schedule a follow up office visit in 6 weeks, call sooner if needed - bring flutter valve     08/16/2024  f/u ov/[place] office/[name] re: GOLD 0 COPD/ cc cough x 2017  maint on flutter valve  /mucinex dm  Xyzal in am and 1st gen H1 blockers x 2 at hs / could not tol gabapentn Chief Complaint  Patient presents with   Cough    Coughing is almost gone DOE  Dyspnea:  mostly house bound due to heat > walks dog when cool  Cough: minimal now / non productive/ using ACT  lozenges helped more than anything else but still lots of watery nasal secretions  Sleeping: bed is flat/one pillow some nasal stuffiness but no noct cough  SABA use: neb /bud q hs / then neb prn s bud  02: none    No obvious day to day or daytime variability or assoc  purulent sputum or mucus plugs or hemoptysis or cp or chest  tightness, subjective wheeze or overt  hb symptoms.    Also denies any obvious fluctuation of symptoms with weather or environmental changes or other aggravating or alleviating factors except as outlined above   No unusual exposure hx or h/o childhood pna/ asthma or knowledge of premature birth.  Current Allergies, Complete Past Medical History, Past Surgical History, Family History, and Social History were reviewed in [employer] record.  ROS  The following are not active complaints unless bolded Hoarseness, sore throat, dysphagia, dental problems, itching, sneezing,  nasal congestion or discharge of excess mucus or purulent secretions, ear ache,   fever, chills, sweats, unintended wt loss or wt gain, classically pleuritic or exertional cp,  orthopnea pnd or arm/hand swelling  or leg swelling, presyncope, palpitations, abdominal pain, anorexia, nausea, vomiting, diarrhea  or change in bowel habits or change in bladder habits, change in stools or change in urine, dysuria, hematuria,  rash, arthralgias, visual complaints, headache, numbness, weakness or ataxia or problems with walking or coordination,  change in mood or  memory.        Current Meds  Medication Sig   acetaminophen  (TYLENOL  8 HOUR ARTHRITIS PAIN) 650 MG CR tablet Take 650 mg by mouth every 8 (eight) hours as needed for pain.   albuterol  (PROVENTIL  HFA;VENTOLIN  HFA) 108 (90 Base) MCG/ACT inhaler Inhale 2 puffs into the lungs every 6 (six) hours as needed for wheezing or shortness of breath.   albuterol  (PROVENTIL ) (2.5 MG/3ML) 0.083% nebulizer solution Take 3 mLs (2.5 mg total) by nebulization every 4 (four) hours as needed.   aspirin EC 81 MG tablet Take 1 tablet by mouth daily.   budesonide  (PULMICORT ) 0.25 MG/2ML nebulizer solution Take 2 mLs (0.25 mg total) by nebulization 2 (two) times daily.   buPROPion (WELLBUTRIN XL) 300 MG 24 hr tablet Take 300 mg by mouth daily.   Chlorpheniramine Maleate  (CHLOR-TABLETS PO) Take by mouth as needed.   cloNIDine  (CATAPRES ) 0.1 MG tablet Take 1 tablet by mouth twice daily   Cyanocobalamin  5000 MCG TBDP Dissolve in the mouth.   famotidine  (PEPCID ) 20 MG tablet TAKE 1 TABLET BY MOUTH ONCE DAILY  AFTER  SUPPER   hydrochlorothiazide (HYDRODIURIL) 12.5 MG tablet Take 12.5 mg by mouth daily as needed (if blood pressure is greater than 128/82).   levocetirizine (XYZAL) 5 MG tablet Take 5 mg by mouth every evening.   losartan (COZAAR) 50 MG tablet Take 50 mg by mouth daily as needed (if blood pressure is greater than 128/82).   Melatonin 10 MG TABS Take 10 mg by mouth at bedtime.   metoprolol  tartrate (LOPRESSOR ) 25 MG tablet Take 0.5 tablets (12.5 mg total) by mouth 2 (two) times daily.   naproxen sodium (ALEVE) 220 MG tablet Take 220-440 mg by mouth 2 (two) times daily as needed (pain.).   oxymetazoline (AFRIN) 0.05 % nasal spray Place 1 spray into both nostrils 2 (two) times daily.        psyllium (REGULOID) 0.52 g capsule Take 0.52 g by mouth daily.   RABEprazole  (ACIPHEX ) 20 MG tablet TAKE 1 TABLET BY MOUTH 30 TO 60 MINUTES BEFORE FIRST MEAL OF THE DAY   rosuvastatin (CRESTOR) 10 MG tablet Take 10 mg by mouth at bedtime.   VEOZAH 45 MG TABS Take 1 tablet by mouth daily.   Vitamin D , Ergocalciferol , (DRISDOL ) 1.25 MG (50000 UNIT) CAPS capsule Take by mouth.              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      Objective:    Wts  08/16/2024        132  07/11/2024        132  05/17/2024        131  04/05/2024        129  03/21/2024        127  01/23/2024        120  11/30/2023       116   10/21/23 122 lb (55.3 kg)  09/12/23 122 lb  (55.3 kg)  08/10/23 128 lb (58.1 kg)    Vital signs reviewed  08/16/2024  - Note at rest 02 sats  97% on RA   General appearance:    amb wf cough on FVC only   Edent/watery nasal secretions   HEENT : Oropharynx  clear /edentulous     Nasal turbinates mod edema and thin watery secretions    NECK :  without  apparent JVD/ palpable Nodes/TM    LUNGS: no acc muscle use,  Nl contour chest which is clear to A and P bilaterally without cough on insp or exp maneuvers   CV:  RRR  no s3 or murmur or increase in P2, and no edema   ABD:  soft and nontender   MS:  Gait m;   ext warm without deformities Or obvious joint restrictions  calf tenderness, cyanosis or clubbing    SKIN: warm and dry without lesions    NEURO:  alert, approp, nl sensorium with  no motor or cerebellar deficits apparent.        Assessment   Assessment & Plan Upper airway cough syndrome Onset ? 2023 / worse on trelegy 100 > d/c'd 09/09/23 -  max gerd rx 09/12/2023 >>>  improved initially off trelegy until caught cold  -  referred to ENT 11/30/2023 >>> seen  01/02/24 c/w chronic sinusitis > CT sinus 01/11/24 1. Small bilateral maxillary sinus mucous retention cysts. Otherwise clear paranasal sinuses with patent sinus drainage pathways. 2. Leftward nasal septal deviation. Allergy screen 01/23/2024 > Eos 0.3 / IgE 15  -  07/11/2024 Prednisone  10 mg take  4 each am x 2 days,   2 each am x 2 days,  1 each am x 2 days and stop / Take delsym two tsp every 12 hours and supplement if needed with  tramadol  50 mg up to 2 every 4 hours to suppress the urge to cough. And start on gabapentin  titrated to as high as 1200 mg daily   >>>  08/16/2024  improved but still lots of nasal discharge > refer to allergy at pt's request (note prev allergy screen unremarkable ? Needs atrovent trial next?  In meantime rec Mucinex dm/ tramadol  to completely eliminate cyclical throat clearing and encouraged use of non-menthol non-mint lozenges/ hard  rock candy  see avs for instructions unique to this ov    COPD GOLD 0 Quit smoking 2017/ MM for alpha one  with dx of lung ca - PFT 02/22/23 >> FEV1 1.95 (96%), FEV1% 80, TLC 4.83 (108%), RV 2.54 (133%), DLCO 52%  worse p SABA and no curvature on inp/exp to indicate true airflow obstruction  - 09/12/2023  After extensive coaching inhaler device,  effectiveness =    80% with hfa so just rx as AB with albuterol  more appropriately  09/12/2023   Walked on RA  x  3  lap(s) =  approx 450  ft  @ mod pace, stopped due to end of study with lowest 02 sats 96%  p saba prior  - Allergy screen 01/23/2024 >  Eos 0.3 /  IgE  15  alpha one phenotype  MM / evel 146  - 03/21/2024 change rx to alb/bud 0.25 mg bid as main cc = cough either due to AB or UACS  - 04/05/2024  After extensive coaching inhaler device,  effectiveness =    75% (short ti)  - 04/05/2024 flutter valve training  - 05/17/2024   Walked on RA  x  2  lap(s) =  approx 450  ft  @ mod pace, stopped due to end of sutdy  with lowest 02 sats 95% and mild sob    Did not understand last instruction re use of bud 0.25 mg which should be bid and then saba prn    Rhinitis, chronic with assoc globus/VCD 03/21/2024 started afrin/ flonase regimen p neg ent eval  03/01/24  - trial of dymista / 1st gen H1 blockers per guidelines  and prn Mucinex D > some better 08/16/2024 but still watery discharge so rec allergy eval ? Atrovent trial next?          Each maintenance medication was reviewed in detail including emphasizing most importantly the difference between maintenance and prns and under what circumstances the prns are to be triggered using an action plan format where appropriate.  Total time for H and P, chart review, counseling, reviewing neb/ nasal  device(s) and generating customized AVS unique to this office visit / same day charting = 40 min   for multiple  refractory respiratory  symptoms of uncertain etiology           Patient Instructions  Albuterol   with budesonide  twice daily as previous recommended   For cough/ congestion >  mucinex dm  up to maximum of  1200 mg every 12 hours and use the flutter valve as much as you can  - ok to use tramadol  50 mg  as before x 5 days with goal of no coughing at all while on tramadol  then stop it   My office will be contacting you by phone for referral to allergy   - if you don't hear back from my office within one week please call us  back or notify us  thru MyChart and we'll address it right away.    Pulmonary follow up is as needed once you have finished with allergy    Ferrin Liebig, MD 08/18/2024

## 2024-08-16 ENCOUNTER — Ambulatory Visit (INDEPENDENT_AMBULATORY_CARE_PROVIDER_SITE_OTHER): Admitting: Internal Medicine

## 2024-08-16 ENCOUNTER — Encounter: Payer: Self-pay | Admitting: Internal Medicine

## 2024-08-16 VITALS — BP 121/79 | HR 90 | Ht 60.0 in | Wt 132.6 lb

## 2024-08-16 DIAGNOSIS — F17210 Nicotine dependence, cigarettes, uncomplicated: Secondary | ICD-10-CM

## 2024-08-16 DIAGNOSIS — R058 Other specified cough: Secondary | ICD-10-CM | POA: Diagnosis not present

## 2024-08-16 DIAGNOSIS — J310 Chronic rhinitis: Secondary | ICD-10-CM

## 2024-08-16 DIAGNOSIS — J449 Chronic obstructive pulmonary disease, unspecified: Secondary | ICD-10-CM | POA: Diagnosis not present

## 2024-08-16 MED ORDER — TRAMADOL HCL 50 MG PO TABS
50.0000 mg | ORAL_TABLET | ORAL | 0 refills | Status: AC | PRN
Start: 2024-08-16 — End: 2024-08-28

## 2024-08-16 NOTE — Patient Instructions (Addendum)
 Albuterol  with budesonide  twice daily as previous recommended   For cough/ congestion >   mucinex dm  up to maximum of  1200 mg every 12 hours and use the flutter valve as much as you can  - ok to use tramadol  50 mg  as before x 5 days with goal of no coughing at all while on tramadol  then stop it   My office will be contacting you by phone for referral to allergy   - if you don't hear back from my office within one week please call us  back or notify us  thru MyChart and we'll address it right away.    Pulmonary follow up is as needed once you have finished with allergy

## 2024-08-17 ENCOUNTER — Other Ambulatory Visit: Payer: Self-pay | Admitting: Internal Medicine

## 2024-08-18 NOTE — Assessment & Plan Note (Addendum)
 Quit smoking 2017/ MM for alpha one  with dx of lung ca - PFT 02/22/23 >> FEV1 1.95 (96%), FEV1% 80, TLC 4.83 (108%), RV 2.54 (133%), DLCO 52%  worse p SABA and no curvature on inp/exp to indicate true airflow obstruction  - 09/12/2023  After extensive coaching inhaler device,  effectiveness =    80% with hfa so just rx as AB with albuterol  more appropriately  09/12/2023   Walked on RA  x  3  lap(s) =  approx 450  ft  @ mod pace, stopped due to end of study with lowest 02 sats 96%  p saba prior  - Allergy screen 01/23/2024 >  Eos 0.3 /  IgE  15  alpha one phenotype  MM / evel 146  - 03/21/2024 change rx to alb/bud 0.25 mg bid as main cc = cough either due to AB or UACS  - 04/05/2024  After extensive coaching inhaler device,  effectiveness =    75% (short ti)  - 04/05/2024 flutter valve training  - 05/17/2024   Walked on RA  x  2  lap(s) =  approx 450  ft  @ mod pace, stopped due to end of sutdy  with lowest 02 sats 95% and mild sob    Did not understand last instruction re use of bud 0.25 mg which should be bid and then saba prn

## 2024-08-18 NOTE — Assessment & Plan Note (Addendum)
 03/21/2024 started afrin/ flonase regimen p neg ent eval  03/01/24  - trial of dymista / 1st gen H1 blockers per guidelines  and prn Mucinex D > some better 08/16/2024 but still watery discharge so rec allergy eval ? Atrovent trial next?          Each maintenance medication was reviewed in detail including emphasizing most importantly the difference between maintenance and prns and under what circumstances the prns are to be triggered using an action plan format where appropriate.  Total time for H and P, chart review, counseling, reviewing neb/ nasal  device(s) and generating customized AVS unique to this office visit / same day charting = 40 min   for multiple  refractory respiratory  symptoms of uncertain etiology

## 2024-08-18 NOTE — Assessment & Plan Note (Addendum)
 Onset ? 2023 / worse on trelegy 100 > d/c'd 09/09/23 -  max gerd rx 09/12/2023 >>> improved initially off trelegy until caught cold  -  referred to ENT 11/30/2023 >>> seen  01/02/24 c/w chronic sinusitis > CT sinus 01/11/24 1. Small bilateral maxillary sinus mucous retention cysts. Otherwise clear paranasal sinuses with patent sinus drainage pathways. 2. Leftward nasal septal deviation. Allergy screen 01/23/2024 > Eos 0.3 / IgE 15  -  07/11/2024 Prednisone  10 mg take  4 each am x 2 days,   2 each am x 2 days,  1 each am x 2 days and stop / Take delsym two tsp every 12 hours and supplement if needed with  tramadol  50 mg up to 2 every 4 hours to suppress the urge to cough. And start on gabapentin  titrated to as high as 1200 mg daily   >>>  08/16/2024  improved but still lots of nasal discharge > refer to allergy at pt's request (note prev allergy screen unremarkable ? Needs atrovent trial next?  In meantime rec Mucinex dm/ tramadol  to completely eliminate cyclical throat clearing and encouraged use of non-menthol non-mint lozenges/ hard rock candy  see avs for instructions unique to this ov

## 2024-08-20 ENCOUNTER — Inpatient Hospital Stay: Payer: 59 | Attending: Oncology

## 2024-08-20 ENCOUNTER — Ambulatory Visit (HOSPITAL_COMMUNITY)
Admission: RE | Admit: 2024-08-20 | Discharge: 2024-08-20 | Disposition: A | Discharge status: Home / Self Care | Payer: 59 | Source: Ambulatory Visit | Attending: Hematology | Admitting: Hematology

## 2024-08-20 DIAGNOSIS — C3411 Malignant neoplasm of upper lobe, right bronchus or lung: Secondary | ICD-10-CM | POA: Insufficient documentation

## 2024-08-20 DIAGNOSIS — Z85118 Personal history of other malignant neoplasm of bronchus and lung: Secondary | ICD-10-CM | POA: Insufficient documentation

## 2024-08-20 DIAGNOSIS — C3490 Malignant neoplasm of unspecified part of unspecified bronchus or lung: Secondary | ICD-10-CM | POA: Diagnosis not present

## 2024-08-20 DIAGNOSIS — Z23 Encounter for immunization: Secondary | ICD-10-CM | POA: Diagnosis not present

## 2024-08-20 DIAGNOSIS — Z87891 Personal history of nicotine dependence: Secondary | ICD-10-CM | POA: Diagnosis not present

## 2024-08-20 DIAGNOSIS — J432 Centrilobular emphysema: Secondary | ICD-10-CM | POA: Diagnosis not present

## 2024-08-20 DIAGNOSIS — E559 Vitamin D deficiency, unspecified: Secondary | ICD-10-CM | POA: Insufficient documentation

## 2024-08-20 DIAGNOSIS — Z9221 Personal history of antineoplastic chemotherapy: Secondary | ICD-10-CM | POA: Insufficient documentation

## 2024-08-20 DIAGNOSIS — D509 Iron deficiency anemia, unspecified: Secondary | ICD-10-CM | POA: Diagnosis not present

## 2024-08-20 DIAGNOSIS — I2510 Atherosclerotic heart disease of native coronary artery without angina pectoris: Secondary | ICD-10-CM | POA: Diagnosis not present

## 2024-08-20 LAB — CBC WITH DIFFERENTIAL/PLATELET
Abs Immature Granulocytes: 0.03 K/uL (ref 0.00–0.07)
Basophils Absolute: 0.1 K/uL (ref 0.0–0.1)
Basophils Relative: 1 %
Eosinophils Absolute: 0.3 K/uL (ref 0.0–0.5)
Eosinophils Relative: 4 %
HCT: 35.6 % — ABNORMAL LOW (ref 36.0–46.0)
Hemoglobin: 11.8 g/dL — ABNORMAL LOW (ref 12.0–15.0)
Immature Granulocytes: 1 %
Lymphocytes Relative: 20 %
Lymphs Abs: 1.2 K/uL (ref 0.7–4.0)
MCH: 33.5 pg (ref 26.0–34.0)
MCHC: 33.1 g/dL (ref 30.0–36.0)
MCV: 101.1 fL — ABNORMAL HIGH (ref 80.0–100.0)
Monocytes Absolute: 0.5 K/uL (ref 0.1–1.0)
Monocytes Relative: 8 %
Neutro Abs: 4.1 K/uL (ref 1.7–7.7)
Neutrophils Relative %: 66 %
Platelets: 280 K/uL (ref 150–400)
RBC: 3.52 MIL/uL — ABNORMAL LOW (ref 3.87–5.11)
RDW: 12.6 % (ref 11.5–15.5)
WBC: 6.2 K/uL (ref 4.0–10.5)
nRBC: 0 % (ref 0.0–0.2)

## 2024-08-20 LAB — FERRITIN: Ferritin: 60 ng/mL (ref 11–307)

## 2024-08-20 LAB — IRON AND TIBC
Iron: 98 ug/dL (ref 28–170)
Saturation Ratios: 26 % (ref 10.4–31.8)
TIBC: 375 ug/dL (ref 250–450)
UIBC: 277 ug/dL

## 2024-08-20 LAB — COMPREHENSIVE METABOLIC PANEL WITH GFR
ALT: 10 U/L (ref 0–44)
AST: 14 U/L — ABNORMAL LOW (ref 15–41)
Albumin: 3.8 g/dL (ref 3.5–5.0)
Alkaline Phosphatase: 104 U/L (ref 38–126)
Anion gap: 13 (ref 5–15)
BUN: 18 mg/dL (ref 8–23)
CO2: 24 mmol/L (ref 22–32)
Calcium: 9.1 mg/dL (ref 8.9–10.3)
Chloride: 101 mmol/L (ref 98–111)
Creatinine, Ser: 1.21 mg/dL — ABNORMAL HIGH (ref 0.44–1.00)
GFR, Estimated: 49 mL/min — ABNORMAL LOW (ref >60)
Glucose, Bld: 88 mg/dL (ref 70–99)
Potassium: 3.9 mmol/L (ref 3.5–5.1)
Sodium: 138 mmol/L (ref 135–145)
Total Bilirubin: 0.8 mg/dL (ref 0.0–1.2)
Total Protein: 6.7 g/dL (ref 6.5–8.1)

## 2024-08-20 LAB — VITAMIN B12: Vitamin B-12: >7500 pg/mL — ABNORMAL HIGH (ref 180–914)

## 2024-08-20 LAB — FOLATE: Folate: 10.5 ng/mL (ref >5.9)

## 2024-08-21 ENCOUNTER — Other Ambulatory Visit: Payer: Self-pay | Admitting: *Deleted

## 2024-08-21 ENCOUNTER — Ambulatory Visit: Payer: Self-pay | Admitting: Oncology

## 2024-08-21 NOTE — Progress Notes (Signed)
 Can you tell her to stop taking B12 please.  Delon Hope, NP 08/21/2024 8:03 AM

## 2024-08-21 NOTE — Progress Notes (Signed)
 B12 elevated.  Per Delon Hope, NP patient advised to stop taking.  Verbalized understanding.

## 2024-08-28 ENCOUNTER — Inpatient Hospital Stay: Payer: 59 | Attending: Hematology | Admitting: Oncology

## 2024-08-28 ENCOUNTER — Telehealth: Payer: Self-pay | Admitting: Internal Medicine

## 2024-08-28 VITALS — BP 139/88 | HR 73 | Temp 98.0°F | Resp 20 | Wt 132.1 lb

## 2024-08-28 DIAGNOSIS — Z85118 Personal history of other malignant neoplasm of bronchus and lung: Secondary | ICD-10-CM | POA: Diagnosis not present

## 2024-08-28 DIAGNOSIS — Z923 Personal history of irradiation: Secondary | ICD-10-CM | POA: Insufficient documentation

## 2024-08-28 DIAGNOSIS — D509 Iron deficiency anemia, unspecified: Secondary | ICD-10-CM | POA: Insufficient documentation

## 2024-08-28 DIAGNOSIS — C3411 Malignant neoplasm of upper lobe, right bronchus or lung: Secondary | ICD-10-CM

## 2024-08-28 DIAGNOSIS — Z9221 Personal history of antineoplastic chemotherapy: Secondary | ICD-10-CM | POA: Insufficient documentation

## 2024-08-28 DIAGNOSIS — R140 Abdominal distension (gaseous): Secondary | ICD-10-CM

## 2024-08-28 DIAGNOSIS — D649 Anemia, unspecified: Secondary | ICD-10-CM | POA: Insufficient documentation

## 2024-08-28 NOTE — Assessment & Plan Note (Addendum)
-   Denies any recent infections. - Reviewed CT chest (08/20/2024): New 3 mm right middle lobe nodule.  Additional areas of nodularity in the lungs are stable.  Recommend continued follow-up.  Postradiation scarring bilaterally.  Emphysema. - Labs from 08/20/2024: Normal LFTs.  Creatinine mildly elevated at 1.21.  CBC shows microcytic anemia.  Add on nutritional labs show B12 level of greater than 7500, folate normal and iron panel with iron sat 26% normal TIBC and ferritin of 60. - Recommend follow-up in 3 months with repeat CT scan as well as routine labs.

## 2024-08-28 NOTE — Telephone Encounter (Signed)
 Copied from CRM [id number]. Topic: Referral - Status >> Aug 28, 2024  2:48 PM Emily Pope wrote: Reason for CRM: Patient is calling to request an update on Allergy referral. Please update patient.

## 2024-08-28 NOTE — Assessment & Plan Note (Addendum)
 Multifactorial.  Most recent lab work from 08/20/2024 shows elevated B12 levels, normal folate levels and overall stable iron levels although ferritin is less than 100.  She is currently not taking iron supplements.  She was instructed to stop B12 supplements.  Recheck in 3 months.

## 2024-08-28 NOTE — Progress Notes (Signed)
 [facility] OFFICE PROGRESS NOTE  Rosamond Leta NOVAK, MD  ASSESSMENT & PLAN:    Assessment & Plan Primary cancer of right upper lobe of lung (HCC) - Denies any recent infections. - Reviewed CT chest (08/20/2024): New 3 mm right middle lobe nodule.  Additional areas of nodularity in the lungs are stable.  Recommend continued follow-up.  Postradiation scarring bilaterally.  Emphysema. - Labs from 08/20/2024: Normal LFTs.  Creatinine mildly elevated at 1.21.  CBC shows microcytic anemia.  Add on nutritional labs show B12 level of greater than 7500, folate normal and iron panel with iron sat 26% normal TIBC and ferritin of 60. - Recommend follow-up in 3 months with repeat CT scan as well as routine labs.   Anemia, unspecified type Multifactorial.  Most recent lab work from 08/20/2024 shows elevated B12 levels, normal folate levels and overall stable iron levels although ferritin is less than 100.  She is currently not taking iron supplements.  She was instructed to stop B12 supplements.  Recheck in 3 months. Abdominal bloating We discussed following up with GI if this is persistent.  Discussed it could be GERD but it also could be some sort of stricture.  We did review most recent CT scan which did not reveal any lymph nodes in her upper abdomen or abnormality.  She likely will need an upper EGD for evaluation.  Orders Placed This Encounter  Procedures   CT Chest Wo Contrast    Standing Status:   Future    Expected Date:   11/27/2024    Expiration Date:   08/28/2025    Preferred imaging location?:   [facility]   Iron and TIBC ([facility] ONLY)    Standing Status:   Future    Expected Date:   11/27/2024    Expiration Date:   02/25/2025   Vitamin B12    Standing Status:   Future    Expected Date:   11/27/2024    Expiration Date:   02/25/2025   Ferritin    Standing Status:   Future    Expected Date:   11/27/2024    Expiration Date:   02/25/2025   Comprehensive  metabolic panel    Standing Status:   Future    Expected Date:   11/27/2024    Expiration Date:   02/25/2025   CBC with Differential    Standing Status:   Future    Expected Date:   11/27/2024    Expiration Date:   02/25/2025    INTERVAL HISTORY: Patient returns for follow-up for history of lung cancer.  Recently had imaging and is here to review most recent CT scan.  Denies any interval hospitalizations, surgeries or changes to her baseline health.  She is followed closely by pulmonology Dr. [name] and saw them last on 08/16/2024.  Reports her breathing has worsened over the past 6 months or so.  States she was able to walk down 13 stairs where her laundry is and is now having to rest at the bottom before coming back up.  Reports her oxygen  saturations have been okay.  She continues to cough but this is better.  She is having some upper abdominal discomfort and was evaluated by her PCP but it does not look like anything was ordered for this.  She does have a gastroenterologist but she has not seen them in many years.  She has been trying to eat smaller meals which has helped some.  We reviewed folate,  iron panel, ferritin, B12, CMP and CBC.  SUMMARY OF HEMATOLOGIC HISTORY: Oncology History Overview Note  Stage IIIB (T4 N3 M0) carcinoma of RUL of lung, immunophenotyping consistent with adenocarcinoma.  S/P concomitant chemoradiation with carboplatin /paclitaxel  (09/20/2016- 10/25/2016) followed by 2 cycle of consolidative chemotherapy with carboplatin /paclitaxel  (11/15/2016- 12/06/2016).  Unable to tolerate consolidative durvalumab  (Imfinzi ) beginning on 01/26/2016 and stopped 05/2017.   AND Imfinzi -induced pneumonitis, grade 2, resulting in holding Imfinzi  from 04/25/2017- 05/09/2017 with corticosteroids treatment with taper.  This resolved/improved with steroids. Developed pnemonitis again after rechallenge with imfinzi . On steroid taper again.     Primary cancer of right upper lobe of lung (HCC)   08/04/2016 PET scan   PET IMPRESSION: 1. 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 Mass is confluent with the right superior hilum and right lower tracheal wall, suggestive of a T4 primary tumor. 2. Postobstructive pneumonia in the right upper lobe. 3. Hypermetabolic ipsilateral and contralateral mediastinal and contralateral hilar lymphadenopathy, suggestive of N3 nodal disease.   08/24/2016 Procedure   Bronchoscopy   08/25/2016 Pathology Results   Diagnosis Endobronchial biopsy, RUL - POORLY DIFFERENTIATED NON-SMALL CELL CARCINOMA. The immunophenotype is consistent with poorly differentiated adenocarcinoma.   09/10/2016 Imaging   MRI brain- No metastatic disease or acute intracranial abnormality.   09/20/2016 - 10/25/2016 Chemotherapy   Carboplatin /Paclitaxel  weekly x 6 with XRT.   09/20/2016 - 10/25/2016 Radiation Therapy     11/15/2016 - 12/06/2016 Chemotherapy   Carboplatin /paclitaxel  every 21 days x 2 cycles.   12/30/2016 Imaging   CT CAP- 1. Central right upper lobe lung mass has mildly decreased since 11/21/2016 chest CT angiogram and significantly decreased since 08/04/2016 PET-CT. 2. No pathologically enlarged thoracic lymph nodes. Previously described hypermetabolic thoracic adenopathy on the 08/04/2016 PET-CT has decreased in size. 3. Previously described bilateral lower lobe pulmonary nodules are stable in size. 4. New solitary subsolid 4 mm right middle lobe pulmonary nodule is nonspecific, and may be inflammatory. Recommend attention on a follow-up chest CT in 3 months. 5. No definite metastatic disease in the abdomen. Tiny sub 5 mm low-attenuation lesions in the liver are too small to characterize, favor benign. 6. Aortic atherosclerosis.  One vessel coronary atherosclerosis. 7. Moderate emphysema.   01/25/2017 -   Chemotherapy   Durvalumab  (Imfinzi ) x 52 weeks.    03/31/2017 Imaging   CT chest with contrast: Stable size of partially calcified mass in the posterior right upper lobe.   Increased airspace disease in paramediastinal right upper and superior right lower lobes. Differential diagnosis includes radiation pneumonitis, drug reaction, and infection.   Stable sub-cm right lower lobe pulmonary nodule.   No evidence of lymphadenopathy or pleural effusion.   Emphysema.   Aortic and coronary artery atherosclerosis.   04/25/2017 Adverse Reaction   Progressive cough, concerning for pneumonitis.   04/25/2017 Treatment Plan Change   Progressive cough concerning for pneumonitis.  Treatment held.  Managed by steroids.   05/09/2017 Treatment Plan Change   Restart Imfinzi  with improvement of cough, Grade 1.   05/27/2017 Imaging   CT CHEST WITH CONTRAST IMPRESSION: 1. Similar size of partially calcified posterior right apical lung mass. 2. Similar to minimal increase in surrounding radiation fibrosis. 3. Subtle areas of mosaic attenuation are greater on the right. Likely attributed to mild ground-glass opacity. In the appropriate clinical setting, this could represent drug toxicity induced pneumonitis. 4.  Coronary artery atherosclerosis. Aortic atherosclerosis. 5. Similar  right lower lobe pulmonary nodule.   05/27/2017 Adverse Reaction   Developed worsening cough and SOB again after imfinzi . Grade 2. Placed on long steroid taper.  Permanently discontinue any further imfinzi  treatments.   07/29/2017 Imaging   CT chest/abd/pelvis: IMPRESSION: 1. Stable post treatment related changes of mass-like radiation fibrosis in the upper right lung with similar appearance of densely calcified mass in the apex of the right upper lobe. No finding to suggest local recurrence of disease or metastatic disease in the chest, abdomen or pelvis on today's examination. 2. 7 mm subpleural nodule in the periphery  of the right lower lobe is stable and favored to be benign. Continued attention on followup studies is recommended to ensure stability. 3. Aortic atherosclerosis, in addition to left anterior descending coronary artery disease. Please note that although the presence of coronary artery calcium documents the presence of coronary artery disease, the severity of this disease and any potential stenosis cannot be assessed on this non-gated CT examination. Assessment for potential risk factor modification, dietary therapy or pharmacologic therapy may be warranted, if clinically indicated. 4. There are calcifications of the mitral valve. Echocardiographic correlation for evaluation of potential valvular dysfunction may be warranted if clinically indicated. 5. Mild diffuse bronchial wall thickening with mild to moderate centrilobular and mild paraseptal emphysema; imaging findings suggestive of underlying COPD. 6. Tip of left subclavian Port-A-Cath is now misdirected into the subclavian vein.    -Diagnosed in August 2017, status post combination chemoradiation therapy and carboplatin  and paclitaxel  from 09/20/2016 through 10/25/2016, followed by 2 cycles of consolidative carboplatin /paclitaxel  from 11/15/2016 through 12/06/2016. -Durvalumab  consolidation from 01/25/2017, discontinued on 05/09/2017 secondary to pneumonitis. -PET scan on 11/20/2018 showed further reduction in activity associated with the right apical consolidation, likely treatment related, without current evidence of malignancy.  Chronically stable 7 mm nodule in the right lateral costophrenic angle without current hypermetabolic activity. -CT of the chest on 06/01/2019 which showed treatment effect with the right upper lobe with no evidence of local recurrence or residual disease. -CT chest 12/11/2019 showed no changes in right apical consolidation.  A new 3 mm left upper lobe nodule is new since the prior exam.  If new left upper lobe  nodule has increased in size this would warrant a PET CT followed by possible biopsy if FDG avid. -CT of the chest done on 06/09/2020 showed stable appearance of posttreatment changes involving the right upper lobe with compensatory hyperinflation of the right middle lobe and right lower lobe.  Stable subpleural nodules in the right lower lobe and 3 mm left upper lobe lung nodule.  Previously noted 5 mm  peribronchovascular nodule now measures 3 mm and is likely postinflammatory. -CT chest on 01/22/2021 at [facility] showed new bilateral lower lobe airspace opacities concerning for pneumonia.  Trace bilateral effusions.  No evidence of pulmonary embolism.  Postradiation changes in the right upper lobe, unchanged.  CBC    Component Value Date/Time   WBC 6.2 08/20/2024 0740   RBC 3.52 (L) 08/20/2024 0740   HGB 11.8 (L) 08/20/2024 0740   HGB 11.7 01/23/2024 1023   HGB 12.9 09/02/2016 1403   HCT 35.6 (L) 08/20/2024 0740   HCT 34.8 01/23/2024 1023   HCT 38.4 09/02/2016 1403   PLT 280 08/20/2024 0740   PLT 401 01/23/2024 1023   MCV 101.1 (H) 08/20/2024 0740   MCV 102 (H) 01/23/2024 1023   MCV 94.6 09/02/2016 1403   MCH 33.5 08/20/2024 0740   MCHC 33.1 08/20/2024 0740  RDW 12.6 08/20/2024 0740   RDW 12.3 01/23/2024 1023   RDW 13.6 09/02/2016 1403   LYMPHSABS 1.2 08/20/2024 0740   LYMPHSABS 1.2 01/23/2024 1023   LYMPHSABS 2.5 09/02/2016 1403   MONOABS 0.5 08/20/2024 0740   MONOABS 0.7 09/02/2016 1403   EOSABS 0.3 08/20/2024 0740   EOSABS 0.3 01/23/2024 1023   BASOSABS 0.1 08/20/2024 0740   BASOSABS 0.1 01/23/2024 1023   BASOSABS 0.1 09/02/2016 1403       Latest Ref Rng & Units 08/20/2024    7:40 AM 02/10/2024    9:17 AM 02/10/2024    8:56 AM  CMP  Glucose 70 - 99 mg/dL 88   890   BUN 8 - 23 mg/dL 18   19   Creatinine 9.55 - 1.00 mg/dL 8.78  8.79  8.82   Sodium 135 - 145 mmol/L 138   140   Potassium 3.5 - 5.1 mmol/L 3.9   3.8   Chloride 98 - 111 mmol/L 101   102   CO2 22 - 32 mmol/L 24    26   Calcium 8.9 - 10.3 mg/dL 9.1   9.7   Total Protein 6.5 - 8.1 g/dL 6.7   7.6   Total Bilirubin 0.0 - 1.2 mg/dL 0.8   0.5   Alkaline Phos 38 - 126 U/L 104   95   AST 15 - 41 U/L 14   14   ALT 0 - 44 U/L 10   13      Lab Results  Component Value Date   FERRITIN 60 08/20/2024   VITAMINB12 >7,500 (H) 08/20/2024    Vitals:   08/28/24 1057  BP: 139/88  Pulse: 73  Resp: 20  Temp: 98 F (36.7 C)  SpO2: 99%    Review of System:  Review of Systems  Constitutional:  Positive for malaise/fatigue.  Respiratory:  Positive for shortness of breath.   Neurological:  Positive for headaches.  Psychiatric/Behavioral:  The patient has insomnia.     Physical Exam: Physical Exam Constitutional:      Appearance: Normal appearance. She is obese.  HENT:     Head: Normocephalic and atraumatic.  Eyes:     Pupils: Pupils are equal, round, and reactive to light.  Cardiovascular:     Rate and Rhythm: Normal rate and regular rhythm.     Heart sounds: Normal heart sounds. No murmur heard. Pulmonary:     Effort: Pulmonary effort is normal.     Breath sounds: Normal breath sounds. No wheezing.  Abdominal:     General: Bowel sounds are normal. There is no distension.     Palpations: Abdomen is soft.     Tenderness: There is no abdominal tenderness.  Musculoskeletal:        General: Normal range of motion.     Cervical back: Normal range of motion.  Skin:    General: Skin is warm and dry.     Findings: No rash.  Neurological:     Mental Status: She is alert and oriented to person, place, and time.     Gait: Gait is intact.  Psychiatric:        Mood and Affect: Mood and affect normal.        Cognition and Memory: Memory normal.        Judgment: Judgment normal.      I spent 20 minutes dedicated to the care of this patient (face-to-face and non-face-to-face) on the date of the encounter to include what is  described in the assessment and plan.,  Delon Hope, NP 08/28/2024 12:30  PM

## 2024-08-29 NOTE — Telephone Encounter (Signed)
 Called and spoke with the patient, advised referral is pending and that the office will contact the patient regarding Allergy referral. nfn

## 2024-09-10 ENCOUNTER — Other Ambulatory Visit: Payer: Self-pay | Admitting: Internal Medicine

## 2024-09-11 NOTE — Telephone Encounter (Signed)
 PT WILL NEED APPT OR TO FOLLOW UP WITH PCP FOR FURTHER REFILLS

## 2024-09-14 ENCOUNTER — Other Ambulatory Visit: Payer: Self-pay | Admitting: Internal Medicine

## 2024-09-20 DIAGNOSIS — R52 Pain, unspecified: Secondary | ICD-10-CM | POA: Diagnosis not present

## 2024-09-20 DIAGNOSIS — I10 Essential (primary) hypertension: Secondary | ICD-10-CM | POA: Diagnosis not present

## 2024-09-20 DIAGNOSIS — Z299 Encounter for prophylactic measures, unspecified: Secondary | ICD-10-CM | POA: Diagnosis not present

## 2024-09-20 DIAGNOSIS — G8929 Other chronic pain: Secondary | ICD-10-CM | POA: Diagnosis not present

## 2024-09-20 DIAGNOSIS — J449 Chronic obstructive pulmonary disease, unspecified: Secondary | ICD-10-CM | POA: Diagnosis not present

## 2024-09-20 DIAGNOSIS — M542 Cervicalgia: Secondary | ICD-10-CM | POA: Diagnosis not present

## 2024-09-21 DIAGNOSIS — Z9221 Personal history of antineoplastic chemotherapy: Secondary | ICD-10-CM | POA: Diagnosis not present

## 2024-09-21 DIAGNOSIS — R0789 Other chest pain: Secondary | ICD-10-CM | POA: Diagnosis not present

## 2024-09-21 DIAGNOSIS — Z87891 Personal history of nicotine dependence: Secondary | ICD-10-CM | POA: Diagnosis not present

## 2024-09-21 DIAGNOSIS — R059 Cough, unspecified: Secondary | ICD-10-CM | POA: Diagnosis not present

## 2024-09-21 DIAGNOSIS — R051 Acute cough: Secondary | ICD-10-CM | POA: Diagnosis not present

## 2024-09-21 DIAGNOSIS — R918 Other nonspecific abnormal finding of lung field: Secondary | ICD-10-CM | POA: Diagnosis not present

## 2024-09-21 DIAGNOSIS — Z923 Personal history of irradiation: Secondary | ICD-10-CM | POA: Diagnosis not present

## 2024-09-21 DIAGNOSIS — R0781 Pleurodynia: Secondary | ICD-10-CM | POA: Diagnosis not present

## 2024-09-21 DIAGNOSIS — Z85118 Personal history of other malignant neoplasm of bronchus and lung: Secondary | ICD-10-CM | POA: Diagnosis not present

## 2024-09-21 DIAGNOSIS — J449 Chronic obstructive pulmonary disease, unspecified: Secondary | ICD-10-CM | POA: Diagnosis not present

## 2024-09-21 DIAGNOSIS — R0602 Shortness of breath: Secondary | ICD-10-CM | POA: Diagnosis not present

## 2024-09-21 DIAGNOSIS — Z79899 Other long term (current) drug therapy: Secondary | ICD-10-CM | POA: Diagnosis not present

## 2024-09-21 DIAGNOSIS — I10 Essential (primary) hypertension: Secondary | ICD-10-CM | POA: Diagnosis not present

## 2024-09-21 DIAGNOSIS — R079 Chest pain, unspecified: Secondary | ICD-10-CM | POA: Diagnosis not present

## 2024-09-21 DIAGNOSIS — J90 Pleural effusion, not elsewhere classified: Secondary | ICD-10-CM | POA: Diagnosis not present

## 2024-09-26 DIAGNOSIS — I10 Essential (primary) hypertension: Secondary | ICD-10-CM | POA: Diagnosis not present

## 2024-09-26 DIAGNOSIS — Z299 Encounter for prophylactic measures, unspecified: Secondary | ICD-10-CM | POA: Diagnosis not present

## 2024-09-26 DIAGNOSIS — C3411 Malignant neoplasm of upper lobe, right bronchus or lung: Secondary | ICD-10-CM | POA: Diagnosis not present

## 2024-09-26 DIAGNOSIS — N183 Chronic kidney disease, stage 3 unspecified: Secondary | ICD-10-CM | POA: Diagnosis not present

## 2024-09-26 DIAGNOSIS — R52 Pain, unspecified: Secondary | ICD-10-CM | POA: Diagnosis not present

## 2024-09-26 DIAGNOSIS — J449 Chronic obstructive pulmonary disease, unspecified: Secondary | ICD-10-CM | POA: Diagnosis not present

## 2024-10-03 DIAGNOSIS — R52 Pain, unspecified: Secondary | ICD-10-CM | POA: Diagnosis not present

## 2024-10-03 DIAGNOSIS — M542 Cervicalgia: Secondary | ICD-10-CM | POA: Diagnosis not present

## 2024-10-03 DIAGNOSIS — I10 Essential (primary) hypertension: Secondary | ICD-10-CM | POA: Diagnosis not present

## 2024-10-03 DIAGNOSIS — M4802 Spinal stenosis, cervical region: Secondary | ICD-10-CM | POA: Diagnosis not present

## 2024-10-03 DIAGNOSIS — M5440 Lumbago with sciatica, unspecified side: Secondary | ICD-10-CM | POA: Diagnosis not present

## 2024-10-03 DIAGNOSIS — M549 Dorsalgia, unspecified: Secondary | ICD-10-CM | POA: Diagnosis not present

## 2024-10-03 DIAGNOSIS — M47812 Spondylosis without myelopathy or radiculopathy, cervical region: Secondary | ICD-10-CM | POA: Diagnosis not present

## 2024-10-03 DIAGNOSIS — Z299 Encounter for prophylactic measures, unspecified: Secondary | ICD-10-CM | POA: Diagnosis not present

## 2024-10-07 ENCOUNTER — Other Ambulatory Visit: Payer: Self-pay | Admitting: Internal Medicine

## 2024-10-08 ENCOUNTER — Other Ambulatory Visit: Payer: Self-pay | Admitting: Internal Medicine

## 2024-10-12 ENCOUNTER — Other Ambulatory Visit: Payer: Self-pay | Admitting: Internal Medicine

## 2024-10-12 ENCOUNTER — Ambulatory Visit: Admitting: Allergy & Immunology

## 2024-10-12 ENCOUNTER — Encounter: Payer: Self-pay | Admitting: Allergy & Immunology

## 2024-10-12 VITALS — BP 112/74 | HR 82 | Temp 98.1°F | Ht 59.25 in | Wt 136.4 lb

## 2024-10-12 DIAGNOSIS — J449 Chronic obstructive pulmonary disease, unspecified: Secondary | ICD-10-CM

## 2024-10-12 DIAGNOSIS — J310 Chronic rhinitis: Secondary | ICD-10-CM | POA: Diagnosis not present

## 2024-10-12 MED ORDER — IPRATROPIUM BROMIDE 0.03 % NA SOLN
2.0000 | Freq: Three times a day (TID) | NASAL | 5 refills | Status: DC
Start: 2024-10-12 — End: 2024-10-19

## 2024-10-12 NOTE — Telephone Encounter (Signed)
 Pt requesting refill of Pepcid , not specified in LOV notes whether to continue or d/c.  Please advise, thank you!

## 2024-10-12 NOTE — Patient Instructions (Addendum)
 1. COPD GOLD 0 - Lung testing looked really good today, all things considered.  - Dr. [name] is doing a great job with your breathing.  2. Chronic rhinitis - Because of insurance stipulations, we cannot do skin testing on the same day as your first visit. - We are all working to fight this, but for now we need to do two separate visits.  - We will know more after we do testing at the next visit.  - The skin testing visit can be squeezed in at your convenience.  - Then we can make a more full plan to address all of your symptoms. - Be sure to stop your antihistamines for 3 days before this appointment.  - We are going to start nasal ipratropium, which might help with your symptoms.  - You can use one spray per nostril three time daily, but start at once daily and see how that goes.   3. Return in about 1 week (around 10/19/2024) for SKIN TESTING. You can have the follow up appointment with Dr. Iva or a Nurse Practicioner (our Nurse Practitioners are excellent and always have Physician oversight!).    Please inform us  of any Emergency Department visits, hospitalizations, or changes in symptoms. Call us  before going to the ED for breathing or allergy symptoms since we might be able to fit you in for a sick visit. Feel free to contact us  anytime with any questions, problems, or concerns.  It was a pleasure to meet you today!  Websites that have reliable patient information: 1. American Academy of Asthma, Allergy, and Immunology: www.aaaai.org 2. Food Allergy Research and Education (FARE): foodallergy.org 3. Mothers of Asthmatics: http://www.asthmacommunitynetwork.org 4. American College of Allergy, Asthma, and Immunology: www.acaai.org      "Like" us  on Facebook and Instagram for our latest updates!      A healthy democracy works best when [employer] participate! Make sure you are registered to vote! If you have moved or changed any of your contact information, you will need to get  this updated before voting! Scan the QR codes below to learn more!

## 2024-10-12 NOTE — Progress Notes (Signed)
 NEW PATIENT  Date of Service/Encounter:  10/12/24  Consult requested by: Rosamond Leta NOVAK, MD   Assessment:   COPD GOLD 0 - followed by Dr. [name]   Chronic rhinitis - planning for skin testing at the next visit  Failed Nasacort, Benadryl , chlorpheniramine, Allegra, Flonase, Dymista   Lung cancer - diagnosed in 2017 s/p chemo/radiation   Currently disabled due to above problems  Plan/Recommendations:   1. COPD GOLD 0 - Lung testing looked really good today, all things considered.  - Dr. [name] is doing a great job with your breathing.  2. Chronic rhinitis - Because of insurance stipulations, we cannot do skin testing on the same day as your first visit. - We are all working to fight this, but for now we need to do two separate visits.  - We will know more after we do testing at the next visit.  - The skin testing visit can be squeezed in at your convenience.  - Then we can make a more full plan to address all of your symptoms. - Be sure to stop your antihistamines for 3 days before this appointment.  - We are going to start nasal ipratropium, which might help with your symptoms.  - You can use one spray per nostril three time daily, but start at once daily and see how that goes.   3. Return in about 1 week (around 10/19/2024) for SKIN TESTING. You can have the follow up appointment with Dr. Iva or a Nurse Practicioner (our Nurse Practitioners are excellent and always have Physician oversight!).    This note in its entirety was forwarded to the Provider who requested this consultation.  Subjective:   Emily Pope is a 68 y.o. female presenting today for evaluation of  Chief Complaint  Patient presents with   Sinusitis    Pt is here to establish care. She c/o allergy and sinus symptoms with congestion. She states she has a history of allergies, denies asthma diagnosis, she has COPD and a history of lung cancer.     Skii Cleland has a history of the  following: Patient Active Problem List   Diagnosis Date Noted   Anemia 08/28/2024   Rhinitis, chronic with assoc globus/VCD 03/21/2024   Parapneumonic effusion 11/09/2023   Bronchopneumonia 11/09/2023   Acute pancreatitis without infection or necrosis 11/06/2023   Alcohol use 11/06/2023   Cardiac chest pain 11/01/2023   DOE (dyspnea on exertion) 11/01/2023   Upper airway cough syndrome 09/12/2023   COPD GOLD 0 09/11/2023   H/O: lung cancer    Iron deficiency 06/24/2021   Oral candidiasis 01/29/2021   Aortic atherosclerosis 01/22/2021   Community acquired pneumonia 01/22/2021   Essential hypertension 01/22/2021   Arthritis pain 10/28/2017   Pneumonitis 05/09/2017   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  Discussed the use of AI scribe software for clinical note transcription with the patient and/or guardian, who gave verbal consent to proceed.  Ivannia Willhelm was referred by Rosamond Leta NOVAK, MD.     Emily Pope is a 67 y.o. female presenting for an evaluation of allergies and postnasal drip.     Asthma/Respiratory Symptom History: She has a history of lung cancer diagnosed in 2017, treated with chemotherapy and radiation. Surgery was not an option due to the involvement of blood vessels. She is currently monitored every six months for  lymph nodes. Post-cancer, she developed breathing issues and was referred to pulmonology. She uses a nebulizer with albuterol  and budesonide  at least once daily, sometimes twice, and an albuterol  inhaler monthly. She occasionally requires prednisone  for severe coughs.  She never saw Pulmonology before her cancer diagnosis. Her last visit with Dr. [name] was in August 2025.  At that time, she was doing very well but continued to have a lot of nasal discharge.  She had an absolute eosinophil  count of 300 in January 2025.  Her IgE level was 15 in July 2025.  She had a sinus CT in January 2025 that showed small bilateral maxillary sinus retention cysts as well as a leftward nasal septal deviation.  Of note, she did see Dr. Soldatova in March 2025.  She currently sees Delon Hope, FNP at the cancer center.  Her last visit was in September 2025.  At that time, she continued to have anemia secondary to many etiologies.  She was instructed to stop the B12 supplements and they were to recheck her levels in 3 months.  She was having some bloating which was felt to be secondary to GERD.  For her lung cancer, she had a chest CT in August 2025 that showed a new 3 mm right middle lobe nodule.  They are just going to follow this serially.  CT chest (08/20/2024): New 3 mm right middle lobe nodule.  Additional areas of nodularity in the lungs are stable.  Recommend continued follow-up.  Postradiation scarring bilaterally.  Emphysema.   Allergic Rhinitis Symptom History: She experiences persistent nasal congestion and post-nasal drip, describing it as 'the congestion gathers in my throat.' These symptoms have been present since her twenties and have worsened with age. The nasal discharge varies from clear to watery, and she often feels her head is 'stopped up.' She has tried various medications including Nasacort, Benadryl , chlorpheniramine, Allegra, and Flonase, but none have provided significant relief. Congestion is the most bothersome symptom, exacerbating her breathing difficulties.   Her sinus issues are longstanding, with no significant history of sinus or ear infections, but she experiences significant throat drainage. She has been evaluated by an ENT. She uses nasal strips for relief and avoids scented cleaning products due to irritation.  She grew up in [place]  and moved at age 48. She worked in [employer] for 11 years before going on disability. She avoids scented cleaning  products and prefers those with food-like or lemon scents to prevent irritation.   Otherwise, there is no history of other atopic diseases, including drug allergies, stinging insect allergies, or contact dermatitis. There is no significant infectious history. Vaccinations are up to date.    Past Medical History: Patient Active Problem List   Diagnosis Date Noted   Anemia 08/28/2024   Rhinitis, chronic with assoc globus/VCD 03/21/2024   Parapneumonic effusion 11/09/2023   Bronchopneumonia 11/09/2023   Acute pancreatitis without infection or necrosis 11/06/2023   Alcohol use 11/06/2023   Cardiac chest pain 11/01/2023   DOE (dyspnea on exertion) 11/01/2023   Upper airway cough syndrome 09/12/2023   COPD GOLD 0 09/11/2023   H/O: lung cancer    Iron deficiency 06/24/2021   Oral candidiasis 01/29/2021   Aortic atherosclerosis 01/22/2021   Community acquired pneumonia 01/22/2021   Essential hypertension 01/22/2021   Arthritis pain 10/28/2017   Pneumonitis 05/09/2017   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   GERD (gastroesophageal reflux disease) 10/11/2016   Primary cancer of right upper lobe of lung (HCC) 08/23/2016  Medication List:  Allergies as of 10/12/2024       Reactions   Clindamycin/lincomycin Rash   Codeine Anaphylaxis   Lincomycin Rash   Also Clindamycin as it has lincomycin in it Also Clindamycin as it has lincomycin in it   Other Rash   Penicillins Anaphylaxis   Has patient had a PCN reaction causing immediate rash, facial/tongue/throat swelling, SOB or lightheadedness with hypotension: Unknown Has patient had a PCN reaction causing severe rash involving mucus membranes or skin necrosis: Unknown Has patient had a PCN reaction that required hospitalization: No Has patient had a PCN reaction occurring within the last 10 years: No If all of the above answers are NO, then may proceed with Cephalosporin use. Has patient had a PCN reaction causing immediate rash,  facial/tongue/throat swelling, SOB or lightheadedness with hypotension: Unknown Has patient had a PCN reaction causing severe rash involving mucus membranes or skin necrosis: Unknown Has patient had a PCN reaction that required hospitalization: No Has patient had a PCN reaction occurring within the last 10 years: No If all of the above answers are NO, then may proceed with Cephalosporin use.   Vancomycin  Itching   Scalp itching   Gabapentin     Could not sleep and blood pressure bottomed out   Sulfa Antibiotics Nausea Only   Hydrocodone  Rash   Tizanidine    Other Reaction(s): Other (See Comments) Dry mouth        Medication List        Accurate as of October 12, 2024  9:26 AM. If you have any questions, ask your nurse or doctor.          albuterol  108 (90 Base) MCG/ACT inhaler Commonly known as: VENTOLIN  HFA Inhale 2 puffs into the lungs every 6 (six) hours as needed for wheezing or shortness of breath.   albuterol  (2.5 MG/3ML) 0.083% nebulizer solution Commonly known as: PROVENTIL  Take 3 mLs (2.5 mg total) by nebulization every 4 (four) hours as needed.   aspirin EC 81 MG tablet Take 1 tablet by mouth daily.   budesonide  0.25 MG/2ML nebulizer solution Commonly known as: Pulmicort  Take 2 mLs (0.25 mg total) by nebulization 2 (two) times daily.   buPROPion 300 MG 24 hr tablet Commonly known as: WELLBUTRIN XL Take 300 mg by mouth daily.   CHLOR-TABLETS PO Take by mouth as needed.   cloNIDine  0.1 MG tablet Commonly known as: CATAPRES  Take 1 tablet by mouth twice daily   famotidine  20 MG tablet Commonly known as: PEPCID  TAKE 1 TABLET BY MOUTH ONCE DAILY AFTER SUPPER   hydrochlorothiazide 12.5 MG tablet Commonly known as: HYDRODIURIL Take 12.5 mg by mouth daily as needed (if blood pressure is greater than 128/82).   ipratropium 0.03 % nasal spray Commonly known as: ATROVENT Place 2 sprays into both nostrils 3 (three) times daily. Started by: Marty Morton Shaggy   levocetirizine 5 MG tablet Commonly known as: XYZAL Take 5 mg by mouth every evening.   losartan 50 MG tablet Commonly known as: COZAAR Take 50 mg by mouth daily as needed (if blood pressure is greater than 128/82).   Melatonin 10 MG Tabs Take 10 mg by mouth at bedtime.   metoprolol  tartrate 25 MG tablet Commonly known as: LOPRESSOR  Take 1/2 (one-half) tablet by mouth twice daily   naproxen sodium 220 MG tablet Commonly known as: ALEVE Take 220-440 mg by mouth 2 (two) times daily as needed (pain.).   oxymetazoline 0.05 % nasal spray Commonly known as: AFRIN Place  1 spray into both nostrils 2 (two) times daily.   psyllium 0.52 g capsule Commonly known as: REGULOID Take 0.52 g by mouth daily.   RABEprazole  20 MG tablet Commonly known as: ACIPHEX  TAKE 1 TABLET BY MOUTH 30 TO 60 MINUTES BEFORE FIRST MEAL OF THE DAY   rosuvastatin 10 MG tablet Commonly known as: CRESTOR Take 10 mg by mouth at bedtime.   Tylenol  8 Hour Arthritis Pain 650 MG CR tablet Generic drug: acetaminophen  Take 650 mg by mouth every 8 (eight) hours as needed for pain.   Veozah 45 MG Tabs Generic drug: Fezolinetant Take 1 tablet by mouth daily.   Vitamin D  (Ergocalciferol ) 1.25 MG (50000 UNIT) Caps capsule Commonly known as: DRISDOL  Take by mouth.        Birth History: non-contributory  Developmental History: non-contributory  Past Surgical History: Past Surgical History:  Procedure Laterality Date   ABDOMINAL HYSTERECTOMY     APPENDECTOMY     when had hysterectomy   CARPAL TUNNEL RELEASE Bilateral    ENDOBRONCHIAL ULTRASOUND Bilateral 08/23/2016   Procedure: ENDOBRONCHIAL ULTRASOUND;  Surgeon: Lamar GORMAN Chris, MD;  Location: [facility];  Service: Cardiopulmonary;  Laterality: Bilateral;   HERNIA REPAIR     left inguinal hernia age 76   PORT-A-CATH REMOVAL Left 11/02/2021   Procedure: MINOR REMOVAL PORT-A-CATH;  Surgeon: Mavis Anes, MD;  Location: [facility];  Service:  General;  Laterality: Left;  pt to arrive at 8:30   PORTACATH PLACEMENT Left 09/17/2016   Procedure: INSERTION PORT-A-CATH LEFT SUBCLAVIAN;  Surgeon: Anes Mavis, MD;  Location: [facility];  Service: General;  Laterality: Left;   TUBAL LIGATION       Family History: Family History  Problem Relation Age of Onset   Hypertension Mother    Lung cancer Mother    Hypertension Father    Diabetes Father    Hypertension Sister    Hypertension Sister    Lung cancer Brother    Colon cancer Neg Hx    Breast cancer Neg Hx      Social History: Anagha lives at home in a house that was built in the late 1950s. She has hardwood throughout the home with area rugs. There is oil heating and central cooling. There is a dog in the home. There are no dust mite coverings on the bedding. There is no tobacco exposure in the home. She is currently on disability. She is not exposed to fumes, chemicals, or dust.    Review of systems otherwise negative other than that mentioned in the HPI.    Objective:   Blood pressure 112/74, pulse 82, temperature 98.1 F (36.7 C), temperature source Temporal, height 4' 11.25 (1.505 m), weight 136 lb 6.4 oz (61.9 kg), last menstrual period 08/04/1985, SpO2 100%. Body mass index is 27.32 kg/m.     Physical Exam Vitals reviewed.  Constitutional:      Appearance: She is well-developed.     Comments: Talkative.   HENT:     Head: Normocephalic and atraumatic.     Right Ear: Tympanic membrane, ear canal and external ear normal. No drainage, swelling or tenderness. Tympanic membrane is not injected, scarred, erythematous, retracted or bulging.     Left Ear: Tympanic membrane, ear canal and external ear normal. No drainage, swelling or tenderness. Tympanic membrane is not injected, scarred, erythematous, retracted or bulging.     Nose: No nasal deformity, septal deviation, mucosal edema or rhinorrhea.     Right Turbinates: Enlarged, swollen and pale.  Left  Turbinates: Enlarged, swollen and pale.     Right Sinus: No maxillary sinus tenderness or frontal sinus tenderness.     Left Sinus: No maxillary sinus tenderness or frontal sinus tenderness.     Mouth/Throat:     Mouth: Mucous membranes are not pale and not dry.     Pharynx: Uvula midline.  Eyes:     General:        Right eye: No discharge.        Left eye: No discharge.     Conjunctiva/sclera: Conjunctivae normal.     Right eye: Right conjunctiva is not injected. No chemosis.    Left eye: Left conjunctiva is not injected. No chemosis.    Pupils: Pupils are equal, round, and reactive to light.  Cardiovascular:     Rate and Rhythm: Normal rate and regular rhythm.     Heart sounds: Normal heart sounds.  Pulmonary:     Effort: Pulmonary effort is normal. No tachypnea, accessory muscle usage or respiratory distress.     Breath sounds: Normal breath sounds. No wheezing, rhonchi or rales.     Comments: Moving air well in all lung fields. No increased work of breathing noted.  Chest:     Chest wall: No tenderness.  Abdominal:     Tenderness: There is no abdominal tenderness. There is no guarding or rebound.  Lymphadenopathy:     Head:     Right side of head: No submandibular, tonsillar or occipital adenopathy.     Left side of head: No submandibular, tonsillar or occipital adenopathy.     Cervical: No cervical adenopathy.  Skin:    Coloration: Skin is not pale.     Findings: No abrasion, erythema, petechiae or rash. Rash is not papular, urticarial or vesicular.  Neurological:     Mental Status: She is alert.  Psychiatric:        Behavior: Behavior is cooperative.      Diagnostic studies:    Spirometry: results normal (FEV1: 1.47/82%, FVC: 1.92/86%, FEV1/FVC: 77%).    Spirometry consistent with normal pattern.   Allergy Studies: none          Marty Shaggy, MD [facility] 

## 2024-10-19 ENCOUNTER — Encounter: Payer: Self-pay | Admitting: Allergy & Immunology

## 2024-10-19 ENCOUNTER — Ambulatory Visit: Admitting: Allergy & Immunology

## 2024-10-19 DIAGNOSIS — J3089 Other allergic rhinitis: Secondary | ICD-10-CM

## 2024-10-19 DIAGNOSIS — J310 Chronic rhinitis: Secondary | ICD-10-CM

## 2024-10-19 MED ORDER — IPRATROPIUM BROMIDE 0.06 % NA SOLN: 2.0000 | Freq: Three times a day (TID) | NASAL | 5 refills | Status: DC

## 2024-10-19 NOTE — Progress Notes (Signed)
 FOLLOW UP  Date of Service/Encounter:  10/19/24   Assessment:   COPD GOLD 0 - followed by Dr. [name]    Chronic nonallergic rhinitis - checking blood work to be sure   Failed Nasacort, Benadryl , chlorpheniramine, Allegra, Flonase, Dymista    Lung cancer - diagnosed in 2017 s/p chemo/radiation    Currently disabled due to above problems  Plan/Recommendations:   1. COPD GOLD 0 - Lung testing looked really good at the first visit.  - Dr. [name] is doing a great job with your breathing.  2. Chronic rhinitis - Testing today showed: NEGATIVE TO THE ENTIRE PANEL - Unfortunately, the histamine was NON REACTIVE as well, so we want to be able to believe the results. - Therefore, we are going to do blood work instead.  - Copy of test results provided.  - Continue with: Atrovent (ipratropium) 0.06% one spray per nostril 3-4 times daily as needed (CAN BE OVER DRYING) and chlorpheniramine as you are doing. - We are INCREASING the ipratropium dosing as well.   - We will see how this goes.   3. Return in about 3 months (around 01/19/2025). You can have the follow up appointment with Dr. Iva or a Nurse Practicioner (our Nurse Practitioners are excellent and always have Physician oversight!).   Subjective:   Emily Pope is a 68 y.o. female presenting today for follow up of No chief complaint on file.   Emily Pope has a history of the following: Patient Active Problem List   Diagnosis Date Noted   Anemia 08/28/2024   Rhinitis, chronic with assoc globus/VCD 03/21/2024   Parapneumonic effusion 11/09/2023   Bronchopneumonia 11/09/2023   Acute pancreatitis without infection or necrosis 11/06/2023   Alcohol use 11/06/2023   Cardiac chest pain 11/01/2023   DOE (dyspnea on exertion) 11/01/2023   Upper airway cough syndrome 09/12/2023   COPD GOLD 0 09/11/2023   H/O: lung cancer    Iron deficiency 06/24/2021   Oral candidiasis 01/29/2021   Aortic atherosclerosis 01/22/2021    Community acquired pneumonia 01/22/2021   Essential hypertension 01/22/2021   Arthritis pain 10/28/2017   Pneumonitis 05/09/2017   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  Discussed the use of AI scribe software for clinical note transcription with the patient and/or guardian, who gave verbal consent to proceed.  Emily Pope is a 68 y.o. female presenting for skin testing. She was last seen on October 17. We could not do testing because her insurance company does not cover testing on the same day as a New Patient visit. She has been off of all antihistamines 3 days in anticipation of the testing.   She experiences tingling sensations and has not taken her antihistamine since Monday night, believing it should have been sufficient. She wants another antihistamine.  In the spring, oak tags cause her skin to itch if they land on her, indicating a history of allergic reactions to environmental allergens, particularly during the spring season.  Otherwise, there have been no changes to her past medical history, surgical history, family history, or social history.    Review of systems otherwise negative other than that mentioned in the HPI.    Objective:   Last menstrual period 08/04/1985. There is no height or weight on file to calculate BMI.    Physical exam deferred since this was a skin testing appointment only.  Diagnostic studies:   Allergy Studies:     Airborne Adult Perc - 10/19/24 0909     Time Antigen Placed 0909    Allergen Manufacturer Jestine    Location Back    Number of Test 55    1. Control-Buffer 50% Glycerol Negative    2. Control-Histamine Negative    3. Bahia Negative    4. French Southern Territories Negative    5. Johnson Negative    6. [place]  Blue Negative    7. Meadow Fescue Negative    8. Perennial Rye  Negative    9. Timothy Negative    10. Ragweed Mix Negative    11. Cocklebur Negative    12. Plantain,  English Negative    13. Baccharis Negative    14. Dog Fennel Negative    15. Russian Thistle Negative    16. Lamb's Quarters Negative    17. Sheep Sorrell Negative    18. Rough Pigweed Negative    19. Marsh Elder, Rough Negative    20. Mugwort, Common Negative    21. Box, Elder Negative    22. Cedar, red Negative    23. Sweet Gum Negative    24. Pecan Pollen Negative    25. Pine Mix Negative    26. Walnut, Black Pollen Negative    27. Red Mulberry Negative    28. Ash Mix Negative    29. Birch Mix Negative    30. Beech American Negative    31. Cottonwood, [place] Negative    32. Hickory, White Negative    33. Maple Mix Negative    34. Oak, [place] Mix Negative    35. Sycamore Eastern Negative    36. Alternaria Alternata Negative    37. Cladosporium Herbarum Negative    38. Aspergillus Mix Negative    39. Penicillium Mix Negative    40. Bipolaris Sorokiniana (Helminthosporium) Negative    41. Drechslera Spicifera (Curvularia) Negative    42. Mucor Plumbeus Negative    43. Fusarium Moniliforme Negative    44. Aureobasidium Pullulans (pullulara) Negative    45. Rhizopus Oryzae Negative    46. Botrytis Cinera Negative    47. Epicoccum Nigrum Negative    48. Phoma Betae Negative    49. Dust Mite Mix Negative    50. Cat Hair 10,000 BAU/ml Negative    51.  Dog Epithelia Negative    52. Mixed Feathers Negative    53. Horse Epithelia Negative    54. Cockroach, German Negative    55. Tobacco Leaf Negative          Allergy testing results were read and interpreted by myself, documented by clinical staff.      Marty Shaggy, MD  [facility] 

## 2024-10-19 NOTE — Patient Instructions (Addendum)
 1. COPD GOLD 0 - Lung testing looked really good at the first visit.  - Dr. [name] is doing a great job with your breathing.  2. Chronic rhinitis - Testing today showed: NEGATIVE TO THE ENTIRE PANEL - Unfortunately, the histamine was NON REACTIVE as well, so we want to be able to believe the results. - Therefore, we are going to do blood work instead.  - Copy of test results provided.  - Continue with: Atrovent (ipratropium) 0.06% one spray per nostril 3-4 times daily as needed (CAN BE OVER DRYING) and chlorpheniramine as you are doing. - We are INCREASING the ipratropium dosing as well.   - We will see how this goes.   3. Return in about 3 months (around 01/19/2025). You can have the follow up appointment with Dr. Iva or a Nurse Practicioner (our Nurse Practitioners are excellent and always have Physician oversight!).    Please inform us  of any Emergency Department visits, hospitalizations, or changes in symptoms. Call us  before going to the ED for breathing or allergy symptoms since we might be able to fit you in for a sick visit. Feel free to contact us  anytime with any questions, problems, or concerns.  It was a pleasure to see you again today!  Websites that have reliable patient information: 1. American Academy of Asthma, Allergy, and Immunology: www.aaaai.org 2. Food Allergy Research and Education (FARE): foodallergy.org 3. Mothers of Asthmatics: http://www.asthmacommunitynetwork.org 4. American College of Allergy, Asthma, and Immunology: www.acaai.org      "Like" us  on Facebook and Instagram for our latest updates!      A healthy democracy works best when [employer] participate! Make sure you are registered to vote! If you have moved or changed any of your contact information, you will need to get this updated before voting! Scan the QR codes below to learn more!       [facility] Adult [facility]erc - 10/19/24 0909     Time Antigen Placed 9090    Allergen Manufacturer Jestine     Location Back    Number of Test 55    1. Control-Buffer 50% Glycerol Negative    2. Control-Histamine Negative    3. Bahia Negative    4. French [place] Negative    5. Johnson Negative    6. [place]  Blue Negative    7. Meadow Fescue Negative    8. Perennial Rye Negative    9. Timothy Negative    10. Ragweed Mix Negative    11. Cocklebur Negative    12. Plantain,  English Negative    13. Baccharis Negative    14. Dog Fennel Negative    15. Russian Thistle Negative    16. Lamb's Quarters Negative    17. Sheep Sorrell Negative    18. Rough Pigweed Negative    19. Marsh Elder, Rough Negative    20. Mugwort, Common Negative    21. Box, Elder Negative    22. Cedar, red Negative    23. Sweet Gum Negative    24. Pecan Pollen Negative    25. Pine Mix Negative    26. Walnut, Black Pollen Negative    27. Red Mulberry Negative    28. Ash Mix Negative    29. Birch Mix Negative    30. Beech American Negative    31. Cottonwood, Guinea[place]issau Negative    32. Hickory, White Negative    33. Maple Mix Negative    34. Oak, [place] Mix Negative    35. Sycamore Eastern Negative  36. Alternaria Alternata Negative    37. Cladosporium Herbarum Negative    38. Aspergillus Mix Negative    39. Penicillium Mix Negative    40. Bipolaris Sorokiniana (Helminthosporium) Negative    41. Drechslera Spicifera (Curvularia) Negative    42. Mucor Plumbeus Negative    43. Fusarium Moniliforme Negative    44. Aureobasidium Pullulans (pullulara) Negative    45. Rhizopus Oryzae Negative    46. Botrytis Cinera Negative    47. Epicoccum Nigrum Negative    48. Phoma Betae Negative    49. Dust Mite Mix Negative    50. Cat Hair 10,000 BAU/ml Negative    51.  Dog Epithelia Negative    52. Mixed Feathers Negative    53. Horse Epithelia Negative    54. Cockroach, German Negative    55. Tobacco Leaf Negative          Rhinitis (Hayfever) Overview  There are two types of rhinitis: allergic and  non-allergic.  Allergic Rhinitis If you have allergic rhinitis, your immune system mistakenly identifies a typically harmless substance as an intruder. This substance is called an allergen. The immune system responds to the allergen by releasing histamine and chemical mediators that typically cause symptoms in the nose, throat, eyes, ears, skin and roof of the mouth.  Seasonal allergic rhinitis (hay fever) is most often caused by pollen carried in the air during different times of the year in different parts of the country.  Allergic rhinitis can also be triggered by common indoor allergens such as the dried skin flakes, urine and saliva found on pet dander, mold, droppings from dust mites and cockroach particles. This is called perennial allergic rhinitis, as symptoms typically occur year-round.  In addition to allergen triggers, symptoms may also occur from irritants such as smoke and strong odors, or to changes in the temperature and humidity of the air. This happens because allergic rhinitis causes inflammation in the nasal lining, which increases sensitivity to inhalants.  Many people with allergic rhinitis are prone to allergic conjunctivitis (eye allergy). In addition, allergic rhinitis can make symptoms of asthma worse for people who suffer from both conditions.  Nonallergic Rhinitis At least one out of three people with rhinitis symptoms do not have allergies. Nonallergic rhinitis usually afflicts adults and causes year-round symptoms, especially runny nose and nasal congestion. This condition differs from allergic rhinitis because the immune system is not involved.  Check out this information handout from the [employer] of Asthma, Allergy, and Immunology on rhinitis!   https://rebrand.ly/AAC-Rhinitis

## 2024-10-27 ENCOUNTER — Other Ambulatory Visit: Payer: Self-pay | Admitting: Internal Medicine

## 2024-10-28 LAB — ALLERGENS W/COMP RFLX AREA 2
Alternaria Alternata IgE: <0.1 kU/L
Aspergillus Fumigatus IgE: <0.1 kU/L
Bermuda Grass IgE: <0.1 kU/L
Cedar, Mountain IgE: <0.1 kU/L
Cladosporium Herbarum IgE: <0.1 kU/L
Cockroach, German IgE: <0.1 kU/L
Common Silver Birch IgE: <0.1 kU/L
Cottonwood IgE: <0.1 kU/L
D Farinae IgE: <0.1 kU/L
D Pteronyssinus IgE: <0.1 kU/L
E001-IgE Cat Dander: <0.1 kU/L
E005-IgE Dog Dander: <0.1 kU/L
Elm, American IgE: <0.1 kU/L
IgE (Immunoglobulin E), Serum: 15 [IU]/mL (ref 6–495)
Johnson Grass IgE: <0.1 kU/L
Maple/Box Elder IgE: <0.1 kU/L
Mouse Urine IgE: <0.1 kU/L
Oak, White IgE: <0.1 kU/L
Pecan, Hickory IgE: <0.1 kU/L
Penicillium Chrysogen IgE: <0.1 kU/L
Pigweed, Rough IgE: <0.1 kU/L
Ragweed, Short IgE: <0.1 kU/L
Sheep Sorrel IgE Qn: <0.1 kU/L
Timothy Grass IgE: <0.1 kU/L
White Mulberry IgE: <0.1 kU/L

## 2024-10-28 LAB — ALLERGEN PROFILE, MOLD
Aureobasidi Pullulans IgE: <0.1 kU/L
Candida Albicans IgE: <0.1 kU/L
M009-IgE Fusarium proliferatum: <0.1 kU/L
M014-IgE Epicoccum purpur: <0.1 kU/L
Mucor Racemosus IgE: <0.1 kU/L
Phoma Betae IgE: <0.1 kU/L
Setomelanomma Rostrat: <0.1 kU/L
Stemphylium Herbarum IgE: <0.1 kU/L

## 2024-10-29 ENCOUNTER — Other Ambulatory Visit: Payer: Self-pay | Admitting: Internal Medicine

## 2024-10-29 DIAGNOSIS — Z1231 Encounter for screening mammogram for malignant neoplasm of breast: Secondary | ICD-10-CM

## 2024-10-30 ENCOUNTER — Ambulatory Visit
Admission: RE | Admit: 2024-10-30 | Discharge: 2024-10-30 | Disposition: A | Discharge status: Home / Self Care | Source: Ambulatory Visit | Attending: Internal Medicine | Admitting: Internal Medicine

## 2024-10-30 DIAGNOSIS — Z1231 Encounter for screening mammogram for malignant neoplasm of breast: Secondary | ICD-10-CM

## 2024-11-05 ENCOUNTER — Other Ambulatory Visit: Payer: Self-pay | Admitting: Internal Medicine

## 2024-11-05 ENCOUNTER — Ambulatory Visit: Payer: Self-pay | Admitting: Allergy & Immunology

## 2024-11-12 NOTE — Telephone Encounter (Signed)
 Lab results have been faxed to pcp Keavie Hairfield,NP- per patient. [facility]- Fax # [phone].

## 2024-11-27 ENCOUNTER — Inpatient Hospital Stay: Attending: Hematology

## 2024-11-27 ENCOUNTER — Ambulatory Visit (HOSPITAL_COMMUNITY)
Admission: RE | Admit: 2024-11-27 | Discharge: 2024-11-27 | Disposition: A | Source: Ambulatory Visit | Attending: Oncology | Admitting: Oncology

## 2024-11-27 DIAGNOSIS — Z8701 Personal history of pneumonia (recurrent): Secondary | ICD-10-CM | POA: Diagnosis not present

## 2024-11-27 DIAGNOSIS — C3411 Malignant neoplasm of upper lobe, right bronchus or lung: Secondary | ICD-10-CM | POA: Insufficient documentation

## 2024-11-27 DIAGNOSIS — Z9221 Personal history of antineoplastic chemotherapy: Secondary | ICD-10-CM | POA: Diagnosis not present

## 2024-11-27 DIAGNOSIS — Z85118 Personal history of other malignant neoplasm of bronchus and lung: Secondary | ICD-10-CM | POA: Diagnosis present

## 2024-11-27 DIAGNOSIS — D508 Other iron deficiency anemias: Secondary | ICD-10-CM | POA: Insufficient documentation

## 2024-11-27 DIAGNOSIS — D649 Anemia, unspecified: Secondary | ICD-10-CM

## 2024-11-27 DIAGNOSIS — Z923 Personal history of irradiation: Secondary | ICD-10-CM | POA: Diagnosis not present

## 2024-11-27 LAB — CBC WITH DIFFERENTIAL/PLATELET
Abs Immature Granulocytes: 0.03 K/uL (ref 0.00–0.07)
Basophils Absolute: 0.1 K/uL (ref 0.0–0.1)
Basophils Relative: 1 %
Eosinophils Absolute: 0.3 K/uL (ref 0.0–0.5)
Eosinophils Relative: 3 %
HCT: 34.6 % — ABNORMAL LOW (ref 36.0–46.0)
Hemoglobin: 11.2 g/dL — ABNORMAL LOW (ref 12.0–15.0)
Immature Granulocytes: 0 %
Lymphocytes Relative: 15 %
Lymphs Abs: 1.3 K/uL (ref 0.7–4.0)
MCH: 33.2 pg (ref 26.0–34.0)
MCHC: 32.4 g/dL (ref 30.0–36.0)
MCV: 102.7 fL — ABNORMAL HIGH (ref 80.0–100.0)
Monocytes Absolute: 0.8 K/uL (ref 0.1–1.0)
Monocytes Relative: 9 %
Neutro Abs: 6.3 K/uL (ref 1.7–7.7)
Neutrophils Relative %: 72 %
Platelets: 328 K/uL (ref 150–400)
RBC: 3.37 MIL/uL — ABNORMAL LOW (ref 3.87–5.11)
RDW: 11.9 % (ref 11.5–15.5)
WBC: 8.8 K/uL (ref 4.0–10.5)
nRBC: 0 % (ref 0.0–0.2)

## 2024-11-27 LAB — COMPREHENSIVE METABOLIC PANEL WITH GFR
ALT: 14 U/L (ref 0–44)
AST: 18 U/L (ref 15–41)
Albumin: 4.3 g/dL (ref 3.5–5.0)
Alkaline Phosphatase: 122 U/L (ref 38–126)
Anion gap: 14 (ref 5–15)
BUN: 21 mg/dL (ref 8–23)
CO2: 25 mmol/L (ref 22–32)
Calcium: 9.5 mg/dL (ref 8.9–10.3)
Chloride: 101 mmol/L (ref 98–111)
Creatinine, Ser: 1.26 mg/dL — ABNORMAL HIGH (ref 0.44–1.00)
GFR, Estimated: 47 mL/min — ABNORMAL LOW (ref >60)
Glucose, Bld: 88 mg/dL (ref 70–99)
Potassium: 4.1 mmol/L (ref 3.5–5.1)
Sodium: 140 mmol/L (ref 135–145)
Total Bilirubin: <0.2 mg/dL (ref 0.0–1.2)
Total Protein: 6.9 g/dL (ref 6.5–8.1)

## 2024-11-27 LAB — IRON AND TIBC
Iron: 50 ug/dL (ref 28–170)
Saturation Ratios: 15 % (ref 10.4–31.8)
TIBC: 344 ug/dL (ref 250–450)
UIBC: 295 ug/dL

## 2024-11-27 LAB — VITAMIN B12: Vitamin B-12: 1067 pg/mL — ABNORMAL HIGH (ref 180–914)

## 2024-11-27 LAB — FERRITIN: Ferritin: 62 ng/mL (ref 11–307)

## 2024-12-02 ENCOUNTER — Other Ambulatory Visit: Payer: Self-pay | Admitting: Internal Medicine

## 2024-12-04 ENCOUNTER — Inpatient Hospital Stay: Admitting: Oncology

## 2024-12-04 VITALS — BP 97/75 | HR 75 | Temp 98.2°F | Resp 18 | Ht 60.0 in | Wt 131.0 lb

## 2024-12-04 DIAGNOSIS — D508 Other iron deficiency anemias: Secondary | ICD-10-CM

## 2024-12-04 DIAGNOSIS — C3411 Malignant neoplasm of upper lobe, right bronchus or lung: Secondary | ICD-10-CM

## 2024-12-04 DIAGNOSIS — Z85118 Personal history of other malignant neoplasm of bronchus and lung: Secondary | ICD-10-CM | POA: Diagnosis not present

## 2024-12-04 DIAGNOSIS — D509 Iron deficiency anemia, unspecified: Secondary | ICD-10-CM | POA: Insufficient documentation

## 2024-12-04 MED ORDER — FERROUS GLUCONATE 324 (38 FE) MG PO TABS: 324.0000 mg | ORAL_TABLET | Freq: Every day | ORAL | 3 refills | Status: AC

## 2024-12-04 NOTE — Assessment & Plan Note (Signed)
 Patient was found to have mild IDA during most recent lab draw. Labs from 11/27/2024 show iron saturations of 15% with a normal TIBC.  B12 elevated at 1067 although it is improved from elevated B12 levels in the past. Ferritin is 62. Anemia likely multifactorial from CKD as well as some iron deficiency. Discussed starting iron supplements 1/day with vitamin C.  May take stool softener such as Colace or MiraLAX to help with constipation. Recheck labs in 3 to 4 months.

## 2024-12-04 NOTE — Assessment & Plan Note (Addendum)
-   Denies any recent infections. - Reviewed CT chest (11/27/24): Enlarging 1.1 x 0.8 cm spiculated anterior left upper lobe pulmonary nodule suspicious for contralateral recurrence; recommend PET scan and tissue sampling for further evaluation.  Stable right upper lobe posttreatment changes without further new suspicious thoracic disease. -Will go ahead and order PET scan and telephone call to discuss results.  Patient will likely need biopsy so we will refer her to pulmonology for EBUS.  She is followed by Dr. [name] will reach out to him so he is in the loop. - Labs from 11/27/2024: Normal LFTs.  Creatinine mildly elevated at 1.26.  CBC shows macrocytic anemia hemoglobin 11.2 MCV 102.7.  Differential unremarkable.  Ferritin 62, vitamin B12 1067 iron saturations 15%. - Recommend follow-up in 3 months with repeat CT scan as well as routine labs.

## 2024-12-04 NOTE — Progress Notes (Signed)
 [name] OFFICE PROGRESS NOTE  Rosamond Leta NOVAK, MD  ASSESSMENT & PLAN:    Assessment & Plan Primary cancer of right upper lobe of lung (HCC) - Denies any recent infections. - Reviewed CT chest (11/27/24): Enlarging 1.1 x 0.8 cm spiculated anterior left upper lobe pulmonary nodule suspicious for contralateral recurrence; recommend PET scan and tissue sampling for further evaluation.  Stable right upper lobe posttreatment changes without further new suspicious thoracic disease. -Will go ahead and order PET scan and telephone call to discuss results.  Patient will likely need biopsy so we will refer her to pulmonology for EBUS.  She is followed by Dr. [name] will reach out to him so he is in the loop. - Labs from 11/27/2024: Normal LFTs.  Creatinine mildly elevated at 1.26.  CBC shows macrocytic anemia hemoglobin 11.2 MCV 102.7.  Differential unremarkable.  Ferritin 62, vitamin B12 1067 iron saturations 15%. - Recommend follow-up in 3 months with repeat CT scan as well as routine labs.   Other iron deficiency anemia Patient was found to have mild IDA during most recent lab draw. Labs from 11/27/2024 show iron saturations of 15% with a normal TIBC.  B12 elevated at 1067 although it is improved from elevated B12 levels in the past. Ferritin is 62. Anemia likely multifactorial from CKD as well as some iron deficiency. Discussed starting iron supplements 1/day with vitamin C.  May take stool softener such as Colace or MiraLAX to help with constipation. Recheck labs in 3 to 4 months.  Orders Placed This Encounter  Procedures   NM PET Image Initial (PI) Skull Base To Thigh    Standing Status:   Future    Expected Date:   12/11/2024    Expiration Date:   12/04/2025    If indicated for the ordered procedure, I authorize the administration of a radiopharmaceutical per Radiology protocol:   Yes    Preferred imaging location?:   [name]   Iron and TIBC ([facility]  ONLY)    Standing Status:   Future    Expected Date:   03/05/2025    Expiration Date:   06/03/2025   Vitamin B12    Standing Status:   Future    Expected Date:   03/05/2025    Expiration Date:   06/03/2025   Ferritin    Standing Status:   Future    Expected Date:   03/05/2025    Expiration Date:   06/03/2025   Comprehensive metabolic panel    Standing Status:   Future    Expected Date:   03/05/2025    Expiration Date:   06/03/2025    INTERVAL HISTORY: Patient returns for follow-up for history of lung cancer.  Recently had imaging and is here to review most recent CT scan.  Denies any interval hospitalizations, surgeries or changes to her baseline health.  She is followed closely by pulmonology Dr. [name] and saw them last on 08/16/2024.  Reports her breathing continues to worsen and normal activities are very challenging to do.  She is unable to walk to her mailbox anymore and has to drive.  Reports that she bends over and stands up it takes her a long time to recover.  She reports no energy.  She has a cough chronically but has coughing spells that take her out for the entire day.  Reports she has tried everything for her cough.  She has been seen by an allergist and her PCP regularly for these.  Reports she coughs up colorful sputum with occasional blood.  She has been given prednisone  and antibiotics last about 2 weeks ago.  Reports she feels bloated and has noticed new onset upper abdominal discomfort.  Reports early satiety although weight has remained relatively stable.  Reports her breathing is worse when the temperature changes and with different odors.  Her PCP thinks it is likely related to depression/anxiety due to the death of her boyfriend about 2 years ago and recently she had to put down her dog.  She does not feel like she is depressed.   We reviewed folate, iron panel, ferritin, B12, CMP and CBC.  SUMMARY OF HEMATOLOGIC HISTORY: Oncology History Overview Note  Stage IIIB (T4 N3 M0)  carcinoma of RUL of lung, immunophenotyping consistent with adenocarcinoma.  S/P concomitant chemoradiation with carboplatin /paclitaxel  (09/20/2016- 10/25/2016) followed by 2 cycle of consolidative chemotherapy with carboplatin /paclitaxel  (11/15/2016- 12/06/2016).  Unable to tolerate consolidative durvalumab  (Imfinzi ) beginning on 01/26/2016 and stopped 05/2017.   AND Imfinzi -induced pneumonitis, grade 2, resulting in holding Imfinzi  from 04/25/2017- 05/09/2017 with corticosteroids treatment with taper.  This resolved/improved with steroids. Developed pnemonitis again after rechallenge with imfinzi . On steroid taper again.     Primary cancer of right upper lobe of lung (HCC)  08/04/2016 PET scan   PET IMPRESSION: 1. 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 Mass is confluent with the right superior hilum and right lower tracheal wall, suggestive of a T4 primary tumor. 2. Postobstructive pneumonia in the right upper lobe. 3. Hypermetabolic ipsilateral and contralateral mediastinal and contralateral hilar lymphadenopathy, suggestive of N3 nodal disease.   08/24/2016 Procedure   Bronchoscopy   08/25/2016 Pathology Results   Diagnosis Endobronchial biopsy, RUL - POORLY DIFFERENTIATED NON-SMALL CELL CARCINOMA. The immunophenotype is consistent with poorly differentiated adenocarcinoma.   09/10/2016 Imaging   MRI brain- No metastatic disease or acute intracranial abnormality.   09/20/2016 - 10/25/2016 Chemotherapy   Carboplatin /Paclitaxel  weekly x 6 with XRT.   09/20/2016 - 10/25/2016 Radiation Therapy     11/15/2016 - 12/06/2016 Chemotherapy   Carboplatin /paclitaxel  every 21 days x 2 cycles.   12/30/2016 Imaging   CT CAP- 1. Central right upper lobe lung mass has mildly decreased since 11/21/2016 chest CT angiogram and significantly decreased  since 08/04/2016 PET-CT. 2. No pathologically enlarged thoracic lymph nodes. Previously described hypermetabolic thoracic adenopathy on the 08/04/2016 PET-CT has decreased in size. 3. Previously described bilateral lower lobe pulmonary nodules are stable in size. 4. New solitary subsolid 4 mm right middle lobe pulmonary nodule is nonspecific, and may be inflammatory. Recommend attention on a follow-up chest CT in 3 months. 5. No definite metastatic disease in the abdomen. Tiny sub 5 mm low-attenuation lesions in the liver are too small to characterize, favor benign. 6. Aortic atherosclerosis.  One vessel coronary atherosclerosis. 7. Moderate emphysema.   01/25/2017 -  Chemotherapy   Durvalumab  (Imfinzi ) x 52 weeks.    03/31/2017 Imaging   CT chest with contrast: Stable size of partially calcified mass in the posterior right upper lobe.   Increased airspace disease in paramediastinal right upper and superior right lower lobes. Differential diagnosis includes radiation pneumonitis, drug reaction, and infection.   Stable sub-cm right lower lobe pulmonary nodule.   No evidence of lymphadenopathy or pleural effusion.   Emphysema.   Aortic and coronary artery atherosclerosis.   04/25/2017 Adverse Reaction   Progressive cough, concerning for pneumonitis.  04/25/2017 Treatment Plan Change   Progressive cough concerning for pneumonitis.  Treatment held.  Managed by steroids.   05/09/2017 Treatment Plan Change   Restart Imfinzi  with improvement of cough, Grade 1.   05/27/2017 Imaging   CT CHEST WITH CONTRAST IMPRESSION: 1. Similar size of partially calcified posterior right apical lung mass. 2. Similar to minimal increase in surrounding radiation fibrosis. 3. Subtle areas of mosaic attenuation are greater on the right. Likely attributed to mild ground-glass opacity. In the appropriate clinical setting, this could represent drug toxicity induced pneumonitis. 4.  Coronary artery  atherosclerosis. Aortic atherosclerosis. 5. Similar right lower lobe pulmonary nodule.   05/27/2017 Adverse Reaction   Developed worsening cough and SOB again after imfinzi . Grade 2. Placed on long steroid taper.  Permanently discontinue any further imfinzi  treatments.   07/29/2017 Imaging   CT chest/abd/pelvis: IMPRESSION: 1. Stable post treatment related changes of mass-like radiation fibrosis in the upper right lung with similar appearance of densely calcified mass in the apex of the right upper lobe. No finding to suggest local recurrence of disease or metastatic disease in the chest, abdomen or pelvis on today's examination. 2. 7 mm subpleural nodule in the periphery of the right lower lobe is stable and favored to be benign. Continued attention on followup studies is recommended to ensure stability. 3. Aortic atherosclerosis, in addition to left anterior descending coronary artery disease. Please note that although the presence of coronary artery calcium documents the presence of coronary artery disease, the severity of this disease and any potential stenosis cannot be assessed on this non-gated CT examination. Assessment for potential risk factor modification, dietary therapy or pharmacologic therapy may be warranted, if clinically indicated. 4. There are calcifications of the mitral valve. Echocardiographic correlation for evaluation of potential valvular dysfunction may be warranted if clinically indicated. 5. Mild diffuse bronchial wall thickening with mild to moderate centrilobular and mild paraseptal emphysema; imaging findings suggestive of underlying COPD. 6. Tip of left subclavian Port-A-Cath is now misdirected into the subclavian vein.    -Diagnosed in August 2017, status post combination chemoradiation therapy and carboplatin  and paclitaxel  from 09/20/2016 through 10/25/2016, followed by 2 cycles of consolidative carboplatin /paclitaxel  from 11/15/2016 through  12/06/2016. -Durvalumab  consolidation from 01/25/2017, discontinued on 05/09/2017 secondary to pneumonitis. -PET scan on 11/20/2018 showed further reduction in activity associated with the right apical consolidation, likely treatment related, without current evidence of malignancy.  Chronically stable 7 mm nodule in the right lateral costophrenic angle without current hypermetabolic activity. -CT of the chest on 06/01/2019 which showed treatment effect with the right upper lobe with no evidence of local recurrence or residual disease. -CT chest 12/11/2019 showed no changes in right apical consolidation.  A new 3 mm left upper lobe nodule is new since the prior exam.  If new left upper lobe nodule has increased in size this would warrant a PET CT followed by possible biopsy if FDG avid. -CT of the chest done on 06/09/2020 showed stable appearance of posttreatment changes involving the right upper lobe with compensatory hyperinflation of the right middle lobe and right lower lobe.  Stable subpleural nodules in the right lower lobe and 3 mm left upper lobe lung nodule.  Previously noted 5 mm  peribronchovascular nodule now measures 3 mm and is likely postinflammatory. -CT chest on 01/22/2021 at [facility] showed new bilateral lower lobe airspace opacities concerning for pneumonia.  Trace bilateral effusions.  No evidence of pulmonary embolism.  Postradiation changes in the right upper lobe, unchanged.  CBC  Component Value Date/Time   WBC 8.8 11/27/2024 0834   RBC 3.37 (L) 11/27/2024 0834   HGB 11.2 (L) 11/27/2024 0834   HGB 11.7 01/23/2024 1023   HGB 12.9 09/02/2016 1403   HCT 34.6 (L) 11/27/2024 0834   HCT 34.8 01/23/2024 1023   HCT 38.4 09/02/2016 1403   PLT 328 11/27/2024 0834   PLT 401 01/23/2024 1023   MCV 102.7 (H) 11/27/2024 0834   MCV 102 (H) 01/23/2024 1023   MCV 94.6 09/02/2016 1403   MCH 33.2 11/27/2024 0834   MCHC 32.4 11/27/2024 0834   RDW 11.9 11/27/2024 0834   RDW 12.3 01/23/2024 1023    RDW 13.6 09/02/2016 1403   LYMPHSABS 1.3 11/27/2024 0834   LYMPHSABS 1.2 01/23/2024 1023   LYMPHSABS 2.5 09/02/2016 1403   MONOABS 0.8 11/27/2024 0834   MONOABS 0.7 09/02/2016 1403   EOSABS 0.3 11/27/2024 0834   EOSABS 0.3 01/23/2024 1023   BASOSABS 0.1 11/27/2024 0834   BASOSABS 0.1 01/23/2024 1023   BASOSABS 0.1 09/02/2016 1403       Latest Ref Rng & Units 11/27/2024    8:34 AM 08/20/2024    7:40 AM 02/10/2024    9:17 AM  CMP  Glucose 70 - 99 mg/dL 88  88    BUN 8 - 23 mg/dL 21  18    Creatinine 9.55 - 1.00 mg/dL 8.73  8.78  8.79   Sodium 135 - 145 mmol/L 140  138    Potassium 3.5 - 5.1 mmol/L 4.1  3.9    Chloride 98 - 111 mmol/L 101  101    CO2 22 - 32 mmol/L 25  24    Calcium 8.9 - 10.3 mg/dL 9.5  9.1    Total Protein 6.5 - 8.1 g/dL 6.9  6.7    Total Bilirubin 0.0 - 1.2 mg/dL <9.7  0.8    Alkaline Phos 38 - 126 U/L 122  104    AST 15 - 41 U/L 18  14    ALT 0 - 44 U/L 14  10       Lab Results  Component Value Date   FERRITIN 62 11/27/2024   VITAMINB12 1,067 (H) 11/27/2024    Vitals:   12/04/24 0943  BP: 97/75  Pulse: 75  Resp: 18  Temp: 98.2 F (36.8 C)  SpO2: 97%     Review of System:  Review of Systems  Constitutional:  Positive for malaise/fatigue.  HENT:  Positive for congestion.   Respiratory:  Positive for cough, hemoptysis, sputum production and shortness of breath.   Gastrointestinal:  Positive for abdominal pain. Negative for blood in stool, constipation, diarrhea, nausea and vomiting.  Psychiatric/Behavioral:  Positive for depression.     Physical Exam: Physical Exam Constitutional:      Appearance: Normal appearance.  HENT:     Head: Normocephalic and atraumatic.  Eyes:     Pupils: Pupils are equal, round, and reactive to light.  Cardiovascular:     Rate and Rhythm: Normal rate and regular rhythm.     Heart sounds: Normal heart sounds. No murmur heard. Pulmonary:     Effort: Pulmonary effort is normal.     Breath sounds: Normal  breath sounds. No wheezing.  Abdominal:     General: Bowel sounds are normal. There is no distension.     Palpations: Abdomen is soft.     Tenderness: There is no abdominal tenderness.  Musculoskeletal:        General: Normal range of motion.  Cervical back: Normal range of motion.  Skin:    General: Skin is warm and dry.     Findings: No rash.  Neurological:     Mental Status: She is alert and oriented to person, place, and time.     Gait: Gait is intact.  Psychiatric:        Mood and Affect: Mood and affect normal.        Cognition and Memory: Memory normal.        Judgment: Judgment normal.      I spent 20 minutes dedicated to the care of this patient (face-to-face and non-face-to-face) on the date of the encounter to include what is described in the assessment and plan.,  Delon Hope, NP 12/04/2024 10:11 AM

## 2024-12-13 ENCOUNTER — Encounter (HOSPITAL_COMMUNITY)
Admission: RE | Admit: 2024-12-13 | Discharge: 2024-12-13 | Disposition: A | Discharge status: Home / Self Care | Source: Ambulatory Visit | Attending: Oncology | Admitting: Oncology

## 2024-12-13 DIAGNOSIS — D508 Other iron deficiency anemias: Secondary | ICD-10-CM | POA: Insufficient documentation

## 2024-12-13 DIAGNOSIS — C3411 Malignant neoplasm of upper lobe, right bronchus or lung: Secondary | ICD-10-CM | POA: Insufficient documentation

## 2024-12-13 MED ORDER — FLUDEOXYGLUCOSE F - 18 (FDG) INJECTION
6.4300 | Freq: Once | INTRAVENOUS | Status: AC | PRN
  Administered 2024-12-13: 11:00:00 6.43 via INTRAVENOUS

## 2024-12-24 ENCOUNTER — Encounter: Payer: Self-pay | Admitting: *Deleted

## 2024-12-25 ENCOUNTER — Ambulatory Visit: Payer: Self-pay | Admitting: Oncology

## 2024-12-25 NOTE — Progress Notes (Signed)
 Hey ladies, her PET scan looks like she may have recurrent lung cancer.  Previously, we discussed getting her back into see Dr. [name] for an EBUS is that still the plan?  I see her tomorrow and just wanted be prepared.  Delon Hope, AGNP-C Department of Hematology/Oncology [facility] at [facility]  Phone: [phone]  12/25/2024 2:28 PM

## 2024-12-26 ENCOUNTER — Inpatient Hospital Stay (HOSPITAL_BASED_OUTPATIENT_CLINIC_OR_DEPARTMENT_OTHER): Admitting: Oncology

## 2024-12-26 DIAGNOSIS — C3411 Malignant neoplasm of upper lobe, right bronchus or lung: Secondary | ICD-10-CM | POA: Diagnosis not present

## 2024-12-26 DIAGNOSIS — R053 Chronic cough: Secondary | ICD-10-CM

## 2024-12-26 DIAGNOSIS — R059 Cough, unspecified: Secondary | ICD-10-CM | POA: Insufficient documentation

## 2024-12-26 DIAGNOSIS — Z85118 Personal history of other malignant neoplasm of bronchus and lung: Secondary | ICD-10-CM | POA: Diagnosis not present

## 2024-12-26 MED ORDER — PREDNISONE 10 MG PO TABS: ORAL_TABLET | ORAL | 0 refills | Status: DC

## 2024-12-26 MED ORDER — AZITHROMYCIN 250 MG PO TABS: ORAL_TABLET | ORAL | 0 refills | Status: AC

## 2024-12-26 NOTE — Progress Notes (Signed)
 "  [facility] OFFICE PROGRESS NOTE  Emily Leta NOVAK, MD  ASSESSMENT & PLAN:    Assessment & Plan Primary cancer of right upper lobe of lung (HCC) - Denies any recent infections. - Reviewed CT chest (11/27/24): Enlarging 1.1 x 0.8 cm spiculated anterior left upper lobe pulmonary nodule suspicious for contralateral recurrence; recommend PET scan and tissue sampling for further evaluation.  Stable right upper lobe posttreatment changes without further new suspicious thoracic disease. - PET scan from 12/13/2024 showed nodule of concern within the left upper lobe measures 1.0 cm which has increased in size and degree of FDG uptake compared to prior exam.  New 4 mm subpleural nodule in the periphery of the left upper lobe with SUV max of 2.6.  Interval increased tracer within the left hilum with SUV of 5.1 compared with 3.2 previously.  Suspicious for residual or recurrent tumor within the left hilum.  Right paratracheal lymph node with SUV max of 4.9 previously 2.6.  Suspicious for nodal metastasis.  Within the area of right hilar radiation change there is a 6 mm focus of increased radiotracer to the right mainstream bronchus with SUV max of 4.7 previously 3.8.  Trace left pleural effusion that is new from previous exam.  New asymmetric increase uptake in the tonsillar region with SUV max of 6.4 etiology indeterminate.  Correlation with direct visualization is advised. - Discussed she will need a biopsy.  Will reach out to Dr. [name] to see if there is somebody in particular he would like her to be seen by. -In the interim, will get MRI of her brain -Patient to follow-up with Dr. Davonna after biopsy MRI of brain.   Chronic cough Patient has acute on chronic cough. Reports cough initially started after she received Imfinzi  for lung cancer treatment several years ago. Over the past 6 months, cough has significantly worsened.  She was on steroids and antibiotics about 2 months ago. Reports  coughing up green/yellow phlegm. Has been coughing so hard it makes her feel like she might blackout. We discussed a long steroid taper along with azithromycin . Will reach out to Dr. [name] per above. Patient to let me know if symptoms worsen.  Orders Placed This Encounter  Procedures   MR Brain W Wo Contrast    Standing Status:   Future    Expiration Date:   12/26/2025    If indicated for the ordered procedure, I authorize the administration of contrast media per Radiology protocol:   Yes    What is the patient's sedation requirement?:   No Sedation    Does the patient have a pacemaker or implanted devices?:   No    Use SRS Protocol?:   No    Preferred imaging location?:   [facility] (table limit - 500lbs)    INTERVAL HISTORY: Patient returns for follow-up for history of lung cancer.  Patient recently had CT scan of her chest which showed enlarging 1.1 x 0.8 cm spiculated anterior left upper lobe pulmonary nodule suspicious for contralateral recurrence which prompted a PET scan.  She is here to review PET scan.  Reports overall she is not doing well.  She has had a cough for quite some time but it is significantly worsened over the past several months.  Reports last Friday she coughed so hard she almost blacked out.  She is coughing up thick yellow-green phlegm.  She is very short of breath.  Appetite and energy levels are 50%.  She  has dizziness on occasion with severe fatigue.  Reports an effort to bend over and put a sock on.  About 6 to 8 weeks ago she was put on steroids and antibiotics which helped some.  We reviewed PET scan.  SUMMARY OF HEMATOLOGIC HISTORY: Oncology History Overview Note  Stage IIIB (T4 N3 M0) carcinoma of RUL of lung, immunophenotyping consistent with adenocarcinoma.  S/P concomitant chemoradiation with carboplatin /paclitaxel  (09/20/2016- 10/25/2016) followed by 2 cycle of consolidative chemotherapy with carboplatin /paclitaxel  (11/15/2016- 12/06/2016).   Unable to tolerate consolidative durvalumab  (Imfinzi ) beginning on 01/26/2016 and stopped 05/2017.   AND Imfinzi -induced pneumonitis, grade 2, resulting in holding Imfinzi  from 04/25/2017- 05/09/2017 with corticosteroids treatment with taper.  This resolved/improved with steroids. Developed pnemonitis again after rechallenge with imfinzi . On steroid taper again.     Primary cancer of right upper lobe of lung (HCC)  08/04/2016 PET scan   PET IMPRESSION: 1. 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 Mass is confluent with the right superior hilum and right lower tracheal wall, suggestive of a T4 primary tumor. 2. Postobstructive pneumonia in the right upper lobe. 3. Hypermetabolic ipsilateral and contralateral mediastinal and contralateral hilar lymphadenopathy, suggestive of N3 nodal disease.   08/24/2016 Procedure   Bronchoscopy   08/25/2016 Pathology Results   Diagnosis Endobronchial biopsy, RUL - POORLY DIFFERENTIATED NON-SMALL CELL CARCINOMA. The immunophenotype is consistent with poorly differentiated adenocarcinoma.   09/10/2016 Imaging   MRI brain- No metastatic disease or acute intracranial abnormality.   09/20/2016 - 10/25/2016 Chemotherapy   Carboplatin /Paclitaxel  weekly x 6 with XRT.   09/20/2016 - 10/25/2016 Radiation Therapy     11/15/2016 - 12/06/2016 Chemotherapy   Carboplatin /paclitaxel  every 21 days x 2 cycles.   12/30/2016 Imaging   CT CAP- 1. Central right upper lobe lung mass has mildly decreased since 11/21/2016 chest CT angiogram and significantly decreased since 08/04/2016 PET-CT. 2. No pathologically enlarged thoracic lymph nodes. Previously described hypermetabolic thoracic adenopathy on the 08/04/2016 PET-CT has decreased in size. 3. Previously described bilateral lower lobe pulmonary nodules are stable in size. 4.  New solitary subsolid 4 mm right middle lobe pulmonary nodule is nonspecific, and may be inflammatory. Recommend attention on a follow-up chest CT in 3 months. 5. No definite metastatic disease in the abdomen. Tiny sub 5 mm low-attenuation lesions in the liver are too small to characterize, favor benign. 6. Aortic atherosclerosis.  One vessel coronary atherosclerosis. 7. Moderate emphysema.   01/25/2017 -  Chemotherapy   Durvalumab  (Imfinzi ) x 52 weeks.    03/31/2017 Imaging   CT chest with contrast: Stable size of partially calcified mass in the posterior right upper lobe.   Increased airspace disease in paramediastinal right upper and superior right lower lobes. Differential diagnosis includes radiation pneumonitis, drug reaction, and infection.   Stable sub-cm right lower lobe pulmonary nodule.   No evidence of lymphadenopathy or pleural effusion.   Emphysema.   Aortic and coronary artery atherosclerosis.   04/25/2017 Adverse Reaction   Progressive cough, concerning for pneumonitis.   04/25/2017 Treatment Plan Change   Progressive cough concerning for pneumonitis.  Treatment held.  Managed by steroids.   05/09/2017 Treatment Plan Change   Restart Imfinzi  with improvement of cough, Grade 1.   05/27/2017 Imaging   CT CHEST WITH CONTRAST IMPRESSION: 1. Similar size of partially calcified posterior right apical lung mass. 2. Similar to minimal increase in surrounding radiation fibrosis. 3. Subtle areas of  mosaic attenuation are greater on the right. Likely attributed to mild ground-glass opacity. In the appropriate clinical setting, this could represent drug toxicity induced pneumonitis. 4.  Coronary artery atherosclerosis. Aortic atherosclerosis. 5. Similar right lower lobe pulmonary nodule.   05/27/2017 Adverse Reaction   Developed worsening cough and SOB again after imfinzi . Grade 2. Placed on long steroid taper.  Permanently discontinue any further imfinzi  treatments.    07/29/2017 Imaging   CT chest/abd/pelvis: IMPRESSION: 1. Stable post treatment related changes of mass-like radiation fibrosis in the upper right lung with similar appearance of densely calcified mass in the apex of the right upper lobe. No finding to suggest local recurrence of disease or metastatic disease in the chest, abdomen or pelvis on today's examination. 2. 7 mm subpleural nodule in the periphery of the right lower lobe is stable and favored to be benign. Continued attention on followup studies is recommended to ensure stability. 3. Aortic atherosclerosis, in addition to left anterior descending coronary artery disease. Please note that although the presence of coronary artery calcium documents the presence of coronary artery disease, the severity of this disease and any potential stenosis cannot be assessed on this non-gated CT examination. Assessment for potential risk factor modification, dietary therapy or pharmacologic therapy may be warranted, if clinically indicated. 4. There are calcifications of the mitral valve. Echocardiographic correlation for evaluation of potential valvular dysfunction may be warranted if clinically indicated. 5. Mild diffuse bronchial wall thickening with mild to moderate centrilobular and mild paraseptal emphysema; imaging findings suggestive of underlying COPD. 6. Tip of left subclavian Port-A-Cath is now misdirected into the subclavian vein.    -Diagnosed in August 2017, status post combination chemoradiation therapy and carboplatin  and paclitaxel  from 09/20/2016 through 10/25/2016, followed by 2 cycles of consolidative carboplatin /paclitaxel  from 11/15/2016 through 12/06/2016. -Durvalumab  consolidation from 01/25/2017, discontinued on 05/09/2017 secondary to pneumonitis. -PET scan on 11/20/2018 showed further reduction in activity associated with the right apical consolidation, likely treatment related, without current evidence of malignancy.   Chronically stable 7 mm nodule in the right lateral costophrenic angle without current hypermetabolic activity. -CT of the chest on 06/01/2019 which showed treatment effect with the right upper lobe with no evidence of local recurrence or residual disease. -CT chest 12/11/2019 showed no changes in right apical consolidation.  A new 3 mm left upper lobe nodule is new since the prior exam.  If new left upper lobe nodule has increased in size this would warrant a PET CT followed by possible biopsy if FDG avid. -CT of the chest done on 06/09/2020 showed stable appearance of posttreatment changes involving the right upper lobe with compensatory hyperinflation of the right middle lobe and right lower lobe.  Stable subpleural nodules in the right lower lobe and 3 mm left upper lobe lung nodule.  Previously noted 5 mm  peribronchovascular nodule now measures 3 mm and is likely postinflammatory. -CT chest on 01/22/2021 [facility] showed new bilateral lower lobe airspace opacities concerning for pneumonia.  Trace bilateral effusions.  No evidence of pulmonary embolism.  Postradiation changes in the right upper lobe, unchanged.  CBC    Component Value Date/Time   WBC 8.8 11/27/2024 0834   RBC 3.37 (L) 11/27/2024 0834   HGB 11.2 (L) 11/27/2024 0834   HGB 11.7 01/23/2024 1023   HGB 12.9 09/02/2016 1403   HCT 34.6 (L) 11/27/2024 0834   HCT 34.8 01/23/2024 1023   HCT 38.4 09/02/2016 1403   PLT 328 11/27/2024 0834   PLT 401 01/23/2024 1023  MCV 102.7 (H) 11/27/2024 0834   MCV 102 (H) 01/23/2024 1023   MCV 94.6 09/02/2016 1403   MCH 33.2 11/27/2024 0834   MCHC 32.4 11/27/2024 0834   RDW 11.9 11/27/2024 0834   RDW 12.3 01/23/2024 1023   RDW 13.6 09/02/2016 1403   LYMPHSABS 1.3 11/27/2024 0834   LYMPHSABS 1.2 01/23/2024 1023   LYMPHSABS 2.5 09/02/2016 1403   MONOABS 0.8 11/27/2024 0834   MONOABS 0.7 09/02/2016 1403   EOSABS 0.3 11/27/2024 0834   EOSABS 0.3 01/23/2024 1023   BASOSABS 0.1 11/27/2024 0834    BASOSABS 0.1 01/23/2024 1023   BASOSABS 0.1 09/02/2016 1403       Latest Ref Rng & Units 11/27/2024    8:34 AM 08/20/2024    7:40 AM 02/10/2024    9:17 AM  CMP  Glucose 70 - 99 mg/dL 88  88    BUN 8 - 23 mg/dL 21  18    Creatinine 9.55 - 1.00 mg/dL 8.73  8.78  8.79   Sodium 135 - 145 mmol/L 140  138    Potassium 3.5 - 5.1 mmol/L 4.1  3.9    Chloride 98 - 111 mmol/L 101  101    CO2 22 - 32 mmol/L 25  24    Calcium 8.9 - 10.3 mg/dL 9.5  9.1    Total Protein 6.5 - 8.1 g/dL 6.9  6.7    Total Bilirubin 0.0 - 1.2 mg/dL <9.7  0.8    Alkaline Phos 38 - 126 U/L 122  104    AST 15 - 41 U/L 18  14    ALT 0 - 44 U/L 14  10       Lab Results  Component Value Date   FERRITIN 62 11/27/2024   VITAMINB12 1,067 (H) 11/27/2024    There were no vitals filed for this visit.    Review of System:  Review of Systems  Constitutional:  Positive for malaise/fatigue.  HENT:  Positive for congestion.   Respiratory:  Positive for cough, hemoptysis, sputum production and shortness of breath.   Gastrointestinal:  Positive for abdominal pain. Negative for blood in stool, constipation, diarrhea, nausea and vomiting.  Psychiatric/Behavioral:  Positive for depression.     Physical Exam: Physical Exam Constitutional:      Appearance: Normal appearance.  HENT:     Head: Normocephalic and atraumatic.  Eyes:     Pupils: Pupils are equal, round, and reactive to light.  Cardiovascular:     Rate and Rhythm: Normal rate and regular rhythm.     Heart sounds: Normal heart sounds. No murmur heard. Pulmonary:     Effort: Pulmonary effort is normal.     Breath sounds: Normal breath sounds. No wheezing.  Abdominal:     General: Bowel sounds are normal. There is no distension.     Palpations: Abdomen is soft.     Tenderness: There is no abdominal tenderness.  Musculoskeletal:        General: Normal range of motion.     Cervical back: Normal range of motion.  Skin:    General: Skin is warm and dry.      Findings: No rash.  Neurological:     Mental Status: She is alert and oriented to person, place, and time.     Gait: Gait is intact.  Psychiatric:        Mood and Affect: Mood and affect normal.        Cognition and Memory: Memory normal.  Judgment: Judgment normal.      I spent 20 minutes dedicated to the care of this patient (face-to-face and non-face-to-face) on the date of the encounter to include what is described in the assessment and plan.,  Delon Hope, NP 12/26/2024 1:41 PM "

## 2024-12-26 NOTE — Assessment & Plan Note (Signed)
 Patient has acute on chronic cough. Reports cough initially started after she received Imfinzi  for lung cancer treatment several years ago. Over the past 6 months, cough has significantly worsened.  She was on steroids and antibiotics about 2 months ago. Reports coughing up green/yellow phlegm. Has been coughing so hard it makes her feel like she might blackout. We discussed a long steroid taper along with azithromycin . Will reach out to Dr. [name] per above. Patient to let me know if symptoms worsen.

## 2024-12-26 NOTE — Assessment & Plan Note (Addendum)
-   Denies any recent infections. - Reviewed CT chest (11/27/24): Enlarging 1.1 x 0.8 cm spiculated anterior left upper lobe pulmonary nodule suspicious for contralateral recurrence; recommend PET scan and tissue sampling for further evaluation.  Stable right upper lobe posttreatment changes without further new suspicious thoracic disease. - PET scan from 12/13/2024 showed nodule of concern within the left upper lobe measures 1.0 cm which has increased in size and degree of FDG uptake compared to prior exam.  New 4 mm subpleural nodule in the periphery of the left upper lobe with SUV max of 2.6.  Interval increased tracer within the left hilum with SUV of 5.1 compared with 3.2 previously.  Suspicious for residual or recurrent tumor within the left hilum.  Right paratracheal lymph node with SUV max of 4.9 previously 2.6.  Suspicious for nodal metastasis.  Within the area of right hilar radiation change there is a 6 mm focus of increased radiotracer to the right mainstream bronchus with SUV max of 4.7 previously 3.8.  Trace left pleural effusion that is new from previous exam.  New asymmetric increase uptake in the tonsillar region with SUV max of 6.4 etiology indeterminate.  Correlation with direct visualization is advised. - Discussed she will need a biopsy.  Will reach out to Dr. [name] to see if there is somebody in particular he would like her to be seen by. -In the interim, will get MRI of her brain -Patient to follow-up with Dr. Davonna after biopsy MRI of brain.

## 2024-12-27 ENCOUNTER — Other Ambulatory Visit: Payer: Self-pay | Admitting: Internal Medicine

## 2024-12-28 ENCOUNTER — Telehealth: Payer: Self-pay | Admitting: Pulmonary Disease

## 2024-12-28 DIAGNOSIS — R911 Solitary pulmonary nodule: Secondary | ICD-10-CM | POA: Insufficient documentation

## 2024-12-28 NOTE — Telephone Encounter (Signed)
 They have the day 01/08/25 not 01/01/25 which will it really be

## 2024-12-28 NOTE — Telephone Encounter (Signed)
 Patient aware of Bronchoscopy and Ct scan. date and time 01/01/2025 11:00 am. CT Super D Chest 10:00 am.

## 2024-12-28 NOTE — Telephone Encounter (Signed)
 I spoke with patient on the phone regarding navigation bronchoscopy and EBUS procedures. She has had them done before and understands the risks. She has agreed to move forward with procedures.   Based on availability, she will be scheduled at [facility].  She expressed concern for transportation issues and will work on getting a ride.  Dorn Chill, MD [facility] Office: [phone]   See Amion for personal pager PCCM on call pager [phone] until 7pm. Please call Elink 7p-7a. [phone]

## 2024-12-28 NOTE — Telephone Encounter (Signed)
 Scheduling RANB with Dr. Isadora.   Darrin Barn, MD [facility] 12/28/2024 10:02 AM

## 2024-12-31 NOTE — Telephone Encounter (Signed)
 Noted. NFN

## 2024-12-31 NOTE — Telephone Encounter (Signed)
 I didn't see any codes so I went with 68377, [id number], [id number], [id number], [id number], [phone] Prior Auth Not Required Refer # [id number]

## 2025-01-01 ENCOUNTER — Encounter
Admission: RE | Disposition: A | Payer: Self-pay | Source: Home / Self Care | Attending: Student in an Organized Health Care Education/Training Program

## 2025-01-01 ENCOUNTER — Ambulatory Visit

## 2025-01-01 ENCOUNTER — Other Ambulatory Visit: Payer: Self-pay

## 2025-01-01 ENCOUNTER — Ambulatory Visit: Admission: RE | Admit: 2025-01-01 | Discharge: 2025-01-01 | Attending: Pulmonary Disease

## 2025-01-01 ENCOUNTER — Ambulatory Visit
Admission: RE | Admit: 2025-01-01 | Discharge: 2025-01-01 | Disposition: A | Discharge status: Home / Self Care | Attending: Student in an Organized Health Care Education/Training Program | Admitting: Student in an Organized Health Care Education/Training Program

## 2025-01-01 ENCOUNTER — Encounter: Payer: Self-pay | Admitting: Student in an Organized Health Care Education/Training Program

## 2025-01-01 DIAGNOSIS — K219 Gastro-esophageal reflux disease without esophagitis: Secondary | ICD-10-CM | POA: Diagnosis not present

## 2025-01-01 DIAGNOSIS — Z801 Family history of malignant neoplasm of trachea, bronchus and lung: Secondary | ICD-10-CM | POA: Insufficient documentation

## 2025-01-01 DIAGNOSIS — Z9221 Personal history of antineoplastic chemotherapy: Secondary | ICD-10-CM | POA: Insufficient documentation

## 2025-01-01 DIAGNOSIS — Z01810 Encounter for preprocedural cardiovascular examination: Secondary | ICD-10-CM | POA: Diagnosis not present

## 2025-01-01 DIAGNOSIS — J4489 Other specified chronic obstructive pulmonary disease: Secondary | ICD-10-CM | POA: Diagnosis not present

## 2025-01-01 DIAGNOSIS — Z85118 Personal history of other malignant neoplasm of bronchus and lung: Secondary | ICD-10-CM | POA: Diagnosis not present

## 2025-01-01 DIAGNOSIS — C3411 Malignant neoplasm of upper lobe, right bronchus or lung: Secondary | ICD-10-CM | POA: Diagnosis not present

## 2025-01-01 DIAGNOSIS — R911 Solitary pulmonary nodule: Secondary | ICD-10-CM

## 2025-01-01 DIAGNOSIS — Z8249 Family history of ischemic heart disease and other diseases of the circulatory system: Secondary | ICD-10-CM | POA: Insufficient documentation

## 2025-01-01 DIAGNOSIS — Z923 Personal history of irradiation: Secondary | ICD-10-CM | POA: Diagnosis not present

## 2025-01-01 DIAGNOSIS — C3412 Malignant neoplasm of upper lobe, left bronchus or lung: Secondary | ICD-10-CM | POA: Insufficient documentation

## 2025-01-01 DIAGNOSIS — Z87891 Personal history of nicotine dependence: Secondary | ICD-10-CM | POA: Diagnosis not present

## 2025-01-01 DIAGNOSIS — C771 Secondary and unspecified malignant neoplasm of intrathoracic lymph nodes: Secondary | ICD-10-CM | POA: Insufficient documentation

## 2025-01-01 DIAGNOSIS — I10 Essential (primary) hypertension: Secondary | ICD-10-CM | POA: Diagnosis not present

## 2025-01-01 HISTORY — PX: VIDEO BRONCHOSCOPY WITH ENDOBRONCHIAL NAVIGATION: SHX6175

## 2025-01-01 MED ORDER — ROCURONIUM BROMIDE 10 MG/ML (PF) SYRINGE
PREFILLED_SYRINGE | INTRAVENOUS | Status: AC
  Filled 2025-01-01: qty 10

## 2025-01-01 MED ORDER — ONDANSETRON HCL 4 MG/2ML IJ SOLN
INTRAMUSCULAR | Status: AC
  Filled 2025-01-01: qty 2

## 2025-01-01 MED ORDER — PROPOFOL 10 MG/ML IV BOLUS
INTRAVENOUS | Status: AC
  Filled 2025-01-01: qty 20

## 2025-01-01 MED ORDER — PROPOFOL 1000 MG/100ML IV EMUL
INTRAVENOUS | Status: AC
  Filled 2025-01-01: qty 100

## 2025-01-01 MED ORDER — LACTATED RINGERS IV SOLN: INTRAVENOUS | Status: DC

## 2025-01-01 MED ORDER — CHLORHEXIDINE GLUCONATE 0.12 % MT SOLN
OROMUCOSAL | Status: AC
  Filled 2025-01-01: qty 15

## 2025-01-01 MED ORDER — MIDAZOLAM HCL 2 MG/2ML IJ SOLN
INTRAMUSCULAR | Status: AC
  Filled 2025-01-01: qty 2

## 2025-01-01 MED ORDER — FENTANYL CITRATE (PF) 100 MCG/2ML IJ SOLN
INTRAMUSCULAR | Status: DC | PRN
  Administered 2025-01-01 (×2): 50 ug via INTRAVENOUS

## 2025-01-01 MED ORDER — ONDANSETRON HCL 4 MG/2ML IJ SOLN: 4.0000 mg | Freq: Once | INTRAMUSCULAR | Status: DC | PRN

## 2025-01-01 MED ORDER — FENTANYL CITRATE (PF) 100 MCG/2ML IJ SOLN: 25.0000 ug | INTRAMUSCULAR | Status: DC | PRN

## 2025-01-01 MED ORDER — PROPOFOL 500 MG/50ML IV EMUL
INTRAVENOUS | Status: DC | PRN
  Administered 2025-01-01: 125 ug/kg/min via INTRAVENOUS

## 2025-01-01 MED ORDER — CHLORHEXIDINE GLUCONATE 0.12 % MT SOLN
15.0000 mL | Freq: Once | OROMUCOSAL | Status: AC
  Administered 2025-01-01: 15 mL via OROMUCOSAL

## 2025-01-01 MED ORDER — PROPOFOL 10 MG/ML IV BOLUS
INTRAVENOUS | Status: DC | PRN
  Administered 2025-01-01: 30 mg via INTRAVENOUS
  Administered 2025-01-01: 50 mg via INTRAVENOUS
  Administered 2025-01-01 (×2): 30 mg via INTRAVENOUS
  Administered 2025-01-01: 150 mg via INTRAVENOUS

## 2025-01-01 MED ORDER — OXYCODONE HCL 5 MG/5ML PO SOLN: 5.0000 mg | Freq: Once | ORAL | Status: DC | PRN

## 2025-01-01 MED ORDER — OXYCODONE HCL 5 MG PO TABS: 5.0000 mg | ORAL_TABLET | Freq: Once | ORAL | Status: DC | PRN

## 2025-01-01 MED ORDER — LIDOCAINE HCL (CARDIAC) PF 100 MG/5ML IV SOSY
PREFILLED_SYRINGE | INTRAVENOUS | Status: DC | PRN
  Administered 2025-01-01 (×2): 50 mg via INTRAVENOUS

## 2025-01-01 MED ORDER — PHENYLEPHRINE 80 MCG/ML (10ML) SYRINGE FOR IV PUSH (FOR BLOOD PRESSURE SUPPORT)
PREFILLED_SYRINGE | INTRAVENOUS | Status: DC | PRN
  Administered 2025-01-01 (×2): 120 ug via INTRAVENOUS

## 2025-01-01 MED ORDER — ORAL CARE MOUTH RINSE: 15.0000 mL | Freq: Once | OROMUCOSAL | Status: AC

## 2025-01-01 MED ORDER — ROCURONIUM BROMIDE 100 MG/10ML IV SOLN
INTRAVENOUS | Status: DC | PRN
  Administered 2025-01-01: 60 mg via INTRAVENOUS

## 2025-01-01 MED ORDER — MIDAZOLAM HCL (PF) 2 MG/2ML IJ SOLN
INTRAMUSCULAR | Status: DC | PRN
  Administered 2025-01-01: 2 mg via INTRAVENOUS

## 2025-01-01 MED ORDER — DEXAMETHASONE SOD PHOSPHATE PF 10 MG/ML IJ SOLN
INTRAMUSCULAR | Status: DC | PRN
  Administered 2025-01-01: 10 mg via INTRAVENOUS

## 2025-01-01 MED ORDER — LIDOCAINE HCL (PF) 2 % IJ SOLN
INTRAMUSCULAR | Status: AC
  Filled 2025-01-01: qty 5

## 2025-01-01 MED ORDER — FENTANYL CITRATE (PF) 100 MCG/2ML IJ SOLN
INTRAMUSCULAR | Status: AC
  Filled 2025-01-01: qty 2

## 2025-01-01 MED ORDER — ONDANSETRON HCL 4 MG/2ML IJ SOLN
INTRAMUSCULAR | Status: DC | PRN
  Administered 2025-01-01: 4 mg via INTRAVENOUS

## 2025-01-01 NOTE — Progress Notes (Signed)
 Chest film read by Dr. Isadora, ok for discharge, provider will follow up with patient.

## 2025-01-01 NOTE — Anesthesia Preprocedure Evaluation (Signed)
 "                                  Anesthesia Evaluation  Patient identified by MRN, date of birth, ID band Patient awake    Reviewed: Allergy  & Precautions, NPO status , Patient's Chart, lab work & pertinent test results  Airway Mallampati: II  TM Distance: >3 FB Neck ROM: Full    Dental  (+) Edentulous Upper, Edentulous Lower   Pulmonary neg pulmonary ROS, COPD,  COPD inhaler, former smoker   Pulmonary exam normal  + decreased breath sounds      Cardiovascular Exercise Tolerance: Poor hypertension, Pt. on medications + DOE  negative cardio ROS Normal cardiovascular exam Rhythm:Regular Rate:Normal     Neuro/Psych  Headaches  Anxiety     negative neurological ROS  negative psych ROS   GI/Hepatic negative GI ROS, Neg liver ROS,GERD  ,,  Endo/Other  negative endocrine ROS    Renal/GU negative Renal ROS  negative genitourinary   Musculoskeletal   Abdominal   Peds negative pediatric ROS (+)  Hematology negative hematology ROS (+) Blood dyscrasia, anemia   Anesthesia Other Findings Past Medical History: No date: Anxiety No date: Arthritis No date: Chronic bronchitis (HCC) No date: COPD (chronic obstructive pulmonary disease) (HCC) No date: Essential hypertension No date: GERD (gastroesophageal reflux disease) No date: Headache No date: History of pneumonia No date: Pneumonitis No date: Primary cancer of right upper lobe of lung (HCC)     Comment:  Stage IIIb adenocarcinoma  Past Surgical History: No date: ABDOMINAL HYSTERECTOMY No date: APPENDECTOMY     Comment:  when had hysterectomy No date: CARPAL TUNNEL RELEASE; Bilateral 08/23/2016: ENDOBRONCHIAL ULTRASOUND; Bilateral     Comment:  Procedure: ENDOBRONCHIAL ULTRASOUND;  Surgeon: Lamar GORMAN Chris, MD;  Location: [facility];  Service:               Cardiopulmonary;  Laterality: Bilateral; No date: HERNIA REPAIR     Comment:  left inguinal hernia age 16 11/02/2021: PORT-A-CATH  REMOVAL; Left     Comment:  Procedure: MINOR REMOVAL PORT-A-CATH;  Surgeon: Mavis Anes, MD;  Location: [facility];  Service: General;                Laterality: Left;  pt to arrive at 8:30 09/17/2016: PORTACATH PLACEMENT; Left     Comment:  Procedure: INSERTION PORT-A-CATH LEFT SUBCLAVIAN;                Surgeon: Anes Mavis, MD;  Location: [facility];  Service:               General;  Laterality: Left; No date: TUBAL LIGATION  BMI    Body Mass Index: 26.37 kg/m      Reproductive/Obstetrics negative OB ROS                              Anesthesia Physical Anesthesia Plan  ASA: 3  Anesthesia Plan: General   Post-op Pain Management:    Induction: Intravenous  PONV Risk Score and Plan: Ondansetron , Dexamethasone , Midazolam  and Treatment may vary due to age or medical condition  Airway Management Planned: Oral ETT  Additional Equipment:   Intra-op Plan:   Post-operative Plan: Extubation  in OR  Informed Consent: I have reviewed the patients History and Physical, chart, labs and discussed the procedure including the risks, benefits and alternatives for the proposed anesthesia with the patient or authorized representative who has indicated his/her understanding and acceptance.     Dental Advisory Given  Plan Discussed with: CRNA  Anesthesia Plan Comments:         Anesthesia Quick Evaluation  "

## 2025-01-01 NOTE — Transfer of Care (Signed)
 Immediate Anesthesia Transfer of Care Note  Patient: Emily Pope  Procedure(s) Performed: VIDEO BRONCHOSCOPY WITH ENDOBRONCHIAL NAVIGATION (Left)  Patient Location: PACU  Anesthesia Type:General  Level of Consciousness: awake and alert   Airway & Oxygen  Therapy: Patient Spontanous Breathing and Patient connected to face mask oxygen   Post-op Assessment: Report given to RN and Post -op Vital signs reviewed and stable  Post vital signs: Reviewed and stable  Last Vitals:  Vitals Value Taken Time  BP 151/93 01/01/25 12:22  Temp    Pulse 104 01/01/25 12:27  Resp 26 01/01/25 12:27  SpO2 100 % 01/01/25 12:27  Vitals shown include unfiled device data.  Last Pain:  Vitals:   01/01/25 1038  PainSc: 0-No pain         Complications: No notable events documented.

## 2025-01-01 NOTE — Op Note (Signed)
 Video Bronchoscopy with Robotic Assisted Bronchoscopic Navigation   Date of Operation: 01/01/2025   Pre-op Diagnosis: lung nodule, lung adenoca  Post-op Diagnosis: same  Surgeon: Belva November, MD  Anesthesia: General endotracheal anesthesia  Operation: Flexible video fiberoptic bronchoscopy with robotic assistance and biopsies.  Estimated Blood Loss: Minimal  Complications: None  Indications and History: Emily Pope is a 69 y.o. female with history of RUL stage III lung adeno Ca presenting with a LUL nodule.  Recommendation made to achieve a tissue diagnosis via robotic assisted navigational bronchoscopy.  The risks, benefits, complications, treatment options and expected outcomes were discussed with the patient.  The possibilities of pneumothorax, pneumonia, reaction to medication, pulmonary aspiration, perforation of a viscus, bleeding, failure to diagnose a condition and creating a complication requiring transfusion or operation were discussed with the patient who freely signed the consent.    Description of Procedure: The patient was seen in the Preoperative Area, was examined and was deemed appropriate to proceed.  The patient was taken to [facility] Procedure room, identified as Emily Pope and the procedure verified as Flexible Video Fiberoptic Bronchoscopy.  A Time Out was held and the above information confirmed.   Prior to the date of the procedure a high-resolution CT scan of the chest was performed. Utilizing ION software program a virtual tracheobronchial tree was generated to allow the creation of distinct navigation pathways to the patient's parenchymal abnormalities. After being taken to the operating room general anesthesia was initiated and the patient  was orally intubated. The video fiberoptic bronchoscope was introduced via the endotracheal tube and a general inspection was performed which showed normal right and left lung anatomy. Aspiration of the bilateral mainstems  was completed to remove any remaining secretions. Robotic catheter inserted into patient's endotracheal tube.   Target #1 LUL nodule: The distinct navigation pathways prepared prior to this procedure were then utilized to navigate to patient's lesion identified on CT scan. The robotic catheter was secured into place and the vision probe was withdrawn.  Lesion location was approximated using fluoroscopy.  Local registration and targeting was performed using GE 3D OEC mobile C-arm three-dimensional imaging. Under fluoroscopic guidance transbronchial needle brushings, transbronchial needle biopsies, and transbronchial forceps biopsies were performed to be sent for cytology, pathology, and culture.  Needle-in-lesion was confirmed using mobile C-arm.  A bronchioalveolar lavage was performed in the LUL and sent for cytology.   Tool in lesion:    Target #2 EBUS:  The EBUS bronchoscope was introduced, and the hilar and mediastinal lymph node stations were examined. Stations 11L, 4R, and right hilar station (? Mass vs lymph node) were biopsied with a 21G Olympus ViziShot 2 needle. Multiple passes were obtained and sent for cytology.  At the end of the procedure a general airway inspection was performed and there was no evidence of active bleeding. The airway mucosa was noted to be friable with radiation changes noted. The bronchoscope was removed.  The patient tolerated the procedure well. There was no significant blood loss and there were no obvious complications. A post-procedural chest x-ray is pending.  Samples Target #1: LUL nodule 1. Transbronchial needle brushings from LUL nodule 2. Transbronchial Wang needle biopsies from LUL nodule 3. Transbronchial forceps biopsies from LUL nodule 4. Bronchoalveolar lavage from LUL   Samples Target #2: EBUS to stations 11L, 4R, and Right hilum  Plans:  The patient will be discharged from the PACU to home when recovered from anesthesia and after chest x-ray  is reviewed. We will review  the cytology, pathology and microbiology results with the patient when they become available. Outpatient followup will be with oncology and Dr. [name].  Belva November, MD [facility] 01/01/2025 12:19 PM

## 2025-01-01 NOTE — Anesthesia Postprocedure Evaluation (Signed)
"   Anesthesia Post Note  Patient: Emily Pope  Procedure(s) Performed: VIDEO BRONCHOSCOPY WITH ENDOBRONCHIAL NAVIGATION (Left)  Patient location during evaluation: PACU Anesthesia Type: General Level of consciousness: awake and alert Pain management: satisfactory to patient Vital Signs Assessment: post-procedure vital signs reviewed and stable Respiratory status: spontaneous breathing Cardiovascular status: stable Anesthetic complications: no   No notable events documented.   Last Vitals:  Vitals:   01/01/25 1300 01/01/25 1321  BP: (!) 154/99 (!) 155/96  Pulse: 99 98  Resp: (!) 25 (!) 24  Temp: 36.7 C (!) 36.3 C  SpO2: 94% 96%    Last Pain:  Vitals:   01/01/25 1321  PainSc: 0-No pain                 VAN STAVEREN,Evaleen Sant      "

## 2025-01-01 NOTE — Anesthesia Procedure Notes (Signed)
 Procedure Name: Intubation Date/Time: 01/01/2025 11:11 AM  Performed by: Trudy Rankin LABOR, CRNAPre-anesthesia Checklist: Patient identified, Patient being monitored, Timeout performed, Emergency Drugs available and Suction available Patient Re-evaluated:Patient Re-evaluated prior to induction Oxygen  Delivery Method: Circle System Utilized Preoxygenation: Pre-oxygenation with 100% oxygen  Induction Type: IV induction and Rapid sequence Laryngoscope Size: Mac, 3 and McGrath Grade View: Grade I Tube type: Oral Tube size: 8.5 mm Number of attempts: 1 Airway Equipment and Method: Stylet Placement Confirmation: ETT inserted through vocal cords under direct vision, positive ETCO2 and breath sounds checked- equal and bilateral Secured at: 19 cm Tube secured with: Tape Dental Injury: Teeth and Oropharynx as per pre-operative assessment

## 2025-01-01 NOTE — H&P (Signed)
 "   Assessment & Plan:   #LUL nodule #Stage III NSCLCa (Adeno)  The patient is here to discuss their imaging abnormalities which include a LUL FDG avid nodule with avid hilar and mediastinal lymph nodes in the setting of known history of lung adenoCa (RUL in 2017) s/p chemoradiation.  We discussed the importance of diagnosis and staging in lung malignancies, and the approach to obtaining a tissue diagnosis which would include robotic assisted navigational bronchoscopy with endobronchial ultrasound guided sampling.  We also discussed the risks associated with the procedure which include a 2% risk of pneumothorax, infection, bleeding, and nondiagnostic procedure in detail.  I explained that patients typically are able to return home the same day of the procedure, but in rare cases admission to the hospital for observation and treatment is required.  After our discussion, the patient elected to proceed with the procedure   Belva November, MD [facility]   Meds ordered this encounter  Medications   lactated ringers  infusion   OR Linked Order Group    chlorhexidine  (PERIDEX ) 0.12 % solution 15 mL    Oral care mouth rinse    Current Medications[1]   Subjective:   PATIENT ID: Emily Pope GENDER: female DOB: November 06, 1956, MRN: [id number]  No chief complaint on file.   HPI  69 year old female with history of lung cancer s/p chemotherapy and radiation who presents for the evaluation of a LUL nodule.  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 She has a history of durvalumab  induced pneumonitis in 2018.  Initial tumor was a hypermetabolic 7.9 cm RUL lung mass, diagnosed with bronchoscopy 07/2016 as adenoca. Disease responded to treated with repeat imaging in 2019 showing reduction in FDG activity and surveillance imaging showing post radiation changes. She had repeat imaging with  a nodule noted in the LUL, with further surveillance showing it to increase in size. Repeat PET/CT showed the nodule to be FDG avid, as well as findings of FDG avid hilar and mediastinal lymph nodes.  Patient is referred to us  for repeat bronchscopy for tissue acquisition.  Today, she has no symptoms and feels at baseline. She is anxious about her diagnosis. No worsening cough, chest pain, or shortness of breath. She reports having had a history of pneumothorax in the past.  Ancillary information including prior medications, full medical/surgical/family/social histories, and PFTs (when available) are listed below and have been reviewed.    Review of Systems  Constitutional:  Negative for chills and fever.  Respiratory:  Negative for cough, hemoptysis, sputum production, shortness of breath and wheezing.   Cardiovascular:  Negative for chest pain.     Objective:   Vitals:   01/01/25 1038  BP: (!) 156/108  Pulse: (!) 119  Resp: (!) 24  Temp: 97.9 F (36.6 C)  SpO2: 95%  Weight: 61.2 kg  Height: 5' (1.524 m)   95% on RA  BMI Readings from Last 3 Encounters:  01/01/25 26.37 kg/m  12/04/24 25.58 kg/m  10/12/24 27.32 kg/m   Wt Readings from Last 3 Encounters:  01/01/25 61.2 kg  12/04/24 59.4 kg  10/12/24 61.9 kg    Physical Exam Cardiovascular:     Rate and Rhythm: Normal rate and regular rhythm.     Pulses: Normal pulses.     Heart sounds: Normal heart sounds.  Pulmonary:     Effort: Pulmonary effort is normal. No respiratory distress.     Breath  sounds: Normal breath sounds. No wheezing or rales.  Neurological:     General: No focal deficit present.     Mental Status: She is alert and oriented to person, place, and time. Mental status is at baseline.       Ancillary Information    Past Medical History:  Diagnosis Date   Anxiety    Arthritis    Chronic bronchitis (HCC)    COPD (chronic obstructive pulmonary disease) (HCC)    Essential hypertension     GERD (gastroesophageal reflux disease)    Headache    History of pneumonia    Pneumonitis    Primary cancer of right upper lobe of lung (HCC)    Stage IIIb adenocarcinoma     Family History  Problem Relation Age of Onset   Hypertension Mother    Lung cancer Mother    Hypertension Father    Diabetes Father    Hypertension Sister    Hypertension Sister    Lung cancer Brother    Colon cancer Neg Hx    Breast cancer Neg Hx      Past Surgical History:  Procedure Laterality Date   ABDOMINAL HYSTERECTOMY     APPENDECTOMY     when had hysterectomy   CARPAL TUNNEL RELEASE Bilateral    ENDOBRONCHIAL ULTRASOUND Bilateral 08/23/2016   Procedure: ENDOBRONCHIAL ULTRASOUND;  Surgeon: Lamar GORMAN Chris, MD;  Location: [facility];  Service: Cardiopulmonary;  Laterality: Bilateral;   HERNIA REPAIR     left inguinal hernia age 89   PORT-A-CATH REMOVAL Left 11/02/2021   Procedure: MINOR REMOVAL PORT-A-CATH;  Surgeon: Mavis Anes, MD;  Location: [facility];  Service: General;  Laterality: Left;  pt to arrive at 8:30   PORTACATH PLACEMENT Left 09/17/2016   Procedure: INSERTION PORT-A-CATH LEFT SUBCLAVIAN;  Surgeon: Anes Mavis, MD;  Location: [facility];  Service: General;  Laterality: Left;   TUBAL LIGATION      Social History   Socioeconomic History   Marital status: Divorced    Spouse name: Not on file   Number of children: Not on file   Years of education: Not on file   Highest education level: Not on file  Occupational History   Not on file  Tobacco Use   Smoking status: Former    Current packs/day: 0.00    Average packs/day: 2.0 packs/day for 43.0 years (86.0 ttl pk-yrs)    Types: Cigarettes    Start date: 07/20/1973    Quit date: 07/20/2016    Years since quitting: 8.4   Smokeless tobacco: Never  Vaping Use   Vaping status: Never Used  Substance and Sexual Activity   Alcohol use: No   Drug use: No   Sexual activity: Not on file  Other Topics Concern   Not on file  Social History  Narrative   Not on file   Social Drivers of Health   Tobacco Use: Medium Risk (01/01/2025)   Patient History    Smoking Tobacco Use: Former    Smokeless Tobacco Use: Never    Passive Exposure: Not on Actuary Strain: Low Risk (11/06/2023)   Received from [facility]   Overall Financial Resource Strain (CARDIA)    Difficulty of Paying Living Expenses: Not hard at all  Food Insecurity: No Food Insecurity (11/06/2023)   Received from [facility]   Epic    Within the past 12 months, you worried that your food would run out before you got the money to buy  more.: Never true    Within the past 12 months, the food you bought just didn't last and you didn't have money to get more.: Never true  Transportation Needs: No Transportation Needs (11/06/2023)   Received from [facility] - Transportation    Lack of Transportation (Medical): No    Lack of Transportation (Non-Medical): No  Physical Activity: Insufficiently Active (11/06/2023)   Received from [facility]   Exercise Vital Sign    On average, how many days per week do you engage in moderate to strenuous exercise (like a brisk walk)?: 2 days    On average, how many minutes do you engage in exercise at this level?: 10 min  Stress: No Stress Concern Present (11/06/2023)   Received from [facility] - Occupational Stress Questionnaire    Feeling of Stress : Not at all  Social Connections: Socially Isolated (11/06/2023)   Received from [facility]   Social Connection and Isolation Panel    In a typical week, how many times do you talk on the phone with family, friends, or neighbors?: More than three times a week    How often do you get together with friends or relatives?: More than three times a week    How often do you attend church or religious services?: Never    Do you belong to any clubs or organizations such as church groups, unions, fraternal  or athletic groups, or school groups?: No    How often do you attend meetings of the clubs or organizations you belong to?: Never    Are you married, widowed, divorced, separated, never married, or living with a partner?: Widowed  Intimate Partner Violence: Not At Risk (11/06/2023)   Received from [facility]   Epic    Within the last year, have you been afraid of your partner or ex-partner?: No    Within the last year, have you been humiliated or emotionally abused in other ways by your partner or ex-partner?: No    Within the last year, have you been kicked, hit, slapped, or otherwise physically hurt by your partner or ex-partner?: No    Within the last year, have you been raped or forced to have any kind of sexual activity by your partner or ex-partner?: No  Depression (PHQ2-9): Medium Risk (12/26/2024)   Depression (PHQ2-9)    PHQ-2 Score: 5  Alcohol Screen: Not on file  Housing: Not on file  Utilities: Low Risk (11/06/2023)   Received from [facility]   Utilities    Within the past 12 months, have you been unable to get utilities(heat, electricity) when it was really needed?: No  Health Literacy: Medium Risk (11/06/2023)   Received from [facility] Literacy    How often do you need to have someone help you when you read instructions, pamphlets, or other written material from your doctor or pharmacy?: Sometimes     Allergies[2]   CBC    Component Value Date/Time   WBC 8.8 11/27/2024 0834   RBC 3.37 (L) 11/27/2024 0834   HGB 11.2 (L) 11/27/2024 0834   HGB 11.7 01/23/2024 1023   HGB 12.9 09/02/2016 1403   HCT 34.6 (L) 11/27/2024 0834   HCT 34.8 01/23/2024 1023   HCT 38.4 09/02/2016 1403   PLT 328 11/27/2024 0834   PLT 401 01/23/2024 1023   MCV 102.7 (H) 11/27/2024 0834   MCV  102 (H) 01/23/2024 1023   MCV 94.6 09/02/2016 1403   MCH 33.2 11/27/2024 0834   MCHC 32.4 11/27/2024 0834   RDW 11.9 11/27/2024 0834   RDW 12.3 01/23/2024 1023   RDW 13.6  09/02/2016 1403   LYMPHSABS 1.3 11/27/2024 0834   LYMPHSABS 1.2 01/23/2024 1023   LYMPHSABS 2.5 09/02/2016 1403   MONOABS 0.8 11/27/2024 0834   MONOABS 0.7 09/02/2016 1403   EOSABS 0.3 11/27/2024 0834   EOSABS 0.3 01/23/2024 1023   BASOSABS 0.1 11/27/2024 0834   BASOSABS 0.1 01/23/2024 1023   BASOSABS 0.1 09/02/2016 1403    Pulmonary Functions Testing Results:    Latest Ref Rng & Units 02/22/2023    9:41 AM 09/16/2017   10:22 AM  PFT Results  FVC-Pre L 2.43  2.02   FVC-Predicted Pre % 91  71   FVC-Post L 2.34  1.83   FVC-Predicted Post % 88  65   Pre FEV1/FVC % % 80  89   Post FEV1/FCV % % 78  80   FEV1-Pre L 1.95  1.79   FEV1-Predicted Pre % 96  82   FEV1-Post L 1.82  1.46   DLCO uncorrected ml/min/mmHg 8.95  10.56   DLCO UNC% % 52  55   DLVA Predicted % 67  68   TLC L 4.83  3.69   TLC % Predicted % 108  82   RV % Predicted % 133  109        [1]  Current Facility-Administered Medications:    chlorhexidine  (PERIDEX ) 0.12 % solution 15 mL, 15 mL, Mouth/Throat, Once **OR** Oral care mouth rinse, 15 mL, Mouth Rinse, Once, Fleeta Blush, Arno FALCON, MD   lactated ringers  infusion, , Intravenous, Continuous, Van Staveren, Gijsbertus F, MD [2]  Allergies Allergen Reactions   Clindamycin/Lincomycin Rash   Codeine Anaphylaxis   Lincomycin Rash    Also Clindamycin as it has lincomycin in it Also Clindamycin as it has lincomycin in it   Other Rash   Penicillins Anaphylaxis    Has patient had a PCN reaction causing immediate rash, facial/tongue/throat swelling, SOB or lightheadedness with hypotension: Unknown Has patient had a PCN reaction causing severe rash involving mucus membranes or skin necrosis: Unknown Has patient had a PCN reaction that required hospitalization: No Has patient had a PCN reaction occurring within the last 10 years: No If all of the above answers are NO, then may proceed with Cephalosporin use. Has patient had a PCN reaction causing immediate  rash, facial/tongue/throat swelling, SOB or lightheadedness with hypotension: Unknown Has patient had a PCN reaction causing severe rash involving mucus membranes or skin necrosis: Unknown Has patient had a PCN reaction that required hospitalization: No Has patient had a PCN reaction occurring within the last 10 years: No If all of the above answers are NO, then may proceed with Cephalosporin use.    Vancomycin  Itching    Scalp itching   Gabapentin      Could not sleep and blood pressure bottomed out    Sulfa Antibiotics Nausea Only   Hydrocodone  Rash   Levofloxacin  Other (See Comments)    Caused mouth dryness   Tizanidine     Other Reaction(s): Other (See Comments)  Dry mouth   "

## 2025-01-01 NOTE — Procedures (Signed)
 Procedure Note  Patient: Emily Pope  GE 3D [facility] mobile C-arm was utilized to identify and biopsy a LUL nodule.  Needle-in-lesion was confirmed using real-time GE 3D OEC imaging, and images were uploaded to PACS.     Belva November, MD [facility] 01/01/2025 12:21 PM

## 2025-01-02 ENCOUNTER — Ambulatory Visit: Payer: Self-pay | Admitting: Internal Medicine

## 2025-01-02 ENCOUNTER — Encounter: Payer: Self-pay | Admitting: Student in an Organized Health Care Education/Training Program

## 2025-01-02 NOTE — Telephone Encounter (Signed)
 FYI Only or Action Required?: Action required by provider: update on patient condition.  Patient is followed in Pulmonology for copd/lung biopsy, last seen on 08/16/2024 by [name] Ozell NOVAK, MD.  Called Nurse Triage reporting Cough.  Symptoms began today.  Interventions attempted: Nothing.  Symptoms are: stable.  Triage Disposition: Home Care  Patient/caregiver understands and will follow disposition?: Yes  RN gave detailed instructions to return to the Er with any continued episodes  Copied from CRM #[id number]. Topic: Clinical - Red Word Triage >> Jan 02, 2025  4:50 PM Rilla B wrote: Red Word that prompted transfer to Nurse Triage: Had lung biopsy on yesterday and has cough up blood clot. Reason for Disposition  Coughing up blood (all other triage questions negative)  Answer Assessment - Initial Assessment Questions Lung biopsy yesterday was coughing up blood yesterday while here but she states by the time she got home it had pretty much stopped. Did a breathing treatment about 30 minutes prior to this event, had some hard coughing and coughed up one dark red (not bright red) clot, flat about the size of a marble. States about a 1/4 of a tablespoon. RN gave home care instructions and strict instructions on when to return to the Er. Pt stated understanding. She denies any other changes to the higher acuity questions, nothing else abnormal for her.      1. ONSET: When did the cough begin?      today 2. SEVERITY: How bad is the cough today? Did the blood appear after a coughing spell?      Yes after coughing spell 3. SPUTUM: Describe the color of your sputum (e.g., none, dry cough; clear, white, yellow, green)     Just the one small clot 4. HEMOPTYSIS: How much blood? (e.g., flecks, streaks, tablespoons)     1/4 of a tablespoon or less 5. DIFFICULTY BREATHING: Are you having difficulty breathing? If Yes, ask: How bad is it? (e.g., mild, moderate, severe)      Denies  any worse than normal 6. FEVER: Do you have a fever? If Yes, ask: What is your temperature, how was it measured, and when did it start?     denies 7. CARDIAC HISTORY: Do you have any history of heart disease? (e.g., heart attack, congestive heart failure)      htn 8. LUNG HISTORY: Do you have any history of lung disease?  (e.g., pulmonary embolus, asthma, emphysema)     yes 9. PE RISK FACTORS: Do you have a history of blood clots? Note: Other risk factors include recent major surgery, recent prolonged travel, being bedridden.     Recent lung biopsy 10. OTHER SYMPTOMS: Do you have any other symptoms? (e.g., runny nose, wheezing, chest pain)       Denies any changes  Protocols used: Coughing Up Blood-A-AH

## 2025-01-03 ENCOUNTER — Ambulatory Visit: Payer: Self-pay | Admitting: Student in an Organized Health Care Education/Training Program

## 2025-01-03 ENCOUNTER — Other Ambulatory Visit: Payer: Self-pay | Admitting: Internal Medicine

## 2025-01-03 LAB — ACID FAST SMEAR (AFB, MYCOBACTERIA): Acid Fast Smear: NEGATIVE

## 2025-01-03 LAB — CYTOLOGY - NON PAP

## 2025-01-03 LAB — SURGICAL PATHOLOGY

## 2025-01-03 NOTE — Telephone Encounter (Signed)
 I called and spoke with patient, advised her of recommendations per Dr. [name].  She verified understanding.  Nothing further needed.

## 2025-01-03 NOTE — Telephone Encounter (Signed)
 Dr. [name], Please advise.  Thank you.

## 2025-01-04 ENCOUNTER — Other Ambulatory Visit: Payer: Self-pay

## 2025-01-04 DIAGNOSIS — C3411 Malignant neoplasm of upper lobe, right bronchus or lung: Secondary | ICD-10-CM

## 2025-01-04 NOTE — Progress Notes (Signed)
 PDL1 requested on [id number] per Dr. Davonna

## 2025-01-06 LAB — AEROBIC/ANAEROBIC CULTURE W GRAM STAIN (SURGICAL/DEEP WOUND)
Culture: NO GROWTH
Gram Stain: NONE SEEN

## 2025-01-07 ENCOUNTER — Ambulatory Visit (HOSPITAL_COMMUNITY)
Admission: RE | Admit: 2025-01-07 | Discharge: 2025-01-07 | Disposition: A | Discharge status: Home / Self Care | Source: Ambulatory Visit | Attending: Oncology | Admitting: Oncology

## 2025-01-07 ENCOUNTER — Inpatient Hospital Stay: Attending: Hematology

## 2025-01-07 DIAGNOSIS — C3411 Malignant neoplasm of upper lobe, right bronchus or lung: Secondary | ICD-10-CM

## 2025-01-07 DIAGNOSIS — Z85118 Personal history of other malignant neoplasm of bronchus and lung: Secondary | ICD-10-CM | POA: Insufficient documentation

## 2025-01-07 LAB — MISCELLANEOUS TEST

## 2025-01-07 MED ORDER — GADOBUTROL 1 MMOL/ML IV SOLN
6.0000 mL | Freq: Once | INTRAVENOUS | Status: AC | PRN
  Administered 2025-01-07: 6 mL via INTRAVENOUS

## 2025-01-17 ENCOUNTER — Inpatient Hospital Stay: Admitting: Oncology

## 2025-01-17 ENCOUNTER — Encounter: Payer: Self-pay | Admitting: Internal Medicine

## 2025-01-17 ENCOUNTER — Ambulatory Visit (INDEPENDENT_AMBULATORY_CARE_PROVIDER_SITE_OTHER): Admitting: Internal Medicine

## 2025-01-17 VITALS — BP 144/97 | HR 102 | Temp 99.6°F | Resp 19 | Ht 60.0 in | Wt 134.4 lb

## 2025-01-17 VITALS — BP 107/74 | HR 96 | Ht 60.0 in | Wt 132.0 lb

## 2025-01-17 DIAGNOSIS — C3492 Malignant neoplasm of unspecified part of left bronchus or lung: Secondary | ICD-10-CM | POA: Insufficient documentation

## 2025-01-17 DIAGNOSIS — R058 Other specified cough: Secondary | ICD-10-CM

## 2025-01-17 DIAGNOSIS — C3411 Malignant neoplasm of upper lobe, right bronchus or lung: Secondary | ICD-10-CM | POA: Diagnosis not present

## 2025-01-17 DIAGNOSIS — J449 Chronic obstructive pulmonary disease, unspecified: Secondary | ICD-10-CM

## 2025-01-17 MED ORDER — BUDESONIDE 0.25 MG/2ML IN SUSP: RESPIRATORY_TRACT | Status: AC

## 2025-01-17 MED ORDER — PREDNISONE 10 MG PO TABS: ORAL_TABLET | ORAL | 0 refills | Status: AC

## 2025-01-17 MED ORDER — TRAMADOL HCL 50 MG PO TABS: 50.0000 mg | ORAL_TABLET | ORAL | 0 refills | Status: AC | PRN

## 2025-01-17 NOTE — Progress Notes (Signed)
 "   Emily Pope, female    DOB: 03-15-1956    MRN: [id number]   Brief patient profile:  53 yowf  MM/quit smoking 2017 Sood pt self referred back to pulmonary clinic in [facility]  09/12/2023 by for copd/ GOLD 0   PFT 02/22/23 >> FEV1 1.95 (96%), Ratio 0.80, TLC 4.83 (108%), RV 2.54 (133%), DLCO 52% and  worse p SABA  Lung ca > RT / chemo completed in Dec 2017 f/u by K  with cough ever since was on Imfinzi    History of Present Illness  09/12/2023  Pulmonary/ 1st Pope eval/ Emily Pope / Emily Pope off trelegy since 09/09/23  Chief Complaint  Patient presents with   Centrilobular emphysema   Dyspnea:  MMRC1 = can walk nl pace, flat grade, can't hurry or go uphills or steps s sob   Cough: sensation of chest congestion creamy x 2017 when finished RT Sleep: level bed /one pillow s resp wakes up dry and thirty  SABA use: once or twice daily  02: none  Rec Pantoprazole  (protonix ) 40 mg   Take  30-60 min before first meal of the day and Pepcid  (famotidine )  20 mg after supper until return to Pope  Only use your albuterol  as a rescue medication  Also  Ok to try albuterol  15 min before an activity (on alternating days)  that you know would usually make you short of breath   For cough/ congestion >   Mucinex dm  up to maximum of  1200 mg every 12 hours and use the flutter valve as much as you can   Prednisone  10 mg take  4 each am x 2 days,   2 each am x 2 days,  1 each am x 2 days and stop      11/30/2023  f/u ov/[place] Pope/Izeah Emily Pope re: GOLD 0/ emphysema  maint on no rx   Chief Complaint  Patient presents with   COPD   Shortness of Breath  Dyspnea:  able to do [employer] on day of ov slow pace  Cough: mostly yellowish just finished levaquin   Sleeping: on side one neck pillow s   resp cc  SABA use: avg  2-3 x per week hfa/ neb not at all  02: none  Rec My Pope will be contacting you by phone for referral to [facility] > neg w/u    Sinus CT 01/11/24  clear paranasal sinuses with  patent sinus drainage pathways. 2. Leftward nasal septal deviation.    07/11/2024  ACUTE ov/Emily Pope/Emily Pope re: cough x 2017  maint on saba/budesonide  and max gerd rx  did  bring meds  Chief Complaint  Patient presents with   Acute Visit    Pt c/o increased cough x 2 wks- prod with thick, dark green sputum.  She gets SOB when she gets into coughing spells.   Dyspnea:  walking in house ok  Cough: severe coughing fits despite h1, mucinex d/ max gerd rx / green mucus not better with abx Sleeping: flat bed one pillow bothered  stuffy nose      SABA use: not helpful  02: none  Rec Prednisone  10 mg take  4 each am x 2 days,   2 each am x 2 days,  1 each am x 2 days and stop   Gabapentin  100 mg one at bedtime for a week, then 1 with bfast    for a week, then add done at supper for a week then 1 at lunch  for week and keep adding one pill a week until cough is gone or reach 1200 mg per refill Take mucinex DM 1200 mg  every 12 hours and supplement if needed with  tramadol  50 mg up to 2 every 4 hours  Once you have eliminated the cough for 3 straight days try reducing the tramadol  first,  then the Mucinex dm  as tolerated.    Please schedule a follow up Pope visit in 6 weeks, call sooner if needed - bring flutter valve     08/16/2024  f/u ov/[place] Pope/Emily Pope re: GOLD 0 COPD/ cc cough x 2017  maint on flutter valve  /mucinex dm  Xyzal in am and 1st gen H1 blockers x 2 at hs / could not tol gabapentn Chief Complaint  Patient presents with   Cough    Coughing is almost gone DOE  Dyspnea:  mostly house bound due to heat > walks dog when cool  Cough: minimal now / non productive/ using ACT  lozenges helped more than anything else but still lots of watery nasal secretions  Sleeping: bed is flat/one pillow some nasal stuffiness but no noct cough  SABA use: neb /bud q hs / then neb prn s bud  02: none  Patient Instructions  Albuterol  with budesonide  twice daily as previous recommended   For cough/ congestion >   mucinex dm  up to maximum of  1200 mg every 12 hours and use the flutter valve as much as you can   My Pope will be contacting you by phone for referral to allergy   Pulmonary follow up is as needed once you have finished with allergy > no additional recs    01/01/25  Bx Squamous cell sca L[facility]   01/17/2025  f/u ov/Emily Pope/Emily Pope re: GOLD 0 COPD/ cc cough x 2017  maint on alb/bud bid  and breztri  Chief Complaint  Patient presents with   COPD    Coughing / shob  Cancer in left lung    Dyspnea:  very limited by breathing / coughing  Cough: shrill harsh dry  Sleeping: on side / 1 pillow wakes up coughing most nights SABA use: duoneb and bud  plus breztri (not my rec)  02: none      No obvious day to day or daytime variability or assoc excess/ purulent sputum or mucus plugs or hemoptysis or cp or chest tightness, subjective wheeze or overt  hb symptoms.    Also denies any obvious fluctuation of symptoms with weather or environmental changes or other aggravating or alleviating factors except as outlined above   No unusual exposure hx or h/o childhood pna/ asthma or knowledge of premature birth.  Current Allergies, Complete Past Medical History, Past Surgical History, Family History, and Social History were reviewed in [employer] record.  ROS  The following are not active complaints unless bolded Hoarseness, sore throat, dysphagia, dental problems, itching, sneezing,  nasal congestion or discharge of excess mucus or purulent secretions, ear ache,   fever, chills, sweats, unintended wt loss or wt gain, classically pleuritic or exertional cp,  orthopnea pnd or arm/hand swelling  or leg swelling, presyncope, palpitations, abdominal pain, anorexia, nausea, vomiting, diarrhea  or change in bowel habits or change in bladder habits, change in stools or change in urine, dysuria, hematuria,  rash, arthralgias, visual complaints, headache,  numbness, weakness or ataxia or problems with walking or coordination,  change in mood or  memory.  Outpatient Medications Prior to Visit - - NOTE:   Unable to verify as accurately reflecting what pt takes    Medication Sig Dispense Refill   acetaminophen  (TYLENOL  8 HOUR ARTHRITIS PAIN) 650 MG CR tablet Take 650 mg by mouth every 8 (eight) hours as needed for pain.     albuterol  (PROVENTIL  HFA;VENTOLIN  HFA) 108 (90 Base) MCG/ACT inhaler Inhale 2 puffs into the lungs every 6 (six) hours as needed for wheezing or shortness of breath. 1 Inhaler 6   albuterol  (PROVENTIL ) (2.5 MG/3ML) 0.083% nebulizer solution Take 3 mLs (2.5 mg total) by nebulization every 4 (four) hours as needed. 75 mL 12   aspirin EC 81 MG tablet Take 1 tablet by mouth daily.     azithromycin  (ZITHROMAX ) 250 MG tablet Take 2 tbs on day 1 and one tab daily until done. 6 each 0   budesonide  (PULMICORT ) 0.25 MG/2ML nebulizer solution Take 2 mLs (0.25 mg total) by nebulization 2 (two) times daily. 120 mL 12   budesonide -glycopyrrolate-formoterol (BREZTRI AEROSPHERE) 160-9-4.8 MCG/ACT AERO inhaler Inhale 2 puffs into the lungs 2 (two) times daily.     buPROPion (WELLBUTRIN XL) 300 MG 24 hr tablet Take 300 mg by mouth daily.     Chlorpheniramine Maleate (CHLOR-TABLETS PO) Take by mouth as needed.     cloNIDine  (CATAPRES ) 0.1 MG tablet Take 1 tablet by mouth twice daily 60 tablet 2   famotidine  (PEPCID ) 20 MG tablet TAKE 1 TABLET BY MOUTH ONCE DAILY AFTER SUPPER 30 tablet 0   ferrous gluconate  (FERGON) 324 MG tablet Take 1 tablet (324 mg total) by mouth daily with breakfast. 90 tablet 3   hydrochlorothiazide (HYDRODIURIL) 12.5 MG tablet Take 12.5 mg by mouth daily as needed (if blood pressure is greater than 128/82).  0   ipratropium (ATROVENT ) 0.06 % nasal spray Place 2 sprays into both nostrils 3 (three) times daily. 15 mL 5   levocetirizine (XYZAL) 5 MG tablet Take 5 mg by mouth every evening.     losartan (COZAAR) 25 MG  tablet Take 25 mg by mouth daily.     Magnesium  Glycinate 120 MG CAPS Take 1 capsule by mouth as directed. Taking for restless legs     Melatonin 10 MG TABS Take 10 mg by mouth at bedtime.     metoprolol  tartrate (LOPRESSOR ) 25 MG tablet Take 1/2 (one-half) tablet by mouth twice daily 60 tablet 0   naproxen sodium (ALEVE) 220 MG tablet Take 220-440 mg by mouth 2 (two) times daily as needed (pain.).     oxymetazoline (AFRIN) 0.05 % nasal spray Place 1 spray into both nostrils 2 (two) times daily.     predniSONE  (DELTASONE ) 10 MG tablet Take 6 tabs daily X 2 days, 5 tabs daily X 2 day, 4 tabs daily x 2 days, 3 tabs daily X 2 days, 2 tabs daily X 2 and 1 tab X 2 days. 42 tablet 0   psyllium (REGULOID) 0.52 g capsule Take 0.52 g by mouth daily.     RABEprazole  (ACIPHEX ) 20 MG tablet TAKE ONE TABLET BY MOUTH 30 TO 60 MINUTES BEFORE FIRST MEAL OF THE DAY 60 tablet 0   rosuvastatin (CRESTOR) 10 MG tablet Take 10 mg by mouth at bedtime.     VEOZAH 45 MG TABS Take 1 tablet by mouth daily.     Vitamin D , Ergocalciferol , (DRISDOL ) 1.25 MG (50000 UNIT) CAPS capsule Take by mouth.     No facility-administered medications prior to visit.  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      Objective:    Wts  01/17/2025        132  08/16/2024        132  07/11/2024        132  05/17/2024        131  04/05/2024        129  03/21/2024        127  01/23/2024        120  11/30/2023       116   10/21/23 122 lb (55.3 kg)  09/12/23 122 lb (55.3 kg)  08/10/23 128 lb (58.1 kg)    Vital signs reviewed  01/17/2025  - Note at rest 02 sats  95% on RA   General appearance:    amb wf classic voice  fatigue/ pseudoweeze is very promient over trachea    HEENT : Oropharynx  clear    HEENT : Oropharynx  clear/ mucosa dry/ edentulous   Nasal turbinates nl    NECK :  without  apparent JVD/ palpable Nodes/TM    LUNGS: no acc muscle use,  Min barrel  contour chest wall with bilateral  slightly decreased bs s audible wheeze and  without cough on insp or exp maneuvers and min  Hyperresonant  to  percussion bilaterally    CV:  RRR  no s3 or murmur or increase in P2, and no edema   ABD:  soft and nontender    MS:  Nl gait/ ext warm without deformities Or obvious joint restrictions  calf tenderness, cyanosis or clubbing     SKIN: warm and dry without lesions    NEURO:  alert, approp, nl sensorium with  no motor or cerebellar deficits apparent.             Assessment   Assessment & Plan Upper airway cough syndrome Onset ? 2023 / worse on trelegy 100 > d/c'd 09/09/23 -  max gerd rx 09/12/2023 >>> improved initially off trelegy until caught cold  -  referred to ENT 11/30/2023 >>> seen  01/02/24 c/w chronic sinusitis > CT sinus 01/11/24 1. Small bilateral maxillary sinus mucous retention cysts. Otherwise clear paranasal sinuses with patent sinus drainage pathways. 2. Leftward nasal septal deviation. Allergy  screen 01/23/2024 > Eos 0.3 / IgE 15  -  03/01/24 ENT eval c/w GERD -  07/11/2024 Prednisone  10 mg take  4 each am x 2 days,   2 each am x 2 days,  1 each am x 2 days and stop / Take delsym two tsp every 12 hours and supplement if needed with  tramadol  50 mg up to 2 every 4 hours to suppress the urge to cough. And start on gabapentin  titrated to as high as 1200 mg daily  - 08/16/2024  improved but still lots of nasal discharge > refer to allergy  at pt's request (note prev allergy  screen unremarkable ) > neg w/u  -  clear evidence of VCD  01/17/2025 >>>  rx tramadol  / pred x 6 and d/c breztri and  f/u 4 weeks  Upper airway  cough syndrome (previously labeled PNDS),  is so named because it's  frequently impossible to sort out how much is  CR/sinusitis with freq throat clearing (which can be related to primary GERD)   vs  causing  secondary ( extra esophageal)  GERD from wide swings in gastric pressure that occur with throat clearing, often  promoting self use of mint and menthol lozenges that reduce the lower esophageal sphincter tone and exacerbate the problem further in a cyclical fashion.   These are the same pts (now being labeled as having irritable larynx syndrome by some cough centers) who not infrequently have a history of having failed to tolerate ace inhibitors,  dry powder inhalers ( and sometimes any inhalers, esp high ICS content like breztri)  or biphosphonates or report having atypical/extraesophageal reflux symptoms from LPR (globus, throat clearing)  that don't respond to standard doses of PPI  and are easily confused as having aecopd or asthma flares by even experienced allergists/ pulmonologists (myself included).   /Of the three most common causes of  Sub-acute / recurrent or chronic cough, only one (GERD)  can actually contribute to/ trigger  the other two (asthma and post nasal drip syndrome)  and perpetuate the cylce of cough.  While not intuitively obvious, many patients with chronic low grade reflux do not cough until there is a primary insult that disturbs the protective epithelial barrier and exposes sensitive nerve endings.   This is typically viral but can due to PNDS and  either may apply here.     The point is that once this occurs, it is difficult to eliminate the cycle  using anything but a maximally effective acid suppression regimen at least in the short run, accompanied by an appropriate diet to address non acid GERD and control / eliminate the cough itself for at least 5 days with tramadol    >>> also added 6 day taper off  Prednisone  starting at 40 mg per day in case of component of Th-2 driven upper or lower airways inflammation (if cough responds short  term only to relapse before return while will on full rx for uacs (as above), then that would point to allergic rhinitis/ asthma or eos bronchitis as alternative dx)      COPD GOLD 0 Quit smoking 2017/ MM for alpha one  with dx of lung ca - PFT 02/22/23 >> FEV1 1.95 (96%), FEV1% 80, TLC 4.83 (108%), RV 2.54 (133%), DLCO 52%  worse p SABA and no curvature on inp/exp to indicate true airflow obstruction  - 09/12/2023  After extensive coaching inhaler device,  effectiveness =    80% with hfa so just rx as AB with albuterol  more appropriately  09/12/2023   Walked on RA  x  3  lap(s) =  approx 450  ft  @ mod pace, stopped due to end of study with lowest 02 sats 96%  p saba prior  - Allergy  screen 01/23/2024 >  Eos 0.3 /  IgE  15  alpha one phenotype  MM / evel 146  - 03/21/2024 change rx to alb/bud 0.25 mg bid as main cc = cough either due to AB or UACS  - 04/05/2024  After extensive coaching inhaler device,  effectiveness =    75% (short ti)  - 04/05/2024 flutter valve training  - 05/17/2024   Walked on RA  x  2  lap(s) =  approx 450  ft  @ mod pace, stopped due to end of sutdy  with lowest  02 sats 95% and mild sob    >>>  d/c breztri 01/17/2025 as main issue is UACS/ vcd and just rx with alb/ bud bid  and f/u in 4 weeks with all meds in hand using a trust but verify approach to confirm accurate Medication  Reconciliation The principal here is that until we are certain that the  patients are doing what we've asked, it makes no sense to ask them to do more.   Add certainly not a candidate for surgical intervention re new Dx RUL sq cell ca so >>>  will likely need another option like curative RT   Each maintenance medication was reviewed in detail including emphasizing most importantly the difference between maintenance and prns and under what circumstances the prns are to be triggered using an action plan format where appropriate.  Total time for H and P, chart review, counseling, reviewing hfa/ neb device(s)  and generating customized AVS unique to this Pope visit / same day charting = 45 min   For patient with multiple chronic  refractory respiratory  symptoms of uncertain etiology          AVS  Patient Instructions  Continue aciphex  Take 30-60 min before first meal of the day and pecpid 20 mg after supper   Elevate head of bed x 6-8 inchers (bed blocks or risers)    First take Mucinex DM  every 12 hours   and supplement if needed with  tramadol  50 mg up to 1- 2 every 4 hours to suppress even the urge to cough  or clear your throat. Swallowing water  or using ice chips/non mint and menthol containing candies (such as lifesavers or sugarless jolly ranchers) are also effective.  You should rest your voice and avoid activities that you know make you cough.  Once you have eliminated the cough for 3 straight days try reducing the tramadol  first,  then the Mucinex DM  as tolerated.    Prednisone  10 mg take  4 each am x 2 days,   2 each am x 2 days,  1 each am x 2 days and stop (this is to eliminate allergies and inflammation from coughing)   Stop breztri and continue duoneb (or plain albuterol ))  twice daily with budesonide  0.25 mg    Please schedule a follow up Pope visit in 4 weeks, sooner if needed  with all medications /inhalers/ solutions in hand so we can verify exactly what you are taking. This includes all medications from all doctors and over the counters             Ozell America, MD 01/17/2025                     "

## 2025-01-17 NOTE — Patient Instructions (Addendum)
 Continue aciphex  Take 30-60 min before first meal of the day and pecpid 20 mg after supper   Elevate head of bed x 6-8 inchers (bed blocks or risers)    First take Mucinex DM  every 12 hours   and supplement if needed with  tramadol  50 mg up to 1- 2 every 4 hours to suppress even the urge to cough  or clear your throat. Swallowing water  or using ice chips/non mint and menthol containing candies (such as lifesavers or sugarless jolly ranchers) are also effective.  You should rest your voice and avoid activities that you know make you cough.  Once you have eliminated the cough for 3 straight days try reducing the tramadol  first,  then the Mucinex DM  as tolerated.    Prednisone  10 mg take  4 each am x 2 days,   2 each am x 2 days,  1 each am x 2 days and stop (this is to eliminate allergies and inflammation from coughing)   Stop breztri and continue duoneb (or plain albuterol ))  twice daily with budesonide  0.25 mg    Please schedule a follow up office visit in 4 weeks, sooner if needed  with all medications /inhalers/ solutions in hand so we can verify exactly what you are taking. This includes all medications from all doctors and over the counters

## 2025-01-17 NOTE — Patient Instructions (Addendum)
 [facility] at [facility] Discharge Instructions   You were seen and examined today by Dr. Davonna.  She reviewed the results of your biopsy of the left upper lobe of your lung. It came back as squamous cell carcinoma (cancer).   She discussed treatment with you. We will refer you to radiation oncology. We will either do chemotherapy and radiation or just chemotherapy.    Return as scheduled.    Thank you for choosing [facility] at [facility] to provide your oncology and hematology care.  To afford each patient quality time with our provider, please arrive at least 15 minutes before your scheduled appointment time.   If you have a lab appointment with the Cancer Center please come in thru the Main Entrance and check in at the main information desk.  You need to re-schedule your appointment should you arrive 10 or more minutes late.  We strive to give you quality time with our providers, and arriving late affects you and other patients whose appointments are after yours.  Also, if you no show three or more times for appointments you may be dismissed from the clinic at the providers discretion.     Again, thank you for choosing [facility].  Our hope is that these requests will decrease the amount of time that you wait before being seen by our physicians.       _____________________________________________________________  Should you have questions after your visit to [facility], please contact our office at [phone] and follow the prompts.  Our office hours are 8:00 a.m. and 4:30 p.m. Monday - Friday.  Please note that voicemails left after 4:00 p.m. may not be returned until the following business day.  We are closed weekends and major holidays.  You do have access to a nurse 24-7, just call the main number to the clinic [phone] and do not press any options, hold on the line and a nurse will answer the phone.     For prescription refill requests, have your pharmacy contact our office and allow 72 hours.    Due to Covid, you will need to wear a mask upon entering the hospital. If you do not have a mask, a mask will be given to you at the Main Entrance upon arrival. For doctor visits, patients may have 1 support person age 74 or older with them. For treatment visits, patients can not have anyone with them due to social distancing guidelines and our immunocompromised population.

## 2025-01-17 NOTE — Assessment & Plan Note (Addendum)
 Quit smoking 2017/ MM for alpha one  with dx of lung ca - PFT 02/22/23 >> FEV1 1.95 (96%), FEV1% 80, TLC 4.83 (108%), RV 2.54 (133%), DLCO 52%  worse p SABA and no curvature on inp/exp to indicate true airflow obstruction  - 09/12/2023  After extensive coaching inhaler device,  effectiveness =    80% with hfa so just rx as AB with albuterol  more appropriately  09/12/2023   Walked on RA  x  3  lap(s) =  approx 450  ft  @ mod pace, stopped due to end of study with lowest 02 sats 96%  p saba prior  - Allergy  screen 01/23/2024 >  Eos 0.3 /  IgE  15  alpha one phenotype  MM / evel 146  - 03/21/2024 change rx to alb/bud 0.25 mg bid as main cc = cough either due to AB or UACS  - 04/05/2024  After extensive coaching inhaler device,  effectiveness =    75% (short ti)  - 04/05/2024 flutter valve training  - 05/17/2024   Walked on RA  x  2  lap(s) =  approx 450  ft  @ mod pace, stopped due to end of sutdy  with lowest 02 sats 95% and mild sob    >>>  d/c breztri 01/17/2025 as main issue is UACS/ vcd and just rx with alb/ bud bid  and f/u in 4 weeks with all meds in hand using a trust but verify approach to confirm accurate Medication  Reconciliation The principal here is that until we are certain that the  patients are doing what we've asked, it makes no sense to ask them to do more.   Add certainly not a candidate for surgical intervention re new Dx RUL sq cell ca so >>>  will likely need another option like curative RT   Each maintenance medication was reviewed in detail including emphasizing most importantly the difference between maintenance and prns and under what circumstances the prns are to be triggered using an action plan format where appropriate.  Total time for H and P, chart review, counseling, reviewing hfa/ neb device(s) and generating customized AVS unique to this office visit / same day charting = 45 min   For patient with multiple chronic  refractory respiratory  symptoms of uncertain etiology

## 2025-01-17 NOTE — Assessment & Plan Note (Addendum)
 Onset ? 2023 / worse on trelegy 100 > d/c'd 09/09/23 -  max gerd rx 09/12/2023 >>> improved initially off trelegy until caught cold  -  referred to ENT 11/30/2023 >>> seen  01/02/24 c/w chronic sinusitis > CT sinus 01/11/24 1. Small bilateral maxillary sinus mucous retention cysts. Otherwise clear paranasal sinuses with patent sinus drainage pathways. 2. Leftward nasal septal deviation. Allergy  screen 01/23/2024 > Eos 0.3 / IgE 15  -  03/01/24 ENT eval c/w GERD -  07/11/2024 Prednisone  10 mg take  4 each am x 2 days,   2 each am x 2 days,  1 each am x 2 days and stop / Take delsym two tsp every 12 hours and supplement if needed with  tramadol  50 mg up to 2 every 4 hours to suppress the urge to cough. And start on gabapentin  titrated to as high as 1200 mg daily  - 08/16/2024  improved but still lots of nasal discharge > refer to allergy  at pt's request (note prev allergy  screen unremarkable ) > neg w/u  -  clear evidence of VCD  01/17/2025 >>>  rx tramadol  / pred x 6 and d/c breztri and  f/u 4 weeks  Upper airway cough syndrome (previously labeled PNDS),  is so named because it's frequently impossible to sort out how much is  CR/sinusitis with freq throat clearing (which can be related to primary GERD)   vs  causing  secondary ( extra esophageal)  GERD from wide swings in gastric pressure that occur with throat clearing, often  promoting self use of mint and menthol lozenges that reduce the lower esophageal sphincter tone and exacerbate the problem further in a cyclical fashion.   These are the same pts (now being labeled as having irritable larynx syndrome by some cough centers) who not infrequently have a history of having failed to tolerate ace inhibitors,  dry powder inhalers ( and sometimes any inhalers, esp high ICS content like breztri)  or biphosphonates or report having atypical/extraesophageal reflux symptoms from LPR (globus, throat clearing)  that don't respond to standard doses of PPI  and are  easily confused as having aecopd or asthma flares by even experienced allergists/ pulmonologists (myself included).   /Of the three most common causes of  Sub-acute / recurrent or chronic cough, only one (GERD)  can actually contribute to/ trigger  the other two (asthma and post nasal drip syndrome)  and perpetuate the cylce of cough.  While not intuitively obvious, many patients with chronic low grade reflux do not cough until there is a primary insult that disturbs the protective epithelial barrier and exposes sensitive nerve endings.   This is typically viral but can due to PNDS and  either may apply here.     The point is that once this occurs, it is difficult to eliminate the cycle  using anything but a maximally effective acid suppression regimen at least in the short run, accompanied by an appropriate diet to address non acid GERD and control / eliminate the cough itself for at least 5 days with tramadol    >>> also added 6 day taper off  Prednisone  starting at 40 mg per day in case of component of Th-2 driven upper or lower airways inflammation (if cough responds short term only to relapse before return while will on full rx for uacs (as above), then that would point to allergic rhinitis/ asthma or eos bronchitis as alternative dx)

## 2025-01-22 LAB — CULTURE, FUNGUS WITHOUT SMEAR

## 2025-01-23 ENCOUNTER — Inpatient Hospital Stay: Admitting: Oncology

## 2025-01-25 ENCOUNTER — Ambulatory Visit: Admitting: Family Medicine

## 2025-01-27 NOTE — Telephone Encounter (Signed)
 Nurse placed call to patient to inform her that her appointment for tomorrow 2/2 has been moved to 1:00 PM to accommodate a delayed clinic opening due to inclement weather. Patient confirmed she will be able to make it for the 1:00 PM appointment. Nurse advised if she is unable to make it due to road conditions to call the office to reschedule. Patient acknowledged understanding.

## 2025-01-28 ENCOUNTER — Ambulatory Visit

## 2025-02-01 ENCOUNTER — Inpatient Hospital Stay

## 2025-02-01 ENCOUNTER — Inpatient Hospital Stay: Attending: Hematology | Admitting: Oncology

## 2025-02-01 ENCOUNTER — Other Ambulatory Visit: Payer: Self-pay

## 2025-02-01 VITALS — BP 102/76 | HR 87 | Temp 97.9°F | Resp 24 | Ht 60.0 in | Wt 131.5 lb

## 2025-02-01 DIAGNOSIS — C3492 Malignant neoplasm of unspecified part of left bronchus or lung: Secondary | ICD-10-CM

## 2025-02-01 DIAGNOSIS — J449 Chronic obstructive pulmonary disease, unspecified: Secondary | ICD-10-CM

## 2025-02-01 DIAGNOSIS — C3411 Malignant neoplasm of upper lobe, right bronchus or lung: Secondary | ICD-10-CM

## 2025-02-01 LAB — CBC WITH DIFFERENTIAL/PLATELET
Abs Immature Granulocytes: 0.09 10*3/uL — ABNORMAL HIGH (ref 0.00–0.07)
Basophils Absolute: 0.1 10*3/uL (ref 0.0–0.1)
Basophils Relative: 1 %
Eosinophils Absolute: 0.1 10*3/uL (ref 0.0–0.5)
Eosinophils Relative: 1 %
HCT: 40.4 % (ref 36.0–46.0)
Hemoglobin: 13.1 g/dL (ref 12.0–15.0)
Immature Granulocytes: 1 %
Lymphocytes Relative: 16 %
Lymphs Abs: 1.9 10*3/uL (ref 0.7–4.0)
MCH: 32.8 pg (ref 26.0–34.0)
MCHC: 32.4 g/dL (ref 30.0–36.0)
MCV: 101 fL — ABNORMAL HIGH (ref 80.0–100.0)
Monocytes Absolute: 1 10*3/uL (ref 0.1–1.0)
Monocytes Relative: 8 %
Neutro Abs: 8.8 10*3/uL — ABNORMAL HIGH (ref 1.7–7.7)
Neutrophils Relative %: 73 %
Platelets: 410 10*3/uL — ABNORMAL HIGH (ref 150–400)
RBC: 4 MIL/uL (ref 3.87–5.11)
RDW: 12.7 % (ref 11.5–15.5)
WBC: 11.9 10*3/uL — ABNORMAL HIGH (ref 4.0–10.5)
nRBC: 0 % (ref 0.0–0.2)

## 2025-02-01 LAB — COMPREHENSIVE METABOLIC PANEL WITH GFR
ALT: 14 U/L (ref 0–44)
AST: 19 U/L (ref 15–41)
Albumin: 4.5 g/dL (ref 3.5–5.0)
Alkaline Phosphatase: 127 U/L — ABNORMAL HIGH (ref 38–126)
Anion gap: 15 (ref 5–15)
BUN: 20 mg/dL (ref 8–23)
CO2: 24 mmol/L (ref 22–32)
Calcium: 9.6 mg/dL (ref 8.9–10.3)
Chloride: 100 mmol/L (ref 98–111)
Creatinine, Ser: 1.33 mg/dL — ABNORMAL HIGH (ref 0.44–1.00)
GFR, Estimated: 44 mL/min — ABNORMAL LOW
Glucose, Bld: 89 mg/dL (ref 70–99)
Potassium: 4.4 mmol/L (ref 3.5–5.1)
Sodium: 139 mmol/L (ref 135–145)
Total Bilirubin: 0.3 mg/dL (ref 0.0–1.2)
Total Protein: 7.6 g/dL (ref 6.5–8.1)

## 2025-02-01 NOTE — Progress Notes (Signed)
 " Patient Care Team: Rosamond Leta NOVAK, MD as PCP - General (Internal Medicine) Stacia Diannah SQUIBB, MD as PCP - Cardiology (Cardiology) Shaaron Lamar HERO, MD as Consulting Physician (Gastroenterology) [name] Ozell NOVAK, MD as Consulting Physician (Pulmonary Disease) Celestia Joesph SQUIBB, RN as Oncology Nurse Navigator (Medical Oncology) Davonna Siad, MD as Medical Oncologist (Medical Oncology)  Clinic Day:  02/01/2025  Referring physician: Rosamond Leta NOVAK, MD   CHIEF COMPLAINT:  CC: Non small cell lung carcinoma of left lung  H/O non small cell lung carcinoma of right lung   ASSESSMENT & PLAN:   Assessment & Plan: Emily Pope  is a 69 y.o. 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 - Patient had previous pneumonitis with Durvalumab  and still has side effects from it.  I will not prefer immunotherapy at this time. - Discussed treatment options with the patient including chemotherapy for 4-6 cycles followed by surveillance. - Considering patient has squamous cell carcinoma, will prefer carboplatin  with Abraxane  for the patient.  Discussed risk versus benefits of the treatment along with the side effects. -Carboplatin  commonly causes myelosuppression (leading to low white blood cells, red cells and platelets), nausea/vomiting, fatigue, and may also trigger allergic reactions or kidney changes. When combined with Abraxane  (albumin-bound paclitaxel ), frequent side effects include anemia, neutropenia, thrombocytopenia, peripheral neuropathy  (numbness/tingling), nausea, alopecia, and fatigue. Both drugs together can increase the risk of nerve damage and significant myelosuppression -Patient is in agreement to proceed with the treatment - Discussed risk versus benefits of port placement and patient is in agreement.  Will refer for IR guided port placement - Patient does not feel like she requires chemotherapy education since she got it previously -Will obtain baseline labs today  Return to clinic prior to second cycle to assess for tolerance.   Stage IIIb right non-small cell carcinoma-adenocarcinoma Oncology history above Initially diagnosed in 2017 post chemo RT, consolidation chemotherapy and consolidation durvalumab . Durvalumab  discontinued for pneumonitis after about 4 months of treatment Patient has no recurrence of the same spot at this time.   COPD Follows with Dr. [name] Patient quit smoking several years ago.   Dyspnea Chronic dyspnea. Symptoms limit exertion. Not on home oxygen , uses inhalers as needed.  - Assessed for home oxygen ; not indicated per patient    The patient understands the plans discussed today and is in agreement with them.  She knows to contact our office if she develops concerns prior to her next appointment.  40 minutes of total time was spent for this patient encounter, including preparation,face-to-face counseling with the patient and coordination of care, physical exam, and documentation of the encounter.   Siad Davonna, MD  [facility] [place] - A DEPT OF [facility] [address] [place] [address] Dept: [phone] Dept Fax: [phone]   No orders of the defined types were placed in this encounter.    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 Mass is confluent with the right superior hilum and right lower tracheal wall, suggestive of a T4 primary tumor. Postobstructive pneumonia in the right upper lobe. Hypermetabolic ipsilateral and contralateral mediastinal and contralateral hilar lymphadenopathy, suggestive of N3 nodal disease. Bilateral lower lobe pulmonary nodules, including a 5 mm contralateral lower lobe pulmonary nodule, cannot exclude pulmonary metastases. No osseous or extrathoracic metastatic disease. -08/23/2016: Bronchoscopy with RUL biopsy.  Pathology: Poorly differentiated non-small cell carcinoma. Immunohistochemistry shows positivity with thyroid  transcription factor-1 and negative staining with cytokeratin 5/6. The immunophenotype is consistent with poorly differentiated adenocarcinoma.  -08/23/2016: [facility]: PDL1 negative -09/10/2016: MRI Brain: NED -09/20/2016 - 10/25/2016: CRT with carboplatin  and paclitaxel  -11/15/2016 - 12/06/2016: 2 cycles of consolidative carboplatin  and paclitaxel  -01/25/2017 - 05/09/2017: Consolidation Durvalumab , discontinued due to pneumonitis -2019- current: Imaging showing stable disease.    Diagnosis: Left upper lobe squamous cell carcinoma   -11/27/2024: CT chest: Enlarging 1.1 x 0.8 cm spiculated anterior left upper lobe pulmonary nodule, suspicious for contralateral recurrence; recommend PET/CT and tissue sampling for further evaluation. -12/13/2024: Initial PET: The nodule of concern within the left upper lobe measures 1.0 cm nodule with mild tracer uptake (SUV max 2.0), increased in size and degree of fdg uptake compared with the prior exam. New 4 mm subpleural nodule in the periphery of the left upper lobe with SUV max of 2.6. Interval Increased tracer uptake within the left hilum with SUV max of 5.1, compared with 3.2 previously. Suspicious for residual or recurrent tumor within the  left hilum. Right paratracheal lymph node with SUV max of 4.9, previously 2.6. Suspicious for nodal metastasis. Within the area of right hilar radiation change, there is a 6 mm focus of increased radiotracer uptake anterior to the right mainstem bronchus with SUV max of 4.7, previously 3.8. cannot exclude local or recurrent tumor within an area of radiation change. No abnormal tracer uptake identified within the abdomen or pelvis. Trace left pleural effusion, new from previous exam. New asymmetric increased tracer uptake within the left tonsillar region with SUV max of 6.4. Etiology indeterminate. -01/01/2025: LUL biopsy and FNA of lymph nodes, right hilar mass, and LUL.  Pathology: Squamous cell carcinoma. Immunohistochemical stains show the cells are positive for p40 and are negative for TTF-1.  Cytology: Lymph nodes 11L and 4R positive for malignant cells consistent with non-small cell carcinoma. Right hilar mass positive for rare atypical suspicious for non-small cell carcinoma.  -01/07/2025: MRI Brain: NED - 01/15/2025: [id number] NGS: TMB: Not evaluable, MSI high: Not detected, no actionable mutations detected.  Current Treatment:  Planned carboplatin  and Abraxane  INTERVAL HISTORY:   Discussed the use of AI scribe software for clinical note transcription with the patient, who gave verbal consent to proceed.  History of Present Illness Emily Pope is a 68 year old female with recurrent non-small cell lung cancer of the left lung who presents for oncology follow-up and chemotherapy planning.  She reports significant fatigue and marked exertional dyspnea, describing herself as tired and stating she wears down really easy. She experiences shortness of breath with minimal activity, such as sweeping or changing clothes, and must pace herself to avoid symptoms. She does not use home oxygen  but manages with inhalers. Symptoms improve with rest and by avoiding rapid movement. She denies  numbness, tingling, or symptoms suggestive of neuropathy. She also notes a soft area in her chest, likely related to muscle  strain from chronic coughing, which becomes more noticeable when bending over and causes transient difficulty breathing upon standing upright. She denies symptoms of fluid accumulation or weight gain.  She has previously received chemotherapy education and does not feel she needs additional instruction. She expresses some fear regarding her prognosis.    I have reviewed the past medical history, past surgical history, social history and family history with the patient and they are unchanged from previous note.  ALLERGIES:  is allergic to clindamycin/lincomycin, codeine, lincomycin, other, penicillins, vancomycin , gabapentin , sulfa antibiotics, hydrocodone , levofloxacin , and tizanidine.  MEDICATIONS:  Current Outpatient Medications  Medication Sig Dispense Refill   acetaminophen  (TYLENOL  8 HOUR ARTHRITIS PAIN) 650 MG CR tablet Take 650 mg by mouth every 8 (eight) hours as needed for pain.     albuterol  (PROVENTIL  HFA;VENTOLIN  HFA) 108 (90 Base) MCG/ACT inhaler Inhale 2 puffs into the lungs every 6 (six) hours as needed for wheezing or shortness of breath. 1 Inhaler 6   albuterol  (PROVENTIL ) (2.5 MG/3ML) 0.083% nebulizer solution Take 3 mLs (2.5 mg total) by nebulization every 4 (four) hours as needed. 75 mL 12   aspirin EC 81 MG tablet Take 1 tablet by mouth daily.     azithromycin  (ZITHROMAX ) 250 MG tablet Take 2 tbs on day 1 and one tab daily until done. 6 each 0   budesonide  (PULMICORT ) 0.25 MG/2ML nebulizer solution Combine with albuterol  in nebulizer twice daily     buPROPion (WELLBUTRIN XL) 300 MG 24 hr tablet Take 300 mg by mouth daily.     Chlorpheniramine Maleate (CHLOR-TABLETS PO) Take by mouth as needed.     famotidine  (PEPCID ) 20 MG tablet TAKE 1 TABLET BY MOUTH ONCE DAILY AFTER SUPPER 30 tablet 0   ferrous gluconate  (FERGON) 324 MG tablet Take 1 tablet (324 mg  total) by mouth daily with breakfast. 90 tablet 3   hydrochlorothiazide (HYDRODIURIL) 12.5 MG tablet Take 12.5 mg by mouth daily as needed (if blood pressure is greater than 128/82).  0   losartan (COZAAR) 25 MG tablet Take 25 mg by mouth daily.     Magnesium  Glycinate 120 MG CAPS Take 1 capsule by mouth as directed. Taking for restless legs     Melatonin 10 MG TABS Take 10 mg by mouth at bedtime.     metoprolol  tartrate (LOPRESSOR ) 25 MG tablet Take 1/2 (one-half) tablet by mouth twice daily 60 tablet 0   naproxen sodium (ALEVE) 220 MG tablet Take 220-440 mg by mouth 2 (two) times daily as needed (pain.).     oxymetazoline (AFRIN) 0.05 % nasal spray Place 1 spray into both nostrils 2 (two) times daily.     predniSONE  (DELTASONE ) 10 MG tablet Take  4 each am x 2 days,   2 each am x 2 days,  1 each am x 2 days and stop 14 tablet 0   psyllium (REGULOID) 0.52 g capsule Take 0.52 g by mouth daily.     RABEprazole  (ACIPHEX ) 20 MG tablet TAKE ONE TABLET BY MOUTH 30 TO 60 MINUTES BEFORE FIRST MEAL OF THE DAY 60 tablet 0   rosuvastatin (CRESTOR) 10 MG tablet Take 10 mg by mouth at bedtime.     VEOZAH 45 MG TABS Take 1 tablet by mouth daily.     Vitamin D , Ergocalciferol , (DRISDOL ) 1.25 MG (50000 UNIT) CAPS capsule Take by mouth.     No current facility-administered medications for this visit.    VITALS:  Last menstrual period 08/04/1985.  Wt Readings from Last  3 Encounters:  01/17/25 134 lb 6.4 oz (61 kg)  01/17/25 132 lb (59.9 kg)  01/01/25 135 lb (61.2 kg)    There is no height or weight on file to calculate BMI.  Performance status (ECOG): 1 - Symptomatic but completely ambulatory  PHYSICAL EXAM:   GENERAL:alert, no distress and comfortable SKIN: skin color, texture, turgor are normal, no rashes or significant lesions LYMPH:  no palpable lymphadenopathy in the cervical, axillary or inguinal LUNGS: clear to auscultation and percussion with normal breathing effort HEART: regular rate &  rhythm and no murmurs and no lower extremity edema ABDOMEN:abdomen soft, non-tender and normal bowel sounds Musculoskeletal:no cyanosis of digits and no clubbing  NEURO: alert & oriented x 3 with fluent speech  LABORATORY DATA:  I have reviewed the data as listed     Component Value Date/Time   NA 140 11/27/2024 0834   NA 140 09/02/2016 1403   K 4.1 11/27/2024 0834   K 4.2 09/02/2016 1403   CL 101 11/27/2024 0834   CO2 25 11/27/2024 0834   CO2 27 09/02/2016 1403   GLUCOSE 88 11/27/2024 0834   GLUCOSE 92 09/02/2016 1403   BUN 21 11/27/2024 0834   BUN 15.6 09/02/2016 1403   CREATININE 1.26 (H) 11/27/2024 0834   CREATININE 0.8 09/02/2016 1403   CALCIUM 9.5 11/27/2024 0834   CALCIUM 9.6 09/02/2016 1403   PROT 6.9 11/27/2024 0834   PROT 8.0 09/02/2016 1403   ALBUMIN 4.3 11/27/2024 0834   ALBUMIN 3.6 09/02/2016 1403   AST 18 11/27/2024 0834   AST 12 09/02/2016 1403   ALT 14 11/27/2024 0834   ALT <9 09/02/2016 1403   ALKPHOS 122 11/27/2024 0834   ALKPHOS 159 (H) 09/02/2016 1403   BILITOT <0.2 11/27/2024 0834   BILITOT <0.30 09/02/2016 1403   GFRNONAA 47 (L) 11/27/2024 0834   GFRAA >60 06/09/2020 1259     Lab Results  Component Value Date   WBC 8.8 11/27/2024   NEUTROABS 6.3 11/27/2024   HGB 11.2 (L) 11/27/2024   HCT 34.6 (L) 11/27/2024   MCV 102.7 (H) 11/27/2024   PLT 328 11/27/2024      Chemistry      Component Value Date/Time   NA 140 11/27/2024 0834   NA 140 09/02/2016 1403   K 4.1 11/27/2024 0834   K 4.2 09/02/2016 1403   CL 101 11/27/2024 0834   CO2 25 11/27/2024 0834   CO2 27 09/02/2016 1403   BUN 21 11/27/2024 0834   BUN 15.6 09/02/2016 1403   CREATININE 1.26 (H) 11/27/2024 0834   CREATININE 0.8 09/02/2016 1403      Component Value Date/Time   CALCIUM 9.5 11/27/2024 0834   CALCIUM 9.6 09/02/2016 1403   ALKPHOS 122 11/27/2024 0834   ALKPHOS 159 (H) 09/02/2016 1403   AST 18 11/27/2024 0834   AST 12 09/02/2016 1403   ALT 14 11/27/2024 0834   ALT  <9 09/02/2016 1403   BILITOT <0.2 11/27/2024 0834   BILITOT <0.30 09/02/2016 1403        RADIOGRAPHIC STUDIES: I have personally reviewed the radiological images as listed and agreed with the findings in the report.  "

## 2025-02-01 NOTE — Addendum Note (Signed)
 Addended by: Arwilda Georgia on: 02/01/2025 02:32 PM   Modules accepted: Orders

## 2025-02-04 ENCOUNTER — Ambulatory Visit (HOSPITAL_COMMUNITY)

## 2025-02-06 ENCOUNTER — Inpatient Hospital Stay: Admitting: Licensed Clinical Social Worker

## 2025-02-07 ENCOUNTER — Inpatient Hospital Stay

## 2025-02-13 ENCOUNTER — Other Ambulatory Visit (HOSPITAL_COMMUNITY)

## 2025-02-13 ENCOUNTER — Ambulatory Visit (HOSPITAL_COMMUNITY)

## 2025-02-14 ENCOUNTER — Ambulatory Visit: Admitting: Internal Medicine

## 2025-02-18 ENCOUNTER — Inpatient Hospital Stay

## 2025-02-18 ENCOUNTER — Ambulatory Visit

## 2025-02-25 ENCOUNTER — Institutional Professional Consult (permissible substitution) (INDEPENDENT_AMBULATORY_CARE_PROVIDER_SITE_OTHER)

## 2025-03-11 ENCOUNTER — Inpatient Hospital Stay: Admitting: Oncology

## 2025-03-11 ENCOUNTER — Inpatient Hospital Stay: Admitting: Dietician

## 2025-03-11 ENCOUNTER — Inpatient Hospital Stay

## 2025-03-11 ENCOUNTER — Inpatient Hospital Stay: Attending: Hematology
